# Patient Record
Sex: Male | Born: 1965
Health system: Southern US, Community
[De-identification: ages and names within clinical notes are randomized; demographics above are authoritative.]

## PROBLEM LIST (undated history)

## (undated) DIAGNOSIS — I10 Essential (primary) hypertension: Secondary | ICD-10-CM

## (undated) DIAGNOSIS — M1711 Unilateral primary osteoarthritis, right knee: Secondary | ICD-10-CM

## (undated) DIAGNOSIS — E119 Type 2 diabetes mellitus without complications: Secondary | ICD-10-CM

## (undated) DIAGNOSIS — J45909 Unspecified asthma, uncomplicated: Secondary | ICD-10-CM

## (undated) DIAGNOSIS — M1990 Unspecified osteoarthritis, unspecified site: Secondary | ICD-10-CM

## (undated) HISTORY — PX: CYSTECTOMY: SHX5119

## (undated) HISTORY — PX: OTHER SURGICAL HISTORY: SHX169

## (undated) HISTORY — PX: TONSILLECTOMY: SUR1361

## (undated) HISTORY — PX: SINUS EXPLORATION: SHX5214

## (undated) HISTORY — DX: Unspecified asthma, uncomplicated: J45.909

## (undated) HISTORY — PX: KNEE ARTHROSCOPY W/ MENISCAL REPAIR: SHX1877

---

## 2013-11-09 DIAGNOSIS — J302 Other seasonal allergic rhinitis: Secondary | ICD-10-CM | POA: Insufficient documentation

## 2013-11-09 HISTORY — DX: Other seasonal allergic rhinitis: J30.2

## 2013-12-15 DIAGNOSIS — M25369 Other instability, unspecified knee: Secondary | ICD-10-CM | POA: Insufficient documentation

## 2016-10-21 ENCOUNTER — Ambulatory Visit (INDEPENDENT_AMBULATORY_CARE_PROVIDER_SITE_OTHER): Payer: Self-pay | Admitting: Family Medicine

## 2016-10-21 ENCOUNTER — Encounter: Payer: Self-pay | Admitting: Family Medicine

## 2016-10-21 VITALS — BP 176/98 | HR 93 | Temp 98.1°F | Resp 16 | Ht 76.0 in | Wt 347.0 lb

## 2016-10-21 DIAGNOSIS — R739 Hyperglycemia, unspecified: Secondary | ICD-10-CM | POA: Insufficient documentation

## 2016-10-21 DIAGNOSIS — Z0000 Encounter for general adult medical examination without abnormal findings: Secondary | ICD-10-CM

## 2016-10-21 DIAGNOSIS — R8290 Unspecified abnormal findings in urine: Secondary | ICD-10-CM

## 2016-10-21 DIAGNOSIS — N390 Urinary tract infection, site not specified: Secondary | ICD-10-CM

## 2016-10-21 DIAGNOSIS — I10 Essential (primary) hypertension: Secondary | ICD-10-CM

## 2016-10-21 DIAGNOSIS — Z23 Encounter for immunization: Secondary | ICD-10-CM

## 2016-10-21 DIAGNOSIS — Z9109 Other allergy status, other than to drugs and biological substances: Secondary | ICD-10-CM

## 2016-10-21 DIAGNOSIS — E785 Hyperlipidemia, unspecified: Secondary | ICD-10-CM | POA: Insufficient documentation

## 2016-10-21 DIAGNOSIS — E559 Vitamin D deficiency, unspecified: Secondary | ICD-10-CM | POA: Insufficient documentation

## 2016-10-21 DIAGNOSIS — R7303 Prediabetes: Secondary | ICD-10-CM

## 2016-10-21 LAB — POCT URINALYSIS DIP (DEVICE)
Bilirubin Urine: NEGATIVE
GLUCOSE, UA: NEGATIVE mg/dL
Hgb urine dipstick: NEGATIVE
Ketones, ur: NEGATIVE mg/dL
NITRITE: POSITIVE — AB
PH: 5.5 (ref 5.0–8.0)
PROTEIN: 30 mg/dL — AB
Specific Gravity, Urine: 1.025 (ref 1.005–1.030)
UROBILINOGEN UA: 0.2 mg/dL (ref 0.0–1.0)

## 2016-10-21 LAB — POCT GLYCOSYLATED HEMOGLOBIN (HGB A1C): Hemoglobin A1C: 6.3

## 2016-10-21 MED ORDER — CLONIDINE HCL 0.1 MG PO TABS
0.1000 mg | ORAL_TABLET | Freq: Once | ORAL | Status: AC
  Administered 2016-10-21: 0.1 mg via ORAL

## 2016-10-21 MED ORDER — AMLODIPINE BESYLATE 5 MG PO TABS: 5.0000 mg | ORAL_TABLET | Freq: Every day | ORAL | 2 refills | Status: DC

## 2016-10-21 MED ORDER — CIPROFLOXACIN HCL 250 MG PO TABS: 250.0000 mg | ORAL_TABLET | Freq: Two times a day (BID) | ORAL | 0 refills | Status: DC

## 2016-10-21 MED ORDER — ASPIRIN EC 81 MG PO TBEC: 81.0000 mg | DELAYED_RELEASE_TABLET | Freq: Every day | ORAL | 11 refills | Status: DC

## 2016-10-21 MED ORDER — ATORVASTATIN CALCIUM 20 MG PO TABS: 20.0000 mg | ORAL_TABLET | Freq: Every day | ORAL | 3 refills | Status: DC

## 2016-10-21 MED FILL — CIPROFLOXACIN HCL 250 MG TA: 250 | 7 days supply | Qty: 14 | Fill #0

## 2016-10-21 MED FILL — AMLODIPINE BESYLATE 5 MG TA: 5 | 30 days supply | Qty: 30 | Fill #0

## 2016-10-21 MED FILL — ?ATORVASTATIN 20 MG TABLET: 20 | 30 days supply | Qty: 30 | Fill #0

## 2016-10-21 NOTE — Progress Notes (Signed)
Mr. Elliot DallyDavid Zachman, a 51 year old male, presents to establish care. He says that he has not had a primary provider in greater than 5 years. He has a history of hypertension, hyperlipidemia, and obesity. He was recently evaluated at a pre employment health screening and found to have a markedly elevated blood pressure at 170/94 and his cholesterol was elevated. Patient brought a copy of abnormal laboratory values. He has not been followed by primarily care since relocating due to insurance constraints. He says that he has been out of work and has not been able to afford healthier food options. He maintains that he has gained a considerable amount of weight over the past year .He is not exercising and is not adherent to low salt diet. Mr. [name] does not check blood pressure at home. Patient denies chest pain, dyspnea, fatigue, lower extremity edema, palpitations, syncope and tachypnea.  Cardiovascular risk factors include: dyslipidemia, hypertension, obesity (BMI >= 30 kg/m2) and smoking/ tobacco exposure. He has been a chronic everyday smoker for 30 years.   Past Medical History:  Diagnosis Date  . Asthma    Social History   Social History  . Marital status: Single    Spouse name: N/A  . Number of children: N/A  . Years of education: N/A   Occupational History  . Not on file.   Social History Main Topics  . Smoking status: Former Games developermoker  . Smokeless tobacco: Never Used  . Alcohol use No  . Drug use: No  . Sexual activity: Yes   Other Topics Concern  . Not on file   Social History Narrative  . No narrative on file     Review of Systems  Constitutional:       Mr. [name] reports weight gain  HENT: Negative.   Eyes: Negative.   Respiratory: Negative.   Cardiovascular: Negative.  Negative for chest pain, palpitations and leg swelling.  Gastrointestinal: Negative.   Genitourinary: Negative.  Negative for dysuria, hematuria and urgency.  Musculoskeletal: Negative.   Skin: Negative.    Neurological: Negative.   Endo/Heme/Allergies: Negative.   Psychiatric/Behavioral: Negative.  Negative for depression.    Physical Exam  Constitutional: He is oriented to person, place, and time and well-developed, well-nourished, and in no distress.  Morbid obesity  HENT:  Head: Normocephalic and atraumatic.  Right Ear: External ear normal.  Left Ear: External ear normal.  Nose: Nose normal.  Mouth/Throat: Oropharynx is clear and moist.  Eyes: Conjunctivae and EOM are normal. Pupils are equal, round, and reactive to light.  Neck: Normal range of motion. Neck supple.  Cardiovascular: Normal rate, regular rhythm, normal heart sounds and intact distal pulses.   Pulmonary/Chest: No respiratory distress. He has no wheezes.  Abdominal: Soft. Bowel sounds are normal.  Increased abdominal girth  Musculoskeletal: Normal range of motion.  Neurological: He is alert and oriented to person, place, and time. Gait normal.  Skin: Skin is warm and dry.    Plan   1. Essential hypertension Blood pressure is 188/108 on arrival. Patient was given Clonidine 0.1 mg and blood pressure decreased to 177/98. I will start Amlodipine 5 mg daily. He will return for blood pressure check in 1 week. Recommend a low fat, low sodium diet.  - cloNIDine (CATAPRES) tablet 0.1 mg; Take 1 tablet (0.1 mg total) by mouth once. - amLODipine (NORVASC) 5 MG tablet; Take 1 tablet (5 mg total) by mouth daily.  Dispense: 30 tablet; Refill: 2 - Microalbumin/Creatinine Ratio, Urine - POCT urinalysis  dip (device)  2. Hyperlipidemia LDL goal <100 The patient is asked to make an attempt to improve diet and exercise patterns to aid in medical management of this problem. - atorvastatin (LIPITOR) 20 MG tablet; Take 1 tablet (20 mg total) by mouth daily.  Dispense: 90 tablet; Refill: 3 - aspirin EC 81 MG tablet; Take 1 tablet (81 mg total) by mouth daily.  Dispense: 30 tablet; Refill: 11 - Microalbumin/Creatinine Ratio, Urine  3.  Elevated serum glucose - HgB A1c  4. Prediabetes Recommend a lowfat, low carbohydrate diet divided over 5-6 small meals, increase water intake to 6-8 glasses, and 150 minutes per week of cardiovascular exercise.   - HgB A1c  5. Vitamin D deficiency He has a history of vitamin D deficiency. I will check vitamin D level - cholecalciferol (VITAMIN D) 1000 units tablet; Take 1,000 Units by mouth daily. - Vitamin D 1,25 dihydroxy  6. Need for Tdap vaccination - Tdap vaccine greater than or equal to 7yo IM  7. Morbid obesity (HCC) Recommend a lowfat, low carbohydrate diet divided over 5-6 small meals, increase water intake to 6-8 glasses, and 150 minutes per week of cardiovascular exercise.   - HgB A1c  8. Abnormal urinalysis Patient has positive nitrites and small leukocytes.  - Urine Culture  9. Urinary tract infection without hematuria, site unspecified Abnormal urinalysis. He has positive nitrites and leukocytes. I will treat empirically with broad spectrum antibiotics - Urine Culture - ciprofloxacin (CIPRO) 250 MG tablet; Take 1 tablet (250 mg total) by mouth 2 (two) times daily.  Dispense: 14 tablet; Refill: 0  10. Environmental allergies - Fexofenadine HCl (ALLEGRA ALLERGY PO); Take by mouth.  11. Preventative health care - Ascorbic Acid (VITAMIN C) 100 MG tablet; Take 100 mg by mouth daily.   RTC: Mr. Stickels will return to clinic in 1 week for a blood pressure check and in 1 month for hypertension.   Nolon Nations  MSN, FNP-C [facility] Patient [facility] [address] [place], [place] [address] [phone]

## 2016-10-21 NOTE — Patient Instructions (Addendum)
1. Hyperlipidemia:  Will start low dose statin therapy 20 mg with dinner Aspirin 81 mg daily  2. Hypertension 0.1 mg Clonidine times 1 without complication Amlodipine 5 mg daily 3. Prediabetes Recommend a lowfat, low carbohydrate diet divided over 5-6 small meals, increase water intake to 6-8 glasses, and 150 minutes per week of cardiovascular exercise.   4. Urinary Tract Infection Ciprofloxacin 250 mg twice daily for 7 days Increase water intake to 6-8 glasses per day    DASH Eating Plan DASH stands for "Dietary Approaches to Stop Hypertension." The DASH eating plan is a healthy eating plan that has been shown to reduce high blood pressure (hypertension). It may also reduce your risk for type 2 diabetes, heart disease, and stroke. The DASH eating plan may also help with weight loss. What are tips for following this plan? General guidelines  Avoid eating more than 2,300 mg (milligrams) of salt (sodium) a day. If you have hypertension, you may need to reduce your sodium intake to 1,500 mg a day.  Limit alcohol intake to no more than 1 drink a day for nonpregnant women and 2 drinks a day for men. One drink equals 12 oz of beer, 5 oz of wine, or 1 oz of hard liquor.  Work with your health care provider to maintain a healthy body weight or to lose weight. Ask what an ideal weight is for you.  Get at least 30 minutes of exercise that causes your heart to beat faster (aerobic exercise) most days of the week. Activities may include walking, swimming, or biking.  Work with your health care provider or diet and nutrition specialist (dietitian) to adjust your eating plan to your individual calorie needs. Reading food labels  Check food labels for the amount of sodium per serving. Choose foods with less than 5 percent of the Daily Value of sodium. Generally, foods with less than 300 mg of sodium per serving fit into this eating plan.  To find whole grains, look for the word "whole" as the  first word in the ingredient list. Shopping  Buy products labeled as "low-sodium" or "no salt added."  Buy fresh foods. Avoid canned foods and premade or frozen meals. Cooking  Avoid adding salt when cooking. Use salt-free seasonings or herbs instead of table salt or sea salt. Check with your health care provider or pharmacist before using salt substitutes.  Do not fry foods. Cook foods using healthy methods such as baking, boiling, grilling, and broiling instead.  Cook with heart-healthy oils, such as olive, canola, soybean, or sunflower oil. Meal planning   Eat a balanced diet that includes: ? 5 or more servings of fruits and vegetables each day. At each meal, try to fill half of your plate with fruits and vegetables. ? Up to 6-8 servings of whole grains each day. ? Less than 6 oz of lean meat, poultry, or fish each day. A 3-oz serving of meat is about the same size as a deck of cards. One egg equals 1 oz. ? 2 servings of low-fat dairy each day. ? A serving of nuts, seeds, or beans 5 times each week. ? Heart-healthy fats. Healthy fats called Omega-3 fatty acids are found in foods such as flaxseeds and coldwater fish, like sardines, salmon, and mackerel.  Limit how much you eat of the following: ? Canned or prepackaged foods. ? Food that is high in trans fat, such as fried foods. ? Food that is high in saturated fat, such as fatty meat. ?  Sweets, desserts, sugary drinks, and other foods with added sugar. ? Full-fat dairy products.  Do not salt foods before eating.  Try to eat at least 2 vegetarian meals each week.  Eat more home-cooked food and less restaurant, buffet, and fast food.  When eating at a restaurant, ask that your food be prepared with less salt or no salt, if possible. What foods are recommended? The items listed may not be a complete list. Talk with your dietitian about what dietary choices are best for you. Grains Whole-grain or whole-wheat bread. Whole-grain  or whole-wheat pasta. Brown rice. [place]. Bulgur. Whole-grain and low-sodium cereals. Pita bread. Low-fat, low-sodium crackers. Whole-wheat flour tortillas. Vegetables Fresh or frozen vegetables (raw, steamed, roasted, or grilled). Low-sodium or reduced-sodium tomato and vegetable juice. Low-sodium or reduced-sodium tomato sauce and tomato paste. Low-sodium or reduced-sodium canned vegetables. Fruits All fresh, dried, or frozen fruit. Canned fruit in natural juice (without added sugar). Meat and other protein foods Skinless chicken or [place]. Ground chicken or [place]. Pork with fat trimmed off. Fish and seafood. Egg whites. Dried beans, peas, or lentils. Unsalted nuts, nut butters, and seeds. Unsalted canned beans. Lean cuts of beef with fat trimmed off. Low-sodium, lean deli meat. Dairy Low-fat (1%) or fat-free (skim) milk. Fat-free, low-fat, or reduced-fat cheeses. Nonfat, low-sodium ricotta or cottage cheese. Low-fat or nonfat yogurt. Low-fat, low-sodium cheese. Fats and oils Soft margarine without trans fats. Vegetable oil. Low-fat, reduced-fat, or light mayonnaise and salad dressings (reduced-sodium). Canola, safflower, olive, soybean, and sunflower oils. Avocado. Seasoning and other foods Herbs. Spices. Seasoning mixes without salt. Unsalted popcorn and pretzels. Fat-free sweets. What foods are not recommended? The items listed may not be a complete list. Talk with your dietitian about what dietary choices are best for you. Grains Baked goods made with fat, such as croissants, muffins, or some breads. Dry pasta or rice meal packs. Vegetables Creamed or fried vegetables. Vegetables in a cheese sauce. Regular canned vegetables (not low-sodium or reduced-sodium). Regular canned tomato sauce and paste (not low-sodium or reduced-sodium). Regular tomato and vegetable juice (not low-sodium or reduced-sodium). Angie Fava. Olives. Fruits Canned fruit in a light or heavy syrup. Fried fruit.  Fruit in cream or butter sauce. Meat and other protein foods Fatty cuts of meat. Ribs. Fried meat. Berniece Salines. Sausage. Bologna and other processed lunch meats. Salami. Fatback. Hotdogs. Bratwurst. Salted nuts and seeds. Canned beans with added salt. Canned or smoked fish. Whole eggs or egg yolks. Chicken or [place] with skin. Dairy Whole or 2% milk, cream, and half-and-half. Whole or full-fat cream cheese. Whole-fat or sweetened yogurt. Full-fat cheese. Nondairy creamers. Whipped toppings. Processed cheese and cheese spreads. Fats and oils Butter. Stick margarine. Lard. Shortening. Ghee. Bacon fat. Tropical oils, such as coconut, palm kernel, or palm oil. Seasoning and other foods Salted popcorn and pretzels. Onion salt, garlic salt, seasoned salt, table salt, and sea salt. Worcestershire sauce. Tartar sauce. Barbecue sauce. Teriyaki sauce. Soy sauce, including reduced-sodium. Steak sauce. Canned and packaged gravies. Fish sauce. Oyster sauce. Cocktail sauce. Horseradish that you find on the shelf. Ketchup. Mustard. Meat flavorings and tenderizers. Bouillon cubes. Hot sauce and Tabasco sauce. Premade or packaged marinades. Premade or packaged taco seasonings. Relishes. Regular salad dressings. Where to find more information:  National Heart, Lung, and [place]: https://wilson-eaton.com/  American Heart Association: www.heart.org Summary  The DASH eating plan is a healthy eating plan that has been shown to reduce high blood pressure (hypertension). It may also reduce your risk for type 2 diabetes,  heart disease, and stroke.  With the DASH eating plan, you should limit salt (sodium) intake to 2,300 mg a day. If you have hypertension, you may need to reduce your sodium intake to 1,500 mg a day.  When on the DASH eating plan, aim to eat more fresh fruits and vegetables, whole grains, lean proteins, low-fat dairy, and heart-healthy fats.  Work with your health care provider or diet and nutrition  specialist (dietitian) to adjust your eating plan to your individual calorie needs. This information is not intended to replace advice given to you by your health care provider. Make sure you discuss any questions you have with your health care provider. Document Released: 03/27/2011 Document Revised: 03/31/2016 Document Reviewed: 03/31/2016 Elsevier Interactive Patient Education  2017 [employer] Dyslipidemia Dyslipidemia is an imbalance of waxy, fat-like substances (lipids) in the blood. The body needs lipids in small amounts. Dyslipidemia often involves a high level of cholesterol or triglycerides, which are types of lipids. Common forms of dyslipidemia include:  High levels of bad cholesterol (LDL cholesterol). LDL is the type of cholesterol that causes fatty deposits (plaques) to build up in the blood vessels that carry blood away from your heart (arteries).  Low levels of good cholesterol (HDL cholesterol). HDL cholesterol is the type of cholesterol that protects against heart disease. High levels of HDL remove the LDL buildup from arteries.  High levels of triglycerides. Triglycerides are a fatty substance in the blood that is linked to a buildup of plaques in the arteries.  You can develop dyslipidemia because of the genes you are born with (primary dyslipidemia) or changes that occur during your life (secondary dyslipidemia), or as a side effect of certain medical treatments. What are the causes? Primary dyslipidemia is caused by changes (mutations) in genes that are passed down through families (inherited). These mutations cause several types of dyslipidemia. Mutations can result in disorders that make the body produce too much LDL cholesterol or triglycerides, or not enough HDL cholesterol. These disorders may lead to heart disease, arterial disease, or stroke at an early age. Causes of secondary dyslipidemia include certain lifestyle choices and diseases that lead to dyslipidemia,  such as:  Eating a diet that is high in animal fat.  Not getting enough activity or exercise (having a sedentary lifestyle).  Having diabetes, kidney disease, liver disease, or thyroid disease.  Drinking large amounts of alcohol.  Using certain types of drugs.  What increases the risk? You may be at greater risk for dyslipidemia if you are an older man or if you are a woman who has gone through menopause. Other risk factors include:  Having a family history of dyslipidemia.  Taking certain medicines, including birth control pills, steroids, some diuretics, beta-blockers, and some medicines forHIV.  Smoking cigarettes.  Eating a high-fat diet.  Drinking large amounts of alcohol.  Having certain medical conditions such as diabetes, polycystic ovary syndrome (PCOS), pregnancy, kidney disease, liver disease, or hypothyroidism.  Not exercising regularly.  Being overweight or obese with too much belly fat.  What are the signs or symptoms? Dyslipidemia does not usually cause any symptoms. Very high lipid levels can cause fatty bumps under the skin (xanthomas) or a white or gray ring around the black center (pupil) of the eye. Very high triglyceride levels can cause inflammation of the pancreas (pancreatitis). How is this diagnosed? Your health care provider may diagnose dyslipidemia based on a routine blood test (fasting blood test). Because most people do not have symptoms of the  condition, this blood testing (lipid profile) is done on adults age 32 and older and is repeated every 5 years. This test checks:  Total cholesterol. This is a measure of the total amount of cholesterol in your blood, including LDL cholesterol, HDL cholesterol, and triglycerides. A healthy number is below 200.  LDL cholesterol. The target number for LDL cholesterol is different for each person, depending on individual risk factors. For most people, a number below 100 is healthy. Ask your health care  provider what your LDL cholesterol number should be.  HDL cholesterol. An HDL level of 60 or higher is best because it helps to protect against heart disease. A number below 40 for men or below 50 for women increases the risk for heart disease.  Triglycerides. A healthy triglyceride number is below 150.  If your lipid profile is abnormal, your health care provider may do other blood tests to get more information about your condition. How is this treated? Treatment depends on the type of dyslipidemia that you have and your other risk factors for heart disease and stroke. Your health care provider will have a target range for your lipid levels based on this information. For many people, treatment starts with lifestyle changes, such as diet and exercise. Your health care provider may recommend that you:  Get regular exercise.  Make changes to your diet.  Quit smoking if you smoke.  If diet changes and exercise do not help you reach your goals, your health care provider may also prescribe medicine to lower lipids. The most commonly prescribed type of medicine lowers your LDL cholesterol (statin drug). If you have a high triglyceride level, your provider may prescribe another type of drug (fibrate) or an omega-3 fish oil supplement, or both. Follow these instructions at home:  Take over-the-counter and prescription medicines only as told by your health care provider. This includes supplements.  Get regular exercise. Start an aerobic exercise and strength training program as told by your health care provider. Ask your health care provider what activities are safe for you. Your health care provider may recommend: ? 30 minutes of aerobic activity 4-6 days a week. Brisk walking is an example of aerobic activity. ? Strength training 2 days a week.  Eat a healthy diet as told by your health care provider. This can help you reach and maintain a healthy weight, lower your LDL cholesterol, and raise your  HDL cholesterol. It may help to work with a diet and nutrition specialist (dietitian) to make a plan that is right for you. Your dietitian or health care provider may recommend: ? Limiting your calories, if you are overweight. ? Eating more fruits, vegetables, whole grains, fish, and lean meats. ? Limiting saturated fat, trans fat, and cholesterol.  Follow instructions from your health care provider or dietitian about eating or drinking restrictions.  Limit alcohol intake to no more than one drink per day for nonpregnant women and two drinks per day for men. One drink equals 12 oz of beer, 5 oz of wine, or 1 oz of hard liquor.  Do not use any products that contain nicotine or tobacco, such as cigarettes and e-cigarettes. If you need help quitting, ask your health care provider.  Keep all follow-up visits as told by your health care provider. This is important. Contact a health care provider if:  You are having trouble sticking to your exercise or diet plan.  You are struggling to quit smoking or control your use of alcohol. Summary  Dyslipidemia is an imbalance of waxy, fat-like substances (lipids) in the blood. The body needs lipids in small amounts. Dyslipidemia often involves a high level of cholesterol or triglycerides, which are types of lipids.  Treatment depends on the type of dyslipidemia that you have and your other risk factors for heart disease and stroke.  For many people, treatment starts with lifestyle changes, such as diet and exercise. Your health care provider may also prescribe medicine to lower lipids. This information is not intended to replace advice given to you by your health care provider. Make sure you discuss any questions you have with your health care provider. Document Released: 04/12/2013 Document Revised: 12/03/2015 Document Reviewed: 12/03/2015 Elsevier Interactive Patient Education  2018 [employer].     Prediabetes Prediabetes is the condition of  having a blood sugar (blood glucose) level that is higher than it should be, but not high enough for you to be diagnosed with type 2 diabetes. Having prediabetes puts you at risk for developing type 2 diabetes (type 2 diabetes mellitus). Prediabetes may be called impaired glucose tolerance or impaired fasting glucose. Prediabetes usually does not cause symptoms. Your health care provider can diagnose this condition with blood tests. You may be tested for prediabetes if you are overweight and if you have at least one other risk factor for prediabetes. Risk factors for prediabetes include:  Having a family member with type 2 diabetes.  Being overweight or obese.  Being older than age 2.  Being of American-Indian, African-American, Hispanic/Latino, or Asian/Pacific Islander descent.  Having an inactive (sedentary) lifestyle.  Having a history of gestational diabetes or polycystic ovarian syndrome (PCOS).  Having low levels of good cholesterol (HDL-C) or high levels of blood fats (triglycerides).  Having high blood pressure.  What is blood glucose and how is blood glucose measured?  Blood glucose refers to the amount of glucose in your bloodstream. Glucose comes from eating foods that contain sugars and starches (carbohydrates) that the body breaks down into glucose. Your blood glucose level may be measured in mg/dL (milligrams per deciliter) or mmol/L (millimoles per liter).Your blood glucose may be checked with one or more of the following blood tests:  A fasting blood glucose (FBG) test. You will not be allowed to eat (you will fast) for at least 8 hours before a blood sample is taken. ? A normal range for FBG is 70-100 mg/dl (6.9-6.2 mmol/L).  An A1c (hemoglobin A1c) blood test. This test provides information about blood glucose control over the previous 2?3months.  An oral glucose tolerance test (OGTT). This test measures your blood glucose twice: ? After fasting. This is your  baseline level. ? Two hours after you drink a beverage that contains glucose.  You may be diagnosed with prediabetes:  If your FBG is 100?125 mg/dL (9.5-2.8 mmol/L).  If your A1c level is 5.7?6.4%.  If your OGGT result is 140?199 mg/dL (4.1-32 mmol/L).  These blood tests may be repeated to confirm your diagnosis. What happens if blood glucose is too high? The pancreas produces a hormone (insulin) that helps move glucose from the bloodstream into cells. When cells in the body do not respond properly to insulin that the body makes (insulin resistance), excess glucose builds up in the blood instead of going into cells. As a result, high blood glucose (hyperglycemia) can develop, which can cause many complications. This is a symptom of prediabetes. What can happen if blood glucose stays higher than normal for a long time? Having high blood glucose  for a long time is dangerous. Too much glucose in your blood can damage your nerves and blood vessels. Long-term damage can lead to complications from diabetes, which may include:  Heart disease.  Stroke.  Blindness.  Kidney disease.  Depression.  Poor circulation in the feet and legs, which could lead to surgical removal (amputation) in severe cases.  How can prediabetes be prevented from turning into type 2 diabetes?  To help prevent type 2 diabetes, take the following actions:  Be physically active. ? Do moderate-intensity physical activity for at least 30 minutes on at least 5 days of the week, or as much as told by your health care provider. This could be brisk walking, biking, or water aerobics. ? Ask your health care provider what activities are safe for you. A mix of physical activities may be best, such as walking, swimming, cycling, and strength training.  Lose weight as told by your health care provider. ? Losing 5-7% of your body weight can reverse insulin resistance. ? Your health care provider can determine how much weight  loss is best for you and can help you lose weight safely.  Follow a healthy meal plan. This includes eating lean proteins, complex carbohydrates, fresh fruits and vegetables, low-fat dairy products, and healthy fats. ? Follow instructions from your health care provider about eating or drinking restrictions. ? Make an appointment to see a diet and nutrition specialist (registered dietitian) to help you create a healthy eating plan that is right for you.  Do not smoke or use any tobacco products, such as cigarettes, chewing tobacco, and e-cigarettes. If you need help quitting, ask your health care provider.  Take over-the-counter and prescription medicines as told by your health care provider. You may be prescribed medicines that help lower the risk of type 2 diabetes.  This information is not intended to replace advice given to you by your health care provider. Make sure you discuss any questions you have with your health care provider. Document Released: 07/30/2015 Document Revised: 09/13/2015 Document Reviewed: 05/29/2015 Elsevier Interactive Patient Education  [employer].

## 2016-10-24 ENCOUNTER — Ambulatory Visit: Payer: Self-pay

## 2016-10-24 VITALS — BP 146/98

## 2016-10-24 DIAGNOSIS — I10 Essential (primary) hypertension: Secondary | ICD-10-CM

## 2016-10-24 LAB — VITAMIN D 1,25 DIHYDROXY
VITAMIN D3 1, 25 (OH): 47 pg/mL
Vitamin D 1, 25 (OH)2 Total: 47 pg/mL (ref 18–72)

## 2016-10-24 LAB — MICROALBUMIN / CREATININE URINE RATIO
CREATININE, URINE: 170 mg/dL (ref 20–370)
Microalb Creat Ratio: 33 mcg/mg creat — ABNORMAL HIGH (ref <30)
Microalb, Ur: 5.6 mg/dL

## 2016-10-24 LAB — URINE CULTURE

## 2016-10-31 ENCOUNTER — Other Ambulatory Visit: Payer: Self-pay | Admitting: Family Medicine

## 2016-10-31 DIAGNOSIS — I10 Essential (primary) hypertension: Secondary | ICD-10-CM

## 2016-10-31 MED ORDER — AMLODIPINE BESYLATE 5 MG PO TABS: 10.0000 mg | ORAL_TABLET | Freq: Every day | ORAL | 2 refills | Status: DC

## 2016-10-31 NOTE — Progress Notes (Signed)
Meds ordered this encounter  Medications  . amLODipine (NORVASC) 5 MG tablet    Sig: Take 2 tablets (10 mg total) by mouth daily.    Dispense:  30 tablet    Refill:  2      Scott NationsLaChina Moore [name]  MSN, FNP-C [facility] [address]  [place], [place] [id number] [phone]

## 2016-11-06 MED FILL — ?AMLODIPINE BESYLATE 10 MG: 10 | 30 days supply | Qty: 30 | Fill #0

## 2016-11-17 MED FILL — ?ATORVASTATIN 20 MG TABLET: 20 | 30 days supply | Qty: 30 | Fill #1

## 2016-11-21 ENCOUNTER — Encounter: Payer: Self-pay | Admitting: Family Medicine

## 2016-11-21 ENCOUNTER — Ambulatory Visit (INDEPENDENT_AMBULATORY_CARE_PROVIDER_SITE_OTHER): Payer: PRIVATE HEALTH INSURANCE | Admitting: Family Medicine

## 2016-11-21 VITALS — BP 148/98 | HR 105 | Temp 98.0°F | Resp 16 | Ht 76.0 in | Wt 347.0 lb

## 2016-11-21 DIAGNOSIS — I10 Essential (primary) hypertension: Secondary | ICD-10-CM | POA: Diagnosis not present

## 2016-11-21 DIAGNOSIS — Z9109 Other allergy status, other than to drugs and biological substances: Secondary | ICD-10-CM

## 2016-11-21 DIAGNOSIS — J069 Acute upper respiratory infection, unspecified: Secondary | ICD-10-CM | POA: Diagnosis not present

## 2016-11-21 DIAGNOSIS — E785 Hyperlipidemia, unspecified: Secondary | ICD-10-CM | POA: Diagnosis not present

## 2016-11-21 DIAGNOSIS — R062 Wheezing: Secondary | ICD-10-CM | POA: Diagnosis not present

## 2016-11-21 MED ORDER — ATORVASTATIN CALCIUM 20 MG PO TABS: 20.0000 mg | ORAL_TABLET | Freq: Every day | ORAL | 3 refills | Status: DC

## 2016-11-21 MED ORDER — LEVOCETIRIZINE DIHYDROCHLORIDE 5 MG PO TABS: 5.0000 mg | ORAL_TABLET | Freq: Every evening | ORAL | 11 refills | Status: DC

## 2016-11-21 MED ORDER — AMLODIPINE BESYLATE 10 MG PO TABS: 10.0000 mg | ORAL_TABLET | Freq: Every day | ORAL | 5 refills | Status: DC

## 2016-11-21 MED ORDER — AZITHROMYCIN 250 MG PO TABS: ORAL_TABLET | ORAL | 0 refills | Status: DC

## 2016-11-21 MED ORDER — HYDROCHLOROTHIAZIDE 12.5 MG PO TABS: 12.5000 mg | ORAL_TABLET | Freq: Every day | ORAL | 3 refills | Status: DC

## 2016-11-21 MED ORDER — ALBUTEROL SULFATE HFA 108 (90 BASE) MCG/ACT IN AERS: 2.0000 | INHALATION_SPRAY | Freq: Four times a day (QID) | RESPIRATORY_TRACT | 0 refills | Status: DC | PRN

## 2016-11-21 NOTE — Patient Instructions (Addendum)
Upper respiratory infection:  Azithromycin- 500 mg on day 1, 250 mg on days 2-5.  Xyzal 5 mg at bedtime Continue Nasonex 2 sprays to each nare daily  Essential hypertension: Blood pressure is not controlled on current medication regimen. Will add Hydrochlorothiazide 12.5 mg daily.  Will continue Amlodipine 10 mg daily     DASH Eating Plan DASH stands for "Dietary Approaches to Stop Hypertension." The DASH eating plan is a healthy eating plan that has been shown to reduce high blood pressure (hypertension). It may also reduce your risk for type 2 diabetes, heart disease, and stroke. The DASH eating plan may also help with weight loss. What are tips for following this plan? General guidelines  Avoid eating more than 2,300 mg (milligrams) of salt (sodium) a day. If you have hypertension, you may need to reduce your sodium intake to 1,500 mg a day.  Limit alcohol intake to no more than 1 drink a day for nonpregnant women and 2 drinks a day for men. One drink equals 12 oz of beer, 5 oz of wine, or 1 oz of hard liquor.  Work with your health care provider to maintain a healthy body weight or to lose weight. Ask what an ideal weight is for you.  Get at least 30 minutes of exercise that causes your heart to beat faster (aerobic exercise) most days of the week. Activities may include walking, swimming, or biking.  Work with your health care provider or diet and nutrition specialist (dietitian) to adjust your eating plan to your individual calorie needs. Reading food labels  Check food labels for the amount of sodium per serving. Choose foods with less than 5 percent of the Daily Value of sodium. Generally, foods with less than 300 mg of sodium per serving fit into this eating plan.  To find whole grains, look for the word "whole" as the first word in the ingredient list. Shopping  Buy products labeled as "low-sodium" or "no salt added."  Buy fresh foods. Avoid canned foods and premade or  frozen meals. Cooking  Avoid adding salt when cooking. Use salt-free seasonings or herbs instead of table salt or sea salt. Check with your health care provider or pharmacist before using salt substitutes.  Do not fry foods. Cook foods using healthy methods such as baking, boiling, grilling, and broiling instead.  Cook with heart-healthy oils, such as olive, canola, soybean, or sunflower oil. Meal planning   Eat a balanced diet that includes: ? 5 or more servings of fruits and vegetables each day. At each meal, try to fill half of your plate with fruits and vegetables. ? Up to 6-8 servings of whole grains each day. ? Less than 6 oz of lean meat, poultry, or fish each day. A 3-oz serving of meat is about the same size as a deck of cards. One egg equals 1 oz. ? 2 servings of low-fat dairy each day. ? A serving of nuts, seeds, or beans 5 times each week. ? Heart-healthy fats. Healthy fats called Omega-3 fatty acids are found in foods such as flaxseeds and coldwater fish, like sardines, salmon, and mackerel.  Limit how much you eat of the following: ? Canned or prepackaged foods. ? Food that is high in trans fat, such as fried foods. ? Food that is high in saturated fat, such as fatty meat. ? Sweets, desserts, sugary drinks, and other foods with added sugar. ? Full-fat dairy products.  Do not salt foods before eating.  Try to eat  at least 2 vegetarian meals each week.  Eat more home-cooked food and less restaurant, buffet, and fast food.  When eating at a restaurant, ask that your food be prepared with less salt or no salt, if possible. What foods are recommended? The items listed may not be a complete list. Talk with your dietitian about what dietary choices are best for you. Grains Whole-grain or whole-wheat bread. Whole-grain or whole-wheat pasta. Brown rice. [place]. Bulgur. Whole-grain and low-sodium cereals. Pita bread. Low-fat, low-sodium crackers. Whole-wheat flour  tortillas. Vegetables Fresh or frozen vegetables (raw, steamed, roasted, or grilled). Low-sodium or reduced-sodium tomato and vegetable juice. Low-sodium or reduced-sodium tomato sauce and tomato paste. Low-sodium or reduced-sodium canned vegetables. Fruits All fresh, dried, or frozen fruit. Canned fruit in natural juice (without added sugar). Meat and other protein foods Skinless chicken or [place]. Ground chicken or [place]. Pork with fat trimmed off. Fish and seafood. Egg whites. Dried beans, peas, or lentils. Unsalted nuts, nut butters, and seeds. Unsalted canned beans. Lean cuts of beef with fat trimmed off. Low-sodium, lean deli meat. Dairy Low-fat (1%) or fat-free (skim) milk. Fat-free, low-fat, or reduced-fat cheeses. Nonfat, low-sodium ricotta or cottage cheese. Low-fat or nonfat yogurt. Low-fat, low-sodium cheese. Fats and oils Soft margarine without trans fats. Vegetable oil. Low-fat, reduced-fat, or light mayonnaise and salad dressings (reduced-sodium). Canola, safflower, olive, soybean, and sunflower oils. Avocado. Seasoning and other foods Herbs. Spices. Seasoning mixes without salt. Unsalted popcorn and pretzels. Fat-free sweets. What foods are not recommended? The items listed may not be a complete list. Talk with your dietitian about what dietary choices are best for you. Grains Baked goods made with fat, such as croissants, muffins, or some breads. Dry pasta or rice meal packs. Vegetables Creamed or fried vegetables. Vegetables in a cheese sauce. Regular canned vegetables (not low-sodium or reduced-sodium). Regular canned tomato sauce and paste (not low-sodium or reduced-sodium). Regular tomato and vegetable juice (not low-sodium or reduced-sodium). Angie Fava. Olives. Fruits Canned fruit in a light or heavy syrup. Fried fruit. Fruit in cream or butter sauce. Meat and other protein foods Fatty cuts of meat. Ribs. Fried meat. Berniece Salines. Sausage. Bologna and other processed lunch meats.  Salami. Fatback. Hotdogs. Bratwurst. Salted nuts and seeds. Canned beans with added salt. Canned or smoked fish. Whole eggs or egg yolks. Chicken or [place] with skin. Dairy Whole or 2% milk, cream, and half-and-half. Whole or full-fat cream cheese. Whole-fat or sweetened yogurt. Full-fat cheese. Nondairy creamers. Whipped toppings. Processed cheese and cheese spreads. Fats and oils Butter. Stick margarine. Lard. Shortening. Ghee. Bacon fat. Tropical oils, such as coconut, palm kernel, or palm oil. Seasoning and other foods Salted popcorn and pretzels. Onion salt, garlic salt, seasoned salt, table salt, and sea salt. Worcestershire sauce. Tartar sauce. Barbecue sauce. Teriyaki sauce. Soy sauce, including reduced-sodium. Steak sauce. Canned and packaged gravies. Fish sauce. Oyster sauce. Cocktail sauce. Horseradish that you find on the shelf. Ketchup. Mustard. Meat flavorings and tenderizers. Bouillon cubes. Hot sauce and Tabasco sauce. Premade or packaged marinades. Premade or packaged taco seasonings. Relishes. Regular salad dressings. Where to find more information:  National Heart, Lung, and [place]: https://wilson-eaton.com/  American Heart Association: www.heart.org Summary  The DASH eating plan is a healthy eating plan that has been shown to reduce high blood pressure (hypertension). It may also reduce your risk for type 2 diabetes, heart disease, and stroke.  With the DASH eating plan, you should limit salt (sodium) intake to 2,300 mg a day. If you have hypertension,  you may need to reduce your sodium intake to 1,500 mg a day.  When on the DASH eating plan, aim to eat more fresh fruits and vegetables, whole grains, lean proteins, low-fat dairy, and heart-healthy fats.  Work with your health care provider or diet and nutrition specialist (dietitian) to adjust your eating plan to your individual calorie needs. This information is not intended to replace advice given to you by your health  care provider. Make sure you discuss any questions you have with your health care provider. Document Released: 03/27/2011 Document Revised: 03/31/2016 Document Reviewed: 03/31/2016 Elsevier Interactive Patient Education  2017 [employer]

## 2016-11-21 NOTE — Progress Notes (Signed)
Scott DallyDavid Buchanan, a 51 year old male, presents for a 1 months follow up of hypertension. He has a history of hypertension, hyperlipidemia, and obesity. Scott Buchanan was started on amlodipine 5 mg 1 month ago, which was increased to 10 mg after 2 weeks. He did not start a routine exercise regimen or follow a low fat, low carbohydrate diet. He does not check blood pressure at home Patient denies chest pain, dyspnea, fatigue, lower extremity edema, palpitations, syncope and tachypnea.  Cardiovascular risk factors include: dyslipidemia, hypertension, obesity (BMI >= 30 kg/m2) and smoking/ tobacco exposure. He recently quit smoking, but his roommate continues to smoke inside of the house.  He is also complaining of upper respiratory symptoms over the past 2 weeks. He complains of sinus pain, nasal congestion, persistent cough and wheezing. He has been taking Alegra and Fluticasone without sustained relief. He denies fever or shortness of breath. He does not have close contacts with similar symptoms.   Past Medical History:  Diagnosis Date  . Asthma    Social History   Social History  . Marital status: Single    Spouse name: N/A  . Number of children: N/A  . Years of education: N/A   Occupational History  . Not on file.   Social History Main Topics  . Smoking status: Former Games developermoker  . Smokeless tobacco: Never Used  . Alcohol use No  . Drug use: No  . Sexual activity: Yes   Other Topics Concern  . Not on file   Social History Narrative  . No narrative on file     Review of Systems  HENT: Negative.   Eyes: Negative.   Respiratory: Positive for cough, sputum production and wheezing.   Cardiovascular: Negative.  Negative for chest pain, palpitations and leg swelling.  Gastrointestinal: Negative.   Genitourinary: Negative.  Negative for dysuria, hematuria and urgency.  Musculoskeletal: Negative.   Skin: Negative.   Neurological: Negative.  Negative for dizziness and headaches.   Endo/Heme/Allergies: Negative.   Psychiatric/Behavioral: Negative.  Negative for depression.    Physical Exam  Constitutional: He is oriented to person, place, and time and well-developed, well-nourished, and in no distress.  Morbid obesity  HENT:  Head: Normocephalic and atraumatic.  Right Ear: External ear normal.  Left Ear: External ear normal.  Nose: Right sinus exhibits frontal sinus tenderness. Left sinus exhibits frontal sinus tenderness.  Mouth/Throat: Oropharynx is clear and moist.  Eyes: Pupils are equal, round, and reactive to light. Conjunctivae and EOM are normal.  Neck: Normal range of motion. Neck supple.  Cardiovascular: Normal rate, regular rhythm, normal heart sounds and intact distal pulses.   Pulmonary/Chest: No respiratory distress. He has wheezes in the right upper field.  Abdominal: Soft. Bowel sounds are normal.  Increased abdominal girth  Musculoskeletal: Normal range of motion.  Neurological: He is alert and oriented to person, place, and time. Gait normal.  Skin: Skin is warm and dry.    Plan  1. Essential hypertension Blood pressure is above goal on current medication regimen. Will add thiazide diuretic to medication regimen. Will check bp in 1 week and follow up on hypertension in 1 month.  - amLODipine (NORVASC) 10 MG tablet; Take 1 tablet (10 mg total) by mouth daily.  Dispense: 30 tablet; Refill: 5 - BASIC METABOLIC PANEL WITH GFR - hydrochlorothiazide (HYDRODIURIL) 12.5 MG tablet; Take 1 tablet (12.5 mg total) by mouth daily.  Dispense: 90 tablet; Refill: 3  2. Allergy to environmental factors Will discontinue Allegra and add  xyzal to medication regimen.  - levocetirizine (XYZAL) 5 MG tablet; Take 1 tablet (5 mg total) by mouth every evening.  Dispense: 30 tablet; Refill: 11  3. Hyperlipidemia LDL goal <100 The patient is asked to make an attempt to improve diet and exercise patterns to aid in medical management of this problem.  - atorvastatin  (LIPITOR) 20 MG tablet; Take 1 tablet (20 mg total) by mouth daily.  Dispense: 90 tablet; Refill: 3  4. Upper respiratory tract infection, unspecified type Upper respiratory symptoms have been present for 2 weeks. I suspect that Scott Buchanan may have an upper respiratory infection. Will start Azithromycin.  - azithromycin (ZITHROMAX) 250 MG tablet; 500 mg day 1, Days 2-5 250 mg  Dispense: 6 tablet; Refill: 0  5. Wheezing - albuterol (PROVENTIL HFA;VENTOLIN HFA) 108 (90 Base) MCG/ACT inhaler; Inhale 2 puffs into the lungs every 6 (six) hours as needed for wheezing or shortness of breath.  Dispense: 1 Inhaler; Refill: 0   RTC: 1 week for bp check and 1 month for hypertension   Scott NationsLaChina Moore Trask Vosler  MSN, FNP-C [facility] [address]  [place], [place] [id number] [phone]

## 2016-11-22 LAB — BASIC METABOLIC PANEL WITH GFR
BUN: 16 mg/dL (ref 7–25)
CHLORIDE: 106 mmol/L (ref 98–110)
CO2: 18 mmol/L — ABNORMAL LOW (ref 20–31)
Calcium: 10.4 mg/dL — ABNORMAL HIGH (ref 8.6–10.3)
Creat: 0.94 mg/dL (ref 0.60–1.35)
GFR, Est Non African American: >89 mL/min (ref >=60)
Glucose, Bld: 104 mg/dL — ABNORMAL HIGH (ref 65–99)
POTASSIUM: 3.9 mmol/L (ref 3.5–5.3)
SODIUM: 140 mmol/L (ref 135–146)

## 2016-11-28 ENCOUNTER — Other Ambulatory Visit: Payer: Self-pay | Admitting: Family Medicine

## 2016-11-28 ENCOUNTER — Ambulatory Visit (INDEPENDENT_AMBULATORY_CARE_PROVIDER_SITE_OTHER): Payer: PRIVATE HEALTH INSURANCE

## 2016-11-28 DIAGNOSIS — I10 Essential (primary) hypertension: Secondary | ICD-10-CM

## 2016-11-28 MED ORDER — HYDROCHLOROTHIAZIDE 25 MG PO TABS: 25.0000 mg | ORAL_TABLET | Freq: Every day | ORAL | 1 refills | Status: DC

## 2016-11-28 NOTE — Progress Notes (Signed)
Scott Buchanan, a [phone]

## 2016-11-29 LAB — THYROID PANEL WITH TSH
Free Thyroxine Index: 2 (ref 1.4–3.8)
T3 UPTAKE: 30 % (ref 22–35)
T4 TOTAL: 6.5 ug/dL (ref 4.9–10.5)
TSH: 1.03 m[IU]/L (ref 0.40–4.50)

## 2016-12-12 ENCOUNTER — Ambulatory Visit (INDEPENDENT_AMBULATORY_CARE_PROVIDER_SITE_OTHER): Payer: PRIVATE HEALTH INSURANCE | Admitting: Family Medicine

## 2016-12-12 ENCOUNTER — Encounter: Payer: Self-pay | Admitting: Family Medicine

## 2016-12-12 VITALS — BP 140/76 | HR 100 | Temp 97.9°F | Resp 16 | Ht 76.0 in | Wt 341.0 lb

## 2016-12-12 DIAGNOSIS — I10 Essential (primary) hypertension: Secondary | ICD-10-CM

## 2016-12-12 DIAGNOSIS — J0100 Acute maxillary sinusitis, unspecified: Secondary | ICD-10-CM

## 2016-12-12 DIAGNOSIS — K219 Gastro-esophageal reflux disease without esophagitis: Secondary | ICD-10-CM | POA: Diagnosis not present

## 2016-12-12 DIAGNOSIS — H6502 Acute serous otitis media, left ear: Secondary | ICD-10-CM | POA: Diagnosis not present

## 2016-12-12 MED ORDER — OMEPRAZOLE 20 MG PO CPDR: 20.0000 mg | DELAYED_RELEASE_CAPSULE | Freq: Every day | ORAL | 0 refills | Status: DC

## 2016-12-12 MED ORDER — AMOXICILLIN-POT CLAVULANATE 875-125 MG PO TABS: 1.0000 | ORAL_TABLET | Freq: Two times a day (BID) | ORAL | 0 refills | Status: AC

## 2016-12-12 MED ORDER — AMOXICILLIN-POT CLAVULANATE 875-125 MG PO TABS: 1.0000 | ORAL_TABLET | Freq: Two times a day (BID) | ORAL | 0 refills | Status: DC

## 2016-12-12 NOTE — Progress Notes (Signed)
Subjective:     Scott Buchanan is a 51 y.o. male who presents for evaluation of chronic sinus problems. Symptoms include cough, facial pain, facial pressure, facial swelling, malaise and rhinorrhea. Patient has had approximately 1 acute sinus infections in the past year. Patient reports a history of allergic rhinitis. Patient reports chronic use of OTC nasal decongestant sprays. Previous treatment has included nasal saline sprays/rinses, nasal steroids, prescription antihistamines, topical decongestant sprays and sinus surgery, which have given moderate relief. Patient is also a former smoker.  Past Medical History:  Diagnosis Date  . Asthma    Social History   Social History  . Marital status: Single    Spouse name: N/A  . Number of children: N/A  . Years of education: N/A   Occupational History  . Not on file.   Social History Main Topics  . Smoking status: Former Games developer  . Smokeless tobacco: Never Used  . Alcohol use No  . Drug use: No  . Sexual activity: Yes   Other Topics Concern  . Not on file   Social History Narrative  . No narrative on file   Immunization History  Administered Date(s) Administered  . Tdap 10/21/2016   Review of Systems Review of Systems  Constitutional: Positive for malaise/fatigue.  HENT: Positive for congestion, ear discharge, sinus pain and sore throat. Negative for ear pain, hearing loss and tinnitus.   Eyes: Negative.   Respiratory: Negative.   Cardiovascular: Negative.   Gastrointestinal: Negative for heartburn and nausea.  Genitourinary: Negative.   Musculoskeletal: Negative.   Neurological: Negative.   Endo/Heme/Allergies: Negative.      Objective:   Physical Exam  Constitutional: He is oriented to person, place, and time.  HENT:  Head: Normocephalic.  Left Ear: There is drainage. Tympanic membrane is erythematous.  Nose: Mucosal edema present. Right sinus exhibits maxillary sinus tenderness. Left sinus exhibits maxillary sinus  tenderness.  Mouth/Throat: Posterior oropharyngeal erythema present.  Scant yellow drainage in left middle ear  Eyes: Pupils are equal, round, and reactive to light. Conjunctivae are normal.  Neck: Normal range of motion. Neck supple.  Cardiovascular: Normal rate, normal heart sounds and intact distal pulses.   Pulmonary/Chest: Effort normal and breath sounds normal.  Abdominal: Soft. Bowel sounds are normal.  Musculoskeletal: Normal range of motion.  Neurological: He is alert and oriented to person, place, and time. Gait normal. GCS score is 15.  Psychiatric: Mood, memory, affect and judgment normal.     Assessment:  Acute otitis media and sinusitis Plan:   1. Acute serous otitis media of left ear, recurrence not specified Nasal saline sprays. Antihistamines per medication orders. Augmentin per medication orders. - amoxicillin-clavulanate (AUGMENTIN) 875-125 MG tablet; Take 1 tablet by mouth 2 (two) times daily.  Dispense: 20 tablet; Refill: 0  2. Acute maxillary sinusitis, recurrence not specified - amoxicillin-clavulanate (AUGMENTIN) 875-125 MG tablet; Take 1 tablet by mouth 2 (two) times daily.  Dispense: 20 tablet; Refill: 0  3. Essential hypertension Blood pressure is at goal on current medication regimen, no changes warranted. - Continue medication, monitor blood pressure at home. Continue DASH diet. Reminder to go to the ER if any CP, SOB, nausea, dizziness, severe HA, changes vision/speech, left arm numbness and tingling and jaw pain  4. Gastroesophageal reflux disease without esophagitis Will start a 6 week trial of omeprazole for GERD symptoms.  - omeprazole (PRILOSEC) 20 MG capsule; Take 1 capsule (20 mg total) by mouth daily.  Dispense: 42 capsule; Refill: 0   RTC:  As previously scheduled for hypertension  Nolon Nations  MSN, FNP-C Patient Care [facility] [address] [place], [place] [address] [phone]

## 2016-12-12 NOTE — Patient Instructions (Signed)
Augmentin 875/125 mg twice daily for 10 days with food to prevent gi upset.  Hypertension: Your blood pressure is at goal on current medication regimen. No changes are warranted on today    Sinusitis, Adult Sinusitis is soreness and inflammation of your sinuses. Sinuses are hollow spaces in the bones around your face. They are located:  Around your eyes.  In the middle of your forehead.  Behind your nose.  In your cheekbones.  Your sinuses and nasal passages are lined with a stringy fluid (mucus). Mucus normally drains out of your sinuses. When your nasal tissues get inflamed or swollen, the mucus can get trapped or blocked so air cannot flow through your sinuses. This lets bacteria, viruses, and funguses grow, and that leads to infection. Follow these instructions at home: Medicines  Take, use, or apply over-the-counter and prescription medicines only as told by your doctor. These may include nasal sprays.  If you were prescribed an antibiotic medicine, take it as told by your doctor. Do not stop taking the antibiotic even if you start to feel better. Hydrate and Humidify  Drink enough water to keep your pee (urine) clear or pale yellow.  Use a cool mist humidifier to keep the humidity level in your home above 50%.  Breathe in steam for 10-15 minutes, 3-4 times a day or as told by your doctor. You can do this in the bathroom while a hot shower is running.  Try not to spend time in cool or dry air. Rest  Rest as much as possible.  Sleep with your head raised (elevated).  Make sure to get enough sleep each night. General instructions  Put a warm, moist washcloth on your face 3-4 times a day or as told by your doctor. This will help with discomfort.  Wash your hands often with soap and water. If there is no soap and water, use hand sanitizer.  Do not smoke. Avoid being around people who are smoking (secondhand smoke).  Keep all follow-up visits as told by your doctor.  This is important. Contact a doctor if:  You have a fever.  Your symptoms get worse.  Your symptoms do not get better within 10 days. Get help right away if:  You have a very bad headache.  You cannot stop throwing up (vomiting).  You have pain or swelling around your face or eyes.  You have trouble seeing.  You feel confused.  Your neck is stiff.  You have trouble breathing. This information is not intended to replace advice given to you by your health care provider. Make sure you discuss any questions you have with your health care provider. Document Released: 09/24/2007 Document Revised: 12/02/2015 Document Reviewed: 01/31/2015 Elsevier Interactive Patient Education  2018 Elsevier Inc. Amoxicillin; Clavulanic Acid chewable tablets What is this medicine? AMOXICILLIN; CLAVULANIC ACID (a mox i SIL in; KLAV yoo lan ic AS id) is a penicillin antibiotic. It is used to treat certain kinds of bacterial infections. It It will not work for colds, flu, or other viral infections. This medicine may be used for other purposes; ask your health care provider or pharmacist if you have questions. COMMON BRAND NAME(S): Augmentin What should I tell my health care provider before I take this medicine? They need to know if you have any of these conditions: -bowel disease, like colitis -kidney disease -liver disease -mononucleosis -phenylketonuria -an unusual or allergic reaction to amoxicillin, penicillin, cephalosporin, other antibiotics, clavulanic acid, other medicines, foods, dyes, or preservatives -pregnant or trying  to get pregnant -breast-feeding How should I use this medicine? Take this medicine by mouth. Chew it completely before swallowing. Follow the directions on the prescription label. Take this medicine at the start of a meal or snack. Take your medicine at regular intervals. Do not take your medicine more often than directed. Take all of your medicine as directed even if you  think you are better. Do not skip doses or stop your medicine early. Talk to your pediatrician regarding the use of this medicine in children. While this drug may be prescribed for selected conditions, precautions do apply. Overdosage: If you think you have taken too much of this medicine contact a poison control center or emergency room at once. NOTE: This medicine is only for you. Do not share this medicine with others. What if I miss a dose? If you miss a dose, take it as soon as you can. If it is almost time for your next dose, take only that dose. Do not take double or extra doses. What may interact with this medicine? -allopurinol -anticoagulants -birth control pills -methotrexate -probenecid This list may not describe all possible interactions. Give your health care provider a list of all the medicines, herbs, non-prescription drugs, or dietary supplements you use. Also tell them if you smoke, drink alcohol, or use illegal drugs. Some items may interact with your medicine. What should I watch for while using this medicine? Tell your doctor or health care professional if your symptoms do not improve. Do not treat diarrhea with over the counter products. Contact your doctor if you have diarrhea that lasts more than 2 days or if it is severe and watery. If you have diabetes, you may get a false-positive result for sugar in your urine. Check with your doctor or health care professional. Birth control pills may not work properly while you are taking this medicine. Talk to your doctor about using an extra method of birth control. What side effects may I notice from receiving this medicine? Side effects that you should report to your doctor or health care professional as soon as possible: -allergic reactions like skin rash, itching or hives, swelling of the face, lips, or tongue -breathing problems -dark urine -fever or chills, sore throat -redness, blistering, peeling or loosening of the  skin, including inside the mouth -seizures -trouble passing urine or change in the amount of urine -unusual bleeding, bruising -unusually weak or tired -white patches or sores in the mouth or throat Side effects that usually do not require medical attention (report to your doctor or health care professional if they continue or are bothersome): -diarrhea -dizziness -headache -nausea, vomiting -stomach upset -vaginal or anal irritation This list may not describe all possible side effects. Call your doctor for medical advice about side effects. You may report side effects to FDA at 1-800-FDA-1088. Where should I keep my medicine? Keep out of the reach of children. Store at room temperature below 25 degrees C (77 degrees F). Keep container tightly closed. Throw away any unused medicine after the expiration date. NOTE: This sheet is a summary. It may not cover all possible information. If you have questions about this medicine, talk to your doctor, pharmacist, or health care provider.  2018 Elsevier/Gold Standard (2007-07-01 11:38:22)

## 2016-12-24 ENCOUNTER — Ambulatory Visit: Payer: PRIVATE HEALTH INSURANCE | Admitting: Family Medicine

## 2017-01-01 ENCOUNTER — Ambulatory Visit (INDEPENDENT_AMBULATORY_CARE_PROVIDER_SITE_OTHER): Payer: PRIVATE HEALTH INSURANCE | Admitting: Family Medicine

## 2017-01-01 ENCOUNTER — Encounter: Payer: Self-pay | Admitting: Family Medicine

## 2017-01-01 VITALS — BP 141/76 | HR 95 | Temp 98.0°F | Resp 16 | Ht 76.0 in | Wt 334.0 lb

## 2017-01-01 DIAGNOSIS — J3089 Other allergic rhinitis: Secondary | ICD-10-CM

## 2017-01-01 DIAGNOSIS — B359 Dermatophytosis, unspecified: Secondary | ICD-10-CM | POA: Diagnosis not present

## 2017-01-01 DIAGNOSIS — H65112 Acute and subacute allergic otitis media (mucoid) (sanguinous) (serous), left ear: Secondary | ICD-10-CM | POA: Diagnosis not present

## 2017-01-01 DIAGNOSIS — J321 Chronic frontal sinusitis: Secondary | ICD-10-CM | POA: Diagnosis not present

## 2017-01-01 DIAGNOSIS — H65192 Other acute nonsuppurative otitis media, left ear: Secondary | ICD-10-CM

## 2017-01-01 MED ORDER — CIPROFLOXACIN-DEXAMETHASONE 0.3-0.1 % OT SUSP: 4.0000 [drp] | Freq: Two times a day (BID) | OTIC | 0 refills | Status: AC

## 2017-01-01 MED ORDER — MONTELUKAST SODIUM 10 MG PO TABS: 10.0000 mg | ORAL_TABLET | Freq: Every day | ORAL | 3 refills | Status: DC

## 2017-01-01 MED ORDER — CIPROFLOXACIN-DEXAMETHASONE 0.3-0.1 % OT SUSP: 4.0000 [drp] | Freq: Two times a day (BID) | OTIC | 0 refills | Status: DC

## 2017-01-01 MED ORDER — CLOTRIMAZOLE 1 % EX CREA: 1.0000 "application " | TOPICAL_CREAM | Freq: Two times a day (BID) | CUTANEOUS | 0 refills | Status: DC

## 2017-01-01 NOTE — Patient Instructions (Signed)
Body Ringworm Body ringworm is an infection of the skin that often causes a ring-shaped rash. Body ringworm can affect any part of your skin. It can spread easily to others. Body ringworm is also called tinea corporis. What are the causes? This condition is caused by funguses called dermatophytes. The condition develops when these funguses grow out of control on the skin. You can get this condition if you touch a person or animal that has it. You can also get it if you share clothing, bedding, towels, or any other object with an infected person or pet. What increases the risk? This condition is more likely to develop in:  Athletes who often make skin-to-skin contact with other athletes, such as wrestlers.  People who share equipment and mats.  People with a weakened immune system.  What are the signs or symptoms? Symptoms of this condition include:  Itchy, raised red spots and bumps.  Red scaly patches.  A ring-shaped rash. The rash may have: ? A clear center. ? Scales or red bumps at its center. ? Redness near its borders. ? Dry and scaly skin on or around it.  How is this diagnosed? This condition can usually be diagnosed with a skin exam. A skin scraping may be taken from the affected area and examined under a microscope to see if the fungus is present. How is this treated? This condition may be treated with:  An antifungal cream or ointment.  An antifungal shampoo.  Antifungal medicines. These may be prescribed if your ringworm is severe, keeps coming back, or lasts a long time.  Follow these instructions at home:  Take over-the-counter and prescription medicines only as told by your health care provider.  If you were given an antifungal cream or ointment: ? Use it as told by your health care provider. ? Wash the infected area and dry it completely before applying the cream or ointment.  If you were given an antifungal shampoo: ? Use it as told by your health care  provider. ? Leave the shampoo on your body for 3-5 minutes before rinsing.  While you have a rash: ? Wear loose clothing to stop clothes from rubbing and irritating it. ? Wash or change your bed sheets every night.  If your pet has the same infection, take your pet to see a International aid/development worker. How is this prevented?  Practice good hygiene.  Wear sandals or shoes in public places and showers.  Do not share personal items with others.  Avoid touching red patches of skin on other people.  Avoid touching pets that have bald spots.  If you touch an animal that has a bald spot, wash your hands. Contact a health care provider if:  Your rash continues to spread after 7 days of treatment.  Your rash is not gone in 4 weeks.  The area around your rash gets red, warm, tender, and swollen. This information is not intended to replace advice given to you by your health care provider. Make sure you discuss any questions you have with your health care provider. Document Released: 04/04/2000 Document Revised: 09/13/2015 Document Reviewed: 02/01/2015 Elsevier Interactive Patient Education  2018 [employer]. Ciprofloxacin; Dexamethasone ear suspension What is this medicine? CIPROFLOXACIN; DEXAMETHASONE (sip roe FLOX a sin; dex a METH a sone) is used to treat ear infections. It also stops the swelling and itching caused by the infection. This medicine may be used for other purposes; ask your health care provider or pharmacist if you have questions. COMMON BRAND  NAME(S): Ciprodex Otic What should I tell my health care provider before I take this medicine? They need to know if you have any of these conditions: -any other active infection -viral ear infection -an unusual or allergic reaction to ciprofloxacin; dexamethasone, other medicines, foods, dyes, or preservatives -pregnant or trying to get pregnant -breast-feeding How should I use this medicine? This medicine is only for use in the ears. Wash  your hands with soap and water. Do not insert any object or swab into the ear canal. Gently warm the bottle by holding it in the hand for 1 to 2 minutes. Shake the bottle immediately before using. Lie down on your side with the affected ear up. Try not to touch the tip of the dropper to your ear, fingertips, or other surface. Squeeze the bottle gently to put the prescribed number of drops in the ear canal. Stay in this position for 30 to 60 seconds to help the drops soak into the ear. Repeat the steps for the other ear if both ears are infected. Do not use your medicine more often than directed. Finish the full course of medicine prescribed by your doctor or health care professional even if you think your condition is better. Talk to your pediatrician regarding the use of this medicine in children. While this drug may be prescribed for children as young as in children 61 months of age and older for selected conditions, precautions do apply. Overdosage: If you think you have taken too much of this medicine contact a poison control center or emergency room at once. NOTE: This medicine is only for you. Do not share this medicine with others. What if I miss a dose? If you miss a dose, use it as soon as you can. If it is almost time for your next dose, use only that dose. Do not use double or extra doses. What may interact with this medicine? Interactions are not expected. Do not use any other ear products without telling your doctor or health care professional. This list may not describe all possible interactions. Give your health care provider a list of all the medicines, herbs, non-prescription drugs, or dietary supplements you use. Also tell them if you smoke, drink alcohol, or use illegal drugs. Some items may interact with your medicine. What should I watch for while using this medicine? Tell your doctor or health care professional if your ear infection does not get better in a few days. If rash or  allergic reaction occurs, stop using immediately and contact your doctor or health care professional. It is important that you keep the infected ear(s) clean and dry. When bathing, try not to get the infected ear(s) wet. Do not go swimming unless your doctor or health care professional has told you otherwise. To prevent the spread of infection, do not share ear products or share towels and washcloths with anyone else. What side effects may I notice from receiving this medicine? Side effects that you should report to your doctor or health care professional as soon as possible: -allergic reactions like skin rash, itching or hives, swelling of the face, lips, or tongue -burning, itching, and redness -worsening ear pain Side effects that usually do not require medical attention (report to your doctor or health care professional if they continue or are bothersome): -abnormal feeling in the ear -headache -unpleasant feeling while putting the drops in the ear This list may not describe all possible side effects. Call your doctor for medical advice about side effects.  You may report side effects to FDA at 1-800-FDA-1088. Where should I keep my medicine? Keep out of the reach of children. Store at room temperature between 15 and 30 degrees C (59 and 86 degrees F). Do not freeze. Protect from light. Throw away any unused medicine after the expiration date. NOTE: This sheet is a summary. It may not cover all possible information. If you have questions about this medicine, talk to your doctor, pharmacist, or health care provider.  2018 Elsevier/Gold Standard (2007-06-23 10:33:06)

## 2017-01-01 NOTE — Progress Notes (Signed)
Subjective:     Scott Buchanan is a 51 y.o. male with a history of chronic sinusitis presents complaining of left ear pain. Symptoms include left ear pain, post nasal drip. Patient has had approximately 3 acute sinus infections in the past year. Patient reports a history of allergic rhinitis. Patient reports chronic use of OTC nasal decongestant sprays. Previous treatment has included nasal saline sprays/rinses, nasal steroids, prescription antihistamines, topical decongestant sprays and sinus surgery, which have given moderate relief. Patient is also a former smoker. He had a sinus surgery in 1997 Past Medical History:  Diagnosis Date  . Asthma    Social History   Social History  . Marital status: Single    Spouse name: N/A  . Number of children: N/A  . Years of education: N/A   Occupational History  . Not on file.   Social History Main Topics  . Smoking status: Former Games developermoker  . Smokeless tobacco: Never Used  . Alcohol use No  . Drug use: No  . Sexual activity: Yes   Other Topics Concern  . Not on file   Social History Narrative  . No narrative on file   Immunization History  Administered Date(s) Administered  . Tdap 10/21/2016   Review of Systems Review of Systems  Constitutional: Positive for malaise/fatigue.  HENT: Positive for congestion, ear discharge and sinus pain. Negative for ear pain, hearing loss and tinnitus.   Eyes: Negative.   Respiratory: Negative.   Cardiovascular: Negative.   Gastrointestinal: Negative for heartburn and nausea.  Genitourinary: Negative.   Musculoskeletal: Negative.   Neurological: Negative.   Endo/Heme/Allergies: Negative.      Objective:   Physical Exam  Constitutional: He is oriented to person, place, and time.  HENT:  Head: Normocephalic.  Left Ear: There is drainage. Tympanic membrane is erythematous.  Nose: Mucosal edema present. Right sinus exhibits maxillary sinus tenderness. Left sinus exhibits maxillary sinus  tenderness.  Mouth/Throat: Posterior oropharyngeal erythema present.  Scant yellow drainage in left middle ear  Eyes: Pupils are equal, round, and reactive to light. Conjunctivae are normal.  Neck: Normal range of motion. Neck supple.  Cardiovascular: Normal rate, normal heart sounds and intact distal pulses.   Pulmonary/Chest: Effort normal and breath sounds normal.  Abdominal: Soft. Bowel sounds are normal.  Musculoskeletal: Normal range of motion.  Neurological: He is alert and oriented to person, place, and time. Gait normal. GCS score is 15.  Skin:  Erythematous, ring shaped, scaly to left inner thigh  Psychiatric: Mood, memory, affect and judgment normal.     Assessment:  Acute otitis media and sinusitis Plan:    1. Chronic frontal sinusitis - Ambulatory referral to ENT  2. Acute mucoid otitis media of left ear - ciprofloxacin-dexamethasone (CIPRODEX) OTIC suspension; Place 4 drops into the left ear 2 (two) times daily.  Dispense: 7.5 mL; Refill: 0  3. Non-seasonal allergic rhinitis, unspecified trigger - montelukast (SINGULAIR) 10 MG tablet; Take 1 tablet (10 mg total) by mouth at bedtime.  Dispense: 30 tablet; Refill: 3  4. Ringworm (tinea corporis) - clotrimazole (LOTRIMIN) 1 % cream; Apply 1 application topically 2 (two) times daily.  Dispense: 30 g; Refill: 0iously scheduled for hypertension  Nolon NationsLaChina Moore Chandell Attridge  MSN, FNP-C Patient Care [facility] [address]  [place], [place] [address] [phone]

## 2017-01-02 ENCOUNTER — Ambulatory Visit: Payer: PRIVATE HEALTH INSURANCE | Admitting: Family Medicine

## 2017-01-02 DIAGNOSIS — J321 Chronic frontal sinusitis: Secondary | ICD-10-CM | POA: Insufficient documentation

## 2017-01-28 ENCOUNTER — Other Ambulatory Visit: Payer: Self-pay | Admitting: Family Medicine

## 2017-01-28 DIAGNOSIS — B359 Dermatophytosis, unspecified: Secondary | ICD-10-CM

## 2017-02-18 ENCOUNTER — Other Ambulatory Visit (INDEPENDENT_AMBULATORY_CARE_PROVIDER_SITE_OTHER): Payer: Self-pay | Admitting: Otolaryngology

## 2017-02-18 DIAGNOSIS — J329 Chronic sinusitis, unspecified: Secondary | ICD-10-CM

## 2017-02-23 ENCOUNTER — Other Ambulatory Visit: Payer: PRIVATE HEALTH INSURANCE

## 2017-02-27 ENCOUNTER — Ambulatory Visit
Admission: RE | Admit: 2017-02-27 | Discharge: 2017-02-27 | Disposition: A | Discharge status: Home / Self Care | Payer: PRIVATE HEALTH INSURANCE | Source: Ambulatory Visit | Attending: Otolaryngology | Admitting: Otolaryngology

## 2017-02-27 DIAGNOSIS — J329 Chronic sinusitis, unspecified: Secondary | ICD-10-CM

## 2017-03-18 ENCOUNTER — Encounter: Payer: Self-pay | Admitting: Family Medicine

## 2017-03-18 ENCOUNTER — Ambulatory Visit: Payer: PRIVATE HEALTH INSURANCE | Admitting: Family Medicine

## 2017-03-18 ENCOUNTER — Ambulatory Visit (INDEPENDENT_AMBULATORY_CARE_PROVIDER_SITE_OTHER): Payer: PRIVATE HEALTH INSURANCE | Admitting: Family Medicine

## 2017-03-18 VITALS — BP 132/90 | HR 108 | Temp 98.1°F | Resp 18 | Ht 76.0 in | Wt 346.0 lb

## 2017-03-18 DIAGNOSIS — I10 Essential (primary) hypertension: Secondary | ICD-10-CM | POA: Diagnosis not present

## 2017-03-18 DIAGNOSIS — M25512 Pain in left shoulder: Secondary | ICD-10-CM

## 2017-03-18 LAB — BASIC METABOLIC PANEL WITH GFR
BUN: 22 mg/dL (ref 7–25)
CALCIUM: 10.3 mg/dL (ref 8.6–10.3)
CHLORIDE: 102 mmol/L (ref 98–110)
CO2: 24 mmol/L (ref 20–32)
Creat: 1.09 mg/dL (ref 0.60–1.35)
GFR, Est African American: 92 mL/min/{1.73_m2} (ref >=60)
GFR, Est Non African American: 79 mL/min/{1.73_m2} (ref >=60)
GLUCOSE: 137 mg/dL — AB (ref 65–99)
POTASSIUM: 4.1 mmol/L (ref 3.5–5.3)
SODIUM: 138 mmol/L (ref 135–146)

## 2017-03-18 NOTE — Progress Notes (Signed)
Scott DallyDavid Buchanan, a 51 year old male, presents for a 3 month follow up of hypertension. He has a history of hypertension, hyperlipidemia, and obesity. [name] has been taking all medications consistently. He did not start a routine exercise regimen or follow a low fat, low carbohydrate diet. He does not check blood pressure at home Patient denies chest pain, dyspnea, fatigue, lower extremity edema, palpitations, syncope and tachypnea.  Cardiovascular risk factors include: dyslipidemia, hypertension, obesity (BMI >= 30 kg/m2) and smoking/ tobacco exposure. He recently quit smoking, but his roommate continues to smoke inside of the house.  [name] is also complaining of left shoulder pain. This is evaluated as a personal injury. The pain is described as aching.  The onset of the pain was sudden, related to lifting a heavy item. Mechanism of injury: reaching.  The pain occurs when active.    Past Medical History:  Diagnosis Date  . Asthma    Social History   Socioeconomic History  . Marital status: Single    Spouse name: Not on file  . Number of children: Not on file  . Years of education: Not on file  . Highest education level: Not on file  Social Needs  . Financial resource strain: Not on file  . Food insecurity - worry: Not on file  . Food insecurity - inability: Not on file  . Transportation needs - medical: Not on file  . Transportation needs - non-medical: Not on file  Occupational History  . Not on file  Tobacco Use  . Smoking status: Former Games developermoker  . Smokeless tobacco: Never Used  Substance and Sexual Activity  . Alcohol use: No  . Drug use: No  . Sexual activity: Yes  Other Topics Concern  . Not on file  Social History Narrative  . Not on file     Review of Systems  HENT: Negative.   Eyes: Negative.   Cardiovascular: Negative.  Negative for chest pain, palpitations and leg swelling.  Gastrointestinal: Negative.   Genitourinary: Negative.  Negative for dysuria,  hematuria and urgency.  Musculoskeletal: Negative.   Skin: Negative.   Neurological: Negative.  Negative for dizziness and headaches.  Endo/Heme/Allergies: Negative.   Psychiatric/Behavioral: Negative.  Negative for depression.    Physical Exam  Constitutional: He is oriented to person, place, and time and well-developed, well-nourished, and in no distress.  Morbid obesity  HENT:  Head: Normocephalic and atraumatic.  Right Ear: External ear normal.  Left Ear: External ear normal.  Eyes: Conjunctivae and EOM are normal. Pupils are equal, round, and reactive to light.  Neck: Normal range of motion. Neck supple.  Cardiovascular: Normal rate, regular rhythm, normal heart sounds and intact distal pulses.  Pulmonary/Chest: No respiratory distress.  Abdominal: Soft. Bowel sounds are normal.  Increased abdominal girth  Musculoskeletal: Normal range of motion.  Neurological: He is alert and oriented to person, place, and time. Gait normal.  Skin: Skin is warm and dry.  Psychiatric: Mood, memory, affect and judgment normal.  BP (!) 130/92 (BP Location: Left Arm, Patient Position: Sitting, Cuff Size: Large) Comment: manually  Pulse (!) 108   Temp 98.1 F (36.7 C) (Oral)   Resp 18   Ht 6\' 4"  (1.93 m)   Wt (!) 346 lb (156.9 kg)   SpO2 97%   BMI 42.12 kg/m  1. Essential hypertension Blood pressure is at goal on current medication regiment - Continue medication, monitor blood pressure at home. Continue DASH diet. Reminder to go to the ER  if any CP, SOB, nausea, dizziness, severe HA, changes vision/speech, left arm numbness and tingling and jaw pain.  - BASIC METABOLIC PANEL WITH GFR  2. Acute pain of left shoulder Recommend Tylenol 500 mg every 6 hours as needed for mild to moderate pain Biofreeze as directed Warm, moist compresses to left shoulder as needed.   3. Morbid obesity (HCC) Recommend a lowfat, low carbohydrate diet divided over 5-6 small meals, increase water intake to 6-8  glasses, and 150 minutes per week of cardiovascular exercise.     RTC: 3 months for hypertension   Nolon NationsLaChina Moore Rayvion Stumph  MSN, FNP-C Patient Care [facility] [address]  [place], [place] [id number] [phone]

## 2017-03-18 NOTE — Patient Instructions (Signed)
Left shoulder pain:  Recommend Tylenol mg every 6 hours as needed for mild to moderate pain Recommend warm, moist compresses as needed  Recommend OTC Biofreeze as directed   Your blood pressure is at goal, no medication changes warranted

## 2017-03-19 LAB — POCT URINALYSIS DIP (DEVICE)
Bilirubin Urine: NEGATIVE
GLUCOSE, UA: 250 mg/dL — AB
Hgb urine dipstick: NEGATIVE
Ketones, ur: NEGATIVE mg/dL
LEUKOCYTES UA: NEGATIVE
NITRITE: NEGATIVE
PROTEIN: NEGATIVE mg/dL
Specific Gravity, Urine: 1.025 (ref 1.005–1.030)
UROBILINOGEN UA: 0.2 mg/dL (ref 0.0–1.0)
pH: 5.5 (ref 5.0–8.0)

## 2017-03-25 ENCOUNTER — Other Ambulatory Visit: Payer: Self-pay | Admitting: Otolaryngology

## 2017-04-02 ENCOUNTER — Encounter (HOSPITAL_BASED_OUTPATIENT_CLINIC_OR_DEPARTMENT_OTHER): Payer: Self-pay | Admitting: *Deleted

## 2017-04-03 ENCOUNTER — Encounter (HOSPITAL_BASED_OUTPATIENT_CLINIC_OR_DEPARTMENT_OTHER)
Admission: RE | Admit: 2017-04-03 | Discharge: 2017-04-03 | Disposition: A | Discharge status: Home / Self Care | Payer: PRIVATE HEALTH INSURANCE | Source: Ambulatory Visit | Attending: Otolaryngology | Admitting: Otolaryngology

## 2017-04-03 DIAGNOSIS — Z01810 Encounter for preprocedural cardiovascular examination: Secondary | ICD-10-CM | POA: Insufficient documentation

## 2017-04-03 DIAGNOSIS — I10 Essential (primary) hypertension: Secondary | ICD-10-CM | POA: Insufficient documentation

## 2017-04-03 NOTE — Progress Notes (Signed)
EKG reviewed by Dr. Turk, will proceed with surgery as scheduled. 

## 2017-04-07 ENCOUNTER — Ambulatory Visit (HOSPITAL_BASED_OUTPATIENT_CLINIC_OR_DEPARTMENT_OTHER): Payer: PRIVATE HEALTH INSURANCE | Admitting: Anesthesiology

## 2017-04-07 ENCOUNTER — Ambulatory Visit (HOSPITAL_BASED_OUTPATIENT_CLINIC_OR_DEPARTMENT_OTHER)
Admission: RE | Admit: 2017-04-07 | Discharge: 2017-04-07 | Disposition: A | Discharge status: Home / Self Care | Payer: PRIVATE HEALTH INSURANCE | Source: Ambulatory Visit | Attending: Otolaryngology | Admitting: Otolaryngology

## 2017-04-07 ENCOUNTER — Other Ambulatory Visit: Payer: Self-pay

## 2017-04-07 ENCOUNTER — Encounter (HOSPITAL_BASED_OUTPATIENT_CLINIC_OR_DEPARTMENT_OTHER)
Admission: RE | Disposition: A | Discharge status: Home / Self Care | Payer: Self-pay | Source: Ambulatory Visit | Attending: Otolaryngology

## 2017-04-07 ENCOUNTER — Encounter (HOSPITAL_BASED_OUTPATIENT_CLINIC_OR_DEPARTMENT_OTHER): Payer: Self-pay | Admitting: *Deleted

## 2017-04-07 DIAGNOSIS — J321 Chronic frontal sinusitis: Secondary | ICD-10-CM | POA: Insufficient documentation

## 2017-04-07 DIAGNOSIS — J322 Chronic ethmoidal sinusitis: Secondary | ICD-10-CM

## 2017-04-07 DIAGNOSIS — Z6841 Body Mass Index (BMI) 40.0 and over, adult: Secondary | ICD-10-CM | POA: Insufficient documentation

## 2017-04-07 DIAGNOSIS — J323 Chronic sphenoidal sinusitis: Secondary | ICD-10-CM | POA: Diagnosis not present

## 2017-04-07 DIAGNOSIS — J343 Hypertrophy of nasal turbinates: Secondary | ICD-10-CM | POA: Insufficient documentation

## 2017-04-07 DIAGNOSIS — Z87891 Personal history of nicotine dependence: Secondary | ICD-10-CM | POA: Diagnosis not present

## 2017-04-07 DIAGNOSIS — I10 Essential (primary) hypertension: Secondary | ICD-10-CM | POA: Insufficient documentation

## 2017-04-07 DIAGNOSIS — J45909 Unspecified asthma, uncomplicated: Secondary | ICD-10-CM | POA: Diagnosis not present

## 2017-04-07 DIAGNOSIS — J3489 Other specified disorders of nose and nasal sinuses: Secondary | ICD-10-CM | POA: Insufficient documentation

## 2017-04-07 HISTORY — PX: TURBINATE REDUCTION: SHX6157

## 2017-04-07 HISTORY — PX: ETHMOIDECTOMY: SHX5197

## 2017-04-07 HISTORY — DX: Essential (primary) hypertension: I10

## 2017-04-07 HISTORY — PX: SINUS ENDO WITH FUSION: SHX5329

## 2017-04-07 LAB — GLUCOSE, CAPILLARY
Glucose-Capillary: 155 mg/dL — ABNORMAL HIGH (ref 65–99)
Glucose-Capillary: 163 mg/dL — ABNORMAL HIGH (ref 65–99)

## 2017-04-07 SURGERY — REDUCTION, NASAL TURBINATE
Anesthesia: General | Site: Nose | Laterality: Bilateral

## 2017-04-07 MED ORDER — LIDOCAINE 2% (20 MG/ML) 5 ML SYRINGE
INTRAMUSCULAR | Status: AC
  Filled 2017-04-07: qty 5

## 2017-04-07 MED ORDER — SUGAMMADEX SODIUM 200 MG/2ML IV SOLN
INTRAVENOUS | Status: AC
  Filled 2017-04-07: qty 2

## 2017-04-07 MED ORDER — MIDAZOLAM HCL 2 MG/2ML IJ SOLN
INTRAMUSCULAR | Status: AC
  Filled 2017-04-07: qty 2

## 2017-04-07 MED ORDER — PROPOFOL 10 MG/ML IV BOLUS
INTRAVENOUS | Status: DC | PRN
  Administered 2017-04-07: 300 mg via INTRAVENOUS

## 2017-04-07 MED ORDER — EPINEPHRINE 30 MG/30ML IJ SOLN
INTRAMUSCULAR | Status: AC
  Filled 2017-04-07: qty 1

## 2017-04-07 MED ORDER — DEXAMETHASONE SODIUM PHOSPHATE 4 MG/ML IJ SOLN
INTRAMUSCULAR | Status: DC | PRN
  Administered 2017-04-07: 6 mg via INTRAVENOUS

## 2017-04-07 MED ORDER — LIDOCAINE HCL (CARDIAC) 20 MG/ML IV SOLN
INTRAVENOUS | Status: DC | PRN
  Administered 2017-04-07: 100 mg via INTRAVENOUS

## 2017-04-07 MED ORDER — SUCCINYLCHOLINE CHLORIDE 20 MG/ML IJ SOLN
INTRAMUSCULAR | Status: DC | PRN
  Administered 2017-04-07: 140 mg via INTRAVENOUS

## 2017-04-07 MED ORDER — SCOPOLAMINE 1 MG/3DAYS TD PT72: 1.0000 | MEDICATED_PATCH | Freq: Once | TRANSDERMAL | Status: DC | PRN

## 2017-04-07 MED ORDER — OXYCODONE-ACETAMINOPHEN 5-325 MG PO TABS: 1.0000 | ORAL_TABLET | ORAL | 0 refills | Status: DC | PRN

## 2017-04-07 MED ORDER — LIDOCAINE-EPINEPHRINE 1 %-1:100000 IJ SOLN
INTRAMUSCULAR | Status: AC
  Filled 2017-04-07: qty 2

## 2017-04-07 MED ORDER — CEFAZOLIN SODIUM-DEXTROSE 1-4 GM/50ML-% IV SOLN
INTRAVENOUS | Status: DC | PRN
  Administered 2017-04-07: 3 g via INTRAVENOUS

## 2017-04-07 MED ORDER — PROMETHAZINE HCL 25 MG/ML IJ SOLN: 6.2500 mg | INTRAMUSCULAR | Status: DC | PRN

## 2017-04-07 MED ORDER — PROPOFOL 10 MG/ML IV BOLUS
INTRAVENOUS | Status: AC
  Filled 2017-04-07: qty 40

## 2017-04-07 MED ORDER — BSS IO SOLN
INTRAOCULAR | Status: AC
  Filled 2017-04-07: qty 15

## 2017-04-07 MED ORDER — OXYMETAZOLINE HCL 0.05 % NA SOLN
NASAL | Status: DC | PRN
  Administered 2017-04-07: 1 via TOPICAL

## 2017-04-07 MED ORDER — MIDAZOLAM HCL 2 MG/2ML IJ SOLN
1.0000 mg | INTRAMUSCULAR | Status: DC | PRN
Start: 2017-04-07 — End: 2017-04-07
  Administered 2017-04-07: 1 mg via INTRAVENOUS

## 2017-04-07 MED ORDER — FENTANYL CITRATE (PF) 100 MCG/2ML IJ SOLN
50.0000 ug | INTRAMUSCULAR | Status: AC | PRN
  Administered 2017-04-07 (×4): 50 ug via INTRAVENOUS

## 2017-04-07 MED ORDER — HYDROMORPHONE HCL 1 MG/ML IJ SOLN: 0.2500 mg | INTRAMUSCULAR | Status: DC | PRN

## 2017-04-07 MED ORDER — ONDANSETRON HCL 4 MG/2ML IJ SOLN
INTRAMUSCULAR | Status: DC | PRN
  Administered 2017-04-07: 4 mg via INTRAVENOUS

## 2017-04-07 MED ORDER — AMOXICILLIN 875 MG PO TABS: 875.0000 mg | ORAL_TABLET | Freq: Two times a day (BID) | ORAL | 0 refills | Status: AC

## 2017-04-07 MED ORDER — FENTANYL CITRATE (PF) 100 MCG/2ML IJ SOLN
INTRAMUSCULAR | Status: AC
  Filled 2017-04-07: qty 4

## 2017-04-07 MED ORDER — LACTATED RINGERS IV SOLN
INTRAVENOUS | Status: DC
  Administered 2017-04-07 (×2): via INTRAVENOUS

## 2017-04-07 MED ORDER — DEXAMETHASONE SODIUM PHOSPHATE 10 MG/ML IJ SOLN
INTRAMUSCULAR | Status: AC
  Filled 2017-04-07: qty 1

## 2017-04-07 MED ORDER — PHENYLEPHRINE HCL 10 MG/ML IJ SOLN
INTRAMUSCULAR | Status: DC | PRN
  Administered 2017-04-07 (×2): 120 ug via INTRAVENOUS

## 2017-04-07 MED ORDER — ROCURONIUM BROMIDE 10 MG/ML (PF) SYRINGE
PREFILLED_SYRINGE | INTRAVENOUS | Status: AC
  Filled 2017-04-07: qty 5

## 2017-04-07 MED ORDER — ARTIFICIAL TEARS OPHTHALMIC OINT
TOPICAL_OINTMENT | OPHTHALMIC | Status: DC | PRN
  Administered 2017-04-07: 2 via OPHTHALMIC

## 2017-04-07 MED ORDER — OXYMETAZOLINE HCL 0.05 % NA SOLN
NASAL | Status: AC
  Filled 2017-04-07: qty 15

## 2017-04-07 MED ORDER — MUPIROCIN 2 % EX OINT
TOPICAL_OINTMENT | CUTANEOUS | Status: DC | PRN
  Administered 2017-04-07: 1 via TOPICAL

## 2017-04-07 MED ORDER — ROCURONIUM BROMIDE 100 MG/10ML IV SOLN
INTRAVENOUS | Status: DC | PRN
  Administered 2017-04-07: 50 mg via INTRAVENOUS
  Administered 2017-04-07: 20 mg via INTRAVENOUS
  Administered 2017-04-07: 10 mg via INTRAVENOUS

## 2017-04-07 MED ORDER — ONDANSETRON HCL 4 MG/2ML IJ SOLN
INTRAMUSCULAR | Status: AC
  Filled 2017-04-07: qty 2

## 2017-04-07 MED ORDER — SUGAMMADEX SODIUM 500 MG/5ML IV SOLN
INTRAVENOUS | Status: DC | PRN
  Administered 2017-04-07: 400 mg via INTRAVENOUS

## 2017-04-07 MED ORDER — GLYCOPYRROLATE 0.2 MG/ML IJ SOLN
INTRAMUSCULAR | Status: DC | PRN
  Administered 2017-04-07: 0.2 mg via INTRAVENOUS

## 2017-04-07 MED ORDER — MUPIROCIN 2 % EX OINT
TOPICAL_OINTMENT | CUTANEOUS | Status: AC
  Filled 2017-04-07: qty 22

## 2017-04-07 SURGICAL SUPPLY — 57 items
ATTRACTOMAT 16X20 MAGNETIC DRP (DRAPES) ×4 IMPLANT
BLADE RAD40 ROTATE 4M 4 5PK (BLADE) IMPLANT
BLADE RAD40 ROTATE 4M 4MM 5PK (BLADE)
BLADE RAD60 ROTATE M4 4 5PK (BLADE) IMPLANT
BLADE RAD60 ROTATE M4 4MM 5PK (BLADE)
BLADE ROTATE RAD 12 4 M4 (BLADE) IMPLANT
BLADE ROTATE RAD 12 4MM M4 (BLADE)
BLADE ROTATE RAD 40 4 M4 (BLADE) IMPLANT
BLADE ROTATE RAD 40 4MM M4 (BLADE)
BLADE ROTATE TRICUT 4MX13CM M4 (BLADE) ×1
BLADE ROTATE TRICUT 4X13 M4 (BLADE) ×3 IMPLANT
BLADE TRICUT ROTATE M4 4 5PK (BLADE) IMPLANT
BLADE TRICUT ROTATE M4 4MM 5PK (BLADE)
BUR HS RAD FRONTAL 3 (BURR) IMPLANT
BUR HS RAD FRONTAL 3MM (BURR)
CANISTER SUC SOCK COL 7IN (MISCELLANEOUS) ×8 IMPLANT
CANISTER SUCT 1200ML W/VALVE (MISCELLANEOUS) ×4 IMPLANT
COAGULATOR SUCT 6 FR SWTCH (ELECTROSURGICAL)
COAGULATOR SUCT 8FR VV (MISCELLANEOUS) ×4 IMPLANT
COAGULATOR SUCT SWTCH 10FR 6 (ELECTROSURGICAL) IMPLANT
DECANTER SPIKE VIAL GLASS SM (MISCELLANEOUS) IMPLANT
DRSG NASAL KENNEDY LMNT 8CM (GAUZE/BANDAGES/DRESSINGS) IMPLANT
DRSG NASOPORE 8CM (GAUZE/BANDAGES/DRESSINGS) ×4 IMPLANT
DRSG TELFA 3X8 NADH (GAUZE/BANDAGES/DRESSINGS) IMPLANT
ELECT REM PT RETURN 9FT ADLT (ELECTROSURGICAL) ×4
ELECTRODE REM PT RTRN 9FT ADLT (ELECTROSURGICAL) ×2 IMPLANT
GLOVE BIO SURGEON STRL SZ7.5 (GLOVE) ×4 IMPLANT
GLOVE SURG SS PI 7.0 STRL IVOR (GLOVE) ×4 IMPLANT
GOWN STRL REUS W/ TWL LRG LVL3 (GOWN DISPOSABLE) ×4 IMPLANT
GOWN STRL REUS W/TWL LRG LVL3 (GOWN DISPOSABLE) ×4
HEMOSTAT SURGICEL 2X14 (HEMOSTASIS) IMPLANT
IV NS 1000ML (IV SOLUTION)
IV NS 1000ML BAXH (IV SOLUTION) IMPLANT
IV NS 500ML (IV SOLUTION) ×2
IV NS 500ML BAXH (IV SOLUTION) ×2 IMPLANT
NEEDLE HYPO 25X1 1.5 SAFETY (NEEDLE) ×4 IMPLANT
NEEDLE PRECISIONGLIDE 27X1.5 (NEEDLE) IMPLANT
NEEDLE SPNL 25GX3.5 QUINCKE BL (NEEDLE) IMPLANT
NS IRRIG 1000ML POUR BTL (IV SOLUTION) ×4 IMPLANT
PACK BASIN DAY SURGERY FS (CUSTOM PROCEDURE TRAY) ×4 IMPLANT
PACK ENT DAY SURGERY (CUSTOM PROCEDURE TRAY) ×4 IMPLANT
PACKING NASAL EPIS 4X2.4 XEROG (MISCELLANEOUS) IMPLANT
PATTIES SURGICAL .5 X3 (DISPOSABLE) IMPLANT
SLEEVE SCD COMPRESS KNEE MED (MISCELLANEOUS) ×4 IMPLANT
SOLUTION BUTLER CLEAR DIP (MISCELLANEOUS) ×4 IMPLANT
SPLINT NASAL AIRWAY SILICONE (MISCELLANEOUS) IMPLANT
SPONGE GAUZE 2X2 8PLY STER LF (GAUZE/BANDAGES/DRESSINGS) ×1
SPONGE GAUZE 2X2 8PLY STRL LF (GAUZE/BANDAGES/DRESSINGS) ×3 IMPLANT
SPONGE NEURO XRAY DETECT 1X3 (DISPOSABLE) ×4 IMPLANT
TOWEL OR 17X24 6PK STRL BLUE (TOWEL DISPOSABLE) ×4 IMPLANT
TRACKER ENT INSTRUMENT (MISCELLANEOUS) ×4 IMPLANT
TRACKER ENT PATIENT (MISCELLANEOUS) ×4 IMPLANT
TUBE CONNECTING 20'X1/4 (TUBING) ×1
TUBE CONNECTING 20X1/4 (TUBING) ×3 IMPLANT
TUBE SALEM SUMP 16 FR W/ARV (TUBING) IMPLANT
TUBING STRAIGHTSHOT EPS 5PK (TUBING) ×4 IMPLANT
YANKAUER SUCT BULB TIP NO VENT (SUCTIONS) ×4 IMPLANT

## 2017-04-07 NOTE — Discharge Instructions (Addendum)

## 2017-04-07 NOTE — H&P (Signed)
Cc: Chronic nasal obstruction, frequent recurrent sinusitis  HPI: The patient is a 51 year old male who returns today for his follow-up evaluation.  The patient has a history of chronic nasal obstruction and frequent recurrent sinusitis.  He previously underwent sinus surgery in 1997 for similar issues. The patient recently underwent a paranasal sinus CT scan. The CT showed mucosal obstruction of his bilateral frontal ethmoidal recesses and bilateral sphenoid ostia.  In addition, there was also partial opacification of his bilateral ethmoid sinuses.  Both inferior turbinates were also noted to be hypertrophied, causing significant nasal obstruction.  The patient returns today complaining of persistent nasal congestion and frequent recurrent headache.  His facial pain centers around his forehead and periorbital areas.  The patient is currently on Flonase nasal spray, nasal saline irrigation and Atrovent nasal spray. No other ENT, GI, or respiratory issue noted since the last visit.   Exam General: Communicates without difficulty, well nourished, no acute distress. Head: Normocephalic, no evidence injury, no tenderness, facial buttresses intact without stepoff. Eyes: PERRL, EOMI. No scleral icterus, conjunctivae clear. Neuro: CN II exam reveals vision grossly intact.  No nystagmus at any point of gaze. Ears: Auricles well formed without lesions.  Ear canals are intact without mass or lesion.  No erythema or edema is appreciated.  The TMs are intact without fluid. Nose: External evaluation reveals normal support and skin without lesions.  Dorsum is intact.  Anterior rhinoscopy reveals congested and edematous mucosa over anterior aspect of the inferior turbinates and nasal septum.  No purulence is noted.  Oral:  Oral cavity and oropharynx are intact, symmetric, without erythema or edema.  Mucosa is moist without lesions. Neck: Full range of motion without pain.  There is no significant lymphadenopathy.  No masses  palpable.  Thyroid bed within normal limits to palpation.  Parotid glands and submandibular glands equal bilaterally without mass.  Trachea is midline. Neuro:  CN 2-12 grossly intact. Gait normal. Vestibular: No nystagmus at any point of gaze.   Assessment 1.  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  2.  Partial opacification of his bilateral ethmoid sinuses was also noted on the recent CT scan.  3.  Bilateral inferior turbinate hypertrophy, causing more than 95% obstructions bilaterally.   Plan  1.  The physical exam findings and the CT images are reviewed with the patient. Questions are invited and answered.  2.  The patient is instructed to continue his Flonase nasal spray and nasal saline irrigation.  3.  Based on the above findings, the patient may also benefit from surgical intervention with opening of his frontal ethmoidal recesses, sphenoid ostia, and total ethmoidectomies.  He may also benefit from reduction of his bilateral inferior turbinates.  4.  The risks, benefits, details of the procedure are extensively discussed.   5.  The patient would like to proceed with the procedures.

## 2017-04-07 NOTE — Anesthesia Preprocedure Evaluation (Signed)
Anesthesia Evaluation  Patient identified by MRN, date of birth, ID band Patient awake    Reviewed: Allergy & Precautions, NPO status , Patient's Chart, lab work & pertinent test results  Airway Mallampati: II  TM Distance: <3 FB Neck ROM: Full    Dental no notable dental hx.    Pulmonary asthma , former smoker,    Pulmonary exam normal breath sounds clear to auscultation       Cardiovascular hypertension, Normal cardiovascular exam Rhythm:Regular Rate:Normal     Neuro/Psych negative neurological ROS  negative psych ROS   GI/Hepatic negative GI ROS, Neg liver ROS,   Endo/Other  Morbid obesity  Renal/GU negative Renal ROS  negative genitourinary   Musculoskeletal negative musculoskeletal ROS (+)   Abdominal (+) + obese,   Peds negative pediatric ROS (+)  Hematology negative hematology ROS (+)   Anesthesia Other Findings   Reproductive/Obstetrics negative OB ROS                             Anesthesia Physical Anesthesia Plan  ASA: III  Anesthesia Plan: General   Post-op Pain Management:    Induction: Intravenous  PONV Risk Score and Plan: 2 and Ondansetron, Dexamethasone and Treatment may vary due to age or medical condition  Airway Management Planned: Oral ETT  Additional Equipment:   Intra-op Plan:   Post-operative Plan: Extubation in OR  Informed Consent: I have reviewed the patients History and Physical, chart, labs and discussed the procedure including the risks, benefits and alternatives for the proposed anesthesia with the patient or authorized representative who has indicated his/her understanding and acceptance.   Dental advisory given  Plan Discussed with: CRNA and Surgeon  Anesthesia Plan Comments:         Anesthesia Quick Evaluation

## 2017-04-07 NOTE — Anesthesia Procedure Notes (Signed)
Procedure Name: Intubation Performed by: Verita Lamb, CRNA Pre-anesthesia Checklist: Patient identified, Emergency Drugs available, Suction available, Patient being monitored and Timeout performed Patient Re-evaluated:Patient Re-evaluated prior to induction Preoxygenation: Pre-oxygenation with 100% oxygen Induction Type: IV induction Ventilation: Mask ventilation with difficulty, Two handed mask ventilation required and Oral airway inserted - appropriate to patient size Laryngoscope Size: Mac and 4 Grade View: Grade I Tube type: Oral Number of attempts: 1 Airway Equipment and Method: Stylet Placement Confirmation: ETT inserted through vocal cords under direct vision,  positive ETCO2,  CO2 detector and breath sounds checked- equal and bilateral Secured at: 24 cm Tube secured with: Tape Dental Injury: Teeth and Oropharynx as per pre-operative assessment  Difficulty Due To: Difficulty was anticipated Future Recommendations: Recommend- induction with short-acting agent, and alternative techniques readily available Comments: The patient had a MP 4 assessment preop.  Prepared for RSI induction with glidescope.  We used a mac 4 blade on glidescope and had a grade I view with the scope.  Easily inserted 7.0 ett through glottic opening and confirmed bbs and +etco2.  The patient was a moderately difficult mask with oral airway required and 2 hands.  Seal was difficult due to beard and anatomy.  A larger sized (XL) adult mask may have been better.  If the patient is able to shave prior to future anesthetics this would also help with masking.  mkelly

## 2017-04-07 NOTE — Transfer of Care (Signed)
Immediate Anesthesia Transfer of Care Note  Patient: Elliot DallyDavid Banwart  Procedure(s) Performed: BILATERAL TURBINATE REDUCTION (Bilateral Nose) BILATERAL FRONTAL RECESS SINUS EXPLORATION WITH SINUS FUSION NAVIGATION (Bilateral Nose) BILATERAL ETHMOIDECTOMY AND SPHENOIDECTOMY (Bilateral Nose)  Patient Location: PACU  Anesthesia Type:General  Level of Consciousness: awake, alert  and oriented  Airway & Oxygen Therapy: Patient Spontanous Breathing and Patient connected to face mask oxygen  Post-op Assessment: Report given to RN and Post -op Vital signs reviewed and stable  Post vital signs: Reviewed and stable  Last Vitals:  Vitals:   04/07/17 0734  BP: (!) 170/100  Pulse: (!) 104  Resp: 18  Temp: 36.8 C  SpO2: 98%    Last Pain:  Vitals:   04/07/17 0734  TempSrc: Oral  PainSc: 0-No pain         Complications: No apparent anesthesia complications

## 2017-04-07 NOTE — Op Note (Signed)
DATE OF PROCEDURE: 04/07/2017  OPERATIVE REPORT   SURGEON: Newman PiesSu Skylar Priest, MD   PREOPERATIVE DIAGNOSES:  1. Chronic nasal obstruction.  2. Bilateral inferior turbinate hypertrophy.  3. Bilateral chronic frontal, ethmoid, and sphenoid sinusitis.  POSTOPERATIVE DIAGNOSES:  1. Chronic nasal obstruction.  2. Bilateral inferior turbinate hypertrophy.  3. Bilateral chronic frontal, ethmoid, and sphenoid sinusitis.  PROCEDURE PERFORMED:  1. Bilateral endoscopic frontal sinusotomy 2. Bilateral endoscopic total ethmoidectomy and sphenoidotomy 3. Bilateral partial inferior turbinate resection.  4. FUSION stereotactic image guidance  ANESTHESIA: General endotracheal tube anesthesia.   COMPLICATIONS: None.   ESTIMATED BLOOD LOSS: 400ml  INDICATION FOR PROCEDURE :Scott Buchanan is a 51 y.o. male with a history of chronic nasal obstruction and frequent recurrent sinusitis. The patient was treated with multiple courses of antibiotics, steroid nasal sprays, systemic steroids, antihistamine, decongestant, and other over-the-counter allergy medications. The patient previously underwent endoscopic sinus surgery in 1997 for similar issues. The patient recently underwent a paranasal sinus CT scan. The CT showed mucosal obstruction of his bilateral frontal ethmoidal recesses and bilateral sphenoid ostia. In addition, there was also partial opacification of his bilateral ethmoid sinuses. Both inferior turbinates were noted to be hypertrophy, causing significant nasal obstruction. Based on the above findings, the decision was made for the patient to undergo the above-stated procedure. The risks, benefits, alternatives, and details of the procedure were discussed with the patient. Questions were invited and answered. Informed consent was obtained.   DESCRIPTION: The patient was taken to the operating room and placed supine on the operating table. General endotracheal tube anesthesia was administered by the  anesthesiologist. The patient was positioned and prepped and draped in a standard fashion for nasal surgery. Pledgets soaked with Afrin were placed in both nasal cavities. The pledgets were subsequently removed. FUSION stereotactic image guidance marker was placed. The image guidance system was functional throughout the case.  Examination of the nasal cavities revealed bilateral severe inferior turbinate hypertrophy. The inferior one-half of each inferior turbinate was crossclamped with a straight Kelly clamp. The inferior one-half of each inferior turbinate was then resected with a pair of cross cutting scissors. Hemostasis was achieved with suction electrocautery, under direct visual guidance of the zero-degree endoscope. Good hemostasis was achieved.   Attention was focused on the paranasal sinuses. Using a 0 scope, the left middle turbinate was carefully medialized. Polypoid tissue was noted within the middle meatus. The maxillary opening was widely patent secondary to his previous surgery. The polypoid tissue was removed using a combination of Tru-Cut forceps, Blakesley forceps, and microdebrider. The bony partitions of the anterior and posterior ethmoid cavities were taken down. Polypoid tissue obstructing the sphenoid ostium was also removed. The sphenoid opening was widely enlarged. The sphenoid sinus was copiously irrigated. Attention was then focused on the frontal sinus. The frontal ethmoidal recess was carefully entered and enlarged. Polypoid tissue was removed. The frontal sinus was copiously irrigated. The same procedure was repeated on the right site without exceptions. Polypoid tissue was also noted to obstruct the right ethmoid cavities and the frontoethmoidal recess. Nasopore packing was placed.  The care of the patient was turned over to the anesthesiologist. The patient was awakened from anesthesia without difficulty. The patient was extubated and transferred to the recovery room in good  condition.   OPERATIVE FINDINGS: Bilateral inferior turbinate hypertrophy. Bilateral chronic frontal, ethmoid, and sphenoid sinusitis. Polypoid tissue was noted to obstruct the middle meatus bilaterally.   SPECIMEN: Bilateral sinus contents.   FOLLOWUP CARE: The patient will be  discharged home once he is awake and alert. The patient will be placed on Percocet 1 p.o. q.4 h. p.r.n. pain, and amoxicillin 875 mg p.o. b.i.d. for 5 days. The patient will follow up in my office in approximately 1 week.   Newman PiesSu Tashira Torre, MD

## 2017-04-07 NOTE — Anesthesia Postprocedure Evaluation (Signed)
Anesthesia Post Note  Patient: Scott DallyDavid Buchanan  Procedure(s) Performed: BILATERAL TURBINATE REDUCTION (Bilateral Nose) BILATERAL FRONTAL RECESS SINUS EXPLORATION WITH SINUS FUSION NAVIGATION (Bilateral Nose) BILATERAL ETHMOIDECTOMY AND SPHENOIDECTOMY (Bilateral Nose)     Patient location during evaluation: PACU Anesthesia Type: General Level of consciousness: awake and alert Pain management: pain level controlled Vital Signs Assessment: post-procedure vital signs reviewed and stable Respiratory status: spontaneous breathing, nonlabored ventilation, respiratory function stable and patient connected to nasal cannula oxygen Cardiovascular status: blood pressure returned to baseline and stable Postop Assessment: no apparent nausea or vomiting Anesthetic complications: no    Last Vitals:  Vitals:   04/07/17 1130 04/07/17 1215  BP: (!) 150/76 (!) 149/88  Pulse: 99 (!) 102  Resp: 12 18  Temp:  37.1 C  SpO2: 97% 97%    Last Pain:  Vitals:   04/07/17 1215  TempSrc: Oral  PainSc:                  [name] S

## 2017-04-08 ENCOUNTER — Encounter (HOSPITAL_BASED_OUTPATIENT_CLINIC_OR_DEPARTMENT_OTHER): Payer: Self-pay | Admitting: Otolaryngology

## 2017-04-27 ENCOUNTER — Ambulatory Visit (INDEPENDENT_AMBULATORY_CARE_PROVIDER_SITE_OTHER): Payer: PRIVATE HEALTH INSURANCE | Admitting: Family Medicine

## 2017-04-27 ENCOUNTER — Encounter: Payer: Self-pay | Admitting: Family Medicine

## 2017-04-27 VITALS — BP 140/98 | HR 110 | Temp 98.3°F | Resp 16 | Ht 76.0 in | Wt 343.0 lb

## 2017-04-27 DIAGNOSIS — E669 Obesity, unspecified: Secondary | ICD-10-CM | POA: Diagnosis not present

## 2017-04-27 DIAGNOSIS — R739 Hyperglycemia, unspecified: Secondary | ICD-10-CM

## 2017-04-27 DIAGNOSIS — E1169 Type 2 diabetes mellitus with other specified complication: Secondary | ICD-10-CM | POA: Diagnosis not present

## 2017-04-27 DIAGNOSIS — Z23 Encounter for immunization: Secondary | ICD-10-CM

## 2017-04-27 LAB — POCT URINALYSIS DIP (DEVICE)
Bilirubin Urine: NEGATIVE
Glucose, UA: NEGATIVE mg/dL
HGB URINE DIPSTICK: NEGATIVE
Ketones, ur: NEGATIVE mg/dL
LEUKOCYTES UA: NEGATIVE
Nitrite: NEGATIVE
Protein, ur: NEGATIVE mg/dL
Specific Gravity, Urine: >=1.03 (ref 1.005–1.030)
UROBILINOGEN UA: 0.2 mg/dL (ref 0.0–1.0)
pH: 5.5 (ref 5.0–8.0)

## 2017-04-27 LAB — POCT GLYCOSYLATED HEMOGLOBIN (HGB A1C): HEMOGLOBIN A1C: 7.6

## 2017-04-27 MED ORDER — METFORMIN HCL 500 MG PO TABS: 500.0000 mg | ORAL_TABLET | Freq: Two times a day (BID) | ORAL | 3 refills | Status: DC

## 2017-04-27 NOTE — Patient Instructions (Signed)
He has a new diagnosis of type 2 diabetes mellitus.  Your hemoglobin A1c is 7.6, which is above goal.  Goal is less than 7.  Any hemoglobin A1c that is greater than 6.5 is consistent with type 2 diabetes mellitus.  We will start a trial of metformin 500 mg twice daily with meals.  Major side effect is typically GI upset so make sure that you are taking medication with food.     Recommend a carbohydrate modify diet divided over 5-6 small meals throughout the day.  Given a copy of an 1800-calorie carbohydrate modify diet.  I will also send a referral to diabetic education to assist with meal planning.  I have also sent a referral to ophthalmology for annual diabetic eye exam. We will perform diabetic foot exam every 3 months.  Also, check feet daily at home.    Diabetes Mellitus and Nutrition When you have diabetes (diabetes mellitus), it is very important to have healthy eating habits because your blood sugar (glucose) levels are greatly affected by what you eat and drink. Eating healthy foods in the appropriate amounts, at about the same times every day, can help you:  Control your blood glucose.  Lower your risk of heart disease.  Improve your blood pressure.  Reach or maintain a healthy weight.  Every person with diabetes is different, and each person has different needs for a meal plan. Your health care provider may recommend that you work with a diet and nutrition specialist (dietitian) to make a meal plan that is best for you. Your meal plan may vary depending on factors such as:  The calories you need.  The medicines you take.  Your weight.  Your blood glucose, blood pressure, and cholesterol levels.  Your activity level.  Other health conditions you have, such as heart or kidney disease.  How do carbohydrates affect me? Carbohydrates affect your blood glucose level more than any other type of food. Eating carbohydrates naturally increases the amount of glucose in your  blood. Carbohydrate counting is a method for keeping track of how many carbohydrates you eat. Counting carbohydrates is important to keep your blood glucose at a healthy level, especially if you use insulin or take certain oral diabetes medicines. It is important to know how many carbohydrates you can safely have in each meal. This is different for every person. Your dietitian can help you calculate how many carbohydrates you should have at each meal and for snack. Foods that contain carbohydrates include:  Bread, cereal, rice, pasta, and crackers.  Potatoes and corn.  Peas, beans, and lentils.  Milk and yogurt.  Fruit and juice.  Desserts, such as cakes, cookies, ice cream, and candy.  How does alcohol affect me? Alcohol can cause a sudden decrease in blood glucose (hypoglycemia), especially if you use insulin or take certain oral diabetes medicines. Hypoglycemia can be a life-threatening condition. Symptoms of hypoglycemia (sleepiness, dizziness, and confusion) are similar to symptoms of having too much alcohol. If your health care provider says that alcohol is safe for you, follow these guidelines:  Limit alcohol intake to no more than 1 drink per day for nonpregnant women and 2 drinks per day for men. One drink equals 12 oz of beer, 5 oz of wine, or 1 oz of hard liquor.  Do not drink on an empty stomach.  Keep yourself hydrated with water, diet soda, or unsweetened iced tea.  Keep in mind that regular soda, juice, and other mixers may contain a lot  of sugar and must be counted as carbohydrates.  What are tips for following this plan? Reading food labels  Start by checking the serving size on the label. The amount of calories, carbohydrates, fats, and other nutrients listed on the label are based on one serving of the food. Many foods contain more than one serving per package.  Check the total grams (g) of carbohydrates in one serving. You can calculate the number of servings of  carbohydrates in one serving by dividing the total carbohydrates by 15. For example, if a food has 30 g of total carbohydrates, it would be equal to 2 servings of carbohydrates.  Check the number of grams (g) of saturated and trans fats in one serving. Choose foods that have low or no amount of these fats.  Check the number of milligrams (mg) of sodium in one serving. Most people should limit total sodium intake to less than 2,300 mg per day.  Always check the nutrition information of foods labeled as "low-fat" or "nonfat". These foods may be higher in added sugar or refined carbohydrates and should be avoided.  Talk to your dietitian to identify your daily goals for nutrients listed on the label. Shopping  Avoid buying canned, premade, or processed foods. These foods tend to be high in fat, sodium, and added sugar.  Shop around the outside edge of the grocery store. This includes fresh fruits and vegetables, bulk grains, fresh meats, and fresh dairy. Cooking  Use low-heat cooking methods, such as baking, instead of high-heat cooking methods like deep frying.  Cook using healthy oils, such as olive, canola, or sunflower oil.  Avoid cooking with butter, cream, or high-fat meats. Meal planning  Eat meals and snacks regularly, preferably at the same times every day. Avoid going long periods of time without eating.  Eat foods high in fiber, such as fresh fruits, vegetables, beans, and whole grains. Talk to your dietitian about how many servings of carbohydrates you can eat at each meal.  Eat 4-6 ounces of lean protein each day, such as lean meat, chicken, fish, eggs, or tofu. 1 ounce is equal to 1 ounce of meat, chicken, or fish, 1 egg, or 1/4 cup of tofu.  Eat some foods each day that contain healthy fats, such as avocado, nuts, seeds, and fish. Lifestyle   Check your blood glucose regularly. Exercise at lea Carbohydrate Counting for Diabetes Mellitus, Adult Carbohydrate counting is  a method for keeping track of how many carbohydrates you eat. Eating carbohydrates naturally increases the amount of sugar (glucose) in the blood. Counting how many carbohydrates you eat helps keep your blood glucose within normal limits, which helps you manage your diabetes (diabetes mellitus). It is important to know how many carbohydrates you can safely have in each meal. This is different for every person. A diet and nutrition specialist (registered dietitian) can help you make a meal plan and calculate how many carbohydrates you should have at each meal and snack. Carbohydrates are found in the following foods:  Grains, such as breads and cereals.  Dried beans and soy products.  Starchy vegetables, such as potatoes, peas, and corn.  Fruit and fruit juices.  Milk and yogurt.  Sweets and snack foods, such as cake, cookies, candy, chips, and soft drinks.  How do I count carbohydrates? There are two ways to count carbohydrates in food. You can use either of the methods or a combination of both. Reading "Nutrition Facts" on packaged food The "Nutrition Facts" list is  included on the labels of almost all packaged foods and beverages in the [place] It includes:  The serving size.  Information about nutrients in each serving, including the grams (g) of carbohydrate per serving.  To use the "Nutrition Facts":  Decide how many servings you will have.  Multiply the number of servings by the number of carbohydrates per serving.  The resulting number is the total amount of carbohydrates that you will be having.  Learning standard serving sizes of other foods When you eat foods containing carbohydrates that are not packaged or do not include "Nutrition Facts" on the label, you need to measure the servings in order to count the amount of carbohydrates:  Measure the foods that you will eat with a food scale or measuring cup, if needed.  Decide how many standard-size servings you will  eat.  Multiply the number of servings by 15. Most carbohydrate-rich foods have about 15 g of carbohydrates per serving. ? For example, if you eat 8 oz (170 g) of strawberries, you will have eaten 2 servings and 30 g of carbohydrates (2 servings x 15 g = 30 g).  For foods that have more than one food mixed, such as soups and casseroles, you must count the carbohydrates in each food that is included.  The following list contains standard serving sizes of common carbohydrate-rich foods. Each of these servings has about 15 g of carbohydrates:   hamburger bun or  English muffin.   oz (15 mL) syrup.   oz (14 g) jelly.  1 slice of bread.  1 six-inch tortilla.  3 oz (85 g) cooked rice or pasta.  4 oz (113 g) cooked dried beans.  4 oz (113 g) starchy vegetable, such as peas, corn, or potatoes.  4 oz (113 g) hot cereal.  4 oz (113 g) mashed potatoes or  of a large baked potato.  4 oz (113 g) canned or frozen fruit.  4 oz (120 mL) fruit juice.  4-6 crackers.  6 chicken nuggets.  6 oz (170 g) unsweetened dry cereal.  6 oz (170 g) plain fat-free yogurt or yogurt sweetened with artificial sweeteners.  8 oz (240 mL) milk.  8 oz (170 g) fresh fruit or one small piece of fruit.  24 oz (680 g) popped popcorn.  Example of carbohydrate counting Sample meal  3 oz (85 g) chicken breast.  6 oz (170 g) brown rice.  4 oz (113 g) corn.  8 oz (240 mL) milk.  8 oz (170 g) strawberries with sugar-free whipped topping. Carbohydrate calculation 1. Identify the foods that contain carbohydrates: ? Rice. ? Corn. ? Milk. ? Strawberries. 2. Calculate how many servings you have of each food: ? 2 servings rice. ? 1 serving corn. ? 1 serving milk. ? 1 serving strawberries. 3. Multiply each number of servings by 15 g: ? 2 servings rice x 15 g = 30 g. ? 1 serving corn x 15 g = 15 g. ? 1 serving milk x 15 g = 15 g. ? 1 serving strawberries x 15 g = 15 g. 4. Add together all of  the amounts to find the total grams of carbohydrates eaten: ? 30 g + 15 g + 15 g + 15 g = 75 g of carbohydrates total. This information is not intended to replace advice given to you by your health care provider. Make sure you discuss any questions you have with your health care provider. Document Released: 04/07/2005 Document Revised: 10/26/2015 Document Reviewed:  09/19/2015 Elsevier Interactive Patient Education  2018 [employer]   Summary  A healthy meal plan will help you control your blood glucose and maintain a healthy lifestyle.  Working with a diet and nutrition specialist (dietitian) can help you make a meal plan that is best for you.  Keep in mind that carbohydrates and alcohol have immediate effects on your blood glucose levels. It is important to count carbohydrates and to use alcohol carefully. This information is not intended to replace advice given to you by your health care provider. Make sure you discuss any questions you have with your health care provider. Document Released: 01/02/2005 Document Revised: 05/12/2016 Document Reviewed: 05/12/2016 Elsevier Interactive Patient Education  2018 [employer]  Diabetes Mellitus and Exercise Exercising regularly is important for your overall health, especially when you have diabetes (diabetes mellitus). Exercising is not only about losing weight. It has many health benefits, such as increasing muscle strength and bone density and reducing body fat and stress. This leads to improved fitness, flexibility, and endurance, all of which result in better overall health. Exercise has additional benefits for people with diabetes, including:  Reducing appetite.  Helping to lower and control blood glucose.  Lowering blood pressure.  Helping to control amounts of fatty substances (lipids) in the blood, such as cholesterol and triglycerides.  Helping the body to respond better to insulin (improving insulin sensitivity).  Reducing how  much insulin the body needs.  Decreasing the risk for heart disease by: ? Lowering cholesterol and triglyceride levels. ? Increasing the levels of good cholesterol. ? Lowering blood glucose levels.  What is my activity plan? Your health care provider or certified diabetes educator can help you make a plan for the type and frequency of exercise (activity plan) that works for you. Make sure that you:  Do at least 150 minutes of moderate-intensity or vigorous-intensity exercise each week. This could be brisk walking, biking, or water aerobics. ? Do stretching and strength exercises, such as yoga or weightlifting, at least 2 times a week. ? Spread out your activity over at least 3 days of the week.  Get some form of physical activity every day. ? Do not go more than 2 days in a row without some kind of physical activity. ? Avoid being inactive for more than 90 minutes at a time. Take frequent breaks to walk or stretch.  Choose a type of exercise or activity that you enjoy, and set realistic goals.  Start slowly, and gradually increase the intensity of your exercise over time.  What do I need to know about managing my diabetes?  Check your blood glucose before and after exercising. ? If your blood glucose is higher than 240 mg/dL (45.413.3 mmol/L) before you exercise, check your urine for ketones. If you have ketones in your urine, do not exercise until your blood glucose returns to normal.  Know the symptoms of low blood glucose (hypoglycemia) and how to treat it. Your risk for hypoglycemia increases during and after exercise. Common symptoms of hypoglycemia can include: ? Hunger. ? Anxiety. ? Sweating and feeling clammy. ? Confusion. ? Dizziness or feeling light-headed. ? Increased heart rate or palpitations. ? Blurry vision. ? Tingling or numbness around the mouth, lips, or tongue. ? Tremors or shakes. ? Irritability.  Keep a rapid-acting carbohydrate snack available before, during,  and after exercise to help prevent or treat hypoglycemia.  Avoid injecting insulin into areas of the body that are going to be exercised. For example, avoid injecting  insulin into: ? The arms, when playing tennis. ? The legs, when jogging.  Keep records of your exercise habits. Doing this can help you and your health care provider adjust your diabetes management plan as needed. Write down: ? Food that you eat before and after you exercise. ? Blood glucose levels before and after you exercise. ? The type and amount of exercise you have done. ? When your insulin is expected to peak, if you use insulin. Avoid exercising at times when your insulin is peaking.  When you start a new exercise or activity, work with your health care provider to make sure the activity is safe for you, and to adjust your insulin, medicines, or food intake as needed.  Drink plenty of water while you exercise to prevent dehydration or heat stroke. Drink enough fluid to keep your urine clear or pale yellow. This information is not intended to replace advice given to you by your health care provider. Make sure you discuss any questions you have with your health care provider. Document Released: 06/28/2003 Document Revised: 10/26/2015 Document Reviewed: 09/17/2015 Elsevier Interactive Patient Education  2018 [employer].

## 2017-04-28 LAB — BASIC METABOLIC PANEL WITH GFR
BUN/Creatinine Ratio: 18 (ref 9–20)
BUN: 18 mg/dL (ref 6–24)
CO2: 19 mmol/L — ABNORMAL LOW (ref 20–29)
Calcium: 10.7 mg/dL — ABNORMAL HIGH (ref 8.7–10.2)
Chloride: 104 mmol/L (ref 96–106)
Creatinine, Ser: 1 mg/dL (ref 0.76–1.27)
GFR calc Af Amer: 102 mL/min/1.73 (ref >59)
GFR calc non Af Amer: 88 mL/min/1.73 (ref >59)
Glucose: 152 mg/dL — ABNORMAL HIGH (ref 65–99)
Potassium: 4 mmol/L (ref 3.5–5.2)
Sodium: 139 mmol/L (ref 134–144)

## 2017-05-01 ENCOUNTER — Telehealth: Payer: Self-pay

## 2017-05-01 NOTE — Telephone Encounter (Signed)
-----   Message from Massie MaroonLachina M Hollis, [place] sent at 04/30/2017  5:01 PM EST ----- Regarding: Lab results Please inform patient that all laboratory values were unremarkable.  Also, inquire how patient is tolerating metformin.  He was newly diagnosed with type 2 diabetes during his last appointment.  Reminded patient of the importance of following a low-fat low carbohydrate diet divided over 5-6 small meals and increasing water intake.  Also reminded patient of schedule follow-up appointment. Takes

## 2017-05-01 NOTE — Telephone Encounter (Signed)
Spoke with patient advised that all labs were unremarkable. He states he is doing well with the metformin, he says he is having diarrhea almost once a day but understands this is a normal side effect. I reminded him the importance of following a low fat- low fat carb diet over 5 to 6 small meals daily and to drink 6 to 8 glasses of water daily. He has an appointment scheduled to follow up in 3 months and will call if he needs anything before then. Thanks!

## 2017-05-02 NOTE — Progress Notes (Signed)
Subjective:    Patient ID: Scott Buchanan, male    DOB: [birth year], 52 y.o.   MRN: [id number]  HPI Scott Buchanan, a 52 year old male with a history of hypertension and asthma presents for a post surgical follow up. Patient underwent a total ethmoidectomy and sphenoidotomy due to chronic nasal obstruction and sphenoid sinusitis.Patient has a follow up scheduled with Dr. Newman PiesSu Teoh. Patient says that she feels well and is without complaint.  Patient reports that blood sugar was moderately elevated prior to procedure. He was instructed to follow up with PCP for further evaluation of blood sugar..  Patient denies foot ulcerations, increase appetite, nausea, paresthesia of the feet, polydipsia, polyuria, visual disturbances, vomitting and weight loss.      Past Medical History:  Diagnosis Date  . Asthma   . Hypertension    Social History   Socioeconomic History  . Marital status: Single    Spouse name: Not on file  . Number of children: Not on file  . Years of education: Not on file  . Highest education level: Not on file  Social Needs  . Financial resource strain: Not on file  . Food insecurity - worry: Not on file  . Food insecurity - inability: Not on file  . Transportation needs - medical: Not on file  . Transportation needs - non-medical: Not on file  Occupational History  . Not on file  Tobacco Use  . Smoking status: Former Games developermoker  . Smokeless tobacco: Never Used  Substance and Sexual Activity  . Alcohol use: No  . Drug use: No  . Sexual activity: Yes  Other Topics Concern  . Not on file  Social History Narrative  . Not on file   Immunization History  Administered Date(s) Administered  . Pneumococcal Polysaccharide-23 04/27/2017  . Tdap 10/21/2016   No Known Allergies  Review of Systems  Constitutional: Negative.   HENT: Negative.   Eyes: Negative.   Respiratory: Negative.   Cardiovascular: Negative.   Gastrointestinal: Negative.   Endocrine: Negative for  polydipsia, polyphagia and polyuria.  Genitourinary: Negative.   Musculoskeletal: Negative.   Skin: Negative.   Allergic/Immunologic: Negative for immunocompromised state.  Neurological: Negative.   Hematological: Negative.   Psychiatric/Behavioral: Negative.        Objective:   Physical Exam  Constitutional: He is oriented to person, place, and time. He appears well-developed and well-nourished.  HENT:  Head: Normocephalic.  Right Ear: External ear normal.  Mouth/Throat: Oropharynx is clear and moist.  Eyes: Conjunctivae are normal. Pupils are equal, round, and reactive to light.  Neck: Normal range of motion. Neck supple.  Cardiovascular: Normal rate, regular rhythm, normal heart sounds and intact distal pulses.  Pulmonary/Chest: Effort normal and breath sounds normal.  Abdominal: Soft. Bowel sounds are normal.  Neurological: He is alert and oriented to person, place, and time. He has normal reflexes.  Skin: Skin is warm and dry.  Psychiatric: He has a normal mood and affect. His behavior is normal. Judgment and thought content normal.         BP (!) 140/98 (BP Location: Left Arm, Patient Position: Sitting, Cuff Size: Large) Comment: manually  Pulse (!) 110   Temp 98.3 F (36.8 C) (Oral)   Resp 16   Ht 6\' 4"  (1.93 m)   Wt (!) 343 lb (155.6 kg)   SpO2 97%   BMI 41.75 kg/m  Assessment & Plan:  Diabetes mellitus type 2 in obese (HCC) Hemoglobin a1C is 7.6, which is  consistent with type 2 diabetes mellitus. Discussed type 2 diabetes at length. Patient expressed understanding.  Recommend a lowfat, low carbohydrate diet divided over 5-6 small meals, increase water intake to 6-8 glasses, and 150 minutes per week of cardiovascular exercise.  Your A1C goal is less than 7. Your fasting blood sugar  Upon awakening goal is between 110-140.  Blood pressure goal is <140/90.   - metFORMIN (GLUCOPHAGE) 500 MG tablet; Take 1 tablet (500 mg total) by mouth 2 (two) times daily with a  meal.  Dispense: 180 tablet; Refill: 3 - Ambulatory referral to diabetic education - Ambulatory referral to Ophthalmology - Basic Metabolic Panel  Elevated serum glucose - HgB A1c   Immunization due - Pneumococcal polysaccharide vaccine 23-valent greater than or equal to 2yo subcutaneous/IM   RTC: 3 months for DMII  Nolon Nations  MSN, FNP-C [facility] [address] [place], [place] [address] [phone]

## 2017-05-07 ENCOUNTER — Other Ambulatory Visit: Payer: Self-pay | Admitting: Family Medicine

## 2017-05-07 DIAGNOSIS — J3089 Other allergic rhinitis: Secondary | ICD-10-CM

## 2017-05-12 ENCOUNTER — Encounter: Payer: Self-pay | Admitting: Registered"

## 2017-05-12 ENCOUNTER — Encounter: Payer: PRIVATE HEALTH INSURANCE | Attending: Family Medicine | Admitting: Registered"

## 2017-05-12 DIAGNOSIS — E119 Type 2 diabetes mellitus without complications: Secondary | ICD-10-CM

## 2017-05-12 DIAGNOSIS — E1165 Type 2 diabetes mellitus with hyperglycemia: Secondary | ICD-10-CM | POA: Insufficient documentation

## 2017-05-12 DIAGNOSIS — E669 Obesity, unspecified: Secondary | ICD-10-CM | POA: Diagnosis not present

## 2017-05-12 DIAGNOSIS — E1169 Type 2 diabetes mellitus with other specified complication: Secondary | ICD-10-CM | POA: Diagnosis present

## 2017-05-12 DIAGNOSIS — Z713 Dietary counseling and surveillance: Secondary | ICD-10-CM | POA: Diagnosis not present

## 2017-05-12 HISTORY — DX: Type 2 diabetes mellitus without complications: E11.9

## 2017-05-12 NOTE — Patient Instructions (Signed)
Plan:  Aim for 4-5 Carb Choices per meal (60-75 grams)  Aim for 0-2 Carbs (0-30 grams) per snack if hungry  Include protein with your meals and snacks Consider reading food labels for Total Carbohydrate  Consider checking blood sugar at alternate times per day  Continue taking medication as directed by MD

## 2017-05-12 NOTE — Progress Notes (Signed)
Diabetes Self-Management Education  Visit Type: First/Initial  Appt. Start Time: 0930 Appt. End Time: 1100  05/12/2017  [name], identified by name and date of birth, is a 52 y.o. male with a diagnosis of Diabetes: Type 2.   ASSESSMENT Patient states when he was diagnosed with T2DM his MD provided some dietary guidance and has made some changes including cutting down on portions, aiming to eat lower fat foods (switched to skim milk) and reduced the amount of desserts, also stopped drinking diet Dr. Reino KentPepper.   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   Patient states he would like to start SMBG, RD provided meter and instruction how to use it: Meter provided: Accu-chek Guide Lot # [id number] Exp: 06/14/2018  Diabetes Self-Management Education - 05/12/17 0949      Visit Information   Visit Type  First/Initial      Initial Visit   Diabetes Type  Type 2    Are you currently following a meal plan?  No    Are you taking your medications as prescribed?  Yes    Date Diagnosed  Apr 27, 2017      Health Coping   How would you rate your overall health?  Fair      Psychosocial Assessment   Patient Belief/Attitude about Diabetes  Motivated to manage diabetes    How often do you need to have someone help you when you read instructions, pamphlets, or other written materials from your doctor or pharmacy?  1 - Never    What is the last grade level you completed in school?  AS Degree      Complications   Last HgB A1C per patient/outside source  7.6 %    How often do you check your blood sugar?  0 times/day (not  testing)    Have you had a dilated eye exam in the past 12 months?  No    Have you had a dental exam in the past 12 months?  No    Are you checking your feet?  Yes    How many days per week are you checking your feet?  4      Dietary Intake   Breakfast  eggs, toast, hashbrowns weekends OR 1/2 bowl cinnamon chex, skim milk OR toast OR apple    Snack (morning)  none    Lunch  bread, [place]key, cheese, mayo, sm bag of cheips OR ww cheese nabs    Snack (afternoon)  none    Dinner  grilled chkn salad, baked potato OR [place]key burger tacos    Snack (evening)  none OR wheat crackers OR apple    Beverage(s)  water (used to drink diet soda), vitamin water, unsweet tea      Exercise   Exercise Type  Moderate (swimming / aerobic walking)    How many days per week to you exercise?  3    How many minutes per day do you exercise?  60    Total minutes per week of exercise  180      Patient Education   Previous Diabetes Education  No    Disease state   Definition of diabetes, type 1 and 2, and the diagnosis of diabetes    Nutrition management   Role of diet in the treatment of diabetes and the relationship between the three main macronutrients and blood glucose level;Carbohydrate counting;Food label reading, portion sizes and measuring food.    Monitoring  Identified appropriate SMBG and/or A1C goals.;Taught/evaluated SMBG meter.;Taught/discussed recording of test results and interpretation of SMBG.    Chronic complications  Relationship between chronic complications and blood glucose control      Individualized Goals (developed by patient)   Nutrition  General guidelines for healthy choices and portions discussed    Medications  take my medication as prescribed    Monitoring   test my blood glucose as discussed      Outcomes   Expected Outcomes  Demonstrated interest in learning. Expect positive outcomes    Future DMSE  4-6 wks    Program Status  Not Completed     Individualized Plan for Diabetes  Self-Management Training:   Learning Objective:  Patient will have a greater understanding of diabetes self-management. Patient education plan is to attend individual and/or group sessions per assessed needs and concerns.   Patient Instructions  Plan:  Aim for 4-5 Carb Choices per meal (60-75 grams)  Aim for 0-2 Carbs (0-30 grams) per snack if hungry  Include protein with your meals and snacks Consider reading food labels for Total Carbohydrate  Consider checking blood sugar at alternate times per day  Continue taking medication as directed by MD  Expected Outcomes:  Demonstrated interest in learning. Expect positive outcomes  Education material provided: Living Well with Diabetes, A1C conversion sheet, Meal plan card and Snack sheet  If problems or questions, patient to contact team via:  Phone  Future DSME appointment: 4-6 wks

## 2017-05-28 ENCOUNTER — Other Ambulatory Visit: Payer: Self-pay | Admitting: Family Medicine

## 2017-05-28 DIAGNOSIS — I10 Essential (primary) hypertension: Secondary | ICD-10-CM

## 2017-06-18 ENCOUNTER — Ambulatory Visit: Payer: PRIVATE HEALTH INSURANCE | Admitting: Family Medicine

## 2017-06-23 ENCOUNTER — Encounter: Payer: PRIVATE HEALTH INSURANCE | Attending: Family Medicine | Admitting: Registered"

## 2017-06-23 DIAGNOSIS — Z713 Dietary counseling and surveillance: Secondary | ICD-10-CM | POA: Insufficient documentation

## 2017-06-23 DIAGNOSIS — E1169 Type 2 diabetes mellitus with other specified complication: Secondary | ICD-10-CM | POA: Diagnosis not present

## 2017-06-23 DIAGNOSIS — E119 Type 2 diabetes mellitus without complications: Secondary | ICD-10-CM

## 2017-06-23 DIAGNOSIS — E669 Obesity, unspecified: Secondary | ICD-10-CM | POA: Insufficient documentation

## 2017-06-23 NOTE — Progress Notes (Signed)
Diabetes Self-Management Education  Visit Type: Follow-up  Appt. Start Time: 0915 Appt. End Time: 0945  06/24/2017  [name], identified by name and date of birth, is a 52 y.o. male with a diagnosis of Diabetes:  .   ASSESSMENT Patient states he has made changes since last visit including eating smaller portions and trying new vegetables recipes. Patient reports coworkers have noticed and commented on changes he has made. Patient states he doesn't like humus or avocados and bell peppers give indigestion. Patient states he would like more ideas for non-starchy vegetables as well as more low sugar cereal ideas. Pt states he only drinks water except on Sunday morning drinks large glass of orange juice.  Pt states he hasn't checked BG in a couple of weeks, but when he stopped checking his FBG was averaging in low 100s, and about the same 2 hrs after dinner.   Diabetes Self-Management Education - 06/23/17 0911      Visit Information   Visit Type  Follow-up      Initial Visit   Are you taking your medications as prescribed?  Yes  (Pended)       Complications   How often do you check your blood sugar?  3-4 times / week  (Pended)     Fasting Blood glucose range (mg/dL)  19-147  (Pended)     Postprandial Blood glucose range (mg/dL)  829-562  (Pended)     Number of hypoglycemic episodes per month  0  (Pended)     Number of hyperglycemic episodes per week  0  (Pended)       Dietary Intake   Breakfast  eggs, toast, sausage OR most days cereal, nuts dry, apple    Snack (morning)  pretzels, PB    Lunch  burger, salad bar  (Pended)     Snack (afternoon)  wheat thins OR apple  (Pended)     Dinner  Chef salad  (Pended)     Snack (evening)  nuts  (Pended)     Beverage(s)  water, tall glass OJ with Sunday breakfast  (Pended)       Patient Education   Nutrition management   Other (comment)  (Pended)  pt had question about starchy vs non-starchy veggies      Individualized Goals (developed  by patient)   Nutrition  General guidelines for healthy choices and portions discussed  (Pended)     Medications  take my medication as prescribed  (Pended)     Monitoring   test my blood glucose as discussed  (Pended)       Outcomes   Expected Outcomes  Demonstrated interest in learning. Expect positive outcomes  (Pended)     Future DMSE  PRN  (Pended)     Program Status  Completed  (Pended)       Subsequent Visit   Since your last visit have you continued or begun to take your medications as prescribed?  Yes  (Pended)      Individualized Plan for Diabetes Self-Management Training:   Learning Objective:  Patient will have a greater understanding of diabetes self-management. Patient education plan is to attend individual and/or group sessions per assessed needs and concerns.   Patient Instructions  You have starting finding interesting vegetable recipes, keep up the good work getting more veggies in your diet. Use cereal list to get more ideas Keep checking your BG at random times to make sure you are staying on track. Consider having a smaller glass  of OJ with your Sunday morning breakfast.  Expected Outcomes:  (P) Demonstrated interest in learning. Expect positive outcomes  Education material provided: Snack sheet, Meal ideas worksheet, link to [employer] Mixing Bowl.  If problems or questions, patient to contact team via:  Phone  Future DSME appointment: (P) PRN

## 2017-06-23 NOTE — Patient Instructions (Signed)
You have starting finding interesting vegetable recipes, keep up the good work getting more veggies in your diet. Use cereal list to get more ideas Keep checking your BG at random times to make sure you are staying on track. Consider having a smaller glass of OJ with your Sunday morning breakfast.

## 2017-07-27 ENCOUNTER — Encounter: Payer: Self-pay | Admitting: Family Medicine

## 2017-07-27 ENCOUNTER — Ambulatory Visit (INDEPENDENT_AMBULATORY_CARE_PROVIDER_SITE_OTHER): Payer: PRIVATE HEALTH INSURANCE | Admitting: Family Medicine

## 2017-07-27 VITALS — BP 134/90 | HR 87 | Temp 98.2°F | Resp 16 | Ht 76.0 in | Wt 325.0 lb

## 2017-07-27 DIAGNOSIS — M79674 Pain in right toe(s): Secondary | ICD-10-CM

## 2017-07-27 DIAGNOSIS — I10 Essential (primary) hypertension: Secondary | ICD-10-CM

## 2017-07-27 HISTORY — DX: Morbid (severe) obesity due to excess calories: E66.01

## 2017-07-27 LAB — POCT URINALYSIS DIPSTICK
Bilirubin, UA: NEGATIVE
Glucose, UA: NEGATIVE
Ketones, UA: NEGATIVE
Leukocytes, UA: NEGATIVE
NITRITE UA: NEGATIVE
PH UA: 5.5 (ref 5.0–8.0)
Protein, UA: NEGATIVE
RBC UA: NEGATIVE
Spec Grav, UA: 1.015 (ref 1.010–1.025)
UROBILINOGEN UA: 0.2 U/dL

## 2017-07-27 LAB — POCT GLYCOSYLATED HEMOGLOBIN (HGB A1C): Hemoglobin A1C: 5.6

## 2017-07-27 MED ORDER — KETOROLAC TROMETHAMINE 60 MG/2ML IM SOLN
60.0000 mg | Freq: Once | INTRAMUSCULAR | Status: AC
  Administered 2017-07-27: 60 mg via INTRAMUSCULAR

## 2017-07-27 MED ORDER — MELOXICAM 7.5 MG PO TABS: 7.5000 mg | ORAL_TABLET | Freq: Every day | ORAL | 0 refills | Status: DC

## 2017-07-27 NOTE — Patient Instructions (Signed)

## 2017-07-27 NOTE — Progress Notes (Signed)
Elliot DallyDavid Nerio, a 52 year old male, presents for a 3 month follow up. He has a history of hypertension, hyperlipidemia, and obesity. Mr. [name] has been taking all medications consistently. He did not start a routine exercise regimen or follow a low fat, low carbohydrate diet. He does not check blood pressure at home Patient denies chest pain, dyspnea, fatigue, lower extremity edema, palpitations, syncope and tachypnea.  Cardiovascular risk factors include: dyslipidemia, hypertension, obesity (BMI >= 30 kg/m2) and smoking/ tobacco exposure..  Ms. [name] is also complaining of right great toe pain for the last several days.  Patient states that he has a history of gout and pain is similar to previous gout attack.  Patient is not taking any over-the-counter analgesics to assist with problem.  Current pain intensity is 5/10 characterized as intermittent and aching.     Past Medical History:  Diagnosis Date  . Asthma   . Hypertension    Social History   Socioeconomic History  . Marital status: Single    Spouse name: Not on file  . Number of children: Not on file  . Years of education: Not on file  . Highest education level: Not on file  Occupational History  . Not on file  Social Needs  . Financial resource strain: Not on file  . Food insecurity:    Worry: Not on file    Inability: Not on file  . Transportation needs:    Medical: Not on file    Non-medical: Not on file  Tobacco Use  . Smoking status: Former Games developermoker  . Smokeless tobacco: Never Used  Substance and Sexual Activity  . Alcohol use: No  . Drug use: No  . Sexual activity: Yes  Lifestyle  . Physical activity:    Days per week: Not on file    Minutes per session: Not on file  . Stress: Not on file  Relationships  . Social connections:    Talks on phone: Not on file    Gets together: Not on file    Attends religious service: Not on file    Active member of club or organization: Not on file    Attends meetings of clubs or  organizations: Not on file    Relationship status: Not on file  . Intimate partner violence:    Fear of current or ex partner: Not on file    Emotionally abused: Not on file    Physically abused: Not on file    Forced sexual activity: Not on file  Other Topics Concern  . Not on file  Social History Narrative  . Not on file     Review of Systems  HENT: Negative.   Eyes: Negative.   Cardiovascular: Negative.  Negative for chest pain, palpitations and leg swelling.  Gastrointestinal: Negative.   Genitourinary: Negative.  Negative for dysuria, hematuria and urgency.  Musculoskeletal: Negative.        Right great toe pain  Skin: Negative.   Neurological: Negative.  Negative for dizziness and headaches.  Endo/Heme/Allergies: Negative.   Psychiatric/Behavioral: Negative.  Negative for depression.    Physical Exam  Constitutional: He is oriented to person, place, and time and well-developed, well-nourished, and in no distress.  Morbid obesity  HENT:  Head: Normocephalic and atraumatic.  Right Ear: External ear normal.  Left Ear: External ear normal.  Eyes: Pupils are equal, round, and reactive to light. Conjunctivae and EOM are normal.  Neck: Normal range of motion. Neck supple.  Cardiovascular: Normal rate, regular  rhythm, normal heart sounds and intact distal pulses.  Pulmonary/Chest: No respiratory distress.  Abdominal: Soft. Bowel sounds are normal.  Increased abdominal girth  Musculoskeletal: Normal range of motion.  Neurological: He is alert and oriented to person, place, and time. Gait normal.  Skin: Skin is warm and dry.  Psychiatric: Mood, memory, affect and judgment normal.  BP 134/90 (BP Location: Right Arm, Patient Position: Sitting, Cuff Size: Large) Comment: manually  Pulse 87   Temp 98.2 F (36.8 C) (Oral)   Resp 16   Ht 6\' 4"  (1.93 m)   Wt (!) 325 lb (147.4 kg)   SpO2 97%   BMI 39.56 kg/m    1. Essential hypertension Blood pressure is at goal on today,  no medication changes warranted.  Goal is less than 140/90.- Continue medication, monitor blood pressure at home. Continue DASH diet. Reminded to go to the ER if any CP, SOB, nausea, dizziness, severe HA, changes vision/speech, left arm numbness and tingling and jaw pain.  - HgB A1c - Urinalysis Dipstick - Basic Metabolic Panel  2. Great toe pain, right Recommend low purine diet.  We will also start a trial of meloxicam 7.5 mg daily.  We will follow-up by phone with any abnormal laboratory results. - Arthritis Panel - ketorolac (TORADOL) injection 60 mg - meloxicam (MOBIC) 7.5 MG tablet; Take 1 tablet (7.5 mg total) by mouth daily.  Dispense: 30 tablet; Refill: 0  3. Morbid obesity (HCC) Body mass index is 39.56 kg/m. Recommend a lowfat, low carbohydrate diet divided over 5-6 small meals, increase water intake to 6-8 glasses, and 150 minutes per week of cardiovascular exercise.   RTC: 3 months for hypertension  Nolon Nations  MSN, FNP-C [facility] [address] [place], [place] [address] [phone]

## 2017-07-28 LAB — BASIC METABOLIC PANEL
BUN / CREAT RATIO: 18 (ref 9–20)
BUN: 18 mg/dL (ref 6–24)
CALCIUM: 10.6 mg/dL — AB (ref 8.7–10.2)
CHLORIDE: 102 mmol/L (ref 96–106)
CO2: 19 mmol/L — ABNORMAL LOW (ref 20–29)
CREATININE: 1.02 mg/dL (ref 0.76–1.27)
GFR calc non Af Amer: 85 mL/min/{1.73_m2} (ref >59)
GFR, EST AFRICAN AMERICAN: 99 mL/min/{1.73_m2} (ref >59)
GLUCOSE: 81 mg/dL (ref 65–99)
Potassium: 4.2 mmol/L (ref 3.5–5.2)
Sodium: 138 mmol/L (ref 134–144)

## 2017-07-28 LAB — ARTHRITIS PANEL
Anti Nuclear Antibody(ANA): NEGATIVE
Rhuematoid fact SerPl-aCnc: <10 IU/mL (ref 0.0–13.9)
SED RATE: 14 mm/h (ref 0–30)
URIC ACID: 7.5 mg/dL (ref 3.7–8.6)

## 2017-08-27 ENCOUNTER — Other Ambulatory Visit: Payer: Self-pay | Admitting: Family Medicine

## 2017-08-27 DIAGNOSIS — J3089 Other allergic rhinitis: Secondary | ICD-10-CM

## 2017-10-18 ENCOUNTER — Other Ambulatory Visit: Payer: Self-pay | Admitting: Family Medicine

## 2017-10-18 DIAGNOSIS — I10 Essential (primary) hypertension: Secondary | ICD-10-CM

## 2017-10-28 ENCOUNTER — Ambulatory Visit: Payer: PRIVATE HEALTH INSURANCE | Admitting: Family Medicine

## 2017-11-05 ENCOUNTER — Ambulatory Visit (INDEPENDENT_AMBULATORY_CARE_PROVIDER_SITE_OTHER): Payer: PRIVATE HEALTH INSURANCE | Admitting: Family Medicine

## 2017-11-05 ENCOUNTER — Encounter: Payer: Self-pay | Admitting: Family Medicine

## 2017-11-05 VITALS — BP 138/90 | HR 90 | Temp 98.8°F | Resp 18 | Ht 76.0 in | Wt 326.0 lb

## 2017-11-05 DIAGNOSIS — Z1211 Encounter for screening for malignant neoplasm of colon: Secondary | ICD-10-CM

## 2017-11-05 DIAGNOSIS — I10 Essential (primary) hypertension: Secondary | ICD-10-CM | POA: Diagnosis not present

## 2017-11-05 DIAGNOSIS — H65112 Acute and subacute allergic otitis media (mucoid) (sanguinous) (serous), left ear: Secondary | ICD-10-CM

## 2017-11-05 DIAGNOSIS — E669 Obesity, unspecified: Secondary | ICD-10-CM | POA: Diagnosis not present

## 2017-11-05 DIAGNOSIS — Z114 Encounter for screening for human immunodeficiency virus [HIV]: Secondary | ICD-10-CM

## 2017-11-05 DIAGNOSIS — E1169 Type 2 diabetes mellitus with other specified complication: Secondary | ICD-10-CM | POA: Diagnosis not present

## 2017-11-05 DIAGNOSIS — J0100 Acute maxillary sinusitis, unspecified: Secondary | ICD-10-CM

## 2017-11-05 LAB — POCT URINALYSIS DIPSTICK
Bilirubin, UA: NEGATIVE
Blood, UA: NEGATIVE
Glucose, UA: NEGATIVE
Ketones, UA: NEGATIVE
Leukocytes, UA: NEGATIVE
Nitrite, UA: NEGATIVE
Protein, UA: NEGATIVE
Spec Grav, UA: 1.02 (ref 1.010–1.025)
Urobilinogen, UA: 0.2 E.U./dL
pH, UA: 5.5 (ref 5.0–8.0)

## 2017-11-05 LAB — POCT GLYCOSYLATED HEMOGLOBIN (HGB A1C): Hemoglobin A1C: 5.7 % — AB (ref 4.0–5.6)

## 2017-11-05 MED ORDER — AMOXICILLIN-POT CLAVULANATE 875-125 MG PO TABS: 1.0000 | ORAL_TABLET | Freq: Two times a day (BID) | ORAL | 0 refills | Status: AC

## 2017-11-05 NOTE — Progress Notes (Signed)
Diabetes Follow-Up Visit Chief Complaint:   Chief Complaint  Patient presents with  . Hypertension  HPI:  Patient presents for follow up on HTN and DM2. Patient states that he is doing well on medications and taking them as previously prescribed. He is not following a low carb diet. Participates in physical activity on his job.  Has hx of chronic frontal sinusitus and is being follwed by ENT for this. Has a follw up appts soon. C/o headache and sinus pain for the past 10 days. Has used nasal saline without relief  Low fat/carbohydrate diet?  No Nicotine Abuse?  No Medication Compliance?  Yes Exercise?  No Alcohol Abuse?  No  Home BG Monitoring:  Checking 0 times a day. Average:  Patient is not checking bG at home.   Regularity:  No complaints Edema: bilateral lower extermities   Polyuria:  denies  Polydipsia:  denies  Polyphagia: denies  BMI:  Body mass index is 39.68 kg/m.   Weight changes:  None reported  Physical Exam  Constitutional: He is oriented to person, place, and time. He appears well-developed and well-nourished.  HENT:  Head: Normocephalic and atraumatic.  Right Ear: External ear normal.  Left Ear: External ear normal.  Nose: Mucosal edema and rhinorrhea present. Right sinus exhibits frontal sinus tenderness. Left sinus exhibits frontal sinus tenderness.  Mouth/Throat: Oropharynx is clear and moist.  Eyes: Pupils are equal, round, and reactive to light. Conjunctivae and EOM are normal.  Neck: Normal range of motion. Neck supple. JVD present.  Cardiovascular: Normal rate, regular rhythm and intact distal pulses. Exam reveals friction rub.  Murmur heard. Respiratory: Effort normal and breath sounds normal. No respiratory distress. He has no wheezes.  GI: Soft. Bowel sounds are normal. He exhibits no distension. There is no tenderness.  Musculoskeletal: Normal range of motion. He exhibits edema (bilateral lower extremities. ).  Lymphadenopathy:    He has no cervical  adenopathy.  Neurological: He is alert and oriented to person, place, and time.  Skin: Skin is warm and dry.  Psychiatric: He has a normal mood and affect. His behavior is normal. Judgment and thought content normal.  .       Lab Results  Component Value Date   HGBA1C 5.7 (A) 11/05/2017    Lab Results  Component Value Date   MICROALBUR 5.6 10/21/2016    No results found for: CHOL, HDL, LDLCALC, LDLDIRECT, TRIG, CHOLHDL   Future labs ordered today.  Assessment: 1.  Diabetes.  Continue with current medications as A1C is 5.7 today 2.  Blood Pressure. Controlled at today's visit  Essential hypertension Continue with current medications  - Urinalysis Dipstick - CBC with Differential; Future - Comprehensive metabolic panel; Future - Lipid Panel; Future  Morbid obesity (HCC) - Lipid Panel; Future - TSH; Future   Diabetes mellitus type 2 in obese (HCC) Continue with current medications - HgB A1c   Screening for HIV (human immunodeficiency virus) - HIV antibody (with reflex); Future  Acute maxillary sinusitis, recurrence not specified - amoxicillin-clavulanate (AUGMENTIN) 875-125 MG tablet; Take 1 tablet by mouth every 12 (twelve) hours for 7 days.  Dispense: 14 tablet; Refill: 0  Screen for colon cancer - Cologuard  Recommendations: 1.  Patient is counseled on appropriate foot care. 2.  BP goal < 130/80. 3.  LDL goal of < 100, HDL > 40 and TG < 150. 4.  Eye Exam yearly and Dental Exam every 6 months. 5.  Dietary recommendations:  Carbohydrate modified diet 6.  Physical Activity recommendations:  30 minutes of fast paced walking 5 days a week 7.  Medication recommendations at this time are as follows: continue with current medications.  8.  Return to clinic in 3-4 months

## 2017-11-05 NOTE — Patient Instructions (Signed)

## 2017-11-09 ENCOUNTER — Other Ambulatory Visit: Payer: PRIVATE HEALTH INSURANCE

## 2017-11-09 DIAGNOSIS — Z114 Encounter for screening for human immunodeficiency virus [HIV]: Secondary | ICD-10-CM

## 2017-11-09 DIAGNOSIS — I10 Essential (primary) hypertension: Secondary | ICD-10-CM

## 2017-11-10 LAB — CBC WITH DIFFERENTIAL/PLATELET
Basophils Absolute: 0.1 10*3/uL (ref 0.0–0.2)
Basos: 1 %
EOS (ABSOLUTE): 0.1 10*3/uL (ref 0.0–0.4)
Eos: 1 %
Hematocrit: 35.6 % — ABNORMAL LOW (ref 37.5–51.0)
Hemoglobin: 12.4 g/dL — ABNORMAL LOW (ref 13.0–17.7)
Immature Grans (Abs): 0 10*3/uL (ref 0.0–0.1)
Immature Granulocytes: 0 %
Lymphocytes Absolute: 5.3 10*3/uL — ABNORMAL HIGH (ref 0.7–3.1)
Lymphs: 59 %
MCH: 28.8 pg (ref 26.6–33.0)
MCHC: 34.8 g/dL (ref 31.5–35.7)
MCV: 83 fL (ref 79–97)
Monocytes Absolute: 0.9 10*3/uL (ref 0.1–0.9)
Monocytes: 10 %
Neutrophils Absolute: 2.5 10*3/uL (ref 1.4–7.0)
Neutrophils: 29 %
Platelets: 236 10*3/uL (ref 150–450)
RBC: 4.3 x10E6/uL (ref 4.14–5.80)
RDW: 14.9 % (ref 12.3–15.4)
WBC: 8.9 10*3/uL (ref 3.4–10.8)

## 2017-11-10 LAB — COMPREHENSIVE METABOLIC PANEL
ALT: 114 IU/L — ABNORMAL HIGH (ref 0–44)
AST: 65 IU/L — ABNORMAL HIGH (ref 0–40)
Albumin/Globulin Ratio: 1.6 (ref 1.2–2.2)
Albumin: 4.4 g/dL (ref 3.5–5.5)
Alkaline Phosphatase: 71 IU/L (ref 39–117)
BUN/Creatinine Ratio: 14 (ref 9–20)
BUN: 12 mg/dL (ref 6–24)
Bilirubin Total: 0.5 mg/dL (ref 0.0–1.2)
CO2: 18 mmol/L — ABNORMAL LOW (ref 20–29)
Calcium: 9.2 mg/dL (ref 8.7–10.2)
Chloride: 105 mmol/L (ref 96–106)
Creatinine, Ser: 0.87 mg/dL (ref 0.76–1.27)
GFR calc Af Amer: 116 mL/min/{1.73_m2} (ref >59)
GFR calc non Af Amer: 101 mL/min/{1.73_m2} (ref >59)
Globulin, Total: 2.7 g/dL (ref 1.5–4.5)
Glucose: 126 mg/dL — ABNORMAL HIGH (ref 65–99)
Potassium: 4.1 mmol/L (ref 3.5–5.2)
Sodium: 141 mmol/L (ref 134–144)
Total Protein: 7.1 g/dL (ref 6.0–8.5)

## 2017-11-10 LAB — LIPID PANEL
Chol/HDL Ratio: 6.3 ratio — ABNORMAL HIGH (ref 0.0–5.0)
Cholesterol, Total: 139 mg/dL (ref 100–199)
HDL: 22 mg/dL — ABNORMAL LOW (ref >39)
LDL Calculated: 78 mg/dL (ref 0–99)
Triglycerides: 194 mg/dL — ABNORMAL HIGH (ref 0–149)
VLDL Cholesterol Cal: 39 mg/dL (ref 5–40)

## 2017-11-10 LAB — TSH: TSH: 2.43 u[IU]/mL (ref 0.450–4.500)

## 2017-11-10 LAB — HIV ANTIBODY (ROUTINE TESTING W REFLEX): HIV Screen 4th Generation wRfx: NONREACTIVE

## 2017-11-22 ENCOUNTER — Other Ambulatory Visit: Payer: Self-pay | Admitting: Family Medicine

## 2017-11-22 DIAGNOSIS — I10 Essential (primary) hypertension: Secondary | ICD-10-CM

## 2017-12-02 ENCOUNTER — Other Ambulatory Visit: Payer: Self-pay | Admitting: Family Medicine

## 2017-12-02 DIAGNOSIS — Z9109 Other allergy status, other than to drugs and biological substances: Secondary | ICD-10-CM

## 2017-12-03 ENCOUNTER — Ambulatory Visit (INDEPENDENT_AMBULATORY_CARE_PROVIDER_SITE_OTHER): Payer: PRIVATE HEALTH INSURANCE | Admitting: Family Medicine

## 2017-12-03 ENCOUNTER — Other Ambulatory Visit: Payer: Self-pay

## 2017-12-03 ENCOUNTER — Ambulatory Visit (HOSPITAL_COMMUNITY)
Admission: RE | Admit: 2017-12-03 | Discharge: 2017-12-03 | Disposition: A | Discharge status: Home / Self Care | Payer: PRIVATE HEALTH INSURANCE | Source: Ambulatory Visit | Attending: Family Medicine | Admitting: Family Medicine

## 2017-12-03 ENCOUNTER — Encounter: Payer: Self-pay | Admitting: Family Medicine

## 2017-12-03 VITALS — BP 147/86 | HR 83 | Temp 98.0°F | Resp 16 | Ht 76.0 in | Wt 320.0 lb

## 2017-12-03 DIAGNOSIS — Z01818 Encounter for other preprocedural examination: Secondary | ICD-10-CM

## 2017-12-03 NOTE — Patient Instructions (Signed)

## 2017-12-03 NOTE — Progress Notes (Signed)
[facility]  Pre-Operative Clearance Exam  Provider: Mike GipAndre Markasia Carrol, FNP   Elliot DallyDavid Whitmoyer is a 52 y.o. male  who presents to the office today for a preoperative consultation at the request of surgeon Dr. Gwenlyn Perkingavid Landau who plans on performing Right partial knee replacement with pending date.   Patient with a  has a past medical history of Asthma and Hypertension. .  Planned anesthesia is general.  The patient has the following known anesthesia issues: No hx of past problems. Patient has a bleeding risk of: no recent abnormal bleeding.  Patient does not have objections to receiving blood products if needed.  The following portions of the patient's history were reviewed and updated as appropriate: allergies, current medications, past family history, past medical history, past social history, past surgical history and problem list.  Review of Systems Review of Systems  Constitutional: Negative.   HENT: Negative.   Eyes: Negative.   Respiratory: Negative.   Cardiovascular: Negative.   Gastrointestinal: Negative.   Genitourinary: Negative.   Musculoskeletal: Negative.   Skin: Negative.   Neurological: Negative.   Psychiatric/Behavioral: Negative.       Objective:    Physical Exam Physical Exam  Constitutional: He is oriented to person, place, and time and well-developed, well-nourished, and in no distress. No distress.  HENT:  Head: Normocephalic and atraumatic.  Right Ear: External ear normal.  Left Ear: External ear normal.  Mouth/Throat: Oropharynx is clear and moist. No oropharyngeal exudate.  Eyes: Pupils are equal, round, and reactive to light. Conjunctivae and EOM are normal.  Neck: Normal range of motion. Neck supple.  Cardiovascular: Normal rate, regular rhythm and intact distal pulses. Exam reveals no gallop and no friction rub.  No murmur heard. Pulmonary/Chest: Effort normal and breath sounds normal. No respiratory  distress. He has no wheezes.  Abdominal: Soft. Bowel sounds are normal. There is no tenderness.  Musculoskeletal: Normal range of motion. He exhibits no edema or tenderness.  Lymphadenopathy:    He has no cervical adenopathy.  Neurological: He is alert and oriented to person, place, and time. Gait normal.  Skin: Skin is warm and dry.  Psychiatric: Mood, memory, affect and judgment normal.  Nursing note and vitals reviewed.    Predictors of intubation difficulty: Morbid obesity? yes  Neck range of motion: normal Dentition: No chipped, loose, or missing teeth.  Cardiographics ECG: Stable from 03/2017   Imaging Chest x-ray:pending  Lab Review  Appointment on 11/09/2017  Component Date Value  . TSH 11/09/2017 2.430   . Cholesterol, Total 11/09/2017 139   . Triglycerides 11/09/2017 194*  . HDL 11/09/2017 22*  . VLDL Cholesterol Cal 11/09/2017 39   . LDL Calculated 11/09/2017 78   . Chol/HDL Ratio 11/09/2017 6.3*  . HIV Screen 4th Generatio* 11/09/2017 Non Reactive   . Glucose 11/09/2017 126*  . BUN 11/09/2017 12   . Creatinine, Ser 11/09/2017 0.87   . GFR calc non Af Amer 11/09/2017 101   . GFR calc Af Amer 11/09/2017 116   . BUN/Creatinine Ratio 11/09/2017 14   . Sodium 11/09/2017 141   . Potassium 11/09/2017 4.1   . Chloride 11/09/2017 105   . CO2 11/09/2017 18*  . Calcium 11/09/2017 9.2   . Total Protein 11/09/2017 7.1   . Albumin 11/09/2017 4.4   . Globulin, Total 11/09/2017 2.7   . Albumin/Globulin Ratio 11/09/2017 1.6   . Bilirubin Total 11/09/2017 0.5   . Alkaline Phosphatase 11/09/2017 71   .  AST 11/09/2017 65*  . ALT 11/09/2017 114*  . WBC 11/09/2017 8.9   . RBC 11/09/2017 4.30   . Hemoglobin 11/09/2017 12.4*  . Hematocrit 11/09/2017 35.6*  . MCV 11/09/2017 83   [facility]   . MCHC 11/09/2017 34.8   . RDW 11/09/2017 14.9   . Platelets 11/09/2017 236   . Neutrophils 11/09/2017 29   . Lymphs 11/09/2017 59   . Monocytes 11/09/2017 10   .  Eos 11/09/2017 1   . Basos 11/09/2017 1   . Neutrophils Absolute 11/09/2017 2.5   . Lymphocytes Absolute 11/09/2017 5.3*  . Monocytes Absolute 11/09/2017 0.9   . EOS (ABSOLUTE) 11/09/2017 0.1   . Basophils Absolute 11/09/2017 0.1   . Immature Granulocytes 11/09/2017 0   . Immature Grans (Abs) 11/09/2017 0.0   . Hematology Comments: 11/09/2017 Note:   Office Visit on 11/05/2017  Component Date Value  . Glucose, UA 11/05/2017 Negative   . Bilirubin, UA 11/05/2017 neg   . Ketones, UA 11/05/2017 neg   . Spec Grav, UA 11/05/2017 1.020   . Blood, UA 11/05/2017 neg   . pH, UA 11/05/2017 5.5   . Protein, UA 11/05/2017 Negative   . Urobilinogen, UA 11/05/2017 0.2   . Nitrite, UA 11/05/2017 neg   . Leukocytes, UA 11/05/2017 Negative   . Hemoglobin A1C 11/05/2017 5.7*    Will repeat labs today.    Assessment/Plan: Pre-Op examination 1. Pre-op exam Clearance Pending labs and imaging.  - DG Chest 2 View; Future - EKG 12-Lead - CBC with Differential - Comprehensive metabolic panel      Known risk factors for perioperative complications: Diabetes mellitus   Difficulty with intubation is not anticipated.  Preoperative workup as follows chest x-ray, ECG, hemoglobin, hematocrit, electrolytes, creatinine, liver function studies. Change in medication regimen before surgery: none, continue medication regimen including morning of surgery, with sip of water.

## 2017-12-04 LAB — CBC WITH DIFFERENTIAL/PLATELET
Basophils Absolute: 0 10*3/uL (ref 0.0–0.2)
Basos: 1 %
EOS (ABSOLUTE): 0.1 10*3/uL (ref 0.0–0.4)
Eos: 2 %
Hematocrit: 41.6 % (ref 37.5–51.0)
Hemoglobin: 14.2 g/dL (ref 13.0–17.7)
Immature Grans (Abs): 0 10*3/uL (ref 0.0–0.1)
Immature Granulocytes: 0 %
Lymphocytes Absolute: 2.5 10*3/uL (ref 0.7–3.1)
Lymphs: 51 %
MCH: 29 pg (ref 26.6–33.0)
MCHC: 34.1 g/dL (ref 31.5–35.7)
MCV: 85 fL (ref 79–97)
Monocytes Absolute: 0.5 10*3/uL (ref 0.1–0.9)
Monocytes: 10 %
Neutrophils Absolute: 1.8 10*3/uL (ref 1.4–7.0)
Neutrophils: 36 %
Platelets: 291 10*3/uL (ref 150–450)
RBC: 4.89 x10E6/uL (ref 4.14–5.80)
RDW: 15 % (ref 12.3–15.4)
WBC: 4.9 10*3/uL (ref 3.4–10.8)

## 2017-12-04 LAB — COMPREHENSIVE METABOLIC PANEL
ALT: 63 IU/L — ABNORMAL HIGH (ref 0–44)
AST: 45 IU/L — ABNORMAL HIGH (ref 0–40)
Albumin/Globulin Ratio: 1.6 (ref 1.2–2.2)
Albumin: 4.7 g/dL (ref 3.5–5.5)
Alkaline Phosphatase: 58 IU/L (ref 39–117)
BUN/Creatinine Ratio: 18 (ref 9–20)
BUN: 17 mg/dL (ref 6–24)
Bilirubin Total: 0.5 mg/dL (ref 0.0–1.2)
CO2: 22 mmol/L (ref 20–29)
Calcium: 10.1 mg/dL (ref 8.7–10.2)
Chloride: 104 mmol/L (ref 96–106)
Creatinine, Ser: 0.96 mg/dL (ref 0.76–1.27)
GFR calc Af Amer: 106 mL/min/{1.73_m2} (ref >59)
GFR calc non Af Amer: 92 mL/min/{1.73_m2} (ref >59)
Globulin, Total: 2.9 g/dL (ref 1.5–4.5)
Glucose: 93 mg/dL (ref 65–99)
Potassium: 4.2 mmol/L (ref 3.5–5.2)
Sodium: 139 mmol/L (ref 134–144)
Total Protein: 7.6 g/dL (ref 6.0–8.5)

## 2017-12-19 ENCOUNTER — Other Ambulatory Visit: Payer: Self-pay | Admitting: Family Medicine

## 2017-12-19 DIAGNOSIS — J3089 Other allergic rhinitis: Secondary | ICD-10-CM

## 2017-12-19 DIAGNOSIS — E785 Hyperlipidemia, unspecified: Secondary | ICD-10-CM

## 2018-02-05 ENCOUNTER — Ambulatory Visit (INDEPENDENT_AMBULATORY_CARE_PROVIDER_SITE_OTHER): Payer: PRIVATE HEALTH INSURANCE | Admitting: Family Medicine

## 2018-02-05 ENCOUNTER — Encounter: Payer: Self-pay | Admitting: Family Medicine

## 2018-02-05 ENCOUNTER — Other Ambulatory Visit: Payer: Self-pay

## 2018-02-05 VITALS — BP 150/81 | HR 96 | Temp 98.3°F | Ht 76.0 in | Wt 326.0 lb

## 2018-02-05 DIAGNOSIS — I10 Essential (primary) hypertension: Secondary | ICD-10-CM | POA: Diagnosis not present

## 2018-02-05 DIAGNOSIS — E1169 Type 2 diabetes mellitus with other specified complication: Secondary | ICD-10-CM

## 2018-02-05 DIAGNOSIS — E669 Obesity, unspecified: Secondary | ICD-10-CM | POA: Diagnosis not present

## 2018-02-05 DIAGNOSIS — Z23 Encounter for immunization: Secondary | ICD-10-CM | POA: Diagnosis not present

## 2018-02-05 LAB — POCT GLYCOSYLATED HEMOGLOBIN (HGB A1C): Hemoglobin A1C: 5.8 % — AB (ref 4.0–5.6)

## 2018-02-05 NOTE — Patient Instructions (Signed)

## 2018-02-05 NOTE — Progress Notes (Signed)
[facility]  Provider: Mike Gip, FNP   Hypertension Follow Up Visit  SUBJECTIVE:  Scott Buchanan is a 52 y.o. male who  has a past medical history of Asthma and Hypertension.  New concerns:  Upcoming surgery on the right knee 03/02/2018. Patient doing well otherwise.   Current Outpatient Medications  Medication Sig Dispense Refill  . albuterol (PROVENTIL HFA;VENTOLIN HFA) 108 (90 Base) MCG/ACT inhaler Inhale 2 puffs into the lungs every 6 (six) hours as needed for wheezing or shortness of breath. 1 Inhaler 0  . amLODipine (NORVASC) 10 MG tablet TAKE 1 TABLET BY MOUTH ONCE DAILY 30 tablet 5  . Ascorbic Acid (VITAMIN C) 100 MG tablet Take 100 mg by mouth daily.    Marland Kitchen atorvastatin (LIPITOR) 20 MG tablet TAKE 1 TABLET BY MOUTH ONCE DAILY 90 tablet 3  . cholecalciferol (VITAMIN D) 1000 units tablet Take 1,000 Units by mouth daily.    . fluticasone (FLONASE) 50 MCG/ACT nasal spray Place into both nostrils daily.    . hydrochlorothiazide (HYDRODIURIL) 12.5 MG tablet TAKE 1 TABLET BY MOUTH ONCE DAILY 90 tablet 3  . levocetirizine (XYZAL) 5 MG tablet TAKE 1 TABLET BY MOUTH ONCE DAILY IN THE EVENING 30 tablet 11  . meloxicam (MOBIC) 7.5 MG tablet Take 1 tablet (7.5 mg total) by mouth daily. (Patient taking differently: Take 15 mg by mouth daily. ) 30 tablet 0  . metFORMIN (GLUCOPHAGE) 500 MG tablet Take 1 tablet (500 mg total) by mouth 2 (two) times daily with a meal. 180 tablet 3  . montelukast (SINGULAIR) 10 MG tablet TAKE 1 TABLET BY MOUTH ONCE DAILY AT BEDTIME 30 tablet 3   No current facility-administered medications for this visit.     Recent Results (from the past 2160 hour(s))  TSH     Status: None   Collection Time: 11/09/17  8:37 AM  Result Value Ref Range   TSH 2.430 0.450 - 4.500 uIU/mL  Lipid Panel     Status: Abnormal   Collection Time: 11/09/17  8:37 AM  Result Value Ref Range   Cholesterol, Total 139 100 - 199 mg/dL    Triglycerides 161 (H) 0 - 149 mg/dL   HDL 22 (L) >09 mg/dL   VLDL Cholesterol Cal 39 5 - 40 mg/dL   LDL Calculated 78 0 - 99 mg/dL   Chol/HDL Ratio 6.3 (H) 0.0 - 5.0 ratio    Comment:                                   T. Chol/HDL Ratio                                             Men  Women                               1/2 Avg.Risk  3.4    3.3                                   Avg.Risk  5.0    4.4  2X Avg.Risk  9.6    7.1                                3X Avg.Risk 23.4   11.0   HIV antibody (with reflex)     Status: None   Collection Time: 11/09/17  8:37 AM  Result Value Ref Range   HIV Screen 4th Generation wRfx Non Reactive Non Reactive  Comprehensive metabolic panel     Status: Abnormal   Collection Time: 11/09/17  8:37 AM  Result Value Ref Range   Glucose 126 (H) 65 - 99 mg/dL   BUN 12 6 - 24 mg/dL   Creatinine, Ser 1.61 0.76 - 1.27 mg/dL   GFR calc non Af Amer 101 >59 mL/min/1.73   GFR calc Af Amer 116 >59 mL/min/1.73   BUN/Creatinine Ratio 14 9 - 20   Sodium 141 134 - 144 mmol/L   Potassium 4.1 3.5 - 5.2 mmol/L   Chloride 105 96 - 106 mmol/L   CO2 18 (L) 20 - 29 mmol/L   Calcium 9.2 8.7 - 10.2 mg/dL   Total Protein 7.1 6.0 - 8.5 g/dL   Albumin 4.4 3.5 - 5.5 g/dL   Globulin, Total 2.7 1.5 - 4.5 g/dL   Albumin/Globulin Ratio 1.6 1.2 - 2.2   Bilirubin Total 0.5 0.0 - 1.2 mg/dL   Alkaline Phosphatase 71 39 - 117 IU/L   AST 65 (H) 0 - 40 IU/L   ALT 114 (H) 0 - 44 IU/L  CBC with Differential     Status: Abnormal   Collection Time: 11/09/17  8:37 AM  Result Value Ref Range   WBC 8.9 3.4 - 10.8 x10E3/uL   RBC 4.30 4.14 - 5.80 x10E6/uL   Hemoglobin 12.4 (L) 13.0 - 17.7 g/dL   Hematocrit 09.6 (L) 04.5 - 51.0 %   MCV 83 79 - 97 fL   MCH 28.8 26.6 - 33.0 pg   MCHC 34.8 31.5 - 35.7 g/dL   RDW 40.9 81.1 - 91.4 %   Platelets 236 150 - 450 x10E3/uL   Neutrophils 29 Not Estab. %   Lymphs 59 Not Estab. %    Comment: Few plasmacytoid lymphocytes  and reactive lymphocytes.   Monocytes 10 Not Estab. %   Eos 1 Not Estab. %   Basos 1 Not Estab. %   Neutrophils Absolute 2.5 1.4 - 7.0 x10E3/uL   Lymphocytes Absolute 5.3 (H) 0.7 - 3.1 x10E3/uL   Monocytes Absolute 0.9 0.1 - 0.9 x10E3/uL   EOS (ABSOLUTE) 0.1 0.0 - 0.4 x10E3/uL   Basophils Absolute 0.1 0.0 - 0.2 x10E3/uL   Immature Granulocytes 0 Not Estab. %   Immature Grans (Abs) 0.0 0.0 - 0.1 x10E3/uL   Hematology Comments: Note:     Comment: Verified by microscopic examination.  CBC with Differential     Status: None   Collection Time: 12/03/17 10:08 AM  Result Value Ref Range   WBC 4.9 3.4 - 10.8 x10E3/uL   RBC 4.89 4.14 - 5.80 x10E6/uL   Hemoglobin 14.2 13.0 - 17.7 g/dL   Hematocrit 78.2 95.6 - 51.0 %   MCV 85 79 - 97 fL   MCH 29.0 26.6 - 33.0 pg   MCHC 34.1 31.5 - 35.7 g/dL   RDW 21.3 08.6 - 57.8 %   Platelets 291 150 - 450 x10E3/uL   Neutrophils 36 Not Estab. %   Lymphs 51 Not Estab. %   Monocytes  10 Not Estab. %   Eos 2 Not Estab. %   Basos 1 Not Estab. %   Neutrophils Absolute 1.8 1.4 - 7.0 x10E3/uL   Lymphocytes Absolute 2.5 0.7 - 3.1 x10E3/uL   Monocytes Absolute 0.5 0.1 - 0.9 x10E3/uL   EOS (ABSOLUTE) 0.1 0.0 - 0.4 x10E3/uL   Basophils Absolute 0.0 0.0 - 0.2 x10E3/uL   Immature Granulocytes 0 Not Estab. %   Immature Grans (Abs) 0.0 0.0 - 0.1 x10E3/uL  Comprehensive metabolic panel     Status: Abnormal   Collection Time: 12/03/17 10:08 AM  Result Value Ref Range   Glucose 93 65 - 99 mg/dL   BUN 17 6 - 24 mg/dL   Creatinine, Ser 1.61 0.76 - 1.27 mg/dL   GFR calc non Af Amer 92 >59 mL/min/1.73   GFR calc Af Amer 106 >59 mL/min/1.73   BUN/Creatinine Ratio 18 9 - 20   Sodium 139 134 - 144 mmol/L   Potassium 4.2 3.5 - 5.2 mmol/L   Chloride 104 96 - 106 mmol/L   CO2 22 20 - 29 mmol/L   Calcium 10.1 8.7 - 10.2 mg/dL   Total Protein 7.6 6.0 - 8.5 g/dL   Albumin 4.7 3.5 - 5.5 g/dL   Globulin, Total 2.9 1.5 - 4.5 g/dL   Albumin/Globulin Ratio 1.6 1.2 - 2.2    Bilirubin Total 0.5 0.0 - 1.2 mg/dL   Alkaline Phosphatase 58 39 - 117 IU/L   AST 45 (H) 0 - 40 IU/L   ALT 63 (H) 0 - 44 IU/L    Hypertension ROS: Review of Systems  Constitutional: Negative.   HENT: Negative.   Eyes: Negative.   Respiratory: Negative.   Cardiovascular: Negative.   Gastrointestinal: Negative.   Genitourinary: Negative.   Musculoskeletal: Negative.   Skin: Negative.   Neurological: Negative.   Psychiatric/Behavioral: Negative.      OBJECTIVE:   BP (!) 150/81 (BP Location: Left Arm, Patient Position: Sitting, Cuff Size: Large)   Pulse 96   Temp 98.3 F (36.8 C) (Oral)   Ht 6\' 4"  (1.93 m)   Wt (!) 326 lb (147.9 kg)   SpO2 96%   BMI 39.68 kg/m   Physical Exam  Constitutional: He is oriented to person, place, and time and well-developed, well-nourished, and in no distress. No distress.  HENT:  Head: Normocephalic and atraumatic.  Eyes: Pupils are equal, round, and reactive to light. Conjunctivae and EOM are normal.  Neck: Normal range of motion. Neck supple.  Cardiovascular: Normal rate, regular rhythm and intact distal pulses. Exam reveals no gallop and no friction rub.  No murmur heard. Pulmonary/Chest: Effort normal and breath sounds normal. No respiratory distress. He has no wheezes.  Abdominal: Soft. Bowel sounds are normal. There is no tenderness.  Musculoskeletal: Normal range of motion. He exhibits no edema or tenderness.  Lymphadenopathy:    He has no cervical adenopathy.  Neurological: He is alert and oriented to person, place, and time. Gait normal.  Skin: Skin is warm and dry.  Psychiatric: Mood, memory, affect and judgment normal.  Nursing note and vitals reviewed.    ASSESSMENT/PLAN:  1. Essential hypertension The current medical regimen is effective;  continue present plan and medications. - Microalbumin, urine  2. Diabetes mellitus type 2 in obese [facility]) Patient is at goal. No medication changes.  - HgB A1c  3. Need for  immunization against influenza Influenza vaccination given in the office visit today. - Flu Vaccine QUAD 36+ mos IM   The  patient is asked to make an attempt to improve diet and exercise patterns to aid in medical management of this problem.  Return to care as scheduled and prn. Patient verbalized understanding and agreed with plan of care.    Ms. Freda Jackson. Riley Lam, FNP-BC [facility] [address] [place], [place] [address] [phone]

## 2018-02-06 LAB — MICROALBUMIN, URINE: Microalbumin, Urine: 40.5 ug/mL

## 2018-02-10 ENCOUNTER — Other Ambulatory Visit: Payer: Self-pay | Admitting: Orthopedic Surgery

## 2018-02-22 ENCOUNTER — Other Ambulatory Visit (HOSPITAL_COMMUNITY): Payer: PRIVATE HEALTH INSURANCE

## 2018-02-22 NOTE — Patient Instructions (Addendum)
Scott Buchanan  02/22/2018   Your procedure is scheduled on: 03-02-18   Report to [facility] Main  Entrance              Report to admitting at      0730 AM    Call this number if you have problems the morning of surgery [phone]    Remember: Do not eat food or drink liquids :After Midnight. BRUSH YOUR TEETH MORNING OF SURGERY AND RINSE YOUR MOUTH OUT, NO CHEWING GUM CANDY OR MINTS.     Take these medicines the morning of surgery with A SIP OF WATER: flonase, lipitor, amlodipine, inhaler Bring with you              DO NOT TAKE ANY DIABETIC MEDICATIONS DAY OF YOUR SURGERY                               You may not have any metal on your body including hair pins and              piercings  Do not wear jewelry, , lotions, powders or perfumes, deodorant                       Men may shave face and neck.   Do not bring valuables to the hospital. [facility] IS NOT             RESPONSIBLE   FOR VALUABLES.  Contacts, dentures or bridgework may not be worn into surgery.  Leave suitcase in the car. After surgery it may be brought to your room.               Please read over the following fact sheets you were given: _____________________________________________________________________           [facility] - Preparing for Surgery Before surgery, you can play an important role.  Because skin is not sterile, your skin needs to be as free of germs as possible.  You can reduce the number of germs on your skin by washing with CHG (chlorahexidine gluconate) soap before surgery.  CHG is an antiseptic cleaner which kills germs and bonds with the skin to continue killing germs even after washing. Please DO NOT use if you have an allergy to CHG or antibacterial soaps.  If your skin becomes reddened/irritated stop using the CHG and inform your nurse when you arrive at Short Stay. Do not shave (including legs and underarms) for at least 48 hours prior to the first CHG shower.   You may shave your face/neck. Please follow these instructions carefully:  1.  Shower with CHG Soap the night before surgery and the  morning of Surgery.  2.  If you choose to wash your hair, wash your hair first as usual with your  normal  shampoo.  3.  After you shampoo, rinse your hair and body thoroughly to remove the  shampoo.                           4.  Use CHG as you would any other liquid soap.  You can apply chg directly  to the skin and wash  Gently with a scrungie or clean washcloth.  5.  Apply the CHG Soap to your body ONLY FROM THE NECK DOWN.   Do not use on face/ open                           Wound or open sores. Avoid contact with eyes, ears mouth and genitals (private parts).                       Wash face,  Genitals (private parts) with your normal soap.             6.  Wash thoroughly, paying special attention to the area where your surgery  will be performed.  7.  Thoroughly rinse your body with warm water from the neck down.  8.  DO NOT shower/wash with your normal soap after using and rinsing off  the CHG Soap.                9.  Pat yourself dry with a clean towel.            10.  Wear clean pajamas.            11.  Place clean sheets on your bed the night of your first shower and do not  sleep with pets. Day of Surgery : Do not apply any lotions/deodorants the morning of surgery.  Please wear clean clothes to the hospital/surgery center.  FAILURE TO FOLLOW THESE INSTRUCTIONS MAY RESULT IN THE CANCELLATION OF YOUR SURGERY PATIENT SIGNATURE_________________________________  NURSE SIGNATURE__________________________________  ________________________________________________________________________   Scott Buchanan  An incentive spirometer is a tool that can help keep your lungs clear and active. This tool measures how well you are filling your lungs with each breath. Taking long deep breaths may help reverse or decrease the chance of  developing breathing (pulmonary) problems (especially infection) following:  A long period of time when you are unable to move or be active. BEFORE THE PROCEDURE   If the spirometer includes an indicator to show your best effort, your nurse or respiratory therapist will set it to a desired goal.  If possible, sit up straight or lean slightly forward. Try not to slouch.  Hold the incentive spirometer in an upright position. INSTRUCTIONS FOR USE  1. Sit on the edge of your bed if possible, or sit up as far as you can in bed or on a chair. 2. Hold the incentive spirometer in an upright position. 3. Breathe out normally. 4. Place the mouthpiece in your mouth and seal your lips tightly around it. 5. Breathe in slowly and as deeply as possible, raising the piston or the ball toward the top of the column. 6. Hold your breath for 3-5 seconds or for as long as possible. Allow the piston or ball to fall to the bottom of the column. 7. Remove the mouthpiece from your mouth and breathe out normally. 8. Rest for a few seconds and repeat Steps 1 through 7 at least 10 times every 1-2 hours when you are awake. Take your time and take a few normal breaths between deep breaths. 9. The spirometer may include an indicator to show your best effort. Use the indicator as a goal to work toward during each repetition. 10. After each set of 10 deep breaths, practice coughing to be sure your lungs are clear. If you have an incision (the cut made at the time of surgery),  support your incision when coughing by placing a pillow or rolled up towels firmly against it. Once you are able to get out of bed, walk around indoors and cough well. You may stop using the incentive spirometer when instructed by your caregiver.  RISKS AND COMPLICATIONS  Take your time so you do not get dizzy or light-headed.  If you are in pain, you may need to take or ask for pain medication before doing incentive spirometry. It is harder to take a  deep breath if you are having pain. AFTER USE  Rest and breathe slowly and easily.  It can be helpful to keep track of a log of your progress. Your caregiver can provide you with a simple table to help with this. If you are using the spirometer at home, follow these instructions: [place] IF:   You are having difficultly using the spirometer.  You have trouble using the spirometer as often as instructed.  Your pain medication is not giving enough relief while using the spirometer.  You develop fever of 100.5 F (38.1 C) or higher. SEEK IMMEDIATE MEDICAL CARE IF:   You cough up bloody sputum that had not been present before.  You develop fever of 102 F (38.9 C) or greater.  You develop worsening pain at or near the incision site. MAKE SURE YOU:   Understand these instructions.  Will watch your condition.  Will get help right away if you are not doing well or get worse. Document Released: 08/18/2006 Document Revised: 06/30/2011 Document Reviewed: 10/19/2006 [facility] Patient Information 2014 [place], [place].   ________________________________________________________________________

## 2018-02-23 ENCOUNTER — Encounter (HOSPITAL_COMMUNITY)
Admission: RE | Admit: 2018-02-23 | Discharge: 2018-02-23 | Disposition: A | Discharge status: Home / Self Care | Payer: PRIVATE HEALTH INSURANCE | Source: Ambulatory Visit | Attending: Orthopedic Surgery | Admitting: Orthopedic Surgery

## 2018-02-23 ENCOUNTER — Encounter (HOSPITAL_COMMUNITY): Payer: Self-pay

## 2018-02-23 ENCOUNTER — Other Ambulatory Visit: Payer: Self-pay

## 2018-02-23 DIAGNOSIS — Z01812 Encounter for preprocedural laboratory examination: Secondary | ICD-10-CM | POA: Insufficient documentation

## 2018-02-23 HISTORY — DX: Type 2 diabetes mellitus without complications: E11.9

## 2018-02-23 HISTORY — DX: Unspecified osteoarthritis, unspecified site: M19.90

## 2018-02-23 LAB — CBC
HEMATOCRIT: 41.6 % (ref 39.0–52.0)
Hemoglobin: 13.8 g/dL (ref 13.0–17.0)
MCH: 27.7 pg (ref 26.0–34.0)
MCHC: 33.2 g/dL (ref 30.0–36.0)
MCV: 83.5 fL (ref 80.0–100.0)
NRBC: 0 % (ref 0.0–0.2)
PLATELETS: 320 10*3/uL (ref 150–400)
RBC: 4.98 MIL/uL (ref 4.22–5.81)
RDW: 15 % (ref 11.5–15.5)
WBC: 7.1 10*3/uL (ref 4.0–10.5)

## 2018-02-23 LAB — BASIC METABOLIC PANEL
ANION GAP: 7 (ref 5–15)
BUN: 17 mg/dL (ref 6–20)
CALCIUM: 9.4 mg/dL (ref 8.9–10.3)
CO2: 25 mmol/L (ref 22–32)
Chloride: 107 mmol/L (ref 98–111)
Creatinine, Ser: 0.81 mg/dL (ref 0.61–1.24)
GFR calc Af Amer: >60 mL/min (ref >60)
GFR calc non Af Amer: >60 mL/min (ref >60)
GLUCOSE: 117 mg/dL — AB (ref 70–99)
Potassium: 4.2 mmol/L (ref 3.5–5.1)
Sodium: 139 mmol/L (ref 135–145)

## 2018-02-23 LAB — SURGICAL PCR SCREEN
MRSA, PCR: NEGATIVE
Staphylococcus aureus: NEGATIVE

## 2018-02-23 NOTE — Progress Notes (Signed)
ekg 12-03-17 epic [id number] 02-05-18 epic  cxr 12-03-17 epic

## 2018-03-01 MED ORDER — DEXTROSE 5 % IV SOLN
3.0000 g | INTRAVENOUS | Status: AC
  Administered 2018-03-02: 3 g via INTRAVENOUS
  Filled 2018-03-01: qty 3

## 2018-03-02 ENCOUNTER — Ambulatory Visit (HOSPITAL_COMMUNITY): Payer: PRIVATE HEALTH INSURANCE | Admitting: Registered Nurse

## 2018-03-02 ENCOUNTER — Observation Stay (HOSPITAL_COMMUNITY): Payer: PRIVATE HEALTH INSURANCE

## 2018-03-02 ENCOUNTER — Observation Stay (HOSPITAL_COMMUNITY)
Admission: RE | Admit: 2018-03-02 | Discharge: 2018-03-03 | Disposition: A | Discharge status: Home / Self Care | Payer: PRIVATE HEALTH INSURANCE | Source: Ambulatory Visit | Attending: Orthopedic Surgery | Admitting: Orthopedic Surgery

## 2018-03-02 ENCOUNTER — Other Ambulatory Visit: Payer: Self-pay

## 2018-03-02 ENCOUNTER — Encounter (HOSPITAL_COMMUNITY): Payer: Self-pay | Admitting: *Deleted

## 2018-03-02 ENCOUNTER — Encounter (HOSPITAL_COMMUNITY)
Admission: RE | Disposition: A | Discharge status: Home / Self Care | Payer: Self-pay | Source: Ambulatory Visit | Attending: Orthopedic Surgery

## 2018-03-02 DIAGNOSIS — Z79899 Other long term (current) drug therapy: Secondary | ICD-10-CM | POA: Diagnosis not present

## 2018-03-02 DIAGNOSIS — Z87891 Personal history of nicotine dependence: Secondary | ICD-10-CM | POA: Diagnosis not present

## 2018-03-02 DIAGNOSIS — I10 Essential (primary) hypertension: Secondary | ICD-10-CM | POA: Insufficient documentation

## 2018-03-02 DIAGNOSIS — E119 Type 2 diabetes mellitus without complications: Secondary | ICD-10-CM | POA: Diagnosis not present

## 2018-03-02 DIAGNOSIS — Z7984 Long term (current) use of oral hypoglycemic drugs: Secondary | ICD-10-CM | POA: Insufficient documentation

## 2018-03-02 DIAGNOSIS — M1711 Unilateral primary osteoarthritis, right knee: Secondary | ICD-10-CM | POA: Diagnosis present

## 2018-03-02 DIAGNOSIS — Z96651 Presence of right artificial knee joint: Secondary | ICD-10-CM

## 2018-03-02 DIAGNOSIS — Z6839 Body mass index (BMI) 39.0-39.9, adult: Secondary | ICD-10-CM | POA: Insufficient documentation

## 2018-03-02 HISTORY — DX: Unilateral primary osteoarthritis, right knee: M17.11

## 2018-03-02 HISTORY — PX: PARTIAL KNEE ARTHROPLASTY: SHX2174

## 2018-03-02 LAB — GLUCOSE, CAPILLARY
Glucose-Capillary: 125 mg/dL — ABNORMAL HIGH (ref 70–99)
Glucose-Capillary: 107 mg/dL — ABNORMAL HIGH (ref 70–99)
Glucose-Capillary: 155 mg/dL — ABNORMAL HIGH (ref 70–99)
Glucose-Capillary: 140 mg/dL — ABNORMAL HIGH (ref 70–99)

## 2018-03-02 SURGERY — ARTHROPLASTY, KNEE, UNICOMPARTMENTAL
Anesthesia: Spinal | Site: Knee | Laterality: Right

## 2018-03-02 MED ORDER — CEFAZOLIN SODIUM-DEXTROSE 2-4 GM/100ML-% IV SOLN
2.0000 g | Freq: Four times a day (QID) | INTRAVENOUS | Status: AC
  Administered 2018-03-02 (×2): 2 g via INTRAVENOUS
  Filled 2018-03-02 (×2): qty 100

## 2018-03-02 MED ORDER — DEXAMETHASONE SODIUM PHOSPHATE 10 MG/ML IJ SOLN
INTRAMUSCULAR | Status: AC
  Filled 2018-03-02: qty 1

## 2018-03-02 MED ORDER — MENTHOL 3 MG MT LOZG: 1.0000 | LOZENGE | OROMUCOSAL | Status: DC | PRN

## 2018-03-02 MED ORDER — PROPOFOL 10 MG/ML IV BOLUS
INTRAVENOUS | Status: AC
  Filled 2018-03-02: qty 60

## 2018-03-02 MED ORDER — TRANEXAMIC ACID-NACL 1000-0.7 MG/100ML-% IV SOLN
1000.0000 mg | Freq: Once | INTRAVENOUS | Status: AC
  Administered 2018-03-02: 1000 mg via INTRAVENOUS
  Filled 2018-03-02: qty 100

## 2018-03-02 MED ORDER — MAGNESIUM CITRATE PO SOLN: 1.0000 | Freq: Once | ORAL | Status: DC | PRN

## 2018-03-02 MED ORDER — FLUTICASONE PROPIONATE 50 MCG/ACT NA SUSP
2.0000 | Freq: Every day | NASAL | Status: DC
  Filled 2018-03-02: qty 16

## 2018-03-02 MED ORDER — METHOCARBAMOL 500 MG PO TABS
ORAL_TABLET | ORAL | Status: AC
  Filled 2018-03-02: qty 1

## 2018-03-02 MED ORDER — CHLORHEXIDINE GLUCONATE 4 % EX LIQD: 60.0000 mL | Freq: Once | CUTANEOUS | Status: DC

## 2018-03-02 MED ORDER — PHENYLEPHRINE 40 MCG/ML (10ML) SYRINGE FOR IV PUSH (FOR BLOOD PRESSURE SUPPORT)
PREFILLED_SYRINGE | INTRAVENOUS | Status: AC
  Filled 2018-03-02: qty 10

## 2018-03-02 MED ORDER — ATORVASTATIN CALCIUM 20 MG PO TABS
20.0000 mg | ORAL_TABLET | Freq: Every day | ORAL | Status: DC
  Administered 2018-03-02: 20 mg via ORAL
  Filled 2018-03-02: qty 1

## 2018-03-02 MED ORDER — KETOROLAC TROMETHAMINE 15 MG/ML IJ SOLN
15.0000 mg | Freq: Four times a day (QID) | INTRAMUSCULAR | Status: DC
  Administered 2018-03-02 – 2018-03-03 (×3): 15 mg via INTRAVENOUS
  Filled 2018-03-02 (×3): qty 1

## 2018-03-02 MED ORDER — ONDANSETRON HCL 4 MG PO TABS: 4.0000 mg | ORAL_TABLET | Freq: Three times a day (TID) | ORAL | 0 refills | Status: DC | PRN

## 2018-03-02 MED ORDER — OXYCODONE HCL 5 MG PO TABS
5.0000 mg | ORAL_TABLET | ORAL | Status: DC | PRN
  Administered 2018-03-02: 5 mg via ORAL
  Filled 2018-03-02 (×2): qty 2

## 2018-03-02 MED ORDER — METOCLOPRAMIDE HCL 5 MG/ML IJ SOLN: 5.0000 mg | Freq: Three times a day (TID) | INTRAMUSCULAR | Status: DC | PRN

## 2018-03-02 MED ORDER — BUPIVACAINE HCL (PF) 0.25 % IJ SOLN
INTRAMUSCULAR | Status: AC
  Filled 2018-03-02: qty 30

## 2018-03-02 MED ORDER — OXYCODONE HCL 5 MG/5ML PO SOLN
5.0000 mg | Freq: Once | ORAL | Status: DC | PRN
  Filled 2018-03-02: qty 5

## 2018-03-02 MED ORDER — POLYETHYLENE GLYCOL 3350 17 G PO PACK: 17.0000 g | PACK | Freq: Every day | ORAL | Status: DC | PRN

## 2018-03-02 MED ORDER — ONDANSETRON HCL 4 MG/2ML IJ SOLN
INTRAMUSCULAR | Status: DC | PRN
  Administered 2018-03-02: 4 mg via INTRAVENOUS

## 2018-03-02 MED ORDER — DIPHENHYDRAMINE HCL 12.5 MG/5ML PO ELIX: 12.5000 mg | ORAL_SOLUTION | ORAL | Status: DC | PRN

## 2018-03-02 MED ORDER — ALBUTEROL SULFATE (2.5 MG/3ML) 0.083% IN NEBU: 2.5000 mg | INHALATION_SOLUTION | Freq: Four times a day (QID) | RESPIRATORY_TRACT | Status: DC | PRN

## 2018-03-02 MED ORDER — BUPIVACAINE HCL (PF) 0.25 % IJ SOLN
INTRAMUSCULAR | Status: DC | PRN
  Administered 2018-03-02: 30 mL

## 2018-03-02 MED ORDER — AMLODIPINE BESYLATE 10 MG PO TABS
10.0000 mg | ORAL_TABLET | Freq: Every day | ORAL | Status: DC
  Administered 2018-03-03: 10 mg via ORAL
  Filled 2018-03-02: qty 1

## 2018-03-02 MED ORDER — BUPIVACAINE IN DEXTROSE 0.75-8.25 % IT SOLN
INTRATHECAL | Status: DC | PRN
  Administered 2018-03-02: 1.8 mL via INTRATHECAL

## 2018-03-02 MED ORDER — PHENOL 1.4 % MT LIQD: 1.0000 | OROMUCOSAL | Status: DC | PRN

## 2018-03-02 MED ORDER — ONDANSETRON HCL 4 MG/2ML IJ SOLN: 4.0000 mg | Freq: Four times a day (QID) | INTRAMUSCULAR | Status: DC | PRN

## 2018-03-02 MED ORDER — METHOCARBAMOL 500 MG IVPB - SIMPLE MED
500.0000 mg | Freq: Four times a day (QID) | INTRAVENOUS | Status: DC | PRN
  Filled 2018-03-02: qty 50

## 2018-03-02 MED ORDER — OXYCODONE HCL 5 MG PO TABS: 5.0000 mg | ORAL_TABLET | Freq: Once | ORAL | Status: DC | PRN

## 2018-03-02 MED ORDER — POTASSIUM CHLORIDE IN NACL 20-0.45 MEQ/L-% IV SOLN
INTRAVENOUS | Status: DC
  Administered 2018-03-02: 17:00:00 via INTRAVENOUS
  Filled 2018-03-02 (×3): qty 1000

## 2018-03-02 MED ORDER — MIDAZOLAM HCL 2 MG/2ML IJ SOLN
1.0000 mg | INTRAMUSCULAR | Status: DC
  Administered 2018-03-02: 2 mg via INTRAVENOUS
  Filled 2018-03-02: qty 2

## 2018-03-02 MED ORDER — KETOROLAC TROMETHAMINE 30 MG/ML IJ SOLN
INTRAMUSCULAR | Status: DC | PRN
  Administered 2018-03-02: 30 mg

## 2018-03-02 MED ORDER — LACTATED RINGERS IV SOLN
INTRAVENOUS | Status: DC
  Administered 2018-03-02 (×2): via INTRAVENOUS

## 2018-03-02 MED ORDER — FENTANYL CITRATE (PF) 100 MCG/2ML IJ SOLN: 25.0000 ug | INTRAMUSCULAR | Status: DC | PRN

## 2018-03-02 MED ORDER — OXYCODONE HCL 5 MG PO TABS
10.0000 mg | ORAL_TABLET | ORAL | Status: DC | PRN
  Administered 2018-03-02: 10 mg via ORAL
  Administered 2018-03-02 – 2018-03-03 (×3): 15 mg via ORAL
  Administered 2018-03-03: 10 mg via ORAL
  Filled 2018-03-02 (×3): qty 3

## 2018-03-02 MED ORDER — DEXAMETHASONE SODIUM PHOSPHATE 10 MG/ML IJ SOLN
10.0000 mg | Freq: Once | INTRAMUSCULAR | Status: AC
  Administered 2018-03-03: 10 mg via INTRAVENOUS
  Filled 2018-03-02: qty 1

## 2018-03-02 MED ORDER — METOCLOPRAMIDE HCL 5 MG PO TABS: 5.0000 mg | ORAL_TABLET | Freq: Three times a day (TID) | ORAL | Status: DC | PRN

## 2018-03-02 MED ORDER — PROPOFOL 10 MG/ML IV BOLUS
INTRAVENOUS | Status: AC
  Filled 2018-03-02: qty 20

## 2018-03-02 MED ORDER — HYDROMORPHONE HCL 1 MG/ML IJ SOLN: 0.5000 mg | INTRAMUSCULAR | Status: DC | PRN

## 2018-03-02 MED ORDER — PROPOFOL 10 MG/ML IV BOLUS
INTRAVENOUS | Status: DC | PRN
  Administered 2018-03-02: 20 mg via INTRAVENOUS
  Administered 2018-03-02: 30 mg via INTRAVENOUS

## 2018-03-02 MED ORDER — VITAMIN D 25 MCG (1000 UNIT) PO TABS
1000.0000 [IU] | ORAL_TABLET | Freq: Every day | ORAL | Status: DC
  Administered 2018-03-02 – 2018-03-03 (×2): 1000 [IU] via ORAL

## 2018-03-02 MED ORDER — TRANEXAMIC ACID-NACL 1000-0.7 MG/100ML-% IV SOLN
1000.0000 mg | INTRAVENOUS | Status: AC
  Administered 2018-03-02: 1000 mg via INTRAVENOUS

## 2018-03-02 MED ORDER — LIDOCAINE 2% (20 MG/ML) 5 ML SYRINGE
INTRAMUSCULAR | Status: AC
  Filled 2018-03-02: qty 5

## 2018-03-02 MED ORDER — OXYCODONE HCL 5 MG PO TABS: 5.0000 mg | ORAL_TABLET | ORAL | 0 refills | Status: DC | PRN

## 2018-03-02 MED ORDER — PROPOFOL 500 MG/50ML IV EMUL
INTRAVENOUS | Status: DC | PRN
  Administered 2018-03-02: 50 ug/kg/min via INTRAVENOUS

## 2018-03-02 MED ORDER — ACETAMINOPHEN 325 MG PO TABS: 325.0000 mg | ORAL_TABLET | Freq: Four times a day (QID) | ORAL | Status: DC | PRN

## 2018-03-02 MED ORDER — ONDANSETRON HCL 4 MG/2ML IJ SOLN
INTRAMUSCULAR | Status: AC
  Filled 2018-03-02: qty 2

## 2018-03-02 MED ORDER — HYDROCHLOROTHIAZIDE 25 MG PO TABS
12.5000 mg | ORAL_TABLET | Freq: Every day | ORAL | Status: DC
  Administered 2018-03-02 – 2018-03-03 (×2): 12.5 mg via ORAL
  Filled 2018-03-02 (×2): qty 1

## 2018-03-02 MED ORDER — MONTELUKAST SODIUM 10 MG PO TABS
10.0000 mg | ORAL_TABLET | Freq: Every day | ORAL | Status: DC
  Administered 2018-03-02: 10 mg via ORAL
  Filled 2018-03-02: qty 1

## 2018-03-02 MED ORDER — LIP MEDEX EX OINT
TOPICAL_OINTMENT | CUTANEOUS | Status: AC
  Administered 2018-03-02: 17:00:00
  Filled 2018-03-02: qty 7

## 2018-03-02 MED ORDER — ASPIRIN EC 325 MG PO TBEC
325.0000 mg | DELAYED_RELEASE_TABLET | Freq: Two times a day (BID) | ORAL | Status: DC
  Administered 2018-03-03: 325 mg via ORAL
  Filled 2018-03-02: qty 1

## 2018-03-02 MED ORDER — SENNA-DOCUSATE SODIUM 8.6-50 MG PO TABS: 2.0000 | ORAL_TABLET | Freq: Every day | ORAL | 1 refills | Status: DC

## 2018-03-02 MED ORDER — LORATADINE 10 MG PO TABS
10.0000 mg | ORAL_TABLET | Freq: Every evening | ORAL | Status: DC
  Administered 2018-03-02: 10 mg via ORAL
  Filled 2018-03-02: qty 1

## 2018-03-02 MED ORDER — DEXAMETHASONE SODIUM PHOSPHATE 10 MG/ML IJ SOLN
INTRAMUSCULAR | Status: DC | PRN
  Administered 2018-03-02: 10 mg via INTRAVENOUS

## 2018-03-02 MED ORDER — ROPIVACAINE HCL 7.5 MG/ML IJ SOLN
INTRAMUSCULAR | Status: DC | PRN
  Administered 2018-03-02: 20 mL via PERINEURAL

## 2018-03-02 MED ORDER — INSULIN ASPART 100 UNIT/ML ~~LOC~~ SOLN
0.0000 [IU] | Freq: Three times a day (TID) | SUBCUTANEOUS | Status: DC
  Administered 2018-03-02: 3 [IU] via SUBCUTANEOUS
  Administered 2018-03-03: 2 [IU] via SUBCUTANEOUS

## 2018-03-02 MED ORDER — ZOLPIDEM TARTRATE 5 MG PO TABS: 5.0000 mg | ORAL_TABLET | Freq: Every evening | ORAL | Status: DC | PRN

## 2018-03-02 MED ORDER — ALBUTEROL SULFATE HFA 108 (90 BASE) MCG/ACT IN AERS
2.0000 | INHALATION_SPRAY | Freq: Four times a day (QID) | RESPIRATORY_TRACT | Status: DC | PRN
  Administered 2018-03-02: 2 via RESPIRATORY_TRACT
  Filled 2018-03-02: qty 6.7

## 2018-03-02 MED ORDER — BACLOFEN 10 MG PO TABS: 10.0000 mg | ORAL_TABLET | Freq: Three times a day (TID) | ORAL | 0 refills | Status: DC

## 2018-03-02 MED ORDER — VITAMIN C 250 MG PO TABS
125.0000 mg | ORAL_TABLET | Freq: Every day | ORAL | Status: DC
  Administered 2018-03-02 – 2018-03-03 (×2): 125 mg via ORAL
  Filled 2018-03-02 (×2): qty 1

## 2018-03-02 MED ORDER — ASPIRIN EC 325 MG PO TBEC: 325.0000 mg | DELAYED_RELEASE_TABLET | Freq: Two times a day (BID) | ORAL | Status: DC

## 2018-03-02 MED ORDER — SODIUM CHLORIDE 0.9 % IR SOLN
Status: DC | PRN
  Administered 2018-03-02: 1000 mL

## 2018-03-02 MED ORDER — BISACODYL 10 MG RE SUPP: 10.0000 mg | Freq: Every day | RECTAL | Status: DC | PRN

## 2018-03-02 MED ORDER — ACETAMINOPHEN 500 MG PO TABS
1000.0000 mg | ORAL_TABLET | Freq: Four times a day (QID) | ORAL | Status: AC
  Administered 2018-03-02 – 2018-03-03 (×4): 1000 mg via ORAL
  Filled 2018-03-02 (×4): qty 2

## 2018-03-02 MED ORDER — DOCUSATE SODIUM 100 MG PO CAPS
100.0000 mg | ORAL_CAPSULE | Freq: Two times a day (BID) | ORAL | Status: DC
  Administered 2018-03-02 – 2018-03-03 (×2): 100 mg via ORAL
  Filled 2018-03-02 (×2): qty 1

## 2018-03-02 MED ORDER — ASPIRIN EC 325 MG PO TBEC: 325.0000 mg | DELAYED_RELEASE_TABLET | Freq: Two times a day (BID) | ORAL | 0 refills | Status: DC

## 2018-03-02 MED ORDER — OXYCODONE HCL 5 MG PO TABS
ORAL_TABLET | ORAL | Status: AC
  Filled 2018-03-02: qty 1

## 2018-03-02 MED ORDER — FENTANYL CITRATE (PF) 100 MCG/2ML IJ SOLN
50.0000 ug | INTRAMUSCULAR | Status: DC
  Administered 2018-03-02: 50 ug via INTRAVENOUS
  Filled 2018-03-02: qty 2

## 2018-03-02 MED ORDER — ALUM & MAG HYDROXIDE-SIMETH 200-200-20 MG/5ML PO SUSP: 30.0000 mL | ORAL | Status: DC | PRN

## 2018-03-02 MED ORDER — PHENYLEPHRINE 40 MCG/ML (10ML) SYRINGE FOR IV PUSH (FOR BLOOD PRESSURE SUPPORT)
PREFILLED_SYRINGE | INTRAVENOUS | Status: DC | PRN
  Administered 2018-03-02 (×2): 80 ug via INTRAVENOUS

## 2018-03-02 MED ORDER — TRANEXAMIC ACID-NACL 1000-0.7 MG/100ML-% IV SOLN
INTRAVENOUS | Status: AC
  Filled 2018-03-02: qty 100

## 2018-03-02 MED ORDER — PROMETHAZINE HCL 25 MG/ML IJ SOLN: 6.2500 mg | INTRAMUSCULAR | Status: DC | PRN

## 2018-03-02 MED ORDER — METFORMIN HCL 500 MG PO TABS
500.0000 mg | ORAL_TABLET | Freq: Two times a day (BID) | ORAL | Status: DC
  Administered 2018-03-02 – 2018-03-03 (×2): 500 mg via ORAL
  Filled 2018-03-02 (×2): qty 1

## 2018-03-02 MED ORDER — METHOCARBAMOL 500 MG PO TABS
500.0000 mg | ORAL_TABLET | Freq: Four times a day (QID) | ORAL | Status: DC | PRN
  Administered 2018-03-02 – 2018-03-03 (×3): 500 mg via ORAL
  Filled 2018-03-02 (×2): qty 1

## 2018-03-02 MED ORDER — KETOROLAC TROMETHAMINE 30 MG/ML IJ SOLN
INTRAMUSCULAR | Status: AC
  Filled 2018-03-02: qty 1

## 2018-03-02 MED ORDER — STERILE WATER FOR IRRIGATION IR SOLN
Status: DC | PRN
  Administered 2018-03-02: 1000 mL

## 2018-03-02 MED ORDER — ONDANSETRON HCL 4 MG PO TABS: 4.0000 mg | ORAL_TABLET | Freq: Four times a day (QID) | ORAL | Status: DC | PRN

## 2018-03-02 SURGICAL SUPPLY — 66 items
ARCOM OXFORD UNICOMPART ×3 IMPLANT
BAG ZIPLOCK 12X15 (MISCELLANEOUS) ×3 IMPLANT
BANDAGE ESMARK 6X9 LF (GAUZE/BANDAGES/DRESSINGS) IMPLANT
BLADE SURG 15 STRL LF DISP TIS (BLADE) ×1 IMPLANT
BLADE SURG 15 STRL SS (BLADE) ×2
BNDG ELASTIC 6X10 VLCR STRL LF (GAUZE/BANDAGES/DRESSINGS) ×3 IMPLANT
BNDG ESMARK 6X9 LF (GAUZE/BANDAGES/DRESSINGS)
BOWL SMART MIX CTS (DISPOSABLE) ×3 IMPLANT
CEMENT BONE R 1X40 (Cement) ×3 IMPLANT
CLOSURE STERI-STRIP 1/2X4 (GAUZE/BANDAGES/DRESSINGS) ×1
CLOSURE WOUND 1/2 X4 (GAUZE/BANDAGES/DRESSINGS) ×1
CLSR STERI-STRIP ANTIMIC 1/2X4 (GAUZE/BANDAGES/DRESSINGS) ×2 IMPLANT
COVER SURGICAL LIGHT HANDLE (MISCELLANEOUS) ×3 IMPLANT
COVER WAND RF STERILE (DRAPES) ×3 IMPLANT
CUFF TOURN SGL QUICK 34 (TOURNIQUET CUFF) ×2
CUFF TRNQT CYL 34X4X40X1 (TOURNIQUET CUFF) ×1 IMPLANT
DECANTER SPIKE VIAL GLASS SM (MISCELLANEOUS) ×3 IMPLANT
DRAPE EXTREMITY ABC'S (DRAPES) ×1
DRAPE EXTREMITY ABCS (DRAPES) ×2 IMPLANT
DRAPE ORTHO SPLIT 77X108 STRL (DRAPES)
DRAPE POUCH INSTRU U-SHP 10X18 (DRAPES) ×3 IMPLANT
DRAPE SURG ORHT 6 SPLT 77X108 (DRAPES) IMPLANT
DRAPE U-SHAPE 47X51 STRL (DRAPES) ×3 IMPLANT
DRSG MEPILEX BORDER 4X8 (GAUZE/BANDAGES/DRESSINGS) ×3 IMPLANT
DURAPREP 26ML APPLICATOR (WOUND CARE) ×6 IMPLANT
ELECT REM PT RETURN 15FT ADLT (MISCELLANEOUS) ×3 IMPLANT
FACESHIELD WRAPAROUND (MASK) ×3 IMPLANT
GLOVE BIOGEL PI IND STRL 8 (GLOVE) ×2 IMPLANT
GLOVE BIOGEL PI INDICATOR 8 (GLOVE) ×4
GLOVE ORTHO TXT STRL SZ7.5 (GLOVE) ×3 IMPLANT
GLOVE SURG ORTHO 8.0 STRL STRW (GLOVE) ×3 IMPLANT
GOWN STRL REUS W/TWL 2XL LVL3 (GOWN DISPOSABLE) ×3 IMPLANT
GOWN STRL REUS W/TWL LRG LVL3 (GOWN DISPOSABLE) ×3 IMPLANT
HANDPIECE INTERPULSE COAX TIP (DISPOSABLE) ×2
HOLDER FOLEY CATH W/STRAP (MISCELLANEOUS) ×3 IMPLANT
HOOD PEEL AWAY FLYTE STAYCOOL (MISCELLANEOUS) ×6 IMPLANT
IMMOBILIZER KNEE 20 (SOFTGOODS) ×3
IMMOBILIZER KNEE 20 THIGH 36 (SOFTGOODS) ×1 IMPLANT
IMMOBILIZER KNEE 22 UNIV (SOFTGOODS) IMPLANT
KIT BASIN OR (CUSTOM PROCEDURE TRAY) ×3 IMPLANT
KIT SAW BLADE (KITS) IMPLANT
KNEE OXFORD ARCOM UNICOMPART ×1 IMPLANT
NDL SAFETY ECLIPSE 18X1.5 (NEEDLE) IMPLANT
NEEDLE HYPO 18GX1.5 SHARP (NEEDLE)
NS IRRIG 1000ML POUR BTL (IV SOLUTION) ×3 IMPLANT
PACK BLADE SAW RECIP 70 3 PT (BLADE) ×3 IMPLANT
PACK ICE MAXI GEL EZY WRAP (MISCELLANEOUS) ×3 IMPLANT
PACK TOTAL JOINT (CUSTOM PROCEDURE TRAY) ×3 IMPLANT
PEG FEMORAL CEMENT STRL LRG (Knees) ×3 IMPLANT
POSITIONER SURGICAL ARM (MISCELLANEOUS) ×3 IMPLANT
SET HNDPC FAN SPRY TIP SCT (DISPOSABLE) ×1 IMPLANT
STRIP CLOSURE SKIN 1/2X4 (GAUZE/BANDAGES/DRESSINGS) ×2 IMPLANT
SUCTION FRAZIER HANDLE 12FR (TUBING) ×2
SUCTION TUBE FRAZIER 12FR DISP (TUBING) ×1 IMPLANT
SUT VIC AB 0 CT1 36 (SUTURE) ×6 IMPLANT
SUT VIC AB 2-0 CT1 27 (SUTURE) ×2
SUT VIC AB 2-0 CT1 TAPERPNT 27 (SUTURE) ×1 IMPLANT
SUT VIC AB 3-0 SH 8-18 (SUTURE) ×3 IMPLANT
SYR 20CC LL (SYRINGE) IMPLANT
SYR 3ML LL SCALE MARK (SYRINGE) ×3 IMPLANT
TOWEL OR 17X26 10 PK STRL BLUE (TOWEL DISPOSABLE) IMPLANT
TOWEL OR NON WOVEN STRL DISP B (DISPOSABLE) ×3 IMPLANT
TRAY FOLEY MTR SLVR 16FR STAT (SET/KITS/TRAYS/PACK) ×3 IMPLANT
TRAY TIBIAL SZF RT MEDIAL KNEE (Knees) ×3 IMPLANT
WATER STERILE IRR 1000ML POUR (IV SOLUTION) ×3 IMPLANT
WRAP KNEE MAXI GEL POST OP (GAUZE/BANDAGES/DRESSINGS) ×3 IMPLANT

## 2018-03-02 NOTE — Transfer of Care (Signed)
Immediate Anesthesia Transfer of Care Note  Patient: Scott Buchanan  Procedure(s) Performed: UNICOMPARTMENTAL KNEE (Right Knee)  Patient Location: PACU  Anesthesia Type:Spinal  Level of Consciousness: awake, alert , oriented and patient cooperative  Airway & Oxygen Therapy: Patient Spontanous Breathing and Patient connected to face mask oxygen  Post-op Assessment: Report given to RN and Post -op Vital signs reviewed and stable  Post vital signs: Reviewed and stable  Last Vitals:  Vitals Value Taken Time  BP 130/75 03/02/2018 12:38 PM  Temp    Pulse 83 03/02/2018 12:40 PM  Resp 15 03/02/2018 12:40 PM  SpO2 97 % 03/02/2018 12:40 PM  Vitals shown include unvalidated device data.  Last Pain:  Vitals:   03/02/18 0805  TempSrc: Oral         Complications: No apparent anesthesia complications

## 2018-03-02 NOTE — Anesthesia Procedure Notes (Addendum)
Spinal  Patient location during procedure: OR Start time: 03/02/2018 10:20 AM End time: 03/02/2018 10:28 AM Staffing Anesthesiologist: Beryle LatheBrock, Thomas E, MD Resident/CRNA: Jhonnie GarnerMarshall, Tylor Courtwright M, CRNA Performed: resident/CRNA  Preanesthetic Checklist Completed: patient identified, site marked, surgical consent, pre-op evaluation, timeout performed, IV checked, risks and benefits discussed and monitors and equipment checked Spinal Block Patient position: sitting Prep: DuraPrep Patient monitoring: heart rate, cardiac monitor, continuous pulse ox and blood pressure Approach: midline Location: L3-4 Injection technique: single-shot Needle Needle type: Sprotte  Needle gauge: 24 G Needle length: 15 cm Assessment Sensory level: T4 Additional Notes Clear CSF, no paresthesia, patient tolerated well.

## 2018-03-02 NOTE — Anesthesia Procedure Notes (Signed)
Date/Time: 03/02/2018 10:29 AM Performed by: Jhonnie Garner, CRNA Oxygen Delivery Method: Simple face mask

## 2018-03-02 NOTE — Evaluation (Signed)
Physical Therapy Evaluation Patient Details Name: Scott Buchanan MRN: [id number] DOB: Nov 29, 1965 Today's Date: 03/02/2018   History of Present Illness  52 yo male s/p R UKR on 03/02/18. PMH includes OA, DMII, HTN, R and L knee arthroscopies, sinus surgeries.   Clinical Impression  Pt presents with R knee pain, decreased R knee ROM, difficulty performing mobility tasks, and decreased tolerance for ambulation. Pt to benefit from acute PT to address deficits. Pt ambulated 75 ft with RW with min guard assist. Pt moves quickly, and was encouraged to slow down several times during session for safety. Pt educated on quad sets (5-10/hour), ankle pumps (20/hour), and heel slides (5-10/hour) to perform this afternoon/evening to lessen stiffness and increase circulation, to pt's tolerance and limited by pain. PT to progress mobility as tolerated, and will continue to follow acutely.      Follow Up Recommendations Follow surgeon's recommendation for DC plan and follow-up therapies(OPPT orders in "manage orders" section of epic, pt states no one has spoken with him about OPPT)    Equipment Recommendations  None recommended by PT    Recommendations for Other Services       Precautions / Restrictions Precautions Precautions: Fall Restrictions Weight Bearing Restrictions: No RLE Weight Bearing: Weight bearing as tolerated      Mobility  Bed Mobility Overal bed mobility: Needs Assistance Bed Mobility: Supine to Sit     Supine to sit: Min guard;HOB elevated     General bed mobility comments: Min guard for safety. Increased time to perform.   Transfers Overall transfer level: Needs assistance Equipment used: Rolling walker (2 wheeled) Transfers: Sit to/from Stand Sit to Stand: Min guard;From elevated surface         General transfer comment: Min guard for safety. VC for hand placement. Pt with no RLE buckling with weight bearing or weight shifting.   Ambulation/Gait Ambulation/Gait  assistance: Min guard Gait Distance (Feet): 75 Feet Assistive device: Rolling walker (2 wheeled) Gait Pattern/deviations: Decreased stride length;Step-through pattern Gait velocity: slightly decr    General Gait Details: Min guard for safety. Verbal cuing for placement in RW, turning, sequencing. Pt quickly progressing to step-through gait.   Stairs            Wheelchair Mobility    Modified Rankin (Stroke Patients Only)       Balance Overall balance assessment: Mild deficits observed, not formally tested                                           Pertinent Vitals/Pain Pain Assessment: 0-10 Pain Score: 5  Pain Location: R knee  Pain Descriptors / Indicators: Aching;Burning Pain Intervention(s): Limited activity within patient's tolerance;Repositioned;Ice applied;Monitored during session    Home Living Family/patient expects to be discharged to:: Private residence Living Arrangements: Alone Available Help at Discharge: Family;Friend(s);Available PRN/intermittently Type of Home: Apartment Home Access: Stairs to enter Entrance Stairs-Rails: Right(wall on L side ) Entrance Stairs-Number of Steps: 8 Home Layout: One level Home Equipment: Walker - 2 wheels;Other (comment) Additional Comments: CPM     Prior Function Level of Independence: Independent               Hand Dominance   Dominant Hand: Right    Extremity/Trunk Assessment   Upper Extremity Assessment Upper Extremity Assessment: Overall WFL for tasks assessed    Lower Extremity Assessment Lower Extremity Assessment: Overall WFL for  tasks assessed;RLE deficits/detail RLE Deficits / Details: suspected post-surgical weakness; able to perform ankle pumps, quad set, SLR RLE Sensation: WNL    Cervical / Trunk Assessment Cervical / Trunk Assessment: Normal  Communication   Communication: No difficulties  Cognition Arousal/Alertness: Awake/alert Behavior During Therapy: WFL for  tasks assessed/performed Overall Cognitive Status: Within Functional Limits for tasks assessed                                        General Comments      Exercises Total Joint Exercises Goniometric ROM: knee AAROM approximately 5-90*, limited by pain    Assessment/Plan    PT Assessment Patient needs continued PT services  PT Problem List Decreased strength;Pain;Decreased range of motion;Decreased activity tolerance;Decreased balance;Decreased mobility;Decreased knowledge of use of DME       PT Treatment Interventions DME instruction;Therapeutic activities;Gait training;Therapeutic exercise;Patient/family education;Stair training;Balance training;Functional mobility training    PT Goals (Current goals can be found in the Care Plan section)  Acute Rehab PT Goals Patient Stated Goal: none stated  PT Goal Formulation: With patient Time For Goal Achievement: 03/09/18 Potential to Achieve Goals: Good    Frequency 7X/week   Barriers to discharge        Co-evaluation               AM-PAC PT "6 Clicks" Daily Activity  Outcome Measure Difficulty turning over in bed (including adjusting bedclothes, sheets and blankets)?: Unable Difficulty moving from lying on back to sitting on the side of the bed? : Unable Difficulty sitting down on and standing up from a chair with arms (e.g., wheelchair, bedside commode, etc,.)?: Unable Help needed moving to and from a bed to chair (including a wheelchair)?: None Help needed walking in hospital room?: A Little Help needed climbing 3-5 steps with a railing? : A Little 6 Click Score: 13    End of Session Equipment Utilized During Treatment: Gait belt Activity Tolerance: Patient tolerated treatment well Patient left: in chair;with chair alarm set;with call bell/phone within reach;with SCD's reapplied Nurse Communication: Mobility status PT Visit Diagnosis: Other abnormalities of gait and mobility (R26.89);Difficulty in  walking, not elsewhere classified (R26.2)    Time: [id number] PT Time Calculation (min) (ACUTE ONLY): 25 min   Charges:   PT Evaluation $PT Eval Low Complexity: 1 Low PT Treatments $Gait Training: 8-22 mins        Nicola PoliceAlexa D Mystique Bjelland, PT Acute Rehabilitation Services Pager [phone]  Office [phone]   Loucinda Croy D Despina Hiddenure 03/02/2018, 5:47 PM

## 2018-03-02 NOTE — Discharge Instructions (Signed)

## 2018-03-02 NOTE — Op Note (Signed)
03/02/2018  12:23 PM  PATIENT:  Scott Buchanan    PRE-OPERATIVE DIAGNOSIS: Right knee primary localized osteoarthritis  POST-OPERATIVE DIAGNOSIS:  Same  PROCEDURE:  Unicompartmental Knee Arthroplasty  SURGEON:  Eulas PostJoshua P Suleyma Wafer, MD  PHYSICIAN ASSISTANT: Janace LittenBrandon Parry, OPA-C, present and scrubbed throughout the case, critical for completion in a timely fashion, and for retraction, instrumentation, and closure.  ANESTHESIA:   Spinal  ESTIMATED BLOOD LOSS: 100 mL  UNIQUE ASPECTS OF THE CASE: He was extremely large, and in fact the large femoral component was just barely large enough.  PREOPERATIVE INDICATIONS:  Scott BlitzDavid E Elliott is a  52 y.o. male with a diagnosis of DJD RIGHT KNEE who failed conservative measures and elected for surgical management.    The risks benefits and alternatives were discussed with the patient preoperatively including but not limited to the risks of infection, bleeding, nerve injury, cardiopulmonary complications, blood clots, the need for revision surgery, among others, and the patient was willing to proceed.  OPERATIVE IMPLANTS: [employer] mobile bearing medial compartment arthroplasty femur size large, tibia size F, bearing size 4.  OPERATIVE FINDINGS: Endstage grade 4 medial compartment osteoarthritis.  The ACL was intact.  The lateral compartment had a small central chondral area of grade 2 changes, the patellofemoral joint had some grade 2 changes on the medial facet.  OPERATIVE PROCEDURE: The patient was brought to the operating room placed in the supine position.  Spinal anesthesia was administered. IV antibiotics were given. The lower extremity was placed in the legholder and prepped and draped in usual sterile fashion.  A Foley was also utilized.  Time out was performed.  The leg was elevated and exsanguinated and the tourniquet was inflated. Anteromedial incision was performed, and I took care to preserve the MCL. Parapatellar incision was carried  out, and the osteophytes were excised, along with the medial meniscus and a small portion of the fat pad.  The extra medullary tibial cutting jig was applied, using the spoon and the 4mm G-Clamp and the 2 mm shim, and I took care to protect the anterior cruciate ligament insertion and the tibial spine. The medial collateral ligament was also protected, and I resected my proximal tibia, matching the anatomic slope.   The proximal tibial bony cut was removed in one piece, and I turned my attention to the femur.  The intramedullary femoral rod was placed using the drill, and then using the appropriate reference, I assembled the femoral jig, setting my posterior cutting block. I resected my posterior femur, used the 0 spigot for the anterior femur, and then measured my gap.   I then used the appropriate mill to match the extension gap to the flexion gap. The second milling was at a 3 and then again at 4.  The gaps were then measured again with the appropriate feeler gauges. Once I had balanced flexion and extension gaps, I then completed the preparation of the femur.  I milled off the anterior aspect of the distal femur to prevent impingement. I also exposed the tibia, and selected the above-named component, and then used the cutting jig to prepare the keel slot on the tibia. I also used the awl to curette out the bone to complete the preparation of the keel. The back wall was intact.  I then placed trial components, and it was found to have excellent motion, and appropriate balance.  I then cemented the components into place, cementing the tibia first, removing all excess cement, and then cementing the femur.  All loose cement was removed.  The real polyethylene insert was applied manually, and the knee was taken through functional range of motion, and found to have excellent stability and restoration of joint motion, with excellent balance.  The wounds were irrigated copiously, and the parapatellar  tissue closed with Vicryl, followed by Vicryl for the subcutaneous tissue, with routine closure with Steri-Strips and sterile gauze.  The tourniquet was released, and the patient was awakened and extubated and returned to PACU in stable and satisfactory condition. There were no complications.

## 2018-03-02 NOTE — Anesthesia Procedure Notes (Signed)
Anesthesia Regional Block: Adductor canal block   Pre-Anesthetic Checklist: ,, timeout performed, Correct Patient, Correct Site, Correct Laterality, Correct Procedure, Correct Position, site marked, Risks and benefits discussed,  Surgical consent,  Pre-op evaluation,  At surgeon's request and post-op pain management  Laterality: Right  Prep: chloraprep       Needles:  Injection technique: Single-shot  Needle Type: Echogenic Needle     Needle Length: 10cm  Needle Gauge: 21     Additional Needles:   Narrative:  Start time: 03/02/2018 9:19 AM End time: 03/02/2018 9:23 AM Injection made incrementally with aspirations every 5 mL.  Performed by: Personally  Anesthesiologist: Beryle LatheBrock, Thomas E, MD  Additional Notes: No pain on injection. No increased resistance to injection. Injection made in 5cc increments. Good needle visualization. Patient tolerated the procedure well.

## 2018-03-02 NOTE — Anesthesia Postprocedure Evaluation (Signed)
Anesthesia Post Note  Patient: Scott Buchanan  Procedure(s) Performed: UNICOMPARTMENTAL KNEE (Right Knee)     Patient location during evaluation: PACU Anesthesia Type: Spinal Level of consciousness: awake and alert Pain management: pain level controlled Vital Signs Assessment: post-procedure vital signs reviewed and stable Respiratory status: spontaneous breathing and respiratory function stable Cardiovascular status: blood pressure returned to baseline and stable Postop Assessment: spinal receding and no apparent nausea or vomiting Anesthetic complications: no    Last Vitals:  Vitals:   03/02/18 1315 03/02/18 1400  BP: 135/83 (!) 141/84  Pulse: 80 82  Resp: 18 17  Temp: 36.7 C 36.7 C  SpO2: 100% 98%    Last Pain:  Vitals:   03/02/18 1400  TempSrc:   PainSc: 0-No pain    LLE Motor Response: Purposeful movement;Responds to commands (03/02/18 1400) LLE Sensation: Numbness (03/02/18 1400) RLE Motor Response: Purposeful movement;Responds to commands (03/02/18 1400) RLE Sensation: Numbness (03/02/18 1400) L Sensory Level: S1-Sole of foot, small toes (03/02/18 1400) R Sensory Level: S1-Sole of foot, small toes (03/02/18 1400)  Scott Buchanan

## 2018-03-02 NOTE — Anesthesia Preprocedure Evaluation (Addendum)
Anesthesia Evaluation  Patient identified by MRN, date of birth, ID band Patient awake    Reviewed: Allergy & Precautions, NPO status , Patient's Chart, lab work & pertinent test results  History of Anesthesia Complications Negative for: history of anesthetic complications  Airway Mallampati: II  TM Distance: >3 FB Neck ROM: Full    Dental  (+) Dental Advisory Given, Chipped   Pulmonary asthma , former smoker,    breath sounds clear to auscultation       Cardiovascular hypertension, Pt. on medications  Rhythm:Regular Rate:Normal     Neuro/Psych negative neurological ROS  negative psych ROS   GI/Hepatic negative GI ROS, Neg liver ROS,   Endo/Other  diabetes, Type 2, Oral Hypoglycemic AgentsMorbid obesity  Renal/GU negative Renal ROS     Musculoskeletal  (+) Arthritis ,   Abdominal (+) + obese,   Peds  Hematology negative hematology ROS (+)   Anesthesia Other Findings   Reproductive/Obstetrics                            Anesthesia Physical Anesthesia Plan  ASA: III  Anesthesia Plan: Spinal   Post-op Pain Management:  Regional for Post-op pain   Induction:   PONV Risk Score and Plan: 1 and Treatment may vary due to age or medical condition and Propofol infusion  Airway Management Planned: Natural Airway and Simple Face Mask  Additional Equipment: None  Intra-op Plan:   Post-operative Plan:   Informed Consent: I have reviewed the patients History and Physical, chart, labs and discussed the procedure including the risks, benefits and alternatives for the proposed anesthesia with the patient or authorized representative who has indicated his/her understanding and acceptance.     Plan Discussed with: CRNA and Anesthesiologist  Anesthesia Plan Comments: (Labs reviewed, platelets acceptable. Discussed risks and benefits of spinal, including spinal/epidural hematoma, infection,  failed block, and PDPH. Patient expressed understanding and wished to proceed. )       Anesthesia Quick Evaluation

## 2018-03-02 NOTE — Progress Notes (Signed)
Assisted Dr. Brock with right, ultrasound guided, adductor canal block. Side rails up, monitors on throughout procedure. See vital signs in flow sheet. Tolerated Procedure well.  

## 2018-03-02 NOTE — H&P (Signed)
PREOPERATIVE H&P  Chief Complaint: Right knee pain  HPI: Scott Buchanan is a 52 y.o. male who presents for preoperative history and physical with a diagnosis of right knee primary localized osteoarthritis. Symptoms are rated as moderate to severe, and have been worsening.  This is significantly impairing activities of daily living.  He has elected for surgical management.  Pain is located medially and posteriorly rated 7/10.  He has failed injections, activity modification, anti-inflammatories, and assistive devices, along with Visco supplementation and arthroscopy.  Preoperative X-rays demonstrate end stage degenerative changes with osteophyte formation, loss of joint space, subchondral sclerosis.   Past Medical History:  Diagnosis Date  . Arthritis   . Asthma   . Diabetes mellitus without complication (HCC)    type 2  . Hypertension    Past Surgical History:  Procedure Laterality Date  . CYSTECTOMY     tailbone  . ETHMOIDECTOMY Bilateral 04/07/2017   Procedure: BILATERAL ETHMOIDECTOMY AND SPHENOIDECTOMY;  Surgeon: Newman Pies, MD;  Location: [facility];  Service: ENT;  Laterality: Bilateral;  . KNEE ARTHROSCOPY W/ MENISCAL REPAIR     right and left knee one year apart  . SINUS ENDO WITH FUSION Bilateral 04/07/2017   Procedure: BILATERAL FRONTAL RECESS SINUS EXPLORATION WITH SINUS FUSION NAVIGATION;  Surgeon: Newman Pies, MD;  Location: [facility];  Service: ENT;  Laterality: Bilateral;  . SINUS EXPLORATION    . TONSILLECTOMY    . TURBINATE REDUCTION Bilateral 04/07/2017   Procedure: BILATERAL TURBINATE REDUCTION;  Surgeon: Newman Pies, MD;  Location: [facility];  Service: ENT;  Laterality: Bilateral;  . unicompartmental right knee     Social History   Socioeconomic History  . Marital status: Single    Spouse name: Not on file  . Number of children: Not on file  . Years of education: Not on file  . Highest education level: Not on file   Occupational History  . Not on file  Social Needs  . Financial resource strain: Not on file  . Food insecurity:    Worry: Not on file    Inability: Not on file  . Transportation needs:    Medical: Not on file    Non-medical: Not on file  Tobacco Use  . Smoking status: Former Smoker    Types: Cigarettes  . Smokeless tobacco: Never Used  . Tobacco comment: quit 10 years ago  Substance and Sexual Activity  . Alcohol use: No  . Drug use: No  . Sexual activity: Yes  Lifestyle  . Physical activity:    Days per week: Not on file    Minutes per session: Not on file  . Stress: Not on file  Relationships  . Social connections:    Talks on phone: Not on file    Gets together: Not on file    Attends religious service: Not on file    Active member of club or organization: Not on file    Attends meetings of clubs or organizations: Not on file    Relationship status: Not on file  Other Topics Concern  . Not on file  Social History Narrative  . Not on file   Family History  Problem Relation Age of Onset  . Hypertension Mother    No Known Allergies Prior to Admission medications   Medication Sig Start Date End Date Taking? Authorizing Provider  albuterol (PROVENTIL HFA;VENTOLIN HFA) 108 (90 Base) MCG/ACT inhaler Inhale 2 puffs into the lungs every 6 (six) hours  as needed for wheezing or shortness of breath. 11/21/16  Yes Massie Maroon, FNP  amLODipine (NORVASC) 10 MG tablet TAKE 1 TABLET BY MOUTH ONCE DAILY Patient taking differently: Take 10 mg by mouth daily.  11/23/17  Yes Mike Gip, FNP  Ascorbic Acid (VITAMIN C) 100 MG tablet Take 100 mg by mouth daily.   Yes [provider]  atorvastatin (LIPITOR) 20 MG tablet TAKE 1 TABLET BY MOUTH ONCE DAILY Patient taking differently: Take 20 mg by mouth daily.  12/22/17  Yes Mike Gip, FNP  cholecalciferol (VITAMIN D) 1000 units tablet Take 1,000 Units by mouth daily.   Yes [provider]  fluticasone (FLONASE)  50 MCG/ACT nasal spray Place 2 sprays into both nostrils daily.    Yes [provider]  hydrochlorothiazide (HYDRODIURIL) 12.5 MG tablet TAKE 1 TABLET BY MOUTH ONCE DAILY Patient taking differently: Take 12.5 mg by mouth daily.  10/19/17  Yes Massie Maroon, FNP  levocetirizine (XYZAL) 5 MG tablet TAKE 1 TABLET BY MOUTH ONCE DAILY IN THE EVENING Patient taking differently: Take 5 mg by mouth every evening.  12/03/17  Yes Mike Gip, FNP  metFORMIN (GLUCOPHAGE) 500 MG tablet Take 1 tablet (500 mg total) by mouth 2 (two) times daily with a meal. 04/27/17  Yes Massie Maroon, FNP  montelukast (SINGULAIR) 10 MG tablet TAKE 1 TABLET BY MOUTH ONCE DAILY AT BEDTIME Patient taking differently: Take 10 mg by mouth at bedtime.  12/22/17  Yes Mike Gip, FNP     Positive ROS: All other systems have been reviewed and were otherwise negative with the exception of those mentioned in the HPI and as above.  Physical Exam: General: Alert, no acute distress Cardiovascular: No pedal edema Respiratory: No cyanosis, no use of accessory musculature GI: No organomegaly, abdomen is soft and non-tender Skin: No lesions in the area of chief complaint Neurologic: Sensation intact distally Psychiatric: Patient is competent for consent with normal mood and affect Lymphatic: No axillary or cervical lymphadenopathy  MUSCULOSKELETAL: Right knee has 0 to 120 degrees with positive effusion, pseudolaxity, with crepitance medially and pain with palpation along the medial joint line.  Assessment: Right knee anteromedial arthritis, BMI of 39   Plan: Plan for Procedure(s): UNICOMPARTMENTAL KNEE  The risks benefits and alternatives were discussed with the patient including but not limited to the risks of nonoperative treatment, versus surgical intervention including infection, bleeding, nerve injury,  blood clots, cardiopulmonary complications, morbidity, mortality, among others, and they were willing to  proceed.    Patient's anticipated LOS is less than 2 midnights, meeting these requirements: - Younger than 69 - Lives within 1 hour of care - Has a competent adult at home to recover with post-op recover - NO history of  - Chronic pain requiring opiods  - Diabetes  - Coronary Artery Disease  - Heart failure  - Heart attack  - Stroke  - DVT/VTE  - Cardiac arrhythmia  - Respiratory Failure/COPD  - Renal failure  - Anemia  - Advanced Liver disease        Eulas Post, MD Cell [phone]   03/02/2018 9:42 AM

## 2018-03-03 DIAGNOSIS — M1711 Unilateral primary osteoarthritis, right knee: Secondary | ICD-10-CM | POA: Diagnosis not present

## 2018-03-03 LAB — CBC
HCT: 40 % (ref 39.0–52.0)
Hemoglobin: 13 g/dL (ref 13.0–17.0)
MCH: 27.4 pg (ref 26.0–34.0)
MCHC: 32.5 g/dL (ref 30.0–36.0)
MCV: 84.2 fL (ref 80.0–100.0)
NRBC: 0 % (ref 0.0–0.2)
PLATELETS: 353 10*3/uL (ref 150–400)
RBC: 4.75 MIL/uL (ref 4.22–5.81)
RDW: 15 % (ref 11.5–15.5)
WBC: 14.4 10*3/uL — AB (ref 4.0–10.5)

## 2018-03-03 LAB — BASIC METABOLIC PANEL
ANION GAP: 10 (ref 5–15)
BUN: 21 mg/dL — ABNORMAL HIGH (ref 6–20)
CO2: 22 mmol/L (ref 22–32)
Calcium: 9.2 mg/dL (ref 8.9–10.3)
Chloride: 105 mmol/L (ref 98–111)
Creatinine, Ser: 0.96 mg/dL (ref 0.61–1.24)
GLUCOSE: 145 mg/dL — AB (ref 70–99)
POTASSIUM: 4 mmol/L (ref 3.5–5.1)
SODIUM: 137 mmol/L (ref 135–145)

## 2018-03-03 LAB — GLUCOSE, CAPILLARY: GLUCOSE-CAPILLARY: 121 mg/dL — AB (ref 70–99)

## 2018-03-03 NOTE — Discharge Summary (Signed)
Physician Discharge Summary  Patient ID: Scott Buchanan MRN: [id number] DOB/AGE: [birth year] 50 y.o.  Admit date: 03/02/2018 Discharge date: 03/03/2018  Admission Diagnoses:  Primary localized osteoarthritis of right knee  Discharge Diagnoses:  Principal Problem:   Primary localized osteoarthritis of right knee Active Problems:   Morbid obesity (HCC)   Status post right partial knee replacement   Past Medical History:  Diagnosis Date  . Arthritis   . Asthma   . Diabetes mellitus without complication (HCC)    type 2  . Hypertension   . Primary localized osteoarthritis of right knee 03/02/2018    Surgeries: Procedure(s): UNICOMPARTMENTAL KNEE on 03/02/2018   Consultants (if any):   Discharged Condition: Improved  Hospital Course: Scott Buchanan is an 52 y.o. male who was admitted 03/02/2018 with a diagnosis of Primary localized osteoarthritis of right knee and went to the operating room on 03/02/2018 and underwent the above named procedures.    He was given perioperative antibiotics:  Anti-infectives (From admission, onward)   Start     Dose/Rate Route Frequency Ordered Stop   03/02/18 1630  ceFAZolin (ANCEF) IVPB 2g/100 mL premix     2 g 200 mL/hr over 30 Minutes Intravenous Every 6 hours 03/02/18 1326 03/02/18 2202   03/02/18 0600  ceFAZolin (ANCEF) 3 g in dextrose 5 % 50 mL IVPB     3 g 100 mL/hr over 30 Minutes Intravenous On call to O.R. 03/01/18 7829 03/02/18 1030    .  He was given sequential compression devices, early ambulation, and aspirin for DVT prophylaxis.  He benefited maximally from the hospital stay and there were no complications.    Recent vital signs:  Vitals:   03/03/18 0137 03/03/18 0517  BP: 132/88 (!) 161/100  Pulse: 86 (!) 54  Resp: 18 18  Temp: 97.7 F (36.5 C) 97.7 F (36.5 C)  SpO2: 98% 98%    Recent laboratory studies:  Lab Results  Component Value Date   HGB 13.0 03/03/2018   HGB 13.8 02/23/2018   HGB 14.2 12/03/2017    Lab Results  Component Value Date   WBC 14.4 (H) 03/03/2018   PLT 353 03/03/2018   No results found for: INR Lab Results  Component Value Date   NA 137 03/03/2018   K 4.0 03/03/2018   CL 105 03/03/2018   CO2 22 03/03/2018   BUN 21 (H) 03/03/2018   CREATININE 0.96 03/03/2018   GLUCOSE 145 (H) 03/03/2018    Discharge Medications:   Allergies as of 03/03/2018   No Known Allergies     Medication List    TAKE these medications   albuterol 108 (90 Base) MCG/ACT inhaler Commonly known as:  PROVENTIL HFA;VENTOLIN HFA Inhale 2 puffs into the lungs every 6 (six) hours as needed for wheezing or shortness of breath.   amLODipine 10 MG tablet Commonly known as:  NORVASC TAKE 1 TABLET BY MOUTH ONCE DAILY   aspirin EC 325 MG tablet Take 1 tablet (325 mg total) by mouth 2 (two) times daily.   atorvastatin 20 MG tablet Commonly known as:  LIPITOR TAKE 1 TABLET BY MOUTH ONCE DAILY   baclofen 10 MG tablet Commonly known as:  LIORESAL Take 1 tablet (10 mg total) by mouth 3 (three) times daily. As needed for muscle spasm   cholecalciferol 1000 units tablet Commonly known as:  VITAMIN D Take 1,000 Units by mouth daily.   FLONASE 50 MCG/ACT nasal spray Generic drug:  fluticasone Place 2 sprays into both  nostrils daily.   hydrochlorothiazide 12.5 MG tablet Commonly known as:  HYDRODIURIL TAKE 1 TABLET BY MOUTH ONCE DAILY   levocetirizine 5 MG tablet Commonly known as:  XYZAL TAKE 1 TABLET BY MOUTH ONCE DAILY IN THE EVENING   metFORMIN 500 MG tablet Commonly known as:  GLUCOPHAGE Take 1 tablet (500 mg total) by mouth 2 (two) times daily with a meal.   montelukast 10 MG tablet Commonly known as:  SINGULAIR TAKE 1 TABLET BY MOUTH ONCE DAILY AT BEDTIME   ondansetron 4 MG tablet Commonly known as:  ZOFRAN Take 1 tablet (4 mg total) by mouth every 8 (eight) hours as needed for nausea or vomiting.   oxyCODONE 5 MG immediate release tablet Commonly known as:  Oxy  IR/ROXICODONE Take 1 tablet (5 mg total) by mouth every 4 (four) hours as needed for severe pain.   sennosides-docusate sodium 8.6-50 MG tablet Commonly known as:  SENOKOT-S Take 2 tablets by mouth daily.   vitamin C 100 MG tablet Take 100 mg by mouth daily.       Diagnostic Studies: Dg Knee Right Port  Result Date: 03/02/2018 CLINICAL DATA:  Status post partial knee replacement EXAM: PORTABLE RIGHT KNEE - 1-2 VIEW COMPARISON:  None. FINDINGS: Medial partial knee replacement is noted. Mild osteophytic changes are noted laterally. No acute bony abnormality is seen. Air is noted in the surgical bed. IMPRESSION: Status post medial partial knee replacement without acute abnormality. Electronically Signed   By: Alcide CleverMark  Lukens M.D.   On: 03/02/2018 14:34    Disposition:     Follow-up Information    Teryl LucyLandau, Kiarra Kidd, MD. Schedule an appointment as soon as possible for a visit in 2 weeks.   Specialty:  Orthopedic Surgery Contact information: [address] [place] [address] [phone]            Signed: Eulas PostJoshua P Chessica Audia 03/03/2018, 7:30 AM

## 2018-03-03 NOTE — Progress Notes (Signed)
Physical Therapy Treatment Patient Details Name: Scott Buchanan E Perra MRN: [id number] DOB: [birth year] Today's Date: 03/03/2018    History of Present Illness 52 yo male s/p R UKR on 03/02/18. PMH includes OA, DMII, HTN, R and L knee arthroscopies, sinus surgeries.     PT Comments    POD # 1 Assisted OOB to amb a great distance in hallway, practice stairs and perform all supine TE's following HEP handout. Performed some TE's following HEP handout.  Instructed on proper tech, freq asInstructed on proper tech, frq as well as use of ICE Pt ready for D/C to home well as use of ICE.    Follow Up Recommendations  Follow surgeon's recommendation for DC plan and follow-up therapies     Equipment Recommendations  None recommended by PT    Recommendations for Other Services       Precautions / Restrictions Precautions Precautions: Fall Restrictions Weight Bearing Restrictions: No RLE Weight Bearing: Weight bearing as tolerated    Mobility  Bed Mobility Overal bed mobility: Modified Independent             General bed mobility comments: able tp self perform with increased time  Transfers Overall transfer level: Needs assistance Equipment used: Rolling walker (2 wheeled) Transfers: Sit to/from Stand Sit to Stand: Modified independent (Device/Increase time)         General transfer comment: increased time and ine VC saferty with turns using walker   Ambulation/Gait Ambulation/Gait assistance: Supervision Gait Distance (Feet): 225 Feet Assistive device: Rolling walker (2 wheeled) Gait Pattern/deviations: Decreased stride length;Step-through pattern Gait velocity: slightly decr    General Gait Details: increased time and one VC safety with turns using walker    Stairs Stairs: Yes Stairs assistance: Supervision;Min guard Stair Management: One rail Right;Sideways;Forwards Number of Stairs: 3 General stair comments: 25% VC's on proper sequencing and safety.  performed  twice    Wheelchair Mobility    Modified Rankin (Stroke Patients Only)       Balance                                            Cognition Arousal/Alertness: Awake/alert Behavior During Therapy: WFL for tasks assessed/performed Overall Cognitive Status: Within Functional Limits for tasks assessed                                        Exercises   Total Knee Replacement TE's 10 reps B LE ankle pumps 10 reps towel squeezes 10 reps knee presses 10 reps heel slides  10 reps SAQ's 10 reps SLR's 10 reps ABD Followed by ICE    General Comments        Pertinent Vitals/Pain Pain Assessment: 0-10 Pain Score: 4  Pain Location: R knee  Pain Descriptors / Indicators: Aching;Burning;Operative site guarding Pain Intervention(s): Monitored during session;Premedicated before session;Repositioned;Ice applied    Home Living                      Prior Function            PT Goals (current goals can now be found in the care plan section) Progress towards PT goals: Progressing toward goals    Frequency    7X/week      PT Plan Current plan remains appropriate  Co-evaluation              AM-PAC PT "6 Clicks" Daily Activity  Outcome Measure  Difficulty turning over in bed (including adjusting bedclothes, sheets and blankets)?: A Little Difficulty moving from lying on back to sitting on the side of the bed? : A Little Difficulty sitting down on and standing up from a chair with arms (e.g., wheelchair, bedside commode, etc,.)?: A Little Help needed moving to and from a bed to chair (including a wheelchair)?: A Little Help needed walking in hospital room?: A Little Help needed climbing 3-5 steps with a railing? : A Little 6 Click Score: 18    End of Session Equipment Utilized During Treatment: Gait belt Activity Tolerance: Patient tolerated treatment well Patient left: in chair;with chair alarm set;with call  bell/phone within reach;with SCD's reapplied Nurse Communication: (pt ready for D/C to home ) PT Visit Diagnosis: Other abnormalities of gait and mobility (R26.89);Difficulty in walking, not elsewhere classified (R26.2)     Time: 4098-1191 PT Time Calculation (min) (ACUTE ONLY): 40 min  Charges:  $Gait Training: 8-22 mins $Therapeutic Exercise: 8-22 mins $Therapeutic Activity: 8-22 mins                     Felecia Shelling  PTA Acute  Rehabilitation Services Pager      [phone] Office      [phone]

## 2018-03-05 ENCOUNTER — Encounter (HOSPITAL_COMMUNITY): Payer: Self-pay | Admitting: Orthopedic Surgery

## 2018-05-05 ENCOUNTER — Ambulatory Visit: Payer: PRIVATE HEALTH INSURANCE | Admitting: Family Medicine

## 2018-05-05 ENCOUNTER — Other Ambulatory Visit: Payer: Self-pay | Admitting: Family Medicine

## 2018-05-05 DIAGNOSIS — J3089 Other allergic rhinitis: Secondary | ICD-10-CM

## 2018-05-10 ENCOUNTER — Ambulatory Visit: Payer: PRIVATE HEALTH INSURANCE | Admitting: Family Medicine

## 2018-05-14 ENCOUNTER — Encounter: Payer: Self-pay | Admitting: Family Medicine

## 2018-05-14 ENCOUNTER — Ambulatory Visit (INDEPENDENT_AMBULATORY_CARE_PROVIDER_SITE_OTHER): Payer: PRIVATE HEALTH INSURANCE | Admitting: Family Medicine

## 2018-05-14 VITALS — BP 152/98 | HR 106 | Temp 98.5°F | Resp 18 | Ht 76.0 in | Wt 350.0 lb

## 2018-05-14 DIAGNOSIS — E669 Obesity, unspecified: Secondary | ICD-10-CM

## 2018-05-14 DIAGNOSIS — I10 Essential (primary) hypertension: Secondary | ICD-10-CM | POA: Diagnosis not present

## 2018-05-14 DIAGNOSIS — E785 Hyperlipidemia, unspecified: Secondary | ICD-10-CM | POA: Insufficient documentation

## 2018-05-14 DIAGNOSIS — E1169 Type 2 diabetes mellitus with other specified complication: Secondary | ICD-10-CM | POA: Insufficient documentation

## 2018-05-14 DIAGNOSIS — R8290 Unspecified abnormal findings in urine: Secondary | ICD-10-CM | POA: Diagnosis not present

## 2018-05-14 DIAGNOSIS — J321 Chronic frontal sinusitis: Secondary | ICD-10-CM

## 2018-05-14 HISTORY — DX: Type 2 diabetes mellitus with other specified complication: E11.69

## 2018-05-14 LAB — POCT URINALYSIS DIPSTICK
Bilirubin, UA: NEGATIVE
Blood, UA: NEGATIVE
Glucose, UA: NEGATIVE
Ketones, UA: NEGATIVE
Nitrite, UA: POSITIVE
Protein, UA: POSITIVE — AB
Spec Grav, UA: 1.025 (ref 1.010–1.025)
Urobilinogen, UA: 0.2 E.U./dL
pH, UA: 5.5 (ref 5.0–8.0)

## 2018-05-14 MED ORDER — METHYLPREDNISOLONE 4 MG PO TBPK: ORAL_TABLET | ORAL | 0 refills | Status: DC

## 2018-05-14 NOTE — Progress Notes (Signed)
[facility]   Progress Note: General Provider: Mike GipAndre Jessey Huyett, FNP  SUBJECTIVE:   Scott Buchanan is a 53 y.o. male who  has a past medical history of Arthritis, Asthma, Diabetes mellitus without complication (HCC), Hypertension, and Primary localized osteoarthritis of right knee (03/02/2018).. Patient presents today for Hypertension and Sinus Problem (itchy eyes )  Patient presents for follow up on htn. He reports taking all medications as previously prescribed. Patient states that he has nasal congestion x 1 week. Hx of sinus surgery and followed by ENT. He has an appt with ENT in a few days. Patient reports using nasal lavage. Has not been able to use his Flonase due to congestion.  Denies sinus pain.   Review of Systems  Constitutional: Negative.   HENT: Positive for congestion. Negative for sinus pain.   Eyes: Negative.   Respiratory: Negative.   Cardiovascular: Negative.   Gastrointestinal: Negative.   Genitourinary: Negative.   Musculoskeletal: Negative.   Skin: Negative.   Neurological: Negative.   Psychiatric/Behavioral: Negative.      OBJECTIVE: BP (!) 152/98 Comment: manually  Pulse (!) 106   Temp 98.5 F (36.9 C) (Oral)   Resp 18   Ht 6\' 4"  (1.93 m)   Wt (!) 350 lb (158.8 kg)   SpO2 97%   BMI 42.60 kg/m   Wt Readings from Last 3 Encounters:  05/14/18 (!) 350 lb (158.8 kg)  03/02/18 (!) 328 lb (148.8 kg)  02/23/18 (!) 328 lb (148.8 kg)     Physical Exam Vitals signs and nursing note reviewed.  Constitutional:      General: He is not in acute distress.    Appearance: He is well-developed.  HENT:     Head: Normocephalic and atraumatic.     Nose: Mucosal edema (mild) and rhinorrhea present.     Right Turbinates: Pale.     Left Turbinates: Pale.     Right Sinus: No maxillary sinus tenderness or frontal sinus tenderness.     Left Sinus: No maxillary sinus tenderness or frontal sinus tenderness.  Eyes:   Conjunctiva/sclera: Conjunctivae normal.     Pupils: Pupils are equal, round, and reactive to light.  Neck:     Musculoskeletal: Normal range of motion.  Cardiovascular:     Rate and Rhythm: Normal rate and regular rhythm.     Heart sounds: Normal heart sounds.  Pulmonary:     Effort: Pulmonary effort is normal. No respiratory distress.     Breath sounds: Normal breath sounds.  Abdominal:     General: Bowel sounds are normal. There is no distension.     Palpations: Abdomen is soft.  Musculoskeletal: Normal range of motion.  Skin:    General: Skin is warm and dry.  Neurological:     Mental Status: He is alert and oriented to person, place, and time.  Psychiatric:        Behavior: Behavior normal.        Thought Content: Thought content normal.     ASSESSMENT/PLAN:  1. Essential hypertension The patient is asked to make an attempt to improve diet and exercise patterns to aid in medical management of this problem.  - Urinalysis Dipstick  2. Diabetes mellitus type 2 in obese [facility](HCC) Pending labs. Will adjust medications accordingly.    - Urine Culture  3. Morbid obesity (HCC) The patient is asked to make an attempt to improve diet and exercise patterns to aid in medical management of this  problem.   4. Abnormal urinalysis Culture pending - Urine Culture  5. Chronic frontal sinusitis Patient is being followed by a specialist for this condition. Patient advised to continue with follow up appointments. PCP will continue to monitor progress.   Started on medrol. Continue with nasal lavage.  - methylPREDNISolone (MEDROL DOSEPAK) 4 MG TBPK tablet; Take as directed on pack  Dispense: 21 tablet; Refill: 0     Return in about 3 months (around 08/13/2018) for htn.    The patient was given clear instructions to go to ER or return to medical center if symptoms do not improve, worsen or new problems develop. The patient verbalized understanding and agreed with plan of care.   Ms.  Freda Jackson. Riley Lam, FNP-BC [facility] [address] [place], [place] [address] [phone]

## 2018-05-14 NOTE — Patient Instructions (Signed)

## 2018-05-15 ENCOUNTER — Other Ambulatory Visit: Payer: Self-pay | Admitting: Family Medicine

## 2018-05-15 DIAGNOSIS — E1169 Type 2 diabetes mellitus with other specified complication: Secondary | ICD-10-CM

## 2018-05-15 DIAGNOSIS — E669 Obesity, unspecified: Principal | ICD-10-CM

## 2018-05-17 ENCOUNTER — Telehealth: Payer: Self-pay

## 2018-05-17 MED ORDER — CEFDINIR 300 MG PO CAPS: 300.0000 mg | ORAL_CAPSULE | Freq: Two times a day (BID) | ORAL | 0 refills | Status: AC

## 2018-05-17 NOTE — Telephone Encounter (Signed)
Called spoke with patient. Advised that he is positive for UTI. He has not seen ENT at this time. He was advised that the antibiotic that has been sent into pharmacy will cover the uti and sinus infection. Patient verbalized understanding. Thanks!

## 2018-05-17 NOTE — Telephone Encounter (Signed)
-----   Message from Mike Gip, FNP sent at 05/17/2018 12:03 PM EST ----- Please let patient know that he was positive for a UTI. Please see if his ENT started him on antibx for his sinus infection. If not, I sent an antibiotic to the pharmacy for the UTI that will cover the UTI and sinus infection.

## 2018-05-17 NOTE — Addendum Note (Signed)
Addended by: Reatha HarpsUGLAS, Aydin Hink L on: 05/17/2018 12:01 PM   Modules accepted: Orders

## 2018-05-20 LAB — URINE CULTURE

## 2018-05-22 ENCOUNTER — Other Ambulatory Visit: Payer: Self-pay | Admitting: Family Medicine

## 2018-05-22 DIAGNOSIS — I10 Essential (primary) hypertension: Secondary | ICD-10-CM

## 2018-05-30 ENCOUNTER — Other Ambulatory Visit: Payer: Self-pay | Admitting: Family Medicine

## 2018-05-30 DIAGNOSIS — J3089 Other allergic rhinitis: Secondary | ICD-10-CM

## 2018-06-18 ENCOUNTER — Other Ambulatory Visit: Payer: Self-pay | Admitting: Family Medicine

## 2018-06-18 DIAGNOSIS — I10 Essential (primary) hypertension: Secondary | ICD-10-CM

## 2018-06-30 ENCOUNTER — Other Ambulatory Visit: Payer: Self-pay | Admitting: Family Medicine

## 2018-06-30 DIAGNOSIS — J3089 Other allergic rhinitis: Secondary | ICD-10-CM

## 2018-07-15 ENCOUNTER — Telehealth: Payer: Self-pay

## 2018-07-15 ENCOUNTER — Other Ambulatory Visit: Payer: Self-pay

## 2018-07-15 DIAGNOSIS — R062 Wheezing: Secondary | ICD-10-CM

## 2018-07-15 MED ORDER — ALBUTEROL SULFATE HFA 108 (90 BASE) MCG/ACT IN AERS: 2.0000 | INHALATION_SPRAY | Freq: Four times a day (QID) | RESPIRATORY_TRACT | 0 refills | Status: DC | PRN

## 2018-07-15 NOTE — Telephone Encounter (Signed)
Patient notified that medication has been sent 

## 2018-07-26 ENCOUNTER — Other Ambulatory Visit: Payer: Self-pay | Admitting: Family Medicine

## 2018-07-26 DIAGNOSIS — J3089 Other allergic rhinitis: Secondary | ICD-10-CM

## 2018-07-26 DIAGNOSIS — I10 Essential (primary) hypertension: Secondary | ICD-10-CM

## 2018-08-13 ENCOUNTER — Ambulatory Visit (INDEPENDENT_AMBULATORY_CARE_PROVIDER_SITE_OTHER): Payer: PRIVATE HEALTH INSURANCE | Admitting: Family Medicine

## 2018-08-13 ENCOUNTER — Other Ambulatory Visit: Payer: Self-pay

## 2018-08-13 DIAGNOSIS — J3089 Other allergic rhinitis: Secondary | ICD-10-CM | POA: Diagnosis not present

## 2018-08-13 DIAGNOSIS — E669 Obesity, unspecified: Secondary | ICD-10-CM

## 2018-08-13 DIAGNOSIS — I10 Essential (primary) hypertension: Secondary | ICD-10-CM

## 2018-08-13 DIAGNOSIS — E1169 Type 2 diabetes mellitus with other specified complication: Secondary | ICD-10-CM | POA: Diagnosis not present

## 2018-08-13 MED ORDER — METFORMIN HCL 500 MG PO TABS: 500.0000 mg | ORAL_TABLET | Freq: Two times a day (BID) | ORAL | 2 refills | Status: DC

## 2018-08-13 MED ORDER — AMLODIPINE BESYLATE 10 MG PO TABS: 10.0000 mg | ORAL_TABLET | Freq: Every day | ORAL | 1 refills | Status: DC

## 2018-08-13 MED ORDER — MONTELUKAST SODIUM 10 MG PO TABS: 10.0000 mg | ORAL_TABLET | Freq: Every day | ORAL | 3 refills | Status: DC

## 2018-08-13 NOTE — Progress Notes (Signed)
  [facility]  Virtual Visit via Telephone Note  I connected with Scott Buchanan on 08/13/18 at  3:20 PM EDT by telephone and verified that I am speaking with the correct person using two identifiers.   I discussed the limitations, risks, security and privacy concerns of performing an evaluation and management service by telephone and the availability of in person appointments. I also discussed with the patient that there may be a patient responsible charge related to this service. The patient expressed understanding and agreed to proceed.   History of Present Illness: Scott Buchanan  has a past medical history of Arthritis, Asthma, Diabetes mellitus without complication (HCC), Hypertension, and Primary localized osteoarthritis of right knee (03/02/2018). Patient reports compliance with medications and denies side effects at the present time. He does not report checking his fasting blood sugars or exercising on a regular basis. He is not following a carb modified diet.    Observations/Objective: Patient with regular voice tone, rate and rhythm. Speaking calmly and is in no apparent distress.    Assessment and Plan: 1. Non-seasonal allergic rhinitis, unspecified trigger - montelukast (SINGULAIR) 10 MG tablet; Take 1 tablet (10 mg total) by mouth at bedtime.  Dispense: 90 tablet; Refill: 3  2. Diabetes mellitus type 2 in obese (HCC) - metFORMIN (GLUCOPHAGE) 500 MG tablet; Take 1 tablet (500 mg total) by mouth 2 (two) times daily with a meal.  Dispense: 180 tablet; Refill: 2  3. Essential hypertension - amLODipine (NORVASC) 10 MG tablet; Take 1 tablet (10 mg total) by mouth daily.  Dispense: 90 tablet; Refill: 1     Follow Up Instructions:  We discussed hand washing, using hand sanitizer when soap and water are not available, only going out when absolutely necessary, and social distancing. Explained to patient that he is immunocompromised  and will need to take precautions during this time.   I discussed the assessment and treatment plan with the patient. The patient was provided an opportunity to ask questions and all were answered. The patient agreed with the plan and demonstrated an understanding of the instructions.   The patient was advised to call back or seek an in-person evaluation if the symptoms worsen or if the condition fails to improve as anticipated.  I provided 7 minutes of non-face-to-face time during this encounter.  Ms. Andr L. Riley Lam, FNP-BC [facility] [address] [place], [place] [address] [phone]

## 2018-08-16 ENCOUNTER — Other Ambulatory Visit (INDEPENDENT_AMBULATORY_CARE_PROVIDER_SITE_OTHER): Payer: PRIVATE HEALTH INSURANCE

## 2018-08-16 ENCOUNTER — Other Ambulatory Visit: Payer: Self-pay

## 2018-08-16 DIAGNOSIS — E669 Obesity, unspecified: Secondary | ICD-10-CM

## 2018-08-16 DIAGNOSIS — E1169 Type 2 diabetes mellitus with other specified complication: Secondary | ICD-10-CM

## 2018-08-16 DIAGNOSIS — I10 Essential (primary) hypertension: Secondary | ICD-10-CM

## 2018-08-16 LAB — POCT GLYCOSYLATED HEMOGLOBIN (HGB A1C): Hemoglobin A1C: 8.1 % — AB (ref 4.0–5.6)

## 2018-08-17 LAB — COMPREHENSIVE METABOLIC PANEL
ALT: 94 IU/L — ABNORMAL HIGH (ref 0–44)
AST: 56 IU/L — ABNORMAL HIGH (ref 0–40)
Albumin/Globulin Ratio: 1.9 (ref 1.2–2.2)
Albumin: 4.7 g/dL (ref 3.8–4.9)
Alkaline Phosphatase: 63 IU/L (ref 39–117)
BUN/Creatinine Ratio: 14 (ref 9–20)
BUN: 12 mg/dL (ref 6–24)
Bilirubin Total: 0.4 mg/dL (ref 0.0–1.2)
CO2: 20 mmol/L (ref 20–29)
Calcium: 9.9 mg/dL (ref 8.7–10.2)
Chloride: 100 mmol/L (ref 96–106)
Creatinine, Ser: 0.85 mg/dL (ref 0.76–1.27)
GFR calc Af Amer: 117 mL/min/{1.73_m2} (ref >59)
GFR calc non Af Amer: 101 mL/min/{1.73_m2} (ref >59)
Globulin, Total: 2.5 g/dL (ref 1.5–4.5)
Glucose: 189 mg/dL — ABNORMAL HIGH (ref 65–99)
Potassium: 4.1 mmol/L (ref 3.5–5.2)
Sodium: 138 mmol/L (ref 134–144)
Total Protein: 7.2 g/dL (ref 6.0–8.5)

## 2018-08-17 LAB — LIPID PANEL
Chol/HDL Ratio: 5.7 ratio — ABNORMAL HIGH (ref 0.0–5.0)
Cholesterol, Total: 194 mg/dL (ref 100–199)
HDL: 34 mg/dL — ABNORMAL LOW (ref >39)
LDL Calculated: 103 mg/dL — ABNORMAL HIGH (ref 0–99)
Triglycerides: 285 mg/dL — ABNORMAL HIGH (ref 0–149)
VLDL Cholesterol Cal: 57 mg/dL — ABNORMAL HIGH (ref 5–40)

## 2018-08-19 ENCOUNTER — Telehealth: Payer: Self-pay

## 2018-08-19 NOTE — Telephone Encounter (Signed)
Called and spoke with patient, advised that a1c has increased to 8.1 and that cholesterol has also increased. Patient he has been taking medications as prescribed. I advised him to eat a lower carbohydrate diet and exercise 150 minutes a week. Advised that we will repeat labs in 3 months. Patient verbalized understanding. Thanks!

## 2018-08-19 NOTE — Telephone Encounter (Signed)
-----   Message from Mike Gip, FNP sent at 08/19/2018 12:09 PM EDT ----- A1c has increased from 5.8 to 8.1. Your cholesterol has also increased. Please verify that patient is taking all medications as prescribed. If he is, then he needs to eat a lower carbohydrate diet. Exercise 150 minutes each week. I will repeat in 3 months.

## 2018-09-06 ENCOUNTER — Telehealth: Payer: Self-pay

## 2018-09-08 NOTE — Telephone Encounter (Signed)
Sent to provider 

## 2018-11-12 ENCOUNTER — Ambulatory Visit: Payer: PRIVATE HEALTH INSURANCE | Admitting: Family Medicine

## 2018-11-22 ENCOUNTER — Other Ambulatory Visit: Payer: Self-pay

## 2018-11-22 ENCOUNTER — Ambulatory Visit (INDEPENDENT_AMBULATORY_CARE_PROVIDER_SITE_OTHER): Payer: PRIVATE HEALTH INSURANCE | Admitting: Family Medicine

## 2018-11-22 ENCOUNTER — Encounter: Payer: Self-pay | Admitting: Family Medicine

## 2018-11-22 VITALS — BP 153/95 | HR 101 | Temp 98.7°F | Resp 18 | Ht 76.0 in | Wt 356.0 lb

## 2018-11-22 DIAGNOSIS — E669 Obesity, unspecified: Secondary | ICD-10-CM | POA: Diagnosis not present

## 2018-11-22 DIAGNOSIS — Z9109 Other allergy status, other than to drugs and biological substances: Secondary | ICD-10-CM | POA: Diagnosis not present

## 2018-11-22 DIAGNOSIS — J321 Chronic frontal sinusitis: Secondary | ICD-10-CM

## 2018-11-22 DIAGNOSIS — I10 Essential (primary) hypertension: Secondary | ICD-10-CM | POA: Diagnosis not present

## 2018-11-22 DIAGNOSIS — E1169 Type 2 diabetes mellitus with other specified complication: Secondary | ICD-10-CM | POA: Diagnosis not present

## 2018-11-22 DIAGNOSIS — A048 Other specified bacterial intestinal infections: Secondary | ICD-10-CM

## 2018-11-22 DIAGNOSIS — E785 Hyperlipidemia, unspecified: Secondary | ICD-10-CM | POA: Diagnosis not present

## 2018-11-22 DIAGNOSIS — R12 Heartburn: Secondary | ICD-10-CM

## 2018-11-22 DIAGNOSIS — G8929 Other chronic pain: Secondary | ICD-10-CM

## 2018-11-22 DIAGNOSIS — M25561 Pain in right knee: Secondary | ICD-10-CM

## 2018-11-22 DIAGNOSIS — B354 Tinea corporis: Secondary | ICD-10-CM

## 2018-11-22 LAB — POCT URINALYSIS DIPSTICK
Bilirubin, UA: NEGATIVE
Blood, UA: NEGATIVE
Glucose, UA: POSITIVE — AB
Ketones, UA: NEGATIVE
Leukocytes, UA: NEGATIVE
Nitrite, UA: NEGATIVE
Protein, UA: POSITIVE — AB
Spec Grav, UA: 1.015 (ref 1.010–1.025)
Urobilinogen, UA: 0.2 E.U./dL
pH, UA: 5.5 (ref 5.0–8.0)

## 2018-11-22 LAB — POCT GLYCOSYLATED HEMOGLOBIN (HGB A1C): Hemoglobin A1C: 8.2 % — AB (ref 4.0–5.6)

## 2018-11-22 MED ORDER — LEVOCETIRIZINE DIHYDROCHLORIDE 5 MG PO TABS: 5.0000 mg | ORAL_TABLET | Freq: Every evening | ORAL | 2 refills | Status: DC

## 2018-11-22 MED ORDER — METFORMIN HCL 1000 MG PO TABS: 1000.0000 mg | ORAL_TABLET | Freq: Two times a day (BID) | ORAL | 3 refills | Status: DC

## 2018-11-22 MED ORDER — OMEPRAZOLE 40 MG PO CPDR: 40.0000 mg | DELAYED_RELEASE_CAPSULE | Freq: Every day | ORAL | 3 refills | Status: DC

## 2018-11-22 MED ORDER — HYDROCHLOROTHIAZIDE 12.5 MG PO TABS: 12.5000 mg | ORAL_TABLET | Freq: Every day | ORAL | 3 refills | Status: DC

## 2018-11-22 MED ORDER — MELOXICAM 7.5 MG PO TABS: 7.5000 mg | ORAL_TABLET | Freq: Every day | ORAL | 0 refills | Status: DC | PRN

## 2018-11-22 MED ORDER — KETOCONAZOLE 2 % EX CREA: 1.0000 "application " | TOPICAL_CREAM | Freq: Every day | CUTANEOUS | 0 refills | Status: DC

## 2018-11-22 MED ORDER — AMOXICILLIN-POT CLAVULANATE 875-125 MG PO TABS: 1.0000 | ORAL_TABLET | Freq: Two times a day (BID) | ORAL | 0 refills | Status: DC

## 2018-11-22 MED ORDER — ATORVASTATIN CALCIUM 20 MG PO TABS: 20.0000 mg | ORAL_TABLET | Freq: Every day | ORAL | 3 refills | Status: DC

## 2018-11-22 NOTE — Progress Notes (Signed)
[facility]   Progress Note: General Provider: Lanae Boast, FNP  SUBJECTIVE:   Scott Buchanan is a 53 y.o. male who  has a past medical history of Arthritis, Asthma, Diabetes mellitus without complication ([place]), Hypertension, and Primary localized osteoarthritis of right knee (03/02/2018).. Patient presents today for Hypertension, Facial Pain, Nasal Congestion (green nasal congestion ), Heartburn (patient wakes up with heartburn ), Rash (on both legs, itching ), and Diabetes  Patient reports compliance with medication. Non compliant with diet and exercise. Patient states that he is having sinus pain and pressure x 1 week. He has a history of chronic sinusitis. He states that he is having heartburn that is worse at night. Also states a rash on bilateral lower extremities that he has use otc cream with slight relief.   ROS   OBJECTIVE: BP (!) 153/95 (BP Location: Left Arm, Patient Position: Sitting, Cuff Size: Large)   Pulse (!) 101   Temp 98.7 F (37.1 C) (Oral)   Resp 18   Ht 6\' 4"  (1.93 m)   Wt (!) 356 lb (161.5 kg)   SpO2 96%   BMI 43.33 kg/m   Wt Readings from Last 3 Encounters:  11/22/18 (!) 356 lb (161.5 kg)  05/14/18 (!) 350 lb (158.8 kg)  03/02/18 (!) 328 lb (148.8 kg)     Physical Exam Vitals signs and nursing note reviewed.  Constitutional:      General: He is not in acute distress.    Appearance: Normal appearance.  HENT:     Head: Normocephalic and atraumatic.     Right Ear: Tympanic membrane normal.     Left Ear: Tympanic membrane normal.     Nose: Congestion and rhinorrhea present.     Right Turbinates: Pale.     Left Turbinates: Pale.     Right Sinus: Maxillary sinus tenderness and frontal sinus tenderness present.     Left Sinus: Maxillary sinus tenderness and frontal sinus tenderness present.  Eyes:     Extraocular Movements: Extraocular movements intact.     Conjunctiva/sclera: Conjunctivae normal.    Pupils: Pupils are equal, round, and reactive to light.  Cardiovascular:     Rate and Rhythm: Normal rate and regular rhythm.     Heart sounds: No murmur.  Pulmonary:     Effort: Pulmonary effort is normal.     Breath sounds: Normal breath sounds.  Musculoskeletal: Normal range of motion.  Skin:    General: Skin is warm and dry.     Findings: Rash present. Rash is papular (erythematous rash with central clearing).       Neurological:     Mental Status: He is alert and oriented to person, place, and time.  Psychiatric:        Mood and Affect: Mood normal.        Behavior: Behavior normal.        Thought Content: Thought content normal.        Judgment: Judgment normal.        Diabetic Foot Exam - Simple   Simple Foot Form Visual Inspection See comments: Yes Sensation Testing Intact to touch and monofilament testing bilaterally: Yes Pulse Check Posterior Tibialis and Dorsalis pulse intact bilaterally: Yes Comments Callouses noted to the bilateral great toes lateral side. Callused skin noted to bilateral heels. Thickening and yellowing of all toenails. Great toenails are worse than the other digits.      ASSESSMENT/PLAN:  1. Essential hypertension Continue with  current medications.   - Urinalysis Dipstick - hydrochlorothiazide (HYDRODIURIL) 12.5 MG tablet; Take 1 tablet (12.5 mg total) by mouth daily.  Dispense: 90 tablet; Refill: 3  2. Diabetes mellitus type 2 in obese (HCC) Discussed diet and exercise. Increased metformin to 1000mg  BID.  - HgB A1c - metFORMIN (GLUCOPHAGE) 1000 MG tablet; Take 1 tablet (1,000 mg total) by mouth 2 (two) times daily with a meal.  Dispense: 180 tablet; Refill: 3  3. Allergy to environmental factors - levocetirizine (XYZAL) 5 MG tablet; Take 1 tablet (5 mg total) by mouth every evening.  Dispense: 90 tablet; Refill: 2  4. Hyperlipidemia LDL goal <100 - atorvastatin (LIPITOR) 20 MG tablet; Take 1 tablet (20 mg total) by mouth daily.   Dispense: 90 tablet; Refill: 3  5. Tinea corporis - ketoconazole (NIZORAL) 2 % cream; Apply 1 application topically daily.  Dispense: 30 g; Refill: 0  6. Chronic frontal sinusitis Sinus lavage recommended.  - amoxicillin-clavulanate (AUGMENTIN) 875-125 MG tablet; Take 1 tablet by mouth 2 (two) times daily.  Dispense: 20 tablet; Refill: 0  7. Chronic pain of right knee Low dose of mobic added. Will monitor kidney function.  - meloxicam (MOBIC) 7.5 MG tablet; Take 1 tablet (7.5 mg total) by mouth daily as needed (Inflammation).  Dispense: 30 tablet; Refill: 0  8. Heartburn Trial of omeprazole. Discussed sitting up after meals.   - H. pylori breath test - omeprazole (PRILOSEC) 40 MG capsule; Take 1 capsule (40 mg total) by mouth daily.  Dispense: 30 capsule; Refill: 3    Return in about 3 months (around 02/22/2019) for dm.    The patient was given clear instructions to go to ER or return to medical center if symptoms do not improve, worsen or new problems develop. The patient verbalized understanding and agreed with plan of care.   Ms. Freda Jacksonndr L. Riley Lamouglas, FNP-BC [facility] [address]  [place], [place] [address] [phone]

## 2018-11-22 NOTE — Patient Instructions (Signed)
Sinusitis, Adult Sinusitis is soreness and swelling (inflammation) of your sinuses. Sinuses are hollow spaces in the bones around your face. They are located:  Around your eyes.  In the middle of your forehead.  Behind your nose.  In your cheekbones. Your sinuses and nasal passages are lined with a fluid called mucus. Mucus drains out of your sinuses. Swelling can trap mucus in your sinuses. This lets germs (bacteria, virus, or fungus) grow, which leads to infection. Most of the time, this condition is caused by a virus. What are the causes? This condition is caused by:  Allergies.  Asthma.  Germs.  Things that block your nose or sinuses.  Growths in the nose (nasal polyps).  Chemicals or irritants in the air.  Fungus (rare). What increases the risk? You are more likely to develop this condition if:  You have a weak body defense system (immune system).  You do a lot of swimming or diving.  You use nasal sprays too much.  You smoke. What are the signs or symptoms? The main symptoms of this condition are pain and a feeling of pressure around the sinuses. Other symptoms include:  Stuffy nose (congestion).  Runny nose (drainage).  Swelling and warmth in the sinuses.  Headache.  Toothache.  A cough that may get worse at night.  Mucus that collects in the throat or the back of the nose (postnasal drip).  Being unable to smell and taste.  Being very tired (fatigue).  A fever.  Sore throat.  Bad breath. How is this diagnosed? This condition is diagnosed based on:  Your symptoms.  Your medical history.  A physical exam.  Tests to find out if your condition is short-term (acute) or long-term (chronic). Your doctor may: ? Check your nose for growths (polyps). ? Check your sinuses using a tool that has a light (endoscope). ? Check for allergies or germs. ? Do imaging tests, such as an MRI or CT scan. How is this treated? Treatment for this condition  depends on the cause and whether it is short-term or long-term.  If caused by a virus, your symptoms should go away on their own within 10 days. You may be given medicines to relieve symptoms. They include: ? Medicines that shrink swollen tissue in the nose. ? Medicines that treat allergies (antihistamines). ? A spray that treats swelling of the nostrils. ? Rinses that help get rid of thick mucus in your nose (nasal saline washes).  If caused by bacteria, your doctor may wait to see if you will get better without treatment. You may be given antibiotic medicine if you have: ? A very bad infection. ? A weak body defense system.  If caused by growths in the nose, you may need to have surgery. Follow these instructions at home: Medicines  Take, use, or apply over-the-counter and prescription medicines only as told by your doctor. These may include nasal sprays.  If you were prescribed an antibiotic medicine, take it as told by your doctor. Do not stop taking the antibiotic even if you start to feel better. Hydrate and humidify   Drink enough water to keep your pee (urine) pale yellow.  Use a cool mist humidifier to keep the humidity level in your home above 50%.  Breathe in steam for 10-15 minutes, 3-4 times a day, or as told by your doctor. You can do this in the bathroom while a hot shower is running.  Try not to spend time in cool or dry air.   Rest  Rest as much as you can.  Sleep with your head raised (elevated).  Make sure you get enough sleep each night. General instructions   Put a warm, moist washcloth on your face 3-4 times a day, or as often as told by your doctor. This will help with discomfort.  Wash your hands often with soap and water. If there is no soap and water, use hand sanitizer.  Do not smoke. Avoid being around people who are smoking (secondhand smoke).  Keep all follow-up visits as told by your doctor. This is important. Contact a doctor if:  You  have a fever.  Your symptoms get worse.  Your symptoms do not get better within 10 days. Get help right away if:  You have a very bad headache.  You cannot stop throwing up (vomiting).  You have very bad pain or swelling around your face or eyes.  You have trouble seeing.  You feel confused.  Your neck is stiff.  You have trouble breathing. Summary  Sinusitis is swelling of your sinuses. Sinuses are hollow spaces in the bones around your face.  This condition is caused by tissues in your nose that become inflamed or swollen. This traps germs. These can lead to infection.  If you were prescribed an antibiotic medicine, take it as told by your doctor. Do not stop taking it even if you start to feel better.  Keep all follow-up visits as told by your doctor. This is important. This information is not intended to replace advice given to you by your health care provider. Make sure you discuss any questions you have with your health care provider. Document Released: 09/24/2007 Document Revised: 09/07/2017 Document Reviewed: 09/07/2017 Elsevier Patient Education  2020 [employer] Body Ringworm Body ringworm is an infection of the skin that often causes a ring-shaped rash. Body ringworm is also called tinea corporis. Body ringworm can affect any part of your skin. This condition is easily spread from person to person (is very contagious). What are the causes? This condition is caused by fungi called dermatophytes. The condition develops when these fungi grow out of control on the skin. You can get this condition if you touch a person or animal that has it. You can also get it if you share any items with an infected person or pet. These include:  Clothing, bedding, and towels.  Brushes or combs.  Gym equipment.  Any other object that has the fungus on it. What increases the risk? You are more likely to develop this condition if you:  Play sports that involve close physical  contact, such as wrestling.  Sweat a lot.  Live in areas that are hot and humid.  Use public showers.  Have a weakened immune system. What are the signs or symptoms? Symptoms of this condition include:  Itchy, raised red spots and bumps.  Red scaly patches.  A ring-shaped rash. The rash may have: ? A clear center. ? Scales or red bumps at its center. ? Redness near its borders. ? Dry and scaly skin on or around it. How is this diagnosed? This condition can usually be diagnosed with a skin exam. A skin scraping may be taken from the affected area and examined under a microscope to see if the fungus is present. How is this treated? This condition may be treated with:  An antifungal cream or ointment.  An antifungal shampoo.  Antifungal medicines. These may be prescribed if your ringworm: ? Is severe. ? Keeps  coming back. ? Lasts a long time. Follow these instructions at home:  Take over-the-counter and prescription medicines only as told by your health care provider.  If you were given an antifungal cream or ointment: ? Use it as told by your health care provider. ? Wash the infected area and dry it completely before applying the cream or ointment.  If you were given an antifungal shampoo: ? Use it as told by your health care provider. ? Leave the shampoo on your body for 3-5 minutes before rinsing.  While you have a rash: ? Wear loose clothing to stop clothes from rubbing and irritating it. ? Wash or change your bed sheets every night. ? Disinfect or throw out items that may be infected. ? Wash clothes and bed sheets in hot water. ? Wash your hands often with soap and water. If soap and water are not available, use hand sanitizer.  If your pet has the same infection, take your pet to see a veterinarian for treatment. How is this prevented?  Take a bath or shower every day and after every time you work out or play sports.  Dry your skin completely after  bathing.  Wear sandals or shoes in public places and showers.  Change your clothes every day.  Wash athletic clothes after each use.  Do not share personal items with others.  Avoid touching red patches of skin on other people.  Avoid touching pets that have bald spots.  If you touch an animal that has a bald spot, wash your hands. Contact a health care provider if:  Your rash continues to spread after 7 days of treatment.  Your rash is not gone in 4 weeks.  The area around your rash gets red, warm, tender, and swollen. Summary  Body ringworm is an infection of the skin that often causes a ring-shaped rash.  This condition is easily spread from person to person (is very contagious).  This condition may be treated with antifungal cream or ointment, antifungal shampoo, or antifungal medicines.  Take over-the-counter and prescription medicines only as told by your health care provider. This information is not intended to replace advice given to you by your health care provider. Make sure you discuss any questions you have with your health care provider. Document Released: 04/04/2000 Document Revised: 12/04/2017 Document Reviewed: 12/04/2017 Elsevier Patient Education  2020 [employer].

## 2018-11-23 LAB — H. PYLORI BREATH TEST: H pylori Breath Test: POSITIVE — AB

## 2018-11-24 MED ORDER — PANTOPRAZOLE SODIUM 20 MG PO TBEC: 20.0000 mg | DELAYED_RELEASE_TABLET | Freq: Two times a day (BID) | ORAL | 0 refills | Status: DC

## 2018-11-24 MED ORDER — METRONIDAZOLE 500 MG PO TABS: 500.0000 mg | ORAL_TABLET | Freq: Two times a day (BID) | ORAL | 0 refills | Status: AC

## 2018-11-24 MED ORDER — AMOXICILLIN 500 MG PO TABS: 1000.0000 mg | ORAL_TABLET | Freq: Two times a day (BID) | ORAL | 0 refills | Status: AC

## 2018-11-24 NOTE — Progress Notes (Signed)
Please let patient know that he was positive for H. Pylori. This is a bacteria that causes ulcers. The treatment is a combination of medications that are taken for 14 days. I will send them in to the pharmacy.

## 2018-11-24 NOTE — Addendum Note (Signed)
Addended by: Genelle Bal on: 11/24/2018 05:10 PM   Modules accepted: Orders

## 2018-11-25 ENCOUNTER — Telehealth: Payer: Self-pay

## 2018-11-25 NOTE — Telephone Encounter (Signed)
-----   Message from Lanae Boast, [place] sent at 11/24/2018  5:10 PM EDT ----- Please let patient know that he was positive for H. Pylori. This is a bacteria that causes ulcers. The treatment is a combination of medications that are taken for 14 days. I will send them in to the pharmacy.

## 2018-11-25 NOTE — Telephone Encounter (Signed)
Called, no answer. Left a message for patient to call back. Thanks!  

## 2018-11-25 NOTE — Telephone Encounter (Signed)
Patient returned call. I advised that he was positive for H.Pylori and that this is a bacteria in stomach that can cause ulcers. Advised he will need to take 3 meds for 14 days as directed until completed. Advised that I did receive notification that 1 rx was requiring prior auth. I have submitted this via cover my meds and if it is not approved by tomorrow he is to call and let me know. Thanks!

## 2018-11-26 ENCOUNTER — Other Ambulatory Visit: Payer: Self-pay

## 2018-12-09 ENCOUNTER — Telehealth: Payer: Self-pay

## 2018-12-09 NOTE — Telephone Encounter (Signed)
Called, no answer left a message to call back. Thanks!  

## 2018-12-10 ENCOUNTER — Telehealth: Payer: Self-pay | Admitting: Family Medicine

## 2018-12-10 NOTE — Telephone Encounter (Signed)
Called and spoke with patient, he states he is still having stomach cramps and diarrhea. He was recently treated for H pylori. He says he is finishing the medication today. I advised we will need him to come in again for another evaluation. Thanks!

## 2018-12-13 ENCOUNTER — Other Ambulatory Visit: Payer: Self-pay

## 2018-12-13 ENCOUNTER — Ambulatory Visit (INDEPENDENT_AMBULATORY_CARE_PROVIDER_SITE_OTHER): Payer: PRIVATE HEALTH INSURANCE | Admitting: Family Medicine

## 2018-12-13 ENCOUNTER — Encounter: Payer: Self-pay | Admitting: Family Medicine

## 2018-12-13 VITALS — BP 173/94 | HR 98 | Temp 98.5°F | Resp 18 | Ht 76.0 in | Wt 352.0 lb

## 2018-12-13 DIAGNOSIS — R1084 Generalized abdominal pain: Secondary | ICD-10-CM

## 2018-12-13 DIAGNOSIS — R197 Diarrhea, unspecified: Secondary | ICD-10-CM | POA: Diagnosis not present

## 2018-12-13 NOTE — Progress Notes (Signed)
  Patient [name]   Progress Note: Sick Visit Provider: Lanae Boast, FNP  SUBJECTIVE:   Scott Buchanan is a 53 y.o. male who  has a past medical history of Arthritis, Asthma, Diabetes mellitus without complication ([place]), Hypertension, and Primary localized osteoarthritis of right knee (03/02/2018).. Patient presents today for Abdominal Pain (abdominal cramping getting worse ) and Leg Problem (leg cramps )  Patient states that he has had a change in his stools. Was treated for H. Pylori. Completed course and continues to have stomach pain. He states that he has been drinking electrolyte replacements and water daily. He states that he feels "drained" and is requesting  a note for time off at work.  Review of Systems  Constitutional: Positive for malaise/fatigue.  Gastrointestinal: Positive for abdominal pain and diarrhea. Negative for blood in stool, heartburn, melena, nausea and vomiting.  Musculoskeletal: Positive for myalgias.  All other systems reviewed and are negative.    OBJECTIVE: BP (!) 173/94 (BP Location: Left Arm, Patient Position: Sitting, Cuff Size: Large)   Pulse 98   Temp 98.5 F (36.9 C) (Oral)   Resp 18   Ht 6\' 4"  (1.93 m)   Wt (!) 352 lb (159.7 kg)   SpO2 99%   BMI 42.85 kg/m   Wt Readings from Last 3 Encounters:  12/13/18 (!) 352 lb (159.7 kg)  11/22/18 (!) 356 lb (161.5 kg)  05/14/18 (!) 350 lb (158.8 kg)     Physical Exam Vitals signs and nursing note reviewed.  Constitutional:      General: He is not in acute distress.    Appearance: He is well-developed.  HENT:     Head: Normocephalic and atraumatic.  Eyes:     Conjunctiva/sclera: Conjunctivae normal.     Pupils: Pupils are equal, round, and reactive to light.  Neck:     Musculoskeletal: Normal range of motion.  Cardiovascular:     Rate and Rhythm: Normal rate and regular rhythm.     Heart sounds: Normal heart sounds.  Pulmonary:     Effort:  Pulmonary effort is normal. No respiratory distress.     Breath sounds: Normal breath sounds.  Abdominal:     General: Bowel sounds are normal. There is no distension.     Palpations: Abdomen is soft.     Tenderness: There is generalized abdominal tenderness.  Musculoskeletal: Normal range of motion.  Skin:    General: Skin is warm and dry.  Neurological:     Mental Status: He is alert and oriented to person, place, and time.  Psychiatric:        Mood and Affect: Mood normal.        Behavior: Behavior normal.        Thought Content: Thought content normal.     ASSESSMENT/PLAN: .  1. Diarrhea, unspecified type Continue with electrolyte replacement drinks. BRAT diet recommended.  - Ambulatory referral to Gastroenterology  2. Generalized abdominal pain       The patient was given clear instructions to go to ER or return to medical center if symptoms do not improve, worsen or new problems develop. The patient verbalized understanding and agreed with plan of care.   Ms. Scott Sou. Nathaneil Canary, FNP-BC [facility] [address] [place], [place] [address] [phone]     This note has been created with Dragon speech recognition software and smart phrase technology. Any transcriptional errors are unintentional.

## 2018-12-13 NOTE — Patient Instructions (Signed)

## 2018-12-14 ENCOUNTER — Encounter: Payer: Self-pay | Admitting: Physician Assistant

## 2018-12-21 ENCOUNTER — Encounter: Payer: Self-pay | Admitting: Podiatry

## 2018-12-21 ENCOUNTER — Ambulatory Visit: Payer: PRIVATE HEALTH INSURANCE | Admitting: Podiatry

## 2018-12-21 ENCOUNTER — Other Ambulatory Visit: Payer: Self-pay

## 2018-12-21 DIAGNOSIS — M2011 Hallux valgus (acquired), right foot: Secondary | ICD-10-CM

## 2018-12-21 NOTE — Progress Notes (Signed)
This patient presents to the office with chief complaint of  diabetic feet.  This patient  says there  is  no pain and discomfort in their feet.  This patient says he has a painful bunion big toe right foot. The bunion is  painful walking and wearing shoes.  Patient has no history of infection or drainage from both feet.  . This patient presents  to the office today for  a foot evaluation due to history of  diabetes.  General Appearance  Alert, conversant and in no acute stress.  Vascular  Dorsalis pedis and posterior tibial  pulses are palpable  bilaterally.  Capillary return is within normal limits  bilaterally. Temperature is within normal limits  bilaterally.  Neurologic  Senn-Weinstein monofilament wire test within normal limits  bilaterally. Muscle power within normal limits bilaterally.  Nails Normotropic nails both feet.  Orthopedic  No limitations of motion of motion feet .  No crepitus or effusions noted.  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  Hammer toes 2  B/L.  Skin  normotropic skin with no porokeratosis noted bilaterally.  No signs of infections or ulcers noted.      HAV  B/L    Diabetes with no foot complications    IE    A diabetic foot exam was performed and there is no evidence of any vascular or neurologic pathology.  Patient is having symptomatic bunion right foot.  Patient will be scheduled with Dr.  Paulla Dolly for surgical consultation.   Gardiner Barefoot DPM

## 2018-12-28 ENCOUNTER — Encounter: Payer: Self-pay | Admitting: Physician Assistant

## 2018-12-28 ENCOUNTER — Encounter: Payer: Self-pay | Admitting: Gastroenterology

## 2018-12-28 ENCOUNTER — Other Ambulatory Visit: Payer: Self-pay

## 2018-12-28 ENCOUNTER — Ambulatory Visit: Payer: PRIVATE HEALTH INSURANCE | Admitting: Physician Assistant

## 2018-12-28 VITALS — BP 162/104 | HR 94 | Temp 98.7°F | Ht 76.0 in | Wt 350.0 lb

## 2018-12-28 DIAGNOSIS — R197 Diarrhea, unspecified: Secondary | ICD-10-CM | POA: Diagnosis not present

## 2018-12-28 DIAGNOSIS — A048 Other specified bacterial intestinal infections: Secondary | ICD-10-CM

## 2018-12-28 DIAGNOSIS — R1013 Epigastric pain: Secondary | ICD-10-CM | POA: Diagnosis not present

## 2018-12-28 MED ORDER — OMEPRAZOLE 40 MG PO CPDR: 40.0000 mg | DELAYED_RELEASE_CAPSULE | Freq: Two times a day (BID) | ORAL | 3 refills | Status: DC

## 2018-12-28 NOTE — Progress Notes (Signed)
Chief Complaint: Abdominal pain, bloating, epigastric cramping, diarrhea  HPI:    Scott Buchanan is a 53 year old Caucasian male with a past medical history as listed below, who was referred to me by Lanae Boast, [place] for a complaint of abdominal pain, bloating and diarrhea.      Today, the patient explains that about 45 days ago he went to see his PCP because he was having a lot of epigastric cramping and diarrhea.  Had an H. pylori breath test which was positive and was treated for 2 weeks with 3 different medicines.  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  He returned to his PCP about 2 weeks ago and was referred here.  Explains that now his diarrhea has decreased in frequency.  In fact he is now getting a soft solid stool about every other day but he is having more frequent bowel movements than prior to all of this when he was a "regular" man with one BM in the morning.  Tells me about 30 to 45 minutes after eating something he will either have diarrhea or a lot of gas.  Also describes a lot of bloating.    Continues to complain of some epigastric cramping which tends to be worse when he is outside working and getting hot.  Also sometimes worse after eating.  Has continued on Omeprazole 40 mg since starting this 45 days ago for H. pylori.  Tells me if he does not take it before he goes to sleep he will wake up in the night with reflux.    Patient did do a Cologuard test a year ago per him which was negative.    Current symptoms are making it very hard for the patient to work.  He can only work for 4 to 5 hours before getting cramping and often diarrhea.    Denies fever, chills, weight loss, anorexia or blood in his stool.  Denies NSAID use.  Past Medical History:  Diagnosis Date  . Arthritis   . Asthma   . Diabetes mellitus without complication ([place])    type 2  . Hypertension    . Primary localized osteoarthritis of right knee 03/02/2018    Past Surgical History:  Procedure Laterality Date  . CYSTECTOMY     tailbone  . ETHMOIDECTOMY Bilateral 04/07/2017   Procedure: BILATERAL ETHMOIDECTOMY AND SPHENOIDECTOMY;  Surgeon: Leta Baptist, MD;  Location: [facility];  Service: ENT;  Laterality: Bilateral;  . KNEE ARTHROSCOPY W/ MENISCAL REPAIR     right and left knee one year apart  . PARTIAL KNEE ARTHROPLASTY Right 03/02/2018   Procedure: UNICOMPARTMENTAL KNEE;  Surgeon: Marchia Bond, MD;  Location: [facility];  Service: Orthopedics;  Laterality: Right;  with block  . SINUS ENDO WITH FUSION Bilateral 04/07/2017   Procedure: BILATERAL FRONTAL RECESS SINUS EXPLORATION WITH SINUS FUSION NAVIGATION;  Surgeon: Leta Baptist, MD;  Location: [facility];  Service: ENT;  Laterality: Bilateral;  . SINUS EXPLORATION    . TONSILLECTOMY    . TURBINATE REDUCTION Bilateral 04/07/2017   Procedure: BILATERAL TURBINATE REDUCTION;  Surgeon: Leta Baptist, MD;  Location: [facility];  Service: ENT;  Laterality: Bilateral;  . unicompartmental right knee      Current Outpatient Medications  Medication Sig Dispense Refill  . albuterol (PROVENTIL HFA;VENTOLIN HFA) 108 (90 Base) MCG/ACT inhaler Inhale 2 puffs into  the lungs every 6 (six) hours as needed for wheezing or shortness of breath. 1 Inhaler 0  . amLODipine (NORVASC) 10 MG tablet Take 1 tablet (10 mg total) by mouth daily. 90 tablet 1  . amoxicillin-clavulanate (AUGMENTIN) 875-125 MG tablet Take 1 tablet by mouth 2 (two) times daily. 20 tablet 0  . Ascorbic Acid (VITAMIN C) 100 MG tablet Take 100 mg by mouth daily.    Marland Kitchen aspirin EC 325 MG tablet Take 1 tablet (325 mg total) by mouth 2 (two) times daily. 60 tablet 0  . atorvastatin (LIPITOR) 20 MG tablet Take 1 tablet (20 mg total) by mouth daily. 90 tablet 3  . cholecalciferol (VITAMIN D) 1000 units tablet Take 1,000 Units by mouth daily.    . fluticasone  (FLONASE) 50 MCG/ACT nasal spray Place 2 sprays into both nostrils daily.     . hydrochlorothiazide (HYDRODIURIL) 12.5 MG tablet Take 1 tablet (12.5 mg total) by mouth daily. 90 tablet 3  . ketoconazole (NIZORAL) 2 % cream Apply 1 application topically daily. 30 g 0  . levocetirizine (XYZAL) 5 MG tablet Take 1 tablet (5 mg total) by mouth every evening. 90 tablet 2  . meloxicam (MOBIC) 7.5 MG tablet Take 1 tablet (7.5 mg total) by mouth daily as needed (Inflammation). 30 tablet 0  . metFORMIN (GLUCOPHAGE) 1000 MG tablet Take 1 tablet (1,000 mg total) by mouth 2 (two) times daily with a meal. 180 tablet 3  . montelukast (SINGULAIR) 10 MG tablet Take 1 tablet (10 mg total) by mouth at bedtime. 90 tablet 3  . omeprazole (PRILOSEC) 40 MG capsule Take 1 capsule (40 mg total) by mouth 2 (two) times daily. 60 capsule 3  . pantoprazole (PROTONIX) 20 MG tablet Take 1 tablet (20 mg total) by mouth 2 (two) times daily for 14 days. 28 tablet 0   No current facility-administered medications for this visit.     Allergies as of 12/28/2018  . (No Known Allergies)    Family History  Problem Relation Age of Onset  . Hypertension Mother     Social History   Socioeconomic History  . Marital status: Single    Spouse name: Not on file  . Number of children: Not on file  . Years of education: Not on file  . Highest education level: Not on file  Occupational History  . Not on file  Social Needs  . Financial resource strain: Not on file  . Food insecurity    Worry: Not on file    Inability: Not on file  . Transportation needs    Medical: Not on file    Non-medical: Not on file  Tobacco Use  . Smoking status: Former Smoker    Types: Cigarettes  . Smokeless tobacco: Never Used  . Tobacco comment: quit 10 years ago  Substance and Sexual Activity  . Alcohol use: No  . Drug use: No  . Sexual activity: Yes  Lifestyle  . Physical activity    Days per week: Not on file    Minutes per session: Not  on file  . Stress: Not on file  Relationships  . Social Musician on phone: Not on file    Gets together: Not on file    Attends religious service: Not on file    Active member of club or organization: Not on file    Attends meetings of clubs or organizations: Not on file    Relationship status: Not on file  .  Intimate partner violence    Fear of current or ex partner: Not on file    Emotionally abused: Not on file    Physically abused: Not on file    Forced sexual activity: Not on file  Other Topics Concern  . Not on file  Social History Narrative  . Not on file    Review of Systems:    Constitutional: No weight loss, fever or chills Skin: No rash  Cardiovascular: No chest pain Respiratory: No SOB Gastrointestinal: See HPI and otherwise negative Genitourinary: No dysuria Neurological: No headache, dizziness or syncope Musculoskeletal: No new muscle or joint pain Hematologic: No bleeding or bruising Psychiatric: No history of depression or anxiety   Physical Exam:  Vital signs: BP (!) 162/104 (BP Location: Left Arm, Patient Position: Sitting, Cuff Size: Large)   Pulse 94   Temp 98.7 F (37.1 C) (Oral)   Ht 6\' 4"  (1.93 m)   Wt (!) 350 lb (158.8 kg)   BMI 42.60 kg/m   Constitutional:   Pleasant obese Caucasian male appears to be in NAD, Well developed, Well nourished, alert and cooperative Head:  Normocephalic and atraumatic. Eyes:   PEERL, EOMI. No icterus. Conjunctiva pink. Ears:  Normal auditory acuity. Neck:  Supple Throat: Oral cavity and pharynx without inflammation, swelling or lesion.  Respiratory: Respirations even and unlabored. Lungs clear to auscultation bilaterally.   No wheezes, crackles, or rhonchi.  Cardiovascular: Normal S1, S2. No MRG. Regular rate and rhythm. No peripheral edema, cyanosis or pallor.  Gastrointestinal:  Soft, nondistended,mild epigastric ttp. No rebound or guarding. Normal bowel sounds. No appreciable masses or  hepatomegaly. Rectal:  Not performed.  Msk:  Symmetrical without gross deformities. Without edema, no deformity or joint abnormality.  Neurologic:  Alert and  oriented x4;  grossly normal neurologically.  Skin:   Dry and intact without significant lesions or rashes. Psychiatric: Demonstrates good judgement and reason without abnormal affect or behaviors.  No recent labs or imaging.  Assessment: 1.  Epigastric pain: With H. pylori 2.  Diarrhea: Likely from H. pylori, then antibiotics, seems to be getting better with some soft solid stools now 3.  H. pylori: Recent documented infection via breath test in August, completed triple therapy 2 weeks ago, continues with symptoms; consider continued H. Pylori+/-gastritis+/-PUD  Plan: 1.  Scheduled patient for an EGD this week in the [facility] with Dr. Meridee ScoreMansouraty.  Did discuss risks, benefits, limitations and alternatives the patient agrees to proceed.  Discussed that at that time patient will have biopsies for H. pylori, to ensure its eradication.  They will also look for ulcers and other causes of continued pain. 2.  It sounds as though diarrhea is getting better, at this time recommend he start a daily probiotic such as Align. 3.  Increased patient's Omeprazole to twice daily.  40 mg by mouth twice a day.  Prescribed #60 with 3 refills. 4.  Patient to follow in clinic with me or Dr. Meridee ScoreMansouraty per recommendations after testing.  Did provide the patient with a work note for the rest of the week given that his symptoms increase when he is working outside.  He should be able to return on Monday.  Scott MeekerJennifer Lemmon, PA-C [facility] 12/28/2018, 10:24 AM  Cc: Mike Gipouglas, Andre, FNP

## 2018-12-28 NOTE — Patient Instructions (Addendum)
You have been scheduled for an endoscopy. Please follow written instructions given to you at your visit today. If you use inhalers (even only as needed), please bring them with you on the day of your procedure.   Increase Omeprazole to 40 mg twice a day.   Start Align probiotic once daily.

## 2018-12-28 NOTE — Progress Notes (Signed)
Attending Physician's Attestation   I have reviewed the chart.   I agree with the Advanced Practitioner's note, impression, and recommendations with any updates as below.  The patient's current symptoms seem to be more upper GI related. Reasonable to pursue diagnostic workup and plan for biopsies to be obtained. If negative EGD but still having discomfort will need optimization of PPI and possibly Carafte and a GI cocktail. If stool issues return would recommend initiation of Levsin as well as a Colonoscopy with biopsies even though recent Cologuard was negative per report.   Justice Britain, MD [facility] Advanced Endoscopy Office # [phone]

## 2018-12-29 ENCOUNTER — Telehealth: Payer: Self-pay

## 2018-12-29 ENCOUNTER — Encounter (HOSPITAL_COMMUNITY): Payer: Self-pay

## 2018-12-29 ENCOUNTER — Encounter (HOSPITAL_COMMUNITY): Payer: Self-pay | Admitting: *Deleted

## 2018-12-29 NOTE — Telephone Encounter (Signed)
Covid-19 screening questions   Do you now or have you had a fever in the last 14 days? NO   Do you have any respiratory symptoms of shortness of breath or cough now or in the last 14 days? NO  Do you have any family members or close contacts with diagnosed or suspected Covid-19 in the past 14 days? NO  Have you been tested for Covid-19 and found to be positive? NO        

## 2018-12-30 ENCOUNTER — Ambulatory Visit: Payer: PRIVATE HEALTH INSURANCE | Admitting: Podiatry

## 2018-12-30 ENCOUNTER — Other Ambulatory Visit: Payer: Self-pay

## 2018-12-30 ENCOUNTER — Other Ambulatory Visit (INDEPENDENT_AMBULATORY_CARE_PROVIDER_SITE_OTHER): Payer: PRIVATE HEALTH INSURANCE

## 2018-12-30 ENCOUNTER — Ambulatory Visit (AMBULATORY_SURGERY_CENTER): Payer: PRIVATE HEALTH INSURANCE | Admitting: Gastroenterology

## 2018-12-30 ENCOUNTER — Encounter: Payer: Self-pay | Admitting: Gastroenterology

## 2018-12-30 VITALS — BP 156/99 | HR 91 | Temp 98.3°F | Resp 27 | Ht 76.0 in | Wt 350.0 lb

## 2018-12-30 DIAGNOSIS — R945 Abnormal results of liver function studies: Secondary | ICD-10-CM | POA: Diagnosis not present

## 2018-12-30 DIAGNOSIS — R7989 Other specified abnormal findings of blood chemistry: Secondary | ICD-10-CM

## 2018-12-30 DIAGNOSIS — R1013 Epigastric pain: Secondary | ICD-10-CM

## 2018-12-30 DIAGNOSIS — K449 Diaphragmatic hernia without obstruction or gangrene: Secondary | ICD-10-CM | POA: Diagnosis not present

## 2018-12-30 DIAGNOSIS — K210 Gastro-esophageal reflux disease with esophagitis: Secondary | ICD-10-CM | POA: Diagnosis present

## 2018-12-30 DIAGNOSIS — A048 Other specified bacterial intestinal infections: Secondary | ICD-10-CM

## 2018-12-30 MED ORDER — SODIUM CHLORIDE 0.9 % IV SOLN: 500.0000 mL | Freq: Once | INTRAVENOUS | Status: DC

## 2018-12-30 NOTE — Progress Notes (Signed)
Pt's states no medical or surgical changes since previsit or office visit.  Temp- June Vitals- Courtney 

## 2018-12-30 NOTE — Op Note (Signed)
Scott Buchanan Patient Name: Scott Buchanan Procedure Date: 12/30/2018 3:41 PM MRN: [id number] Endoscopist: Justice Britain , MD Age: 53 Referring MD:  Date of Birth: 1965-09-18 Gender: Male Account #: [id number] Procedure:                Upper GI endoscopy Indications:              Epigastric abdominal pain, , Dyspepsia, Follow-up                            of Helicobacter pylori, Diarrhea (treated based on                            breath test with persistent symptoms post treatment) Medicines:                Monitored Anesthesia Care Procedure:                Pre-Anesthesia Assessment:                           - Prior to the procedure, a History and Physical                            was performed, and patient medications and                            allergies were reviewed. The patient's tolerance of                            previous anesthesia was also reviewed. The risks                            and benefits of the procedure and the sedation                            options and risks were discussed with the patient.                            All questions were answered, and informed consent                            was obtained. Prior Anticoagulants: The patient has                            taken no previous anticoagulant or antiplatelet                            agents except for aspirin. ASA Grade Assessment:                            III - A patient with severe systemic disease. After                            reviewing the risks and benefits, the patient was  deemed in satisfactory condition to undergo the                            procedure.                           After obtaining informed consent, the endoscope was                            passed under direct vision. Throughout the                            procedure, the patient's blood pressure, pulse, and                            oxygen saturations were  monitored continuously. The                            Endoscope was introduced through the mouth, and                            advanced to the second part of duodenum. The upper                            GI endoscopy was accomplished without difficulty.                            The patient tolerated the procedure. Scope In: Scope Out: Findings:                 No gross lesions were noted in the proximal                            esophagus and in the mid esophagus.                           LA Grade B (one or more mucosal breaks greater than                            5 mm, not extending between the tops of two mucosal                            folds) esophagitis with no bleeding was found in                            the distal esophagus.                           A small hiatal hernia was present.                           There is no endoscopic evidence of varices in the                            entire esophagus.  Patchy moderate mucosal changes characterized by                            congestion, erythema and altered texture were found                            in the entire examined stomach; nodular erosions                            noted in antral region. The changes in the stomach                            had appearance of portal gastropathy                            (snake-skinning appearance). Biopsies were taken                            with a cold forceps for histology and Helicobacter                            pylori testing.                           No gross lesions were noted in the duodenal bulb,                            in the first portion of the duodenum and in the                            second portion of the duodenum. Biopsies for                            histology were taken with a cold forceps for                            evaluation of celiac disease and enteropathy. Complications:            No immediate  complications. Estimated Blood Loss:     Estimated blood loss was minimal. Impression:               - No gross lesions in esophagus proximally.                           - LA Grade B esophagitis distally.                           - No varices noted.                           - Small hiatal hernia.                           - Congested, erythematous and texture changed  mucosa in the stomach - appearance suggestive of                            portal gastropathy, but due to history of recent HP                            treatment could be related. Biopsied.                           - No gross lesions in the duodenal bulb, in the                            first portion of the duodenum and in the second                            portion of the duodenum. Biopsied. Recommendation:           - The patient will be observed post-procedure,                            until all discharge criteria are met.                           - Discharge patient to home.                           - Patient has a contact number available for                            emergencies. The signs and symptoms of potential                            delayed complications were discussed with the                            patient. Return to normal activities tomorrow.                            Written discharge instructions were provided to the                            patient.                           - Resume previous diet.                           - Continue present medications.                           - Await pathology results.                           - Abdominal Ultrasound to further evaluate the  Liver, Liver vasculature, spleen and other                            abdominal organs. Cross-sectional CT-AP may be                            required if body habitus makes evaluation difficult                            (will wait to see findings on  Abdominal U/S)                           - Based on patient's symptoms may need to consider                            diagnostic colonoscopy.                           - Continue 40 mg PO PPI BID for next 2-3 months (at                            least until next EGD to evaluate for healing of                            esophagus).                           - Repeat upper endoscopy in 2 months to check                            healing.                           - The findings and recommendations were discussed                            with the patient. Justice Britain, MD 12/30/2018 4:23:23 PM

## 2018-12-30 NOTE — Progress Notes (Signed)
Report to PACU, RN, vss, BBS= Clear.  

## 2018-12-30 NOTE — Patient Instructions (Signed)
YOU HAD AN ENDOSCOPIC PROCEDURE TODAY AT THE [facility]:   Refer to the procedure report that was given to you for any specific questions about what was found during the examination.  If the procedure report does not answer your questions, please call your gastroenterologist to clarify.  If you requested that your care partner not be given the details of your procedure findings, then the procedure report has been included in a sealed envelope for you to review at your convenience later.  YOU SHOULD EXPECT: Some feelings of bloating in the abdomen. Passage of more gas than usual.  Walking can help get rid of the air that was put into your GI tract during the procedure and reduce the bloating. If you had a lower endoscopy (such as a colonoscopy or flexible sigmoidoscopy) you may notice spotting of blood in your stool or on the toilet paper. If you underwent a bowel prep for your procedure, you may not have a normal bowel movement for a few days.  Please Note:  You might notice some irritation and congestion in your nose or some drainage.  This is from the oxygen used during your procedure.  There is no need for concern and it should clear up in a day or so.  SYMPTOMS TO REPORT IMMEDIATELY:   Following upper endoscopy (EGD)  Vomiting of blood or coffee ground material  New chest pain or pain under the shoulder blades  Painful or persistently difficult swallowing  New shortness of breath  Fever of 100F or higher  Black, tarry-looking stools  For urgent or emergent issues, a gastroenterologist can be reached at any hour by calling [phone].   DIET:  We do recommend a small meal at first, but then you may proceed to your regular diet.  Drink plenty of fluids but you should avoid alcoholic beverages for 24 hours.  ACTIVITY:  You should plan to take it easy for the rest of today and you should NOT DRIVE or use heavy machinery until tomorrow (because of the sedation medicines used  during the test).    FOLLOW UP: Our staff will call the number listed on your records 48-72 hours following your procedure to check on you and address any questions or concerns that you may have regarding the information given to you following your procedure. If we do not reach you, we will leave a message.  We will attempt to reach you two times.  During this call, we will ask if you have developed any symptoms of COVID 19. If you develop any symptoms (ie: fever, flu-like symptoms, shortness of breath, cough etc.) before then, please call [phone].  If you test positive for Covid 19 in the 2 weeks post procedure, please call and report this information to us.    If any biopsies were taken you will be contacted by phone or by letter within the next 1-3 weeks.  Please call us at [phone] if you have not heard about the biopsies in 3 weeks.    SIGNATURES/CONFIDENTIALITY: You and/or your care partner have signed paperwork which will be entered into your electronic medical record.  These signatures attest to the fact that that the information above on your After Visit Summary has been reviewed and is understood.  Full responsibility of the confidentiality of this discharge information lies with you and/or your care-partner. 

## 2018-12-31 ENCOUNTER — Telehealth: Payer: Self-pay

## 2018-12-31 DIAGNOSIS — R945 Abnormal results of liver function studies: Secondary | ICD-10-CM

## 2018-12-31 DIAGNOSIS — R7989 Other specified abnormal findings of blood chemistry: Secondary | ICD-10-CM

## 2018-12-31 DIAGNOSIS — R1013 Epigastric pain: Secondary | ICD-10-CM

## 2018-12-31 LAB — GAMMA GT: GGT: 66 U/L — ABNORMAL HIGH (ref 7–51)

## 2018-12-31 LAB — CK: Total CK: 93 U/L (ref 7–232)

## 2018-12-31 LAB — FERRITIN: Ferritin: 148.3 ng/mL (ref 22.0–322.0)

## 2018-12-31 NOTE — Telephone Encounter (Signed)
Per 9/10 procedure report pt needs [place] abd to further eval the liver, liver vasculature, spleen and other abd organs.  Left message on machine to call back

## 2019-01-01 LAB — HEPATITIS A ANTIBODY, IGM: Hep A IgM: NONREACTIVE

## 2019-01-01 LAB — HEPATITIS B SURFACE ANTIBODY,QUALITATIVE: Hep B S Ab: NONREACTIVE

## 2019-01-01 LAB — HEPATITIS B CORE ANTIBODY, TOTAL: Hep B Core Total Ab: NONREACTIVE

## 2019-01-01 LAB — HEPATITIS A ANTIBODY, TOTAL: Hepatitis A AB,Total: NONREACTIVE

## 2019-01-01 LAB — HEPATITIS B CORE ANTIBODY, IGM: Hep B C IgM: NONREACTIVE

## 2019-01-01 LAB — HEPATITIS B SURFACE ANTIGEN: Hepatitis B Surface Ag: NONREACTIVE

## 2019-01-01 LAB — ANA: Anti Nuclear Antibody (ANA): NEGATIVE

## 2019-01-03 ENCOUNTER — Telehealth: Payer: Self-pay | Admitting: *Deleted

## 2019-01-03 ENCOUNTER — Telehealth: Payer: Self-pay | Admitting: Gastroenterology

## 2019-01-03 ENCOUNTER — Telehealth: Payer: Self-pay

## 2019-01-03 NOTE — Telephone Encounter (Signed)
The patient has been notified of this information and all questions answered. The pt has been advised of the information and verbalized understanding.    

## 2019-01-03 NOTE — Telephone Encounter (Signed)
Request for PA has been submitted through Covermymeds, could possibly take up to 72 hrs for a decision. Will contact pt once we rec response back from [employer]. Message was left for pt with this information.

## 2019-01-03 NOTE — Telephone Encounter (Signed)
You have been scheduled for an abdominal ultrasound at [facility] (1st floor of hospital) on 01/07/19 at 9 am. Please arrive 15 minutes prior to your appointment for registration. Make certain not to have anything to eat or drink 6 hours prior to your appointment. Should you need to reschedule your appointment, please contact radiology at [phone]. This test typically takes about 30 minutes to perform.

## 2019-01-03 NOTE — Telephone Encounter (Signed)
  Follow up Call-  Call back number 12/30/2018  Post procedure Call Back phone  # [phone]  Permission to leave phone message Yes     Patient questions:  Do you have a fever, pain , or abdominal swelling? No. Pain Score  0 *  Have you tolerated food without any problems? Yes.    Have you been able to return to your normal activities? Yes.    Do you have any questions about your discharge instructions: Diet   No. Medications  No. Follow up visit  No.  Do you have questions or concerns about your Care? No.  Actions: * If pain score is 4 or above: No action needed, pain <4. 1. Have you developed a fever since your procedure? no  2.   Have you had an respiratory symptoms (SOB or cough) since your procedure? no  3.   Have you tested positive for COVID 19 since your procedure no  4.   Have you had any family members/close contacts diagnosed with the COVID 19 since your procedure?  no   If yes to any of these questions please route to Joylene John, RN and Alphonsa Gin, Therapist, sports.

## 2019-01-03 NOTE — Telephone Encounter (Signed)
First follow up call attempt.  Message left on voicemail to call if any questions or concerns regarding procedure or covid19. 

## 2019-01-04 NOTE — Telephone Encounter (Signed)
Per Covermymeds approval for Omeprazole as of 01/03/2019. Pharmacy has been informed, as well as pt.

## 2019-01-07 ENCOUNTER — Ambulatory Visit (HOSPITAL_COMMUNITY)
Admission: RE | Admit: 2019-01-07 | Discharge: 2019-01-07 | Disposition: A | Discharge status: Home / Self Care | Payer: PRIVATE HEALTH INSURANCE | Source: Ambulatory Visit | Attending: Gastroenterology | Admitting: Gastroenterology

## 2019-01-07 ENCOUNTER — Other Ambulatory Visit: Payer: Self-pay

## 2019-01-07 DIAGNOSIS — R945 Abnormal results of liver function studies: Secondary | ICD-10-CM | POA: Diagnosis present

## 2019-01-07 DIAGNOSIS — R7989 Other specified abnormal findings of blood chemistry: Secondary | ICD-10-CM

## 2019-01-07 DIAGNOSIS — R1013 Epigastric pain: Secondary | ICD-10-CM | POA: Insufficient documentation

## 2019-01-11 ENCOUNTER — Encounter: Payer: Self-pay | Admitting: Gastroenterology

## 2019-01-17 ENCOUNTER — Telehealth: Payer: Self-pay | Admitting: Gastroenterology

## 2019-01-17 DIAGNOSIS — R7989 Other specified abnormal findings of blood chemistry: Secondary | ICD-10-CM

## 2019-01-17 DIAGNOSIS — R945 Abnormal results of liver function studies: Secondary | ICD-10-CM

## 2019-01-17 DIAGNOSIS — R197 Diarrhea, unspecified: Secondary | ICD-10-CM

## 2019-01-17 DIAGNOSIS — R1013 Epigastric pain: Secondary | ICD-10-CM

## 2019-01-17 NOTE — Telephone Encounter (Signed)
Tried to call the pt on the number given and got a message that the number is out of service.

## 2019-01-17 NOTE — Telephone Encounter (Signed)
Pt stated that his symptoms are not improving and would like to schedule CT.

## 2019-01-18 NOTE — Telephone Encounter (Signed)
The pt called to state he continues to have epigastric pain and diarrhea.  He was told that he could have a CT scan if his symptoms persist.  See result note from recent US.  Please advise   "Your abdominal ultrasound results have been reviewed. Your gallbladder is normal without gallstones. Your bile duct is normal in size. You have some increased echogenicity of your liver. This means that there is increased visualization of how the liver itself looks but no mass or lesion can be seen. The pancreas cannot be visualized due to overlying gas. Your spleen is normal in size. You have a normal kidney size on the right and left with a small cyst in the right kidney. There is no evidence of any aneurysm or fluid in your abdomen.  Depending on how your symptoms are, we may consider the role of a CT scan in the future to further evaluate symptoms if they are persisting.  The kidney cyst is most likely a benign finding but you can follow-up with your primary care doctor about this and see if any other work-up or referral is necessary or indicated."

## 2019-01-19 NOTE — Telephone Encounter (Signed)
[facility] radiology CT on 01/26/19 at 12 noon will need labs prior and pick up contrast 2 days prior NPO 6 hours prior The patient has been notified of this information and all questions answered.

## 2019-01-19 NOTE — Telephone Encounter (Signed)
If no contraindication, please move forward with scheduling a CT abdomen/pelvis with IV and oral contrast. Thanks. GM

## 2019-01-20 ENCOUNTER — Other Ambulatory Visit (INDEPENDENT_AMBULATORY_CARE_PROVIDER_SITE_OTHER): Payer: PRIVATE HEALTH INSURANCE

## 2019-01-20 DIAGNOSIS — R1013 Epigastric pain: Secondary | ICD-10-CM | POA: Diagnosis not present

## 2019-01-20 DIAGNOSIS — R197 Diarrhea, unspecified: Secondary | ICD-10-CM

## 2019-01-20 DIAGNOSIS — R945 Abnormal results of liver function studies: Secondary | ICD-10-CM | POA: Diagnosis not present

## 2019-01-20 DIAGNOSIS — R7989 Other specified abnormal findings of blood chemistry: Secondary | ICD-10-CM

## 2019-01-20 LAB — CREATININE, SERUM: Creatinine, Ser: 1.09 mg/dL (ref 0.40–1.50)

## 2019-01-20 LAB — BUN: BUN: 19 mg/dL (ref 6–23)

## 2019-01-26 ENCOUNTER — Ambulatory Visit (HOSPITAL_COMMUNITY)
Admission: RE | Admit: 2019-01-26 | Discharge: 2019-01-26 | Disposition: A | Discharge status: Home / Self Care | Payer: PRIVATE HEALTH INSURANCE | Source: Ambulatory Visit | Attending: Gastroenterology | Admitting: Gastroenterology

## 2019-01-26 ENCOUNTER — Other Ambulatory Visit: Payer: Self-pay

## 2019-01-26 DIAGNOSIS — R1013 Epigastric pain: Secondary | ICD-10-CM | POA: Diagnosis not present

## 2019-01-26 DIAGNOSIS — R197 Diarrhea, unspecified: Secondary | ICD-10-CM | POA: Insufficient documentation

## 2019-01-26 DIAGNOSIS — R7989 Other specified abnormal findings of blood chemistry: Secondary | ICD-10-CM

## 2019-01-26 DIAGNOSIS — R945 Abnormal results of liver function studies: Secondary | ICD-10-CM | POA: Diagnosis present

## 2019-01-26 MED ORDER — SODIUM CHLORIDE (PF) 0.9 % IJ SOLN
INTRAMUSCULAR | Status: AC
  Filled 2019-01-26: qty 50

## 2019-01-26 MED ORDER — IOHEXOL 300 MG/ML  SOLN
100.0000 mL | Freq: Once | INTRAMUSCULAR | Status: AC | PRN
  Administered 2019-01-26: 14:00:00 100 mL via INTRAVENOUS

## 2019-01-31 ENCOUNTER — Telehealth: Payer: Self-pay | Admitting: Internal Medicine

## 2019-01-31 DIAGNOSIS — R062 Wheezing: Secondary | ICD-10-CM

## 2019-01-31 MED ORDER — ALBUTEROL SULFATE HFA 108 (90 BASE) MCG/ACT IN AERS: 2.0000 | INHALATION_SPRAY | Freq: Four times a day (QID) | RESPIRATORY_TRACT | 3 refills | Status: DC | PRN

## 2019-01-31 NOTE — Telephone Encounter (Signed)
Refill for albuterol inhaler sent to pharmacy. Thanks!  

## 2019-02-01 ENCOUNTER — Other Ambulatory Visit: Payer: Self-pay

## 2019-02-01 DIAGNOSIS — R7989 Other specified abnormal findings of blood chemistry: Secondary | ICD-10-CM

## 2019-02-01 DIAGNOSIS — R945 Abnormal results of liver function studies: Secondary | ICD-10-CM

## 2019-02-03 ENCOUNTER — Other Ambulatory Visit (INDEPENDENT_AMBULATORY_CARE_PROVIDER_SITE_OTHER): Payer: PRIVATE HEALTH INSURANCE

## 2019-02-03 DIAGNOSIS — R7989 Other specified abnormal findings of blood chemistry: Secondary | ICD-10-CM

## 2019-02-03 DIAGNOSIS — R945 Abnormal results of liver function studies: Secondary | ICD-10-CM | POA: Diagnosis not present

## 2019-02-03 LAB — HEPATIC FUNCTION PANEL
ALT: 55 U/L — ABNORMAL HIGH (ref 0–53)
AST: 38 U/L — ABNORMAL HIGH (ref 0–37)
Albumin: 4.7 g/dL (ref 3.5–5.2)
Alkaline Phosphatase: 61 U/L (ref 39–117)
Bilirubin, Direct: 0.1 mg/dL (ref 0.0–0.3)
Total Bilirubin: 0.5 mg/dL (ref 0.2–1.2)
Total Protein: 7.6 g/dL (ref 6.0–8.3)

## 2019-02-25 ENCOUNTER — Ambulatory Visit: Payer: PRIVATE HEALTH INSURANCE | Admitting: Family Medicine

## 2019-03-06 ENCOUNTER — Other Ambulatory Visit: Payer: Self-pay | Admitting: Family Medicine

## 2019-03-06 DIAGNOSIS — I10 Essential (primary) hypertension: Secondary | ICD-10-CM

## 2019-03-10 ENCOUNTER — Ambulatory Visit: Payer: PRIVATE HEALTH INSURANCE | Admitting: Gastroenterology

## 2019-03-10 ENCOUNTER — Encounter: Payer: Self-pay | Admitting: Gastroenterology

## 2019-03-10 ENCOUNTER — Other Ambulatory Visit (INDEPENDENT_AMBULATORY_CARE_PROVIDER_SITE_OTHER): Payer: PRIVATE HEALTH INSURANCE

## 2019-03-10 VITALS — BP 140/100 | HR 110 | Temp 98.2°F | Ht 76.0 in | Wt 344.0 lb

## 2019-03-10 DIAGNOSIS — R1012 Left upper quadrant pain: Secondary | ICD-10-CM

## 2019-03-10 DIAGNOSIS — M7918 Myalgia, other site: Secondary | ICD-10-CM | POA: Diagnosis not present

## 2019-03-10 DIAGNOSIS — K2950 Unspecified chronic gastritis without bleeding: Secondary | ICD-10-CM

## 2019-03-10 DIAGNOSIS — R945 Abnormal results of liver function studies: Secondary | ICD-10-CM

## 2019-03-10 DIAGNOSIS — K209 Esophagitis, unspecified without bleeding: Secondary | ICD-10-CM

## 2019-03-10 DIAGNOSIS — R7989 Other specified abnormal findings of blood chemistry: Secondary | ICD-10-CM

## 2019-03-10 DIAGNOSIS — Z1159 Encounter for screening for other viral diseases: Secondary | ICD-10-CM

## 2019-03-10 LAB — HEPATIC FUNCTION PANEL
ALT: 71 U/L — ABNORMAL HIGH (ref 0–53)
AST: 44 U/L — ABNORMAL HIGH (ref 0–37)
Albumin: 4.6 g/dL (ref 3.5–5.2)
Alkaline Phosphatase: 59 U/L (ref 39–117)
Bilirubin, Direct: 0.2 mg/dL (ref 0.0–0.3)
Total Bilirubin: 0.7 mg/dL (ref 0.2–1.2)
Total Protein: 7.4 g/dL (ref 6.0–8.3)

## 2019-03-10 LAB — CK: Total CK: 143 U/L (ref 7–232)

## 2019-03-10 NOTE — Progress Notes (Signed)
GASTROENTEROLOGY OUTPATIENT CLINIC VISIT   Primary Care Provider Massie MaroonHollis, Lachina M, FNP [address] [place] [id number] [phone]  Patient Profile: Scott BlitzDavid E Buchanan is a 53 y.o. male with a pmh significant for diabetes, hypertension, asthma, arthritis, obesity, likely fatty liver disease, esophagitis.  The patient presents to the [facility] for an evaluation and management of problem(s) noted below:  Problem List 1. LUQ pain   2. Esophagitis   3. Abnormal LFTs   4. Musculoskeletal pain   5. Other chronic gastritis without hemorrhage   6. Screening for viral disease     History of Present Illness Please see [facility] for full details of HPI.  Interval History This is the patient's scheduled visit after his recent endoscopy.  He still had abdominal discomforts and underwent a cross-sectional CT abdomen/pelvis without significant findings other than hepatic steatosis as well as prostamegaly.  This was referred back to him so that he can discuss his prostate issues with his primary care provider.  He is not been able to see anyone about this as of yet but is hopeful in the next few weeks to be able to move things forward.  Overall he has done better over the course of the last few months.  When truly discussing and talking with him today it seems that the discomfort is most likely occurring approximately 2-3 times per week lasting anywhere from seconds to up to 5 minutes but then goes away on its own.  He has to rest in the region of the left upper quadrant for significant left flank right at the 7th-8th.  Region for a few minutes and then the pain can go away.  He does not have time to take put a warm compress on the area because he is always working when this is occurring or lifting.  These episodes are much less frequent currently.  They are not associated with food intake.  We went over his Cologuard testing within the last  year.  The patient is not taking significant nonsteroidals or BC/Goody powders.  Otherwise has been having some difficulty with getting twice daily PPI therapy as result of his insurance.  So he is taking it once daily.  He is not taking significant nonsteroidals or BC/Goody powders.  He feels that his pyrosis is improved on his PPI therapy.  He has not had a recurrence of diarrhea.  GI Review of Systems Positive as above Negative for dysphagia, odynophagia, nausea, vomiting, change in bowel habits, melena, hematochezia  Review of Systems General: Denies fevers/chills HEENT: Denies oral lesions Cardiovascular: Denies chest pain Pulmonary: Denies shortness of breath Gastroenterological: See HPI Genitourinary: Denies darkened urine Hematological: Denies easy bruising/bleeding Endocrine: Denies temperature intolerance Dermatological: Denies jaundice Psychological: Mood is stable   Medications Current Outpatient Medications  Medication Sig Dispense Refill  . albuterol (VENTOLIN HFA) 108 (90 Base) MCG/ACT inhaler Inhale 2 puffs into the lungs every 6 (six) hours as needed for wheezing or shortness of breath. 18 g 3  . amLODipine (NORVASC) 10 MG tablet Take 1 tablet by mouth once daily 90 tablet 0  . Ascorbic Acid (VITAMIN C) 100 MG tablet Take 100 mg by mouth daily.    Marland Kitchen. aspirin EC 81 MG tablet Take 81 mg by mouth daily.    Marland Kitchen. atorvastatin (LIPITOR) 20 MG tablet Take 1 tablet (20 mg total) by mouth daily. 90 tablet 3  . cholecalciferol (VITAMIN D) 1000 units tablet Take 1,000  Units by mouth daily.    . fluticasone (FLONASE) 50 MCG/ACT nasal spray Place 2 sprays into both nostrils daily.     . hydrochlorothiazide (HYDRODIURIL) 12.5 MG tablet Take 1 tablet (12.5 mg total) by mouth daily. 90 tablet 3  . levocetirizine (XYZAL) 5 MG tablet Take 1 tablet (5 mg total) by mouth every evening. 90 tablet 2  . metFORMIN (GLUCOPHAGE) 1000 MG tablet Take 1 tablet (1,000 mg total) by mouth 2 (two) times  daily with a meal. 180 tablet 3  . montelukast (SINGULAIR) 10 MG tablet Take 1 tablet (10 mg total) by mouth at bedtime. 90 tablet 3  . omeprazole (PRILOSEC) 40 MG capsule Take 40 mg by mouth daily.     No current facility-administered medications for this visit.     Allergies No Known Allergies  Histories Past Medical History:  Diagnosis Date  . Arthritis   . Asthma   . Diabetes mellitus without complication (HCC)    type 2  . Hypertension   . Primary localized osteoarthritis of right knee 03/02/2018   Past Surgical History:  Procedure Laterality Date  . CYSTECTOMY     tailbone  . ETHMOIDECTOMY Bilateral 04/07/2017   Procedure: BILATERAL ETHMOIDECTOMY AND SPHENOIDECTOMY;  Surgeon: Newman Pies, MD;  Location: [facility];  Service: ENT;  Laterality: Bilateral;  . KNEE ARTHROSCOPY W/ MENISCAL REPAIR     right and left knee one year apart  . PARTIAL KNEE ARTHROPLASTY Right 03/02/2018   Procedure: UNICOMPARTMENTAL KNEE;  Surgeon: Teryl Lucy, MD;  Location: [facility];  Service: Orthopedics;  Laterality: Right;  with block  . SINUS ENDO WITH FUSION Bilateral 04/07/2017   Procedure: BILATERAL FRONTAL RECESS SINUS EXPLORATION WITH SINUS FUSION NAVIGATION;  Surgeon: Newman Pies, MD;  Location: [facility];  Service: ENT;  Laterality: Bilateral;  . SINUS EXPLORATION    . TONSILLECTOMY    . TURBINATE REDUCTION Bilateral 04/07/2017   Procedure: BILATERAL TURBINATE REDUCTION;  Surgeon: Newman Pies, MD;  Location: [facility];  Service: ENT;  Laterality: Bilateral;  . unicompartmental right knee     Social History   Socioeconomic History  . Marital status: Single    Spouse name: Not on file  . Number of children: 1  . Years of education: Not on file  . Highest education level: Not on file  Occupational History  . Occupation: maintance   Social Needs  . Financial resource strain: Not on file  . Food insecurity    Worry: Not on file    Inability:  Not on file  . Transportation needs    Medical: Not on file    Non-medical: Not on file  Tobacco Use  . Smoking status: Former Smoker    Types: Cigarettes  . Smokeless tobacco: Never Used  . Tobacco comment: quit 10 years ago  Substance and Sexual Activity  . Alcohol use: No  . Drug use: No  . Sexual activity: Yes  Lifestyle  . Physical activity    Days per week: Not on file    Minutes per session: Not on file  . Stress: Not on file  Relationships  . Social Musician on phone: Not on file    Gets together: Not on file    Attends religious service: Not on file    Active member of club or organization: Not on file    Attends meetings of clubs or organizations: Not on file    Relationship status: Not on  file  . Intimate partner violence    Fear of current or ex partner: Not on file    Emotionally abused: Not on file    Physically abused: Not on file    Forced sexual activity: Not on file  Other Topics Concern  . Not on file  Social History Narrative  . Not on file   Family History  Problem Relation Age of Onset  . Hypertension Mother   . Colon cancer Neg Hx   . Colon polyps Neg Hx   . Stomach cancer Neg Hx   . Rectal cancer Neg Hx   . Esophageal cancer Neg Hx   . Inflammatory bowel disease Neg Hx   . Liver disease Neg Hx   . Pancreatic cancer Neg Hx    I have reviewed his medical, social, and family history in detail and updated the electronic medical record as necessary.    PHYSICAL EXAMINATION  BP (!) 140/100   Pulse (!) 110   Temp 98.2 F (36.8 C)   Ht 6\' 4"  (1.93 m)   Wt (!) 344 lb (156 kg)   BMI 41.87 kg/m  Wt Readings from Last 3 Encounters:  03/10/19 (!) 344 lb (156 kg)  12/30/18 (!) 350 lb (158.8 kg)  12/28/18 (!) 350 lb (158.8 kg)  GEN: NAD, appears stated age, doesn't appear chronically ill PSYCH: Cooperative, without pressured speech EYE: Conjunctivae pink, sclerae anicteric ENT: MMM CV: RR without R/Gs  RESP: CTAB posteriorly,  without wheezing GI: NABS, soft, obese, rounded, nontender, without rebound or guarding, unable to appreciate hepatosplenomegaly due to body habitus MSK/EXT: Region where patient describes having pain is located in the 7th-8th intercostal region and somewhat on the inside of that area where he develops discomfort but there is no pain today, bilateral lower extremity edema present (trace) SKIN: No jaundice NEURO:  Alert & Oriented x 3, no focal deficits   REVIEW OF DATA  I reviewed the following data at the time of this encounter:  GI Procedures and Studies  September 2020 EGD - No gross lesions in esophagus proximally. - LA Grade B esophagitis distally. - No varices noted. - Small hiatal hernia. - Congested, erythematous and texture changed mucosa in the stomach - appearance suggestive of portal gastropathy, but due to history of recent HP treatment could be related. Biopsied. - No gross lesions in the duodenal bulb, in the first portion of the duodenum and in the second portion of the duodenum. Biopsied. Diagnosis 1. Surgical [P], random gastric sites - MILD CHRONIC GASTRITIS WITHOUT ACTIVITY - NO INTESTINAL METAPLASIA IDENTIFIED - SEE COMMENT 2. Surgical [P], duodenum - BENIGN DUODENAL MUCOSA - NO ACUTE INFLAMMATION, VILLOUS BLUNTING OR INCREASED INTRAEPITHELIAL LYMPHOCYTES Negative for H. pylori based on immunohistochemical stain  Laboratory Studies  Reviewed those in epic  Imaging Studies  October 2020 CT abdomen pelvis with contrast IMPRESSION: 1. No acute process in the abdomen or pelvis. 2. Hepatic steatosis and marked hepatomegaly. 3. Prostatomegaly. 4. Aortic Atherosclerosis (ICD10-I70.0).   ASSESSMENT  Mr. Duddy is a 53 y.o. male with a pmh significant for diabetes, hypertension, asthma, arthritis, obesity, likely fatty liver disease, esophagitis.  The patient is seen today for evaluation and management of:  1. LUQ pain   2. Esophagitis   3. Abnormal LFTs   4.  Musculoskeletal pain   5. Other chronic gastritis without hemorrhage   6. Screening for viral disease    The patient is clinically and hemodynamically stable.  From discussion with the patient, it  seems that his discomfort is more likely to be musculoskeletal related rather than GI related.  With that being said he did have recent esophagitis and gastritis for which he is on PPI therapy and needs a follow-up to ensure healing of esophagitis and ensure there is no underlying Barrett's.  We will move forward with that in the coming weeks.  He may benefit in the future from being evaluated by pain clinic or potentially will ask one of my partners who may offer intercostal nerve injections to see if this may be helpful for him should he have more significant issues.  Thus him to be mindful that 2022 he will be due for a repeat Cologuard with his primary care provider or a colonoscopy can be performed at that time.  We discussed that precancerous polyps may not be found with Cologuard testing.  Patient has persistent abnormal LFTs and will need to be monitored closely over the course of the coming months/years.  If his hepatic function panel continues to have biochemical abnormalities we will consider the role of a liver biopsy if serologies are unremarkable.  Laboratories today will be continued for further evaluation serologically.  The risks and benefits of endoscopic evaluation were discussed with the patient; these include but are not limited to the risk of perforation, infection, bleeding, missed lesions, lack of diagnosis, severe illness requiring hospitalization, as well as anesthesia and sedation related illnesses.  The patient is agreeable to proceed.  All patient questions were answered, to the best of my ability, and the patient agrees to the aforementioned plan of action with follow-up as indicated.   PLAN  Laboratories as outlined below EGD follow-up for esophagitis healing Continue PPI therapy  twice daily (patient will be able to afford once daily at this time due to insurance issues) Cologuard in 2022 versus colonoscopy for screening purposes If LFTs remain abnormal for greater than 1 year with no reasonable finding on laboratories/serologies will consider liver biopsy Patient's symptoms most likely musculoskeletal in origin may benefit from pain clinic evaluation for potential intercostal injection but will discuss with my partner whether he offers this as something to consider in future Follow-up with PCP about prostamegaly   Orders Placed This Encounter  Procedures  . SARS Coronavirus 2 ([facility] ONLY)  . Hepatic function panel  . Anti-smooth muscle antibody, IgG  . Mitochondrial antibodies  . Ceruloplasmin  . CK (Creatine Kinase)  . Hepatitis c antibody (reflex)  . Ambulatory referral to Gastroenterology    New Prescriptions   No medications on file   Modified Medications   No medications on file    Planned Follow Up No follow-ups on file.   Corliss Parish, MD [facility] Advanced [facility]oscopy Office # [phone]

## 2019-03-10 NOTE — Patient Instructions (Signed)
Your provider has requested that you go to the basement level for lab work before leaving today. Press "B" on the elevator. The lab is located at the first door on the left as you exit the elevator.  If you are age 51 or older, your body mass index should be between 23-30. Your Body mass index is 41.87 kg/m. If this is out of the aforementioned range listed, please consider follow up with your Primary Care Provider.  If you are age 73 or younger, your body mass index should be between 19-25. Your Body mass index is 41.87 kg/m. If this is out of the aformentioned range listed, please consider follow up with your Primary Care Provider.     You have been scheduled for an endoscopy. Please follow written instructions given to you at your visit today. If you use inhalers (even only as needed), please bring them with you on the day of your procedure.   Thank you for choosing me and [facility].  Dr. Rush Landmark

## 2019-03-10 NOTE — Progress Notes (Signed)
Hep c

## 2019-03-11 DIAGNOSIS — K2970 Gastritis, unspecified, without bleeding: Secondary | ICD-10-CM | POA: Insufficient documentation

## 2019-03-11 DIAGNOSIS — K209 Esophagitis, unspecified without bleeding: Secondary | ICD-10-CM | POA: Insufficient documentation

## 2019-03-11 DIAGNOSIS — M7918 Myalgia, other site: Secondary | ICD-10-CM | POA: Insufficient documentation

## 2019-03-11 DIAGNOSIS — Z1159 Encounter for screening for other viral diseases: Secondary | ICD-10-CM | POA: Insufficient documentation

## 2019-03-11 DIAGNOSIS — R1012 Left upper quadrant pain: Secondary | ICD-10-CM | POA: Insufficient documentation

## 2019-03-11 LAB — HEPATITIS C ANTIBODY (REFLEX): HCV Ab: <0.1 s/co ratio (ref 0.0–0.9)

## 2019-03-11 LAB — HCV COMMENT:

## 2019-03-12 LAB — CERULOPLASMIN: Ceruloplasmin: 27 mg/dL (ref 18–36)

## 2019-03-12 LAB — MITOCHONDRIAL ANTIBODIES: Mitochondrial M2 Ab, IgG: <=20 U

## 2019-03-12 LAB — ANTI-SMOOTH MUSCLE ANTIBODY, IGG: Actin (Smooth Muscle) Antibody (IGG): <20 U (ref <20)

## 2019-03-22 ENCOUNTER — Encounter: Payer: Self-pay | Admitting: Gastroenterology

## 2019-03-22 ENCOUNTER — Ambulatory Visit (INDEPENDENT_AMBULATORY_CARE_PROVIDER_SITE_OTHER): Payer: PRIVATE HEALTH INSURANCE

## 2019-03-22 ENCOUNTER — Other Ambulatory Visit: Payer: Self-pay | Admitting: Gastroenterology

## 2019-03-22 DIAGNOSIS — Z1159 Encounter for screening for other viral diseases: Secondary | ICD-10-CM

## 2019-03-23 LAB — SARS CORONAVIRUS 2 (TAT 6-24 HRS): SARS Coronavirus 2: NEGATIVE

## 2019-03-24 ENCOUNTER — Other Ambulatory Visit: Payer: Self-pay

## 2019-03-24 ENCOUNTER — Ambulatory Visit (AMBULATORY_SURGERY_CENTER): Payer: PRIVATE HEALTH INSURANCE | Admitting: Gastroenterology

## 2019-03-24 ENCOUNTER — Encounter: Payer: Self-pay | Admitting: Gastroenterology

## 2019-03-24 VITALS — BP 158/83 | HR 90 | Resp 17

## 2019-03-24 DIAGNOSIS — K2950 Unspecified chronic gastritis without bleeding: Secondary | ICD-10-CM | POA: Diagnosis not present

## 2019-03-24 DIAGNOSIS — K317 Polyp of stomach and duodenum: Secondary | ICD-10-CM | POA: Diagnosis present

## 2019-03-24 DIAGNOSIS — R1012 Left upper quadrant pain: Secondary | ICD-10-CM

## 2019-03-24 MED ORDER — SODIUM CHLORIDE 0.9 % IV SOLN: 500.0000 mL | Freq: Once | INTRAVENOUS | Status: DC

## 2019-03-24 NOTE — Progress Notes (Signed)
Report given to PACU, vss 

## 2019-03-24 NOTE — Progress Notes (Signed)
Called to room to assist during endoscopic procedure.  Patient ID and intended procedure confirmed with present staff. Received instructions for my participation in the procedure from the performing physician.  

## 2019-03-24 NOTE — Patient Instructions (Addendum)
YOU HAD AN ENDOSCOPIC PROCEDURE TODAY AT [facility]:   Refer to the procedure report that was given to you for any specific questions about what was found during the examination.  If the procedure report does not answer your questions, please call your gastroenterologist to clarify.  If you requested that your care partner not be given the details of your procedure findings, then the procedure report has been included in a sealed envelope for you to review at your convenience later.  YOU SHOULD EXPECT: Some feelings of bloating in the abdomen. Passage of more gas than usual.  Walking can help get rid of the air that was put into your GI tract during the procedure and reduce the bloating. If you had a lower endoscopy (such as a colonoscopy or flexible sigmoidoscopy) you may notice spotting of blood in your stool or on the toilet paper. If you underwent a bowel prep for your procedure, you may not have a normal bowel movement for a few days.  Please Note:  You might notice some irritation and congestion in your nose or some drainage.  This is from the oxygen used during your procedure.  There is no need for concern and it should clear up in a day or so.  SYMPTOMS TO REPORT IMMEDIATELY:    Following upper endoscopy (EGD)  Vomiting of blood or coffee ground material  New chest pain or pain under the shoulder blades  Painful or persistently difficult swallowing  New shortness of breath  Fever of 100F or higher  Black, tarry-looking stools  For urgent or emergent issues, a gastroenterologist can be reached at any hour by calling [phone].   DIET:  Follow a Full Liquid diet today, but then you may proceed to a soft diet tomorrow (Friday) and advance the next day (Saturday) into the weekend to a regular diet as tolerated.  Drink plenty of fluids but you should avoid alcoholic beverages for 24 hours.  MEDICATIONS: Increase Omeprazole to 40 mg by mouth twice daily for the next 1  month. Please go ahead and take it twice daily with what you have at home while awaiting the new prescription to come in. After 1 month, you may decrease back to once daily. Dr. Rush Landmark will have his office reach out to the pharmacy since you have had issues previously with insurance to ensure it goes through or any issues are taken care of so you can get your medication.   Please see handouts given to you by your recovery nurse.  "Clip Card" given to patient.  ACTIVITY:  You should plan to take it easy for the rest of today and you should NOT DRIVE or use heavy machinery until tomorrow (because of the sedation medicines used during the test).    FOLLOW UP: Our staff will call the number listed on your records 48-72 hours following your procedure to check on you and address any questions or concerns that you may have regarding the information given to you following your procedure. If we do not reach you, we will leave a message.  We will attempt to reach you two times.  During this call, we will ask if you have developed any symptoms of COVID 19. If you develop any symptoms (ie: fever, flu-like symptoms, shortness of breath, cough etc.) before then, please call [phone].  If you test positive for Covid 19 in the 2 weeks post procedure, please call and report this information to [place].    If  any biopsies were taken you will be contacted by phone or by letter within the next 1-3 weeks.  Please call us at [phone] if you have not heard about the biopsies in 3 weeks.   Thank you for allowing [place] to provide for your healthcare needs today.   SIGNATURES/CONFIDENTIALITY: You and/or your care partner have signed paperwork which will be entered into your electronic medical record.  These signatures attest to the fact that that the information above on your After Visit Summary has been reviewed and is understood.  Full responsibility of the confidentiality of this discharge information lies with you  and/or your care-partner.

## 2019-03-24 NOTE — Op Note (Signed)
[facility] Patient Name: Scott Buchanan Procedure Date: 03/24/2019 2:17 PM MRN: [id number] Endoscopist: Justice Britain , MD Age: 53 Referring MD:  Date of Birth: August 14, 1965 Gender: Male Account #: [id number] Procedure:                Upper GI endoscopy Indications:              Esophagitis, Follow-up of esophagitis Medicines:                Monitored Anesthesia Care Procedure:                Pre-Anesthesia Assessment:                           - Prior to the procedure, a History and Physical                            was performed, and patient medications and                            allergies were reviewed. The patient's tolerance of                            previous anesthesia was also reviewed. The risks                            and benefits of the procedure and the sedation                            options and risks were discussed with the patient.                            All questions were answered, and informed consent                            was obtained. Prior Anticoagulants: The patient has                            taken no previous anticoagulant or antiplatelet                            agents. ASA Grade Assessment: III - A patient with                            severe systemic disease. After reviewing the risks                            and benefits, the patient was deemed in                            satisfactory condition to undergo the procedure.                           After obtaining informed consent, the endoscope was  passed under direct vision. Throughout the                            procedure, the patient's blood pressure, pulse, and                            oxygen saturations were monitored continuously. The                            Endoscope was introduced through the mouth, and                            advanced to the second part of duodenum. The upper                            GI endoscopy  was accomplished without difficulty.                            The patient tolerated the procedure. Scope In: Scope Out: Findings:                 No gross lesions were noted in the entire esophagus.                           The Z-line was irregular and was found 43 cm from                            the incisors.                           A single 9 mm semi-sessile polyp with no bleeding                            and stigmata of recent bleeding was found on the                            greater curvature of the gastric body. Preparations                            were made for mucosal resection. Saline was                            injected to raise the lesion. Snare mucosal                            resection was performed. Resection and retrieval                            were complete. To prevent bleeding after mucosal                            resection, two hemostatic clips were successfully  placed (MR conditional). There was no bleeding                            during, or at the end, of the procedure.                           A single 12 mm semi-sessile polyp with no bleeding                            and no stigmata of recent bleeding was found in the                            gastric antrum. Preparations were made for mucosal                            resection. Saline was injected to raise the lesion.                            Snare mucosal resection was performed. Resection                            and retrieval were complete. To prevent bleeding                            after mucosal resection, two hemostatic clips were                            successfully placed (MR conditional). There was no                            bleeding during, or at the end, of the procedure.                           Localized moderately erythematous mucosa without                            bleeding was found in the gastric fundus and in the                             gastric body. Biopsies were taken with a cold                            forceps for histology.                           No other gross lesions were noted in the entire                            examined stomach.                           No gross lesions were noted in the duodenal bulb,  in the first portion of the duodenum and in the                            second portion of the duodenum. Complications:            No immediate complications. Estimated Blood Loss:     Estimated blood loss was minimal. Impression:               - No gross lesions in esophagus.                           - Z-line irregular, 43 cm from the incisors.                           - A single gastric polyp. Resected and retrieved.                            Clips (MR conditional) were placed.                           - A single gastric polyp. Resected and retrieved.                            Clips (MR conditional) were placed.                           - Erythematous mucosa in the gastric fundus and                            gastric body. Biopsied.                           - No gross lesions in the duodenal bulb, in the                            first portion of the duodenum and in the second                            portion of the duodenum. Recommendation:           - The patient will be observed post-procedure,                            until all discharge criteria are met.                           - Discharge patient to home.                           - Patient has a contact number available for                            emergencies. The signs and symptoms of potential  delayed complications were discussed with the                            patient. Return to normal activities tomorrow.                            Written discharge instructions were provided to the                            patient.                            - Full liquid diet today, then soft diet tomorrow                            and advance the next day into the weekend.                           - Monitor for signs/symptoms of bleeding,                            perforation, and infection. If issues please call                            our number to get further assistance as needed.                           - Increase Omeprazole to 40 mg twice daily for next                            70-month(we will have our staff reach out to the                            pharmacy since you have had issues previously with                            insurance) to ensure it goes through or any issues                            are taken care of to obtain. Please go ahead and                            take twice daily of what you have at home while                            awaiting the medication prescription. After 1 month                            you may decrease back to once daily.                           - Observe patient's clinical course.                           -  The findings and recommendations were discussed                            with the patient. Justice Britain, MD 03/24/2019 3:11:35 PM

## 2019-03-28 ENCOUNTER — Telehealth: Payer: Self-pay

## 2019-03-28 NOTE — Telephone Encounter (Signed)
  Follow up Call-  Call back number 03/24/2019 12/30/2018  Post procedure Call Back phone  # [phone]  Permission to leave phone message Yes Yes     Patient questions:  Do you have a fever, pain , or abdominal swelling? No. Pain Score  0 *  Have you tolerated food without any problems? Yes.    Have you been able to return to your normal activities? Yes.    Do you have any questions about your discharge instructions: Diet   No. Medications  No. Follow up visit  No.  Do you have questions or concerns about your Care? No.  Actions: * If pain score is 4 or above: No action needed, pain <4.  1. Have you developed a fever since your procedure? no  2.   Have you had an respiratory symptoms (SOB or cough) since your procedure? no  3.   Have you tested positive for COVID 19 since your procedure no  4.   Have you had any family members/close contacts diagnosed with the COVID 19 since your procedure?  no   If yes to any of these questions please route to Joylene John, RN and Alphonsa Gin, Therapist, sports.

## 2019-03-31 ENCOUNTER — Encounter: Payer: Self-pay | Admitting: Gastroenterology

## 2019-04-01 ENCOUNTER — Telehealth: Payer: Self-pay

## 2019-04-01 ENCOUNTER — Other Ambulatory Visit: Payer: Self-pay

## 2019-04-01 DIAGNOSIS — Z20822 Contact with and (suspected) exposure to covid-19: Secondary | ICD-10-CM

## 2019-04-01 NOTE — Telephone Encounter (Signed)
-----   Message from [name]., MD sent at 03/31/2019 11:35 PM EST ----- Regarding: Follow-up Scott Buchanan,Patient's endoscopy results will be sent by [name] nurse.Patient needs a follow-up in clinic in 2 to 3 months with me or [name].Thanks.GM

## 2019-04-01 NOTE — Telephone Encounter (Signed)
Recall in epic for the pt to have office visit in 2-3 months

## 2019-04-03 LAB — NOVEL CORONAVIRUS, NAA: SARS-CoV-2, NAA: NOT DETECTED

## 2019-04-28 ENCOUNTER — Telehealth: Payer: Self-pay | Admitting: Gastroenterology

## 2019-04-28 NOTE — Telephone Encounter (Signed)
Covermymeds-   Additional Information Required This medication or product is on your plan's list of covered drugs. Prior authorization is not required at this time. If your pharmacy has questions regarding the processing of your prescription, please have them call the [facility] pharmacy help desk at [phone]. **Please note: Formulary lowering, tiering exception, cost reduction and/or pre-benefit determination review (including prospective Medicare hospice reviews) requests cannot be requested using this method of submission. Please contact us at [phone] instead.  I have called pharmacy to inform them. I have also called pt and made him aware.

## 2019-04-28 NOTE — Telephone Encounter (Signed)
Pt stated that insurance requires a PA for omeprazole due to dosage change.  [phone]

## 2019-05-06 ENCOUNTER — Other Ambulatory Visit: Payer: Self-pay | Admitting: Gastroenterology

## 2019-05-06 NOTE — Telephone Encounter (Signed)
Patient called to follow up on request and states that he contacted the pharmacy again and they keep telling him the medications needs a PA. Upon reading prior notes I advised the patient to give the pharmacy the number to [facility] for them to verify.

## 2019-05-10 ENCOUNTER — Other Ambulatory Visit: Payer: Self-pay

## 2019-05-10 ENCOUNTER — Encounter: Payer: Self-pay | Admitting: Family Medicine

## 2019-05-10 ENCOUNTER — Ambulatory Visit (INDEPENDENT_AMBULATORY_CARE_PROVIDER_SITE_OTHER): Payer: PRIVATE HEALTH INSURANCE | Admitting: Family Medicine

## 2019-05-10 VITALS — BP 150/96 | HR 102 | Temp 98.1°F | Resp 16 | Ht 76.0 in | Wt 336.0 lb

## 2019-05-10 DIAGNOSIS — E119 Type 2 diabetes mellitus without complications: Secondary | ICD-10-CM

## 2019-05-10 DIAGNOSIS — G629 Polyneuropathy, unspecified: Secondary | ICD-10-CM | POA: Diagnosis not present

## 2019-05-10 DIAGNOSIS — N10 Acute tubulo-interstitial nephritis: Secondary | ICD-10-CM

## 2019-05-10 DIAGNOSIS — I10 Essential (primary) hypertension: Secondary | ICD-10-CM | POA: Diagnosis not present

## 2019-05-10 DIAGNOSIS — N401 Enlarged prostate with lower urinary tract symptoms: Secondary | ICD-10-CM | POA: Diagnosis not present

## 2019-05-10 DIAGNOSIS — M25561 Pain in right knee: Secondary | ICD-10-CM

## 2019-05-10 DIAGNOSIS — G8929 Other chronic pain: Secondary | ICD-10-CM

## 2019-05-10 DIAGNOSIS — E669 Obesity, unspecified: Secondary | ICD-10-CM | POA: Diagnosis not present

## 2019-05-10 DIAGNOSIS — N3943 Post-void dribbling: Secondary | ICD-10-CM

## 2019-05-10 DIAGNOSIS — E1169 Type 2 diabetes mellitus with other specified complication: Secondary | ICD-10-CM

## 2019-05-10 LAB — POCT URINALYSIS DIPSTICK
Bilirubin, UA: NEGATIVE
Glucose, UA: POSITIVE — AB
Ketones, UA: NEGATIVE
Nitrite, UA: POSITIVE
Protein, UA: POSITIVE — AB
Spec Grav, UA: 1.02 (ref 1.010–1.025)
Urobilinogen, UA: 0.2 E.U./dL
pH, UA: 5.5 (ref 5.0–8.0)

## 2019-05-10 LAB — POCT GLYCOSYLATED HEMOGLOBIN (HGB A1C): Hemoglobin A1C: 12 % — AB (ref 4.0–5.6)

## 2019-05-10 MED ORDER — TAMSULOSIN HCL 0.4 MG PO CAPS: 0.4000 mg | ORAL_CAPSULE | Freq: Every day | ORAL | 3 refills | Status: DC

## 2019-05-10 MED ORDER — GABAPENTIN 300 MG PO CAPS: 300.0000 mg | ORAL_CAPSULE | Freq: Every day | ORAL | 1 refills | Status: DC

## 2019-05-10 MED ORDER — AMOXICILLIN-POT CLAVULANATE 875-125 MG PO TABS: 1.0000 | ORAL_TABLET | Freq: Two times a day (BID) | ORAL | 0 refills | Status: AC

## 2019-05-10 MED ORDER — MELOXICAM 7.5 MG PO TABS: 7.5000 mg | ORAL_TABLET | Freq: Every day | ORAL | 1 refills | Status: DC

## 2019-05-10 MED ORDER — TRULICITY 0.75 MG/0.5ML ~~LOC~~ SOAJ: 0.7500 mg | SUBCUTANEOUS | 12 refills | Status: DC

## 2019-05-10 NOTE — Patient Instructions (Addendum)
Neuropathic Pain Neuropathic pain is pain caused by damage to the nerves that are responsible for certain sensations in your body (sensory nerves). The pain can be caused by:  Damage to the sensory nerves that send signals to your spinal cord and brain (peripheral nervous system).  Damage to the sensory nerves in your brain or spinal cord (central nervous system). Neuropathic pain can make you more sensitive to pain. Even a minor sensation can feel very painful. This is usually a long-term condition that can be difficult to treat. The type of pain differs from person to person. It may:  Start suddenly (acute), or it may develop slowly and last for a long time (chronic).  Come and go as damaged nerves heal, or it may stay at the same level for years.  Cause emotional distress, loss of sleep, and a lower quality of life. What are the causes? The most common cause of this condition is diabetes. Many other diseases and conditions can also cause neuropathic pain. Causes of neuropathic pain can be classified as:  Toxic. This is caused by medicines and chemicals. The most common cause of toxic neuropathic pain is damage from cancer treatments (chemotherapy).  Metabolic. This can be caused by: ? Diabetes. This is the most common disease that damages the nerves. ? Lack of vitamin B from long-term alcohol abuse.  Traumatic. Any injury that cuts, crushes, or stretches a nerve can cause damage and pain. A common example is feeling pain after losing an arm or leg (phantom limb pain).  Compression-related. If a sensory nerve gets trapped or compressed for a long period of time, the blood supply to the nerve can be cut off.  Vascular. Many blood vessel diseases can cause neuropathic pain by decreasing blood supply and oxygen to nerves.  Autoimmune. This type of pain results from diseases in which the body's defense system (immune system) mistakenly attacks sensory nerves. Examples of autoimmune diseases  that can cause neuropathic pain include lupus and multiple sclerosis.  Infectious. Many types of viral infections can damage sensory nerves and cause pain. Shingles infection is a common cause of this type of pain.  Inherited. Neuropathic pain can be a symptom of many diseases that are passed down through families (genetic). What increases the risk? You are more likely to develop this condition if:  You have diabetes.  You smoke.  You drink too much alcohol.  You are taking certain medicines, including medicines that kill cancer cells (chemotherapy) or that treat immune system disorders. What are the signs or symptoms? The main symptom is pain. Neuropathic pain is often described as:  Burning.  Shock-like.  Stinging.  Hot or cold.  Itching. How is this diagnosed? No single test can diagnose neuropathic pain. It is diagnosed based on:  Physical exam and your symptoms. Your health care provider will ask you about your pain. You may be asked to use a pain scale to describe how bad your pain is.  Tests. These may be done to see if you have a high sensitivity to pain and to help find the cause and location of any sensory nerve damage. They include: ? Nerve conduction studies to test how well nerve signals travel through your sensory nerves (electrodiagnostic testing). ? Stimulating your sensory nerves through electrodes on your skin and measuring the response in your spinal cord and brain (somatosensory evoked potential).  Imaging studies, such as: ? X-rays. ? CT scan. ? MRI. How is this treated? Treatment for neuropathic pain may change   over time. You may need to try different treatment options or a combination of treatments. Some options include:  Treating the underlying cause of the neuropathy, such as diabetes, kidney disease, or vitamin deficiencies.  Stopping medicines that can cause neuropathy, such as chemotherapy.  Medicine to relieve pain. Medicines may  include: ? Prescription or over-the-counter pain medicine. ? Anti-seizure medicine. ? Antidepressant medicines. ? Pain-relieving patches that are applied to painful areas of skin. ? A medicine to numb the area (local anesthetic), which can be injected as a nerve block.  Transcutaneous nerve stimulation. This uses electrical currents to block painful nerve signals. The treatment is painless.  Alternative treatments, such as: ? Acupuncture. ? Meditation. ? Massage. ? Physical therapy. ? Pain management programs. ? Counseling. Follow these instructions at home: Medicines   Take over-the-counter and prescription medicines only as told by your health care provider.  Do not drive or use heavy machinery while taking prescription pain medicine.  If you are taking prescription pain medicine, take actions to prevent or treat constipation. Your health care provider may recommend that you: ? Drink enough fluid to keep your urine pale yellow. ? Eat foods that are high in fiber, such as fresh fruits and vegetables, whole grains, and beans. ? Limit foods that are high in fat and processed sugars, such as fried or sweet foods. ? Take an over-the-counter or prescription medicine for constipation. Lifestyle   Have a good support system at home.  Consider joining a chronic pain support group.  Do not use any products that contain nicotine or tobacco, such as cigarettes and e-cigarettes. If you need help quitting, ask your health care provider.  Do not drink alcohol. General instructions  Learn as much as you can about your condition.  Work closely with all your health care providers to find the treatment plan that works best for you.  Ask your health care provider what activities are safe for you.  Keep all follow-up visits as told by your health care provider. This is important. Contact a health care provider if:  Your pain treatments are not working.  You are having side effects  from your medicines.  You are struggling with tiredness (fatigue), mood changes, depression, or anxiety. Summary  Neuropathic pain is pain caused by damage to the nerves that are responsible for certain sensations in your body (sensory nerves).  Neuropathic pain may come and go as damaged nerves heal, or it may stay at the same level for years.  Neuropathic pain is usually a long-term condition that can be difficult to treat. Consider joining a chronic pain support group. This information is not intended to replace advice given to you by your health care provider. Make sure you discuss any questions you have with your health care provider. Document Revised: 07/29/2018 Document Reviewed: 04/24/2017 Elsevier Patient Education  2020 [employer]  Benign Prostatic Hyperplasia  Benign prostatic hyperplasia (BPH) is an enlarged prostate gland that is caused by the normal aging process and not by cancer. The prostate is a walnut-sized gland that is involved in the production of semen. It is located in front of the rectum and below the bladder. The bladder stores urine and the urethra is the tube that carries the urine out of the body. The prostate may get bigger as a man gets older. An enlarged prostate can press on the urethra. This can make it harder to pass urine. The build-up of urine in the bladder can cause infection. Back pressure and infection  may progress to bladder damage and kidney (renal) failure. What are the causes? This condition is part of a normal aging process. However, not all men develop problems from this condition. If the prostate enlarges away from the urethra, urine flow will not be blocked. If it enlarges toward the urethra and compresses it, there will be problems passing urine. What increases the risk? This condition is more likely to develop in men over the age of 34 years. What are the signs or symptoms? Symptoms of this condition include:  Getting up often during the  night to urinate.  Needing to urinate frequently during the day.  Difficulty starting urine flow.  Decrease in size and strength of your urine stream.  Leaking (dribbling) after urinating.  Inability to pass urine. This needs immediate treatment.  Inability to completely empty your bladder.  Pain when you pass urine. This is more common if there is also an infection.  Urinary tract infection (UTI). How is this diagnosed? This condition is diagnosed based on your medical history, a physical exam, and your symptoms. Tests will also be done, such as:  A post-void bladder scan. This measures any amount of urine that may remain in your bladder after you finish urinating.  A digital rectal exam. In a rectal exam, your health care provider checks your prostate by putting a lubricated, gloved finger into your rectum to feel the back of your prostate gland. This exam detects the size of your gland and any abnormal lumps or growths.  An exam of your urine (urinalysis).  A prostate specific antigen (PSA) screening. This is a blood test used to screen for prostate cancer.  An ultrasound. This test uses sound waves to electronically produce a picture of your prostate gland. Your health care provider may refer you to a specialist in kidney and prostate diseases (urologist). How is this treated? Once symptoms begin, your health care provider will monitor your condition (active surveillance or watchful waiting). Treatment for this condition will depend on the severity of your condition. Treatment may include:  Observation and yearly exams. This may be the only treatment needed if your condition and symptoms are mild.  Medicines to relieve your symptoms, including: ? Medicines to shrink the prostate. ? Medicines to relax the muscle of the prostate.  Surgery in severe cases. Surgery may include: ? Prostatectomy. In this procedure, the prostate tissue is removed completely through an open  incision or with a laparoscope or robotics. ? Transurethral resection of the prostate (TURP). In this procedure, a tool is inserted through the opening at the tip of the penis (urethra). It is used to cut away tissue of the inner core of the prostate. The pieces are removed through the same opening of the penis. This removes the blockage. ? Transurethral incision (TUIP). In this procedure, small cuts are made in the prostate. This lessens the prostate's pressure on the urethra. ? Transurethral microwave thermotherapy (TUMT). This procedure uses microwaves to create heat. The heat destroys and removes a small amount of prostate tissue. ? Transurethral needle ablation (TUNA). This procedure uses radio frequencies to destroy and remove a small amount of prostate tissue. ? Interstitial laser coagulation ([place]). This procedure uses a laser to destroy and remove a small amount of prostate tissue. ? Transurethral electrovaporization (TUVP). This procedure uses electrodes to destroy and remove a small amount of prostate tissue. ? Prostatic urethral lift. This procedure inserts an implant to push the lobes of the prostate away from the urethra. Follow  these instructions at home:  Take over-the-counter and prescription medicines only as told by your health care provider.  Monitor your symptoms for any changes. Contact your health care provider with any changes.  Avoid drinking large amounts of liquid before going to bed or out in public.  Avoid or reduce how much caffeine or alcohol you drink.  Give yourself time when you urinate.  Keep all follow-up visits as told by your health care provider. This is important. Contact a health care provider if:  You have unexplained back pain.  Your symptoms do not get better with treatment.  You develop side effects from the medicine you are taking.  Your urine becomes very dark or has a bad smell.  Your lower abdomen becomes distended and you have trouble  passing your urine. Get help right away if:  You have a fever or chills.  You suddenly cannot urinate.  You feel lightheaded, or very dizzy, or you faint.  There are large amounts of blood or clots in the urine.  Your urinary problems become hard to manage.  You develop moderate to severe low back or flank pain. The flank is the side of your body between the ribs and the hip. These symptoms may represent a serious problem that is an emergency. Do not wait to see if the symptoms will go away. Get medical help right away. Call your local emergency services (911 in the [place] Do not drive yourself to the hospital. Summary  Benign prostatic hyperplasia (BPH) is an enlarged prostate that is caused by the normal aging process and not by cancer.  An enlarged prostate can press on the urethra. This can make it hard to pass urine.  This condition is part of a normal aging process and is more likely to develop in men over the age of 50 years.  Get help right away if you suddenly cannot urinate. This information is not intended to replace advice given to you by your health care provider. Make sure you discuss any questions you have with your health care provider. Document Revised: 03/02/2018 Document Reviewed: 05/12/2016 Elsevier Patient Education  2020 [employer].

## 2019-05-10 NOTE — Progress Notes (Signed)
[facility]   Subjective:  Patient ID: Scott Buchanan, male    DOB: 1965/08/11  Age: 54 y.o. MRN: [id number]  CC:  Chief Complaint  Patient presents with  . Hypertension  . Hyperlipidemia  . Diabetes    complaining of DRY MOUTH   . Medication Refill    asking for refill on meloxicam 7.5mg    . Urinary Frequency    wakes up at night. States he has enlarged prostate from last CT  . Leg Problem    Pt states "Excessive leg cramps at night"   . Headache    when excess activity   . Wheezing    pt states wheezing at night when laying down   Scott Buchanan, a very pleasant 54 year old male with a medical history of type 2 diabetes mellitus, hypertension, hyperlipidemia, mild intermittent asthma, GERD, and history of osteoarthritis presents with multiple complaints. He is complaining of urinary frequency and urgency. Patient was evaluated by gastroenterologist several months ago. He was found to have prostatomegaly on CT of abdomen and pelvis, which was an incidental finding. He endorses difficulty starting a urine stream and occasionally retains urine. He also reports some dribbling of urine.   Hypertension This is a chronic problem. The problem is controlled. Associated symptoms include headaches. Pertinent negatives include no blurred vision, chest pain, malaise/fatigue, palpitations or peripheral edema. Risk factors for coronary artery disease include obesity and diabetes mellitus. Compliance problems include exercise and diet.   Diabetes He presents for his follow-up diabetic visit. He has type 2 diabetes mellitus. His disease course has been fluctuating. Hypoglycemia symptoms include headaches. Pertinent negatives for hypoglycemia include no seizures. Associated symptoms include foot paresthesias and polydipsia. Pertinent negatives for diabetes include no blurred vision, no chest pain, no fatigue, no foot ulcerations, no polyphagia, no visual  change and no weight loss. Symptoms are stable. Diabetic complications include peripheral neuropathy. He is compliant with treatment most of the time. He is following a generally unhealthy diet. He has not had a previous visit with a dietitian. He rarely participates in exercise. He does not see a podiatrist.Eye exam is current.  Urinary Frequency  This is a new problem. The current episode started 1 to 4 weeks ago. The problem occurs intermittently. The patient is experiencing no pain. There has been no fever. He is not sexually active. Associated symptoms include frequency.  Headache  This is a new problem. The current episode started in the past 7 days. The problem occurs intermittently. The pain is located in the frontal region. The pain does not radiate. The pain quality is not similar to prior headaches. The quality of the pain is described as aching. The pain is at a severity of 4/10. The pain is mild. Associated symptoms include numbness. Pertinent negatives include no blurred vision, fever, insomnia, loss of balance, muscle aches, seizures, sore throat, swollen glands, tinnitus, visual change or weight loss. The symptoms are aggravated by activity. His past medical history is significant for hypertension.  Wheezing  This is a recurrent problem. The problem occurs intermittently. Associated symptoms include headaches. Pertinent negatives include no chest pain, fever, sore throat or swollen glands. The symptoms are aggravated by any activity. He has tried beta agonist inhalers for the symptoms. The treatment provided mild relief. His past medical history is significant for asthma.    Past Medical History:  Diagnosis Date  . Arthritis   . Asthma   . Diabetes mellitus without  complication (HCC)    type 2  . Hypertension   . Primary localized osteoarthritis of right knee 03/02/2018    Past Surgical History:  Procedure Laterality Date  . CYSTECTOMY     tailbone  . ETHMOIDECTOMY Bilateral  04/07/2017   Procedure: BILATERAL ETHMOIDECTOMY AND SPHENOIDECTOMY;  Surgeon: Newman Pies, MD;  Location: [facility];  Service: ENT;  Laterality: Bilateral;  . KNEE ARTHROSCOPY W/ MENISCAL REPAIR     right and left knee one year apart  . PARTIAL KNEE ARTHROPLASTY Right 03/02/2018   Procedure: UNICOMPARTMENTAL KNEE;  Surgeon: Teryl Lucy, MD;  Location: [facility];  Service: Orthopedics;  Laterality: Right;  with block  . SINUS ENDO WITH FUSION Bilateral 04/07/2017   Procedure: BILATERAL FRONTAL RECESS SINUS EXPLORATION WITH SINUS FUSION NAVIGATION;  Surgeon: Newman Pies, MD;  Location: [facility];  Service: ENT;  Laterality: Bilateral;  . SINUS EXPLORATION    . TONSILLECTOMY    . TURBINATE REDUCTION Bilateral 04/07/2017   Procedure: BILATERAL TURBINATE REDUCTION;  Surgeon: Newman Pies, MD;  Location: [facility];  Service: ENT;  Laterality: Bilateral;  . unicompartmental right knee      Family History  Problem Relation Age of Onset  . Hypertension Mother   . Colon cancer Neg Hx   . Colon polyps Neg Hx   . Stomach cancer Neg Hx   . Rectal cancer Neg Hx   . Esophageal cancer Neg Hx   . Inflammatory bowel disease Neg Hx   . Liver disease Neg Hx   . Pancreatic cancer Neg Hx     Social History   Socioeconomic History  . Marital status: Single    Spouse name: Not on file  . Number of children: 1  . Years of education: Not on file  . Highest education level: Not on file  Occupational History  . Occupation: maintance   Tobacco Use  . Smoking status: Former Smoker    Types: Cigarettes  . Smokeless tobacco: Never Used  . Tobacco comment: quit 10 years ago  Substance and Sexual Activity  . Alcohol use: No  . Drug use: No  . Sexual activity: Yes  Other Topics Concern  . Not on file  Social History Narrative  . Not on file   Social Determinants of Health   Financial Resource Strain:   . Difficulty of Paying Living Expenses: Not on file    Food Insecurity:   . Worried About Programme researcher, broadcasting/film/video in the Last Year: Not on file  . Ran Out of Food in the Last Year: Not on file  Transportation Needs:   . Lack of Transportation (Medical): Not on file  . Lack of Transportation (Non-Medical): Not on file  Physical Activity:   . Days of Exercise per Week: Not on file  . Minutes of Exercise per Session: Not on file  Stress:   . Feeling of Stress : Not on file  Social Connections:   . Frequency of Communication with Friends and Family: Not on file  . Frequency of Social Gatherings with Friends and Family: Not on file  . Attends Religious Services: Not on file  . Active Member of Clubs or Organizations: Not on file  . Attends Banker Meetings: Not on file  . Marital Status: Not on file  Intimate Partner Violence:   . Fear of Current or Ex-Partner: Not on file  . Emotionally Abused: Not on file  . Physically Abused: Not on file  .  Sexually Abused: Not on file    Outpatient Medications Prior to Visit  Medication Sig Dispense Refill  . albuterol (VENTOLIN HFA) 108 (90 Base) MCG/ACT inhaler Inhale 2 puffs into the lungs every 6 (six) hours as needed for wheezing or shortness of breath. 18 g 3  . amLODipine (NORVASC) 10 MG tablet Take 1 tablet by mouth once daily 90 tablet 0  . Ascorbic Acid (VITAMIN C) 100 MG tablet Take 100 mg by mouth daily.    Marland Kitchen aspirin EC 81 MG tablet Take 81 mg by mouth daily.    Marland Kitchen atorvastatin (LIPITOR) 20 MG tablet Take 1 tablet (20 mg total) by mouth daily. 90 tablet 3  . cholecalciferol (VITAMIN D) 1000 units tablet Take 1,000 Units by mouth daily.    . fluticasone (FLONASE) 50 MCG/ACT nasal spray Place 2 sprays into both nostrils daily.     . hydrochlorothiazide (HYDRODIURIL) 12.5 MG tablet Take 1 tablet (12.5 mg total) by mouth daily. 90 tablet 3  . levocetirizine (XYZAL) 5 MG tablet Take 1 tablet (5 mg total) by mouth every evening. 90 tablet 2  . metFORMIN (GLUCOPHAGE) 1000 MG tablet Take  1 tablet (1,000 mg total) by mouth 2 (two) times daily with a meal. 180 tablet 3  . montelukast (SINGULAIR) 10 MG tablet Take 1 tablet (10 mg total) by mouth at bedtime. 90 tablet 3  . omeprazole (PRILOSEC) 40 MG capsule Take 40 mg by mouth daily.    . Probiotic Product (PROBIOTIC ADVANCED PO) Take by mouth.    . meloxicam (MOBIC) 7.5 MG tablet Take 7.5 mg by mouth daily.     Facility-Administered Medications Prior to Visit  Medication Dose Route Frequency Provider Last Rate Last Admin  . 0.9 %  sodium chloride infusion  500 mL Intravenous Once Mansouraty, Telford Nab., MD        No Known Allergies  ROS Review of Systems  Constitutional: Negative.  Negative for fatigue, fever, malaise/fatigue and weight loss.  HENT: Negative.  Negative for sore throat and tinnitus.   Eyes: Negative.  Negative for blurred vision.  Respiratory: Positive for wheezing.   Cardiovascular: Negative.  Negative for chest pain, palpitations and leg swelling.  Gastrointestinal:       Indigestion Occasional diarrhea  Endocrine: Positive for polydipsia. Negative for polyphagia.  Genitourinary: Positive for frequency.  Musculoskeletal: Positive for arthralgias (bilateral knee pain).  Allergic/Immunologic: Positive for environmental allergies.  Neurological: Positive for numbness and headaches. Negative for seizures and loss of balance.  Hematological: Negative.   Psychiatric/Behavioral: Negative.  The patient does not have insomnia.       Objective:    Physical Exam  Constitutional: He is oriented to person, place, and time.  Morbid obesity  HENT:  Head: Normocephalic and atraumatic.  Eyes: Pupils are equal, round, and reactive to light.  Cardiovascular: Normal rate and regular rhythm.  Pulmonary/Chest: Effort normal. He has wheezes.  Abdominal: He exhibits distension. There is no abdominal tenderness.  Musculoskeletal:     Cervical back: Normal range of motion and neck supple.  Neurological: He is  alert and oriented to person, place, and time.  Skin: Skin is warm and dry.  Psychiatric: He has a normal mood and affect. His behavior is normal. Judgment and thought content normal.    BP (!) 150/96 (BP Location: Left Arm, Patient Position: Sitting, Cuff Size: Large) Comment: manually  Pulse (!) 102   Temp 98.1 F (36.7 C) (Oral)   Resp 16   Ht 6\' 4"  (  1.93 m)   Wt (!) 336 lb (152.4 kg)   SpO2 98%   BMI 40.90 kg/m  Wt Readings from Last 3 Encounters:  05/10/19 (!) 336 lb (152.4 kg)  03/10/19 (!) 344 lb (156 kg)  12/30/18 (!) 350 lb (158.8 kg)     Health Maintenance Due  Topic Date Due  . URINE MICROALBUMIN  02/06/2019    There are no preventive care reminders to display for this patient.  Lab Results  Component Value Date   TSH 2.430 11/09/2017   Lab Results  Component Value Date   WBC 14.4 (H) 03/03/2018   HGB 13.0 03/03/2018   HCT 40.0 03/03/2018   MCV 84.2 03/03/2018   PLT 353 03/03/2018   Lab Results  Component Value Date   NA 138 08/16/2018   K 4.1 08/16/2018   CO2 20 08/16/2018   GLUCOSE 189 (H) 08/16/2018   BUN 19 01/20/2019   CREATININE 1.09 01/20/2019   BILITOT 0.7 03/10/2019   ALKPHOS 59 03/10/2019   AST 44 (H) 03/10/2019   ALT 71 (H) 03/10/2019   PROT 7.4 03/10/2019   ALBUMIN 4.6 03/10/2019   CALCIUM 9.9 08/16/2018   ANIONGAP 10 03/03/2018   Lab Results  Component Value Date   CHOL 194 08/16/2018   Lab Results  Component Value Date   HDL 34 (L) 08/16/2018   Lab Results  Component Value Date   LDLCALC 103 (H) 08/16/2018   Lab Results  Component Value Date   TRIG 285 (H) 08/16/2018   Lab Results  Component Value Date   CHOLHDL 5.7 (H) 08/16/2018   Lab Results  Component Value Date   HGBA1C 8.2 (A) 11/22/2018      Assessment & Plan:   Problem List Items Addressed This Visit      Cardiovascular and Mediastinum   Essential hypertension - Primary   Relevant Orders   Urinalysis Dipstick     Endocrine   Diabetes  mellitus type 2 in obese (HCC)   Relevant Orders   Urinalysis Dipstick   HgB A1c     Other   Morbid obesity (HCC)    Other Visit Diagnoses    Benign prostatic hyperplasia with post-void dribbling       Relevant Medications   tamsulosin (FLOMAX) 0.4 MG CAPS capsule   Other Relevant Orders   Ambulatory referral to Urology   Neuropathy       Relevant Medications   gabapentin (NEURONTIN) 300 MG capsule   Other Relevant Orders   Basic Metabolic Panel   Vitamin B12   Chronic pain of right knee       Relevant Medications   meloxicam (MOBIC) 7.5 MG tablet   gabapentin (NEURONTIN) 300 MG capsule      Meds ordered this encounter  Medications  . tamsulosin (FLOMAX) 0.4 MG CAPS capsule    Sig: Take 1 capsule (0.4 mg total) by mouth daily.    Dispense:  30 capsule    Refill:  3    Order Specific Question:   Supervising Provider    Answer:   Quentin AngstJEGEDE, OLUGBEMIGA E [id number]]  . meloxicam (MOBIC) 7.5 MG tablet    Sig: Take 1 tablet (7.5 mg total) by mouth daily.    Dispense:  30 tablet    Refill:  1    Order Specific Question:   Supervising Provider    Answer:   Quentin AngstJEGEDE, OLUGBEMIGA E [id number]]  . gabapentin (NEURONTIN) 300 MG capsule    Sig: Take 1 capsule (300 mg  total) by mouth at bedtime.    Dispense:  30 capsule    Refill:  1    Order Specific Question:   Supervising Provider    Answer:   Quentin Angst [id number]   Essential hypertension Blood pressure is not controlled on current medication regimen. Patient has not been following a consistent diet or exercising. The patient is asked to make an attempt to improve diet and exercise patterns to aid in medical management of this problem.  - Urinalysis Dipstick   Diabetes mellitus type 2 in obese (HCC) Hemoglobin a1C has increased to 12.4. He says that he has been drinking multiple sodas per day and typically eats sandwiches daily. Discussed the importance of following a carbohydrate modified diet and exercising. Provided  written information.  Will start a trial of trulicity. Discussed weekly injections at length. Patient expressed understanding of giving himself weekly injections.  - Urinalysis Dipstick - HgB A1c - Dulaglutide (TRULICITY) 0.75 MG/0.5ML SOPN; Inject 0.75 mg into the skin once a week.  Dispense: 0.5 mL; Refill: 12  Benign prostatic hyperplasia with post-void dribbling Patient had an incidental finding of prostatomegaly on CT of abdomen and pelvis on 01/26/2019. Patient warrants a referral to urology for further workup and evaluation Will start a trial of flomax for urinary retention.  - tamsulosin (FLOMAX) 0.4 MG CAPS capsule; Take 1 capsule (0.4 mg total) by mouth daily.  Dispense: 30 capsule; Refill: 3 - Ambulatory referral to Urology  Neuropathy - Basic Metabolic Panel - Vitamin B12 - gabapentin (NEURONTIN) 300 MG capsule; Take 1 capsule (300 mg total) by mouth at bedtime.  Dispense: 30 capsule; Refill: 1  Chronic pain of right knee - meloxicam (MOBIC) 7.5 MG tablet; Take 1 tablet (7.5 mg total) by mouth daily.  Dispense: 30 tablet; Refill: 1  Morbid obesity (HCC) The patient is asked to make an attempt to improve diet and exercise patterns to aid in medical management of this problem.    Acute pyelonephritis Positive nitrites, urine leukocytes on urinalysis - Urine Culture - amoxicillin-clavulanate (AUGMENTIN) 875-125 MG tablet; Take 1 tablet by mouth 2 (two) times daily for 10 days.  Dispense: 20 tablet; Refill: 0    Follow-up: Return in about 1 month (around 06/10/2019).    Nolon Nations  APRN, MSN, FNP-C Patient Care [facility] [address] [place], [place] [address] [phone]

## 2019-05-11 ENCOUNTER — Telehealth: Payer: Self-pay

## 2019-05-11 LAB — BASIC METABOLIC PANEL
BUN/Creatinine Ratio: 20 (ref 9–20)
BUN: 16 mg/dL (ref 6–24)
CO2: 19 mmol/L — ABNORMAL LOW (ref 20–29)
Calcium: 10.2 mg/dL (ref 8.7–10.2)
Chloride: 98 mmol/L (ref 96–106)
Creatinine, Ser: 0.8 mg/dL (ref 0.76–1.27)
GFR calc Af Amer: 120 mL/min/{1.73_m2} (ref >59)
GFR calc non Af Amer: 103 mL/min/{1.73_m2} (ref >59)
Glucose: 282 mg/dL — ABNORMAL HIGH (ref 65–99)
Potassium: 4.1 mmol/L (ref 3.5–5.2)
Sodium: 136 mmol/L (ref 134–144)

## 2019-05-11 LAB — VITAMIN B12: Vitamin B-12: 535 pg/mL (ref 232–1245)

## 2019-05-11 NOTE — Telephone Encounter (Signed)
Called and spoke with patient, advised that blood sugar was elevated but all other labs were unremarkable. Reminded that it is very important to take medications as prescribed every day and to work on diet and exercise. Patient verbalized understanding. Thanks!

## 2019-05-11 NOTE — Telephone Encounter (Signed)
-----   Message from Massie Maroon, [place] sent at 05/11/2019  6:15 AM EST ----- Regarding: lab results Please inform patient that blood sugar was markedly elevated, which was expected in light of elevated hemoglobin a1c. All other labs unremarkable. Stress the importance of adhering to prescribed medication regimen, diet, and exercise plan in order to ensure positive outcomes.   Nolon Nations  APRN, MSN, FNP-C Patient Care [facility] [address] [place], [place] [address] [phone]

## 2019-05-12 ENCOUNTER — Telehealth: Payer: Self-pay

## 2019-05-12 LAB — URINE CULTURE

## 2019-05-12 NOTE — Telephone Encounter (Signed)
-----   Message from Massie Maroon, [place] sent at 05/12/2019 10:45 AM EST ----- Regarding: lab results Please inform patient that urinary tract infection was caused by E.coli in urine, which is sensitive to augmentin. Please complete medication. Increase water intake to 32 ounces per day. Follow up as scheduled   Nolon Nations  APRN, MSN, FNP-C Patient Care [facility] [address] [place], [place] [address] [phone]

## 2019-05-12 NOTE — Telephone Encounter (Signed)
Called and spoke with patient, advised that urinary tract infection is caused by E.coli in urine, asked that he continue abx for Augmentin that this should treat infection. Asked that he drink 32 oz of water per day and follow up as scheduled. Thanks!

## 2019-05-17 MED ORDER — OMEPRAZOLE 40 MG PO CPDR: 40.0000 mg | DELAYED_RELEASE_CAPSULE | Freq: Every day | ORAL | 4 refills | Status: DC

## 2019-05-17 NOTE — Telephone Encounter (Signed)
This encounter was routed to the wrong person on 05/06/2019. I spoke with pt today. He informed me that after speaking with the pharmacy , he ins will only cover once daily dosing. After review of Dr Meridee Score note, he wanted pt to go to once daily dosing after 1 month. I have sent rx for once daily to [facility]- pt was advised.

## 2019-05-31 ENCOUNTER — Other Ambulatory Visit: Payer: Self-pay | Admitting: Internal Medicine

## 2019-05-31 DIAGNOSIS — I10 Essential (primary) hypertension: Secondary | ICD-10-CM

## 2019-06-13 ENCOUNTER — Other Ambulatory Visit: Payer: Self-pay

## 2019-06-13 ENCOUNTER — Encounter: Payer: Self-pay | Admitting: Urology

## 2019-06-13 ENCOUNTER — Ambulatory Visit: Payer: PRIVATE HEALTH INSURANCE | Admitting: Urology

## 2019-06-13 VITALS — BP 140/80 | HR 78 | Ht 76.0 in | Wt 330.0 lb

## 2019-06-13 DIAGNOSIS — R8281 Pyuria: Secondary | ICD-10-CM | POA: Diagnosis not present

## 2019-06-13 DIAGNOSIS — N400 Enlarged prostate without lower urinary tract symptoms: Secondary | ICD-10-CM | POA: Diagnosis not present

## 2019-06-13 LAB — URINALYSIS, COMPLETE
Bilirubin, UA: NEGATIVE
Glucose, UA: NEGATIVE
Ketones, UA: NEGATIVE
Nitrite, UA: POSITIVE — AB
RBC, UA: NEGATIVE
Specific Gravity, UA: >1.03 — ABNORMAL HIGH (ref 1.005–1.030)
Urobilinogen, Ur: 0.2 mg/dL (ref 0.2–1.0)
pH, UA: 5.5 (ref 5.0–7.5)

## 2019-06-13 LAB — MICROSCOPIC EXAMINATION: RBC, Urine: NONE SEEN /hpf (ref 0–2)

## 2019-06-13 LAB — BLADDER SCAN AMB NON-IMAGING: Scan Result: 0

## 2019-06-13 MED ORDER — TAMSULOSIN HCL 0.4 MG PO CAPS: 0.8000 mg | ORAL_CAPSULE | Freq: Every day | ORAL | 1 refills | Status: DC

## 2019-06-13 NOTE — Progress Notes (Signed)
06/13/2019 1:37 PM   Scott Buchanan 02-Aug-1965 [id number]  Referring provider: Dorena Dew, [address],  [place] [address]  Chief Complaint  Patient presents with  . Benign Prostatic Hypertrophy    HPI: Scott Buchanan is a 54 y.o. male seen in consultation at the request of Cammie Sickle, FNP for evaluation of lower urinary tract symptoms.  He saw his PCP on 05/10/2019 with a 1-4-week history of intermittent urinary frequency and nocturia.  Today he tells me his symptoms have been present for approximately 6 months.  He has nocturia x3-4 and post void dribbling.  He does have urinary urgency but no incontinence.  Denies dysuria or gross hematuria.  Denies flank, abdominal or pelvic pain.  IPSS completed today was 17/35 with a QoL rated 5/6.  His most bothersome symptoms were urgency and nocturia.  He was given a trial of tamsulosin which she has been taking approximately 30 days and notes mild improvement in his symptoms.  No previous history of urologic problems or prior urologic evaluation.   PMH: Past Medical History:  Diagnosis Date  . Arthritis   . Asthma   . Diabetes mellitus without complication ([place])    type 2  . Hypertension   . Primary localized osteoarthritis of right knee 03/02/2018    Surgical History: Past Surgical History:  Procedure Laterality Date  . CYSTECTOMY     tailbone  . ETHMOIDECTOMY Bilateral 04/07/2017   Procedure: BILATERAL ETHMOIDECTOMY AND SPHENOIDECTOMY;  Surgeon: Leta Baptist, MD;  Location: [facility];  Service: ENT;  Laterality: Bilateral;  . KNEE ARTHROSCOPY W/ MENISCAL REPAIR     right and left knee one year apart  . PARTIAL KNEE ARTHROPLASTY Right 03/02/2018   Procedure: UNICOMPARTMENTAL KNEE;  Surgeon: Marchia Bond, MD;  Location: [facility];  Service: Orthopedics;  Laterality: Right;  with block  . SINUS ENDO WITH FUSION Bilateral 04/07/2017   Procedure: BILATERAL FRONTAL RECESS SINUS  EXPLORATION WITH SINUS FUSION NAVIGATION;  Surgeon: Leta Baptist, MD;  Location: [facility];  Service: ENT;  Laterality: Bilateral;  . SINUS EXPLORATION    . TONSILLECTOMY    . TURBINATE REDUCTION Bilateral 04/07/2017   Procedure: BILATERAL TURBINATE REDUCTION;  Surgeon: Leta Baptist, MD;  Location: [facility];  Service: ENT;  Laterality: Bilateral;  . unicompartmental right knee      Home Medications:  Allergies as of 06/13/2019   No Known Allergies     Medication List       Accurate as of June 13, 2019  1:37 PM. If you have any questions, ask your nurse or doctor.        albuterol 108 (90 Base) MCG/ACT inhaler Commonly known as: VENTOLIN HFA Inhale 2 puffs into the lungs every 6 (six) hours as needed for wheezing or shortness of breath.   amLODipine 10 MG tablet Commonly known as: NORVASC Take 1 tablet by mouth once daily   aspirin EC 81 MG tablet Take 81 mg by mouth daily.   atorvastatin 20 MG tablet Commonly known as: LIPITOR Take 1 tablet (20 mg total) by mouth daily.   cholecalciferol 1000 units tablet Commonly known as: VITAMIN D Take 1,000 Units by mouth daily.   Flonase 50 MCG/ACT nasal spray Generic drug: fluticasone Place 2 sprays into both nostrils daily.   gabapentin 300 MG capsule Commonly known as: NEURONTIN Take 1 capsule (300 mg total) by mouth at bedtime.   hydrochlorothiazide 12.5 MG tablet Commonly  known as: HYDRODIURIL Take 1 tablet (12.5 mg total) by mouth daily.   levocetirizine 5 MG tablet Commonly known as: XYZAL Take 1 tablet (5 mg total) by mouth every evening.   meloxicam 7.5 MG tablet Commonly known as: MOBIC Take 1 tablet (7.5 mg total) by mouth daily.   metFORMIN 1000 MG tablet Commonly known as: GLUCOPHAGE Take 1 tablet (1,000 mg total) by mouth 2 (two) times daily with a meal.   montelukast 10 MG tablet Commonly known as: SINGULAIR Take 1 tablet (10 mg total) by mouth at bedtime.   omeprazole  40 MG capsule Commonly known as: PRILOSEC Take 1 capsule (40 mg total) by mouth daily.   PROBIOTIC ADVANCED PO Take by mouth.   tamsulosin 0.4 MG Caps capsule Commonly known as: FLOMAX Take 1 capsule (0.4 mg total) by mouth daily.   Trulicity 0.75 MG/0.5ML Sopn Generic drug: Dulaglutide Inject 0.75 mg into the skin once a week.   vitamin C 100 MG tablet Take 100 mg by mouth daily.       Allergies: No Known Allergies  Family History: Family History  Problem Relation Age of Onset  . Hypertension Mother   . Colon cancer Neg Hx   . Colon polyps Neg Hx   . Stomach cancer Neg Hx   . Rectal cancer Neg Hx   . Esophageal cancer Neg Hx   . Inflammatory bowel disease Neg Hx   . Liver disease Neg Hx   . Pancreatic cancer Neg Hx     Social History:  reports that he has quit smoking. His smoking use included cigarettes. He has never used smokeless tobacco. He reports that he does not drink alcohol or use drugs.   Physical Exam: BP 140/80   Pulse 78   Ht 6\' 4"  (1.93 m)   Wt (!) 330 lb (149.7 kg)   BMI 40.17 kg/m   Constitutional:  Alert and oriented, No acute distress. HEENT: [place] AT, moist mucus membranes.  Trachea midline, no masses. Cardiovascular: No clubbing, cyanosis, or edema. Respiratory: Normal respiratory effort, no increased work of breathing. GI: Abdomen is soft, nontender, nondistended, no abdominal masses GU: Prostate 40 g, smooth without nodules Skin: No rashes, bruises or suspicious lesions. Neurologic: Grossly intact, no focal deficits, moving all 4 extremities. Psychiatric: Normal mood and affect.  Laboratory Data:  Urinalysis Dipstick nitrite positive, trace leukocytes Microscopy 11-30 WBC, many bacteria  Pertinent Imaging: CT scan October 2020 was reviewed.  Estimated prostate volume calculated at 49 g  Assessment & Plan:    - BPH with LUTS PVR by bladder scan today was 0 mL.  Urinalysis does show pyuria and is nitrite positive.  This could  potentially be contributing to his symptoms.  We will additionally increase his tamsulosin to 0.8 mg.  If urine culture is positive we will treat with antibiotics and see him back in 4 weeks for follow-up.  - Pyuria/bacteriuria As above  November 2020, MD  [facility] [address], [address] [place], [place] [phone]

## 2019-06-14 ENCOUNTER — Ambulatory Visit (INDEPENDENT_AMBULATORY_CARE_PROVIDER_SITE_OTHER): Payer: PRIVATE HEALTH INSURANCE | Admitting: Family Medicine

## 2019-06-14 ENCOUNTER — Encounter: Payer: Self-pay | Admitting: Family Medicine

## 2019-06-14 DIAGNOSIS — G63 Polyneuropathy in diseases classified elsewhere: Secondary | ICD-10-CM

## 2019-06-14 DIAGNOSIS — E889 Metabolic disorder, unspecified: Secondary | ICD-10-CM

## 2019-06-14 NOTE — Progress Notes (Signed)
[facility]  Virtual Visit via Telephone Note  I connected with Scott Buchanan on 06/14/19 at 11:20 AM EST by telephone and verified that I am speaking with the correct person using two identifiers.   I discussed the limitations, risks, security and privacy concerns of performing an evaluation and management service by telephone and the availability of in person appointments. I also discussed with the patient that there may be a patient responsible charge related to this service. The patient expressed understanding and agreed to proceed.   History of Present Illness: Scott Buchanan, a very pleasant 54 year old male with a medical history significant for type 2 diabetes mellitus, hypertension, hyperlipidemia, mild intermittent asthma, GERD and history of osteoarthritis is following up via phone after starting a trial of gabapentin for peripheral neuropathy.  Patient states the problem has improved on medication. He says that he has made an effort to improve diabetes by changing diet and increasing physical activity. He denies polyuria, polydipsia, or polyphagia. Patient says that he is taking all medications consistently without interruption.    Past Medical History:  Diagnosis Date  . Arthritis   . Asthma   . Diabetes mellitus without complication (HCC)    type 2  . Hypertension   . Primary localized osteoarthritis of right knee 03/02/2018   Social History   Socioeconomic History  . Marital status: Single    Spouse name: Not on file  . Number of children: 1  . Years of education: Not on file  . Highest education level: Not on file  Occupational History  . Occupation: maintance   Tobacco Use  . Smoking status: Former Smoker    Types: Cigarettes  . Smokeless tobacco: Never Used  . Tobacco comment: quit 10 years ago  Substance and Sexual Activity  . Alcohol use: No  . Drug use: No  . Sexual activity: Yes  Other Topics Concern  .  Not on file  Social History Narrative  . Not on file   Social Determinants of Health   Financial Resource Strain:   . Difficulty of Paying Living Expenses: Not on file  Food Insecurity:   . Worried About Programme researcher, broadcasting/film/video in the Last Year: Not on file  . Ran Out of Food in the Last Year: Not on file  Transportation Needs:   . Lack of Transportation (Medical): Not on file  . Lack of Transportation (Non-Medical): Not on file  Physical Activity:   . Days of Exercise per Week: Not on file  . Minutes of Exercise per Session: Not on file  Stress:   . Feeling of Stress : Not on file  Social Connections:   . Frequency of Communication with Friends and Family: Not on file  . Frequency of Social Gatherings with Friends and Family: Not on file  . Attends Religious Services: Not on file  . Active Member of Clubs or Organizations: Not on file  . Attends Banker Meetings: Not on file  . Marital Status: Not on file  Intimate Partner Violence:   . Fear of Current or Ex-Partner: Not on file  . Emotionally Abused: Not on file  . Physically Abused: Not on file  . Sexually Abused: Not on file   No Known Allergies  Review of Systems  HENT: Negative.   Eyes: Negative.   Respiratory: Negative.   Cardiovascular: Negative for palpitations.  Gastrointestinal: Negative.   Genitourinary: Negative.   Musculoskeletal: Negative.  Neurological: Positive for tingling (lower extremities).  Psychiatric/Behavioral: Negative.     Assessment and Plan: Peripheral neuropathy due to metabolic disorder (HCC) Continue Gabapentin at current dose, effective. No medication changes warranted at that time.   Follow Up Instructions: Follow up in 3 months for type 2 DM and hypertension.    I discussed the assessment and treatment plan with the patient. The patient was provided an opportunity to ask questions and all were answered. The patient agreed with the plan and demonstrated an understanding  of the instructions.   The patient was advised to call back or seek an in-person evaluation if the symptoms worsen or if the condition fails to improve as anticipated.  I provided 7 minutes of non-face-to-face time during this encounter.   Donia Pounds  APRN, MSN, FNP-C [facility] [address] [place], [place] [address] [phone]

## 2019-06-15 LAB — CULTURE, URINE COMPREHENSIVE

## 2019-06-20 ENCOUNTER — Telehealth: Payer: Self-pay | Admitting: *Deleted

## 2019-06-20 NOTE — Telephone Encounter (Signed)
-----   Message from Riki Altes, MD sent at 06/19/2019 12:55 PM EST ----- Urine culture was negative for infection

## 2019-06-20 NOTE — Telephone Encounter (Signed)
Notified patient as instructed, patient pleased. Discussed follow-up appointments, patient agrees  

## 2019-06-28 ENCOUNTER — Other Ambulatory Visit: Payer: Self-pay | Admitting: Family Medicine

## 2019-06-28 DIAGNOSIS — G629 Polyneuropathy, unspecified: Secondary | ICD-10-CM

## 2019-07-11 ENCOUNTER — Other Ambulatory Visit: Payer: Self-pay

## 2019-07-11 ENCOUNTER — Ambulatory Visit: Payer: PRIVATE HEALTH INSURANCE | Admitting: Urology

## 2019-07-11 ENCOUNTER — Encounter: Payer: Self-pay | Admitting: Urology

## 2019-07-11 VITALS — BP 153/67 | HR 114 | Ht 76.0 in | Wt 330.0 lb

## 2019-07-11 DIAGNOSIS — N400 Enlarged prostate without lower urinary tract symptoms: Secondary | ICD-10-CM | POA: Diagnosis not present

## 2019-07-11 DIAGNOSIS — R300 Dysuria: Secondary | ICD-10-CM

## 2019-07-11 DIAGNOSIS — R8281 Pyuria: Secondary | ICD-10-CM | POA: Diagnosis not present

## 2019-07-11 LAB — URINALYSIS, COMPLETE
Bilirubin, UA: NEGATIVE
Glucose, UA: NEGATIVE
Leukocytes,UA: NEGATIVE
Nitrite, UA: NEGATIVE
RBC, UA: NEGATIVE
Specific Gravity, UA: 1.02 (ref 1.005–1.030)
Urobilinogen, Ur: 0.2 mg/dL (ref 0.2–1.0)
pH, UA: 5 (ref 5.0–7.5)

## 2019-07-11 LAB — MICROSCOPIC EXAMINATION: RBC, Urine: NONE SEEN /hpf (ref 0–2)

## 2019-07-11 MED ORDER — TAMSULOSIN HCL 0.4 MG PO CAPS: 0.8000 mg | ORAL_CAPSULE | Freq: Every day | ORAL | 3 refills | Status: DC

## 2019-07-11 MED ORDER — SULFAMETHOXAZOLE-TRIMETHOPRIM 800-160 MG PO TABS: 1.0000 | ORAL_TABLET | Freq: Two times a day (BID) | ORAL | 0 refills | Status: AC

## 2019-07-11 NOTE — Progress Notes (Signed)
07/11/2019 1:13 PM   Scott Buchanan 1965-08-09 [id number]  Referring provider: Massie Maroon, FNP [address] [place],  [place] [address]  Chief Complaint  Patient presents with  . Follow-up    HPI: 54 y.o. male presents for follow-up.  -Initially seen 06/13/2019 with 54-month history of nocturia x3-4 and post void dribbling with urgency. -IPSS 17/35 -Had been on tamsulosin x30 days with mild improvement in symptoms -Urinalysis was nitrite positive with 11-30 WBC however urine culture grew mixed flora -Tamsulosin was increased to 0.8 mg -Notes significant improvement in his symptoms with resolution of his postvoid dribbling, nocturia improved to 1-2 and improvement in urgency -Does complain mild dysuria -Denies gross hematuria   PMH: Past Medical History:  Diagnosis Date  . Arthritis   . Asthma   . Diabetes mellitus without complication (HCC)    type 2  . Hypertension   . Primary localized osteoarthritis of right knee 03/02/2018    Surgical History: Past Surgical History:  Procedure Laterality Date  . CYSTECTOMY     tailbone  . ETHMOIDECTOMY Bilateral 04/07/2017   Procedure: BILATERAL ETHMOIDECTOMY AND SPHENOIDECTOMY;  Surgeon: Newman Pies, MD;  Location: [facility];  Service: ENT;  Laterality: Bilateral;  . KNEE ARTHROSCOPY W/ MENISCAL REPAIR     right and left knee one year apart  . PARTIAL KNEE ARTHROPLASTY Right 03/02/2018   Procedure: UNICOMPARTMENTAL KNEE;  Surgeon: Teryl Lucy, MD;  Location: [facility];  Service: Orthopedics;  Laterality: Right;  with block  . SINUS ENDO WITH FUSION Bilateral 04/07/2017   Procedure: BILATERAL FRONTAL RECESS SINUS EXPLORATION WITH SINUS FUSION NAVIGATION;  Surgeon: Newman Pies, MD;  Location: [facility];  Service: ENT;  Laterality: Bilateral;  . SINUS EXPLORATION    . TONSILLECTOMY    . TURBINATE REDUCTION Bilateral 04/07/2017   Procedure: BILATERAL TURBINATE REDUCTION;  Surgeon: Newman Pies, MD;  Location:  SURGERY CENTER;  Service: ENT;  Laterality: Bilateral;  . unicompartmental right knee      Home Medications:  Allergies as of 07/11/2019   No Known Allergies     Medication List       Accurate as of July 11, 2019  1:13 PM. If you have any questions, ask your nurse or doctor.        albuterol 108 (90 Base) MCG/ACT inhaler Commonly known as: VENTOLIN HFA Inhale 2 puffs into the lungs every 6 (six) hours as needed for wheezing or shortness of breath.   amLODipine 10 MG tablet Commonly known as: NORVASC Take 1 tablet by mouth once daily   aspirin EC 81 MG tablet Take 81 mg by mouth daily.   atorvastatin 20 MG tablet Commonly known as: LIPITOR Take 1 tablet (20 mg total) by mouth daily.   cholecalciferol 1000 units tablet Commonly known as: VITAMIN D Take 1,000 Units by mouth daily.   Flonase 50 MCG/ACT nasal spray Generic drug: fluticasone Place 2 sprays into both nostrils daily.   gabapentin 300 MG capsule Commonly known as: NEURONTIN Take 1 capsule by mouth once daily at bedtime   hydrochlorothiazide 12.5 MG tablet Commonly known as: HYDRODIURIL Take 1 tablet (12.5 mg total) by mouth daily.   levocetirizine 5 MG tablet Commonly known as: XYZAL Take 1 tablet (5 mg total) by mouth every evening.   meloxicam 7.5 MG tablet Commonly known as: MOBIC Take 1 tablet (7.5 mg total) by mouth daily.   metFORMIN 1000 MG tablet Commonly known as: GLUCOPHAGE Take  1 tablet (1,000 mg total) by mouth 2 (two) times daily with a meal.   montelukast 10 MG tablet Commonly known as: SINGULAIR Take 1 tablet (10 mg total) by mouth at bedtime.   omeprazole 40 MG capsule Commonly known as: PRILOSEC Take 1 capsule (40 mg total) by mouth daily.   PROBIOTIC ADVANCED PO Take by mouth.   tamsulosin 0.4 MG Caps capsule Commonly known as: FLOMAX Take 2 capsules (0.8 mg total) by mouth daily.   Trulicity 6.26 RS/8.5IO Sopn Generic drug:  Dulaglutide Inject 0.75 mg into the skin once a week.   vitamin C 100 MG tablet Take 100 mg by mouth daily.       Allergies: No Known Allergies  Family History: Family History  Problem Relation Age of Onset  . Hypertension Mother   . Colon cancer Neg Hx   . Colon polyps Neg Hx   . Stomach cancer Neg Hx   . Rectal cancer Neg Hx   . Esophageal cancer Neg Hx   . Inflammatory bowel disease Neg Hx   . Liver disease Neg Hx   . Pancreatic cancer Neg Hx     Social History:  reports that he has quit smoking. His smoking use included cigarettes. He has never used smokeless tobacco. He reports that he does not drink alcohol or use drugs.   Physical Exam: BP (!) 153/67   Pulse (!) 114   Ht 6\' 4"  (1.93 m)   Wt (!) 330 lb (149.7 kg)   BMI 40.17 kg/m   Constitutional:  Alert and oriented, No acute distress. HEENT: [place] AT, moist mucus membranes.  Trachea midline, no masses. Cardiovascular: No clubbing, cyanosis, or edema. Respiratory: Normal respiratory effort, no increased work of breathing.   Laboratory Data:  Urinalysis Microscopy 11-30 WBC   Assessment & Plan:    - BPH with LUTS Currently satisfied with voiding pattern with increased tamsulosin to 0.8 mg.  Rx sent.  Follow-up 1 year or earlier for worsening voiding symptoms  - Dysuria UA today with persistent pyuria.  Repeat urine culture ordered.  Will go ahead and start Septra DS 1 twice daily x2 weeks   Abbie Sons, MD  [facility] [address], [place], [place] [address] [phone]

## 2019-07-14 ENCOUNTER — Telehealth: Payer: Self-pay | Admitting: *Deleted

## 2019-07-14 LAB — CULTURE, URINE COMPREHENSIVE

## 2019-07-14 NOTE — Telephone Encounter (Addendum)
Patient informed, voiced understanding.  ----- Message from Levada Schilling, CMA sent at 07/14/2019  2:05 PM EDT -----  ----- Message ----- From: Riki Altes, MD Sent: 07/14/2019   1:43 PM EDT To: Levada Schilling, CMA  Urine culture was positive and sensitive to the prescribed antibiotic

## 2019-07-30 ENCOUNTER — Other Ambulatory Visit: Payer: Self-pay | Admitting: Family Medicine

## 2019-07-30 DIAGNOSIS — G629 Polyneuropathy, unspecified: Secondary | ICD-10-CM

## 2019-08-02 ENCOUNTER — Other Ambulatory Visit: Payer: Self-pay | Admitting: Family Medicine

## 2019-08-02 DIAGNOSIS — G629 Polyneuropathy, unspecified: Secondary | ICD-10-CM

## 2019-08-08 ENCOUNTER — Ambulatory Visit: Payer: PRIVATE HEALTH INSURANCE | Attending: Internal Medicine

## 2019-08-08 DIAGNOSIS — Z20822 Contact with and (suspected) exposure to covid-19: Secondary | ICD-10-CM

## 2019-08-09 LAB — SARS-COV-2, NAA 2 DAY TAT

## 2019-08-09 LAB — NOVEL CORONAVIRUS, NAA: SARS-CoV-2, NAA: NOT DETECTED

## 2019-08-21 ENCOUNTER — Other Ambulatory Visit: Payer: Self-pay | Admitting: Family Medicine

## 2019-08-21 DIAGNOSIS — Z9109 Other allergy status, other than to drugs and biological substances: Secondary | ICD-10-CM

## 2019-08-21 DIAGNOSIS — J3089 Other allergic rhinitis: Secondary | ICD-10-CM

## 2019-08-21 DIAGNOSIS — I10 Essential (primary) hypertension: Secondary | ICD-10-CM

## 2019-09-13 ENCOUNTER — Other Ambulatory Visit: Payer: Self-pay

## 2019-09-13 ENCOUNTER — Ambulatory Visit (INDEPENDENT_AMBULATORY_CARE_PROVIDER_SITE_OTHER): Payer: PRIVATE HEALTH INSURANCE | Admitting: Family Medicine

## 2019-09-13 VITALS — BP 146/83 | HR 79 | Temp 97.7°F | Ht 76.0 in | Wt 321.4 lb

## 2019-09-13 DIAGNOSIS — E785 Hyperlipidemia, unspecified: Secondary | ICD-10-CM | POA: Diagnosis not present

## 2019-09-13 DIAGNOSIS — E559 Vitamin D deficiency, unspecified: Secondary | ICD-10-CM

## 2019-09-13 DIAGNOSIS — E1169 Type 2 diabetes mellitus with other specified complication: Secondary | ICD-10-CM | POA: Diagnosis not present

## 2019-09-13 DIAGNOSIS — R82998 Other abnormal findings in urine: Secondary | ICD-10-CM

## 2019-09-13 DIAGNOSIS — E669 Obesity, unspecified: Secondary | ICD-10-CM

## 2019-09-13 DIAGNOSIS — I10 Essential (primary) hypertension: Secondary | ICD-10-CM

## 2019-09-13 DIAGNOSIS — G629 Polyneuropathy, unspecified: Secondary | ICD-10-CM

## 2019-09-13 DIAGNOSIS — J0100 Acute maxillary sinusitis, unspecified: Secondary | ICD-10-CM

## 2019-09-13 LAB — POCT URINALYSIS DIPSTICK
Bilirubin, UA: NEGATIVE
Blood, UA: NEGATIVE
Glucose, UA: NEGATIVE
Ketones, UA: NEGATIVE
Nitrite, UA: NEGATIVE
Protein, UA: NEGATIVE
Spec Grav, UA: 1.01 (ref 1.010–1.025)
Urobilinogen, UA: 0.2 E.U./dL
pH, UA: 5.5 (ref 5.0–8.0)

## 2019-09-13 LAB — POCT GLYCOSYLATED HEMOGLOBIN (HGB A1C): Hemoglobin A1C: 5.9 % — AB (ref 4.0–5.6)

## 2019-09-13 MED ORDER — AZITHROMYCIN 250 MG PO TABS: ORAL_TABLET | ORAL | 0 refills | Status: DC

## 2019-09-13 MED ORDER — GABAPENTIN 300 MG PO CAPS: 300.0000 mg | ORAL_CAPSULE | Freq: Three times a day (TID) | ORAL | 1 refills | Status: DC

## 2019-09-13 NOTE — Progress Notes (Signed)
[facility]   Established Patient Office Visit  Subjective:  Patient ID: Scott Buchanan, male    DOB: [birth year]  Age: 54 y.o. MRN: [id number]  CC:  Chief Complaint  Patient presents with  . Follow-up    sinus congestion, believes to have tendinitis left shoulder  . finger wart    Requesting referral to have wart in finger removed    HPI  Scott Buchanan is a very pleasant 54 year old male with a medical history significant for type 2 diabetes mellitus, hypertension, environmental allergies, and hyperlipidemia presents for follow-up of chronic conditions.  Patient is also complaining of sinus pressure, pain, and congestion over the past week without relief from Flonase and over-the-counter antihistamine.  Diabetes He has type 2 diabetes mellitus. His disease course has been stable. Hypoglycemia symptoms include headaches. Pertinent negatives for hypoglycemia include no hunger, pallor or seizures. Pertinent negatives for diabetes include no blurred vision, no chest pain, no fatigue, no polydipsia, no polyphagia, no polyuria, no visual change, no weakness and no weight loss. Symptoms are stable. Risk factors for coronary artery disease include diabetes mellitus, hypertension, obesity and male sex. Current diabetic treatment includes insulin injections. He is compliant with treatment all of the time. He is following a generally healthy diet. When asked about meal planning, he reported none. He has not had a previous visit with a dietitian. He participates in exercise daily. There is no change in his home blood glucose trend.  Hypertension This is a chronic problem. The current episode started today. The problem is controlled. Associated symptoms include headaches. Pertinent negatives include no blurred vision, chest pain, orthopnea, palpitations or shortness of breath. Risk factors for coronary artery disease include obesity.  Sinusitis This is a  new problem. The problem is unchanged. There has been no fever. His pain is at a severity of 4/10. Associated symptoms include congestion, ear pain, headaches and sinus pressure. Pertinent negatives include no shortness of breath or swollen glands. Past treatments include saline sprays and spray decongestants.    Past Medical History:  Diagnosis Date  . Arthritis   . Asthma   . Diabetes mellitus without complication (HCC)    type 2  . Hypertension   . Primary localized osteoarthritis of right knee 03/02/2018    Past Surgical History:  Procedure Laterality Date  . CYSTECTOMY     tailbone  . ETHMOIDECTOMY Bilateral 04/07/2017   Procedure: BILATERAL ETHMOIDECTOMY AND SPHENOIDECTOMY;  Surgeon: Newman Pies, MD;  Location: [facility];  Service: ENT;  Laterality: Bilateral;  . KNEE ARTHROSCOPY W/ MENISCAL REPAIR     right and left knee one year apart  . PARTIAL KNEE ARTHROPLASTY Right 03/02/2018   Procedure: UNICOMPARTMENTAL KNEE;  Surgeon: Teryl Lucy, MD;  Location: [facility];  Service: Orthopedics;  Laterality: Right;  with block  . SINUS ENDO WITH FUSION Bilateral 04/07/2017   Procedure: BILATERAL FRONTAL RECESS SINUS EXPLORATION WITH SINUS FUSION NAVIGATION;  Surgeon: Newman Pies, MD;  Location: [facility];  Service: ENT;  Laterality: Bilateral;  . SINUS EXPLORATION    . TONSILLECTOMY    . TURBINATE REDUCTION Bilateral 04/07/2017   Procedure: BILATERAL TURBINATE REDUCTION;  Surgeon: Newman Pies, MD;  Location: [facility];  Service: ENT;  Laterality: Bilateral;  . unicompartmental right knee      Family History  Problem Relation Age of Onset  . Hypertension Mother   . Colon cancer Neg Hx   . Colon polyps  Neg Hx   . Stomach cancer Neg Hx   . Rectal cancer Neg Hx   . Esophageal cancer Neg Hx   . Inflammatory bowel disease Neg Hx   . Liver disease Neg Hx   . Pancreatic cancer Neg Hx     Social History   Socioeconomic History  . Marital  status: Single    Spouse name: Not on file  . Number of children: 1  . Years of education: Not on file  . Highest education level: Not on file  Occupational History  . Occupation: maintance   Tobacco Use  . Smoking status: Former Smoker    Types: Cigarettes  . Smokeless tobacco: Never Used  . Tobacco comment: quit 10 years ago  Substance and Sexual Activity  . Alcohol use: No  . Drug use: No  . Sexual activity: Yes  Other Topics Concern  . Not on file  Social History Narrative  . Not on file   Social Determinants of Health   Financial Resource Strain:   . Difficulty of Paying Living Expenses:   Food Insecurity:   . Worried About Programme researcher, broadcasting/film/video in the Last Year:   . Barista in the Last Year:   Transportation Needs:   . Freight forwarder (Medical):   Marland Kitchen Lack of Transportation (Non-Medical):   Physical Activity:   . Days of Exercise per Week:   . Minutes of Exercise per Session:   Stress:   . Feeling of Stress :   Social Connections:   . Frequency of Communication with Friends and Family:   . Frequency of Social Gatherings with Friends and Family:   . Attends Religious Services:   . Active Member of Clubs or Organizations:   . Attends Banker Meetings:   Marland Kitchen Marital Status:   Intimate Partner Violence:   . Fear of Current or Ex-Partner:   . Emotionally Abused:   Marland Kitchen Physically Abused:   . Sexually Abused:     Outpatient Medications Prior to Visit  Medication Sig Dispense Refill  . albuterol (VENTOLIN HFA) 108 (90 Base) MCG/ACT inhaler Inhale 2 puffs into the lungs every 6 (six) hours as needed for wheezing or shortness of breath. 18 g 3  . amLODipine (NORVASC) 10 MG tablet Take 1 tablet by mouth once daily 90 tablet 0  . Ascorbic Acid (VITAMIN C) 100 MG tablet Take 100 mg by mouth daily.    Marland Kitchen aspirin EC 81 MG tablet Take 81 mg by mouth daily.    Marland Kitchen atorvastatin (LIPITOR) 20 MG tablet Take 1 tablet (20 mg total) by mouth daily. 90 tablet 3    . cholecalciferol (VITAMIN D) 1000 units tablet Take 1,000 Units by mouth daily.    . Dulaglutide (TRULICITY) 0.75 MG/0.5ML SOPN Inject 0.75 mg into the skin once a week. 0.5 mL 12  . fluticasone (FLONASE) 50 MCG/ACT nasal spray Place 2 sprays into both nostrils daily.     Marland Kitchen gabapentin (NEURONTIN) 300 MG capsule Take 1 capsule by mouth once daily at bedtime 30 capsule 0  . hydrochlorothiazide (HYDRODIURIL) 12.5 MG tablet Take 1 tablet (12.5 mg total) by mouth daily. 90 tablet 3  . levocetirizine (XYZAL) 5 MG tablet TAKE 1 TABLET BY MOUTH ONCE DAILY IN THE EVENING 90 tablet 0  . meloxicam (MOBIC) 7.5 MG tablet Take 1 tablet (7.5 mg total) by mouth daily. 30 tablet 1  . metFORMIN (GLUCOPHAGE) 1000 MG tablet Take 1 tablet (1,000 mg total) by  mouth 2 (two) times daily with a meal. 180 tablet 3  . montelukast (SINGULAIR) 10 MG tablet TAKE 1 TABLET BY MOUTH AT BEDTIME 90 tablet 0  . omeprazole (PRILOSEC) 40 MG capsule Take 1 capsule (40 mg total) by mouth daily. 30 capsule 4  . tamsulosin (FLOMAX) 0.4 MG CAPS capsule Take 2 capsules (0.8 mg total) by mouth daily. 180 capsule 3  . Probiotic Product (PROBIOTIC ADVANCED PO) Take by mouth.     Facility-Administered Medications Prior to Visit  Medication Dose Route Frequency Provider Last Rate Last Admin  . 0.9 %  sodium chloride infusion  500 mL Intravenous Once Mansouraty, Telford Nab., MD        No Known Allergies  ROS Review of Systems  Constitutional: Negative for fatigue and weight loss.  HENT: Positive for congestion, ear pain and sinus pressure.   Eyes: Negative for blurred vision.  Respiratory: Negative for shortness of breath.   Cardiovascular: Negative for chest pain, palpitations and orthopnea.  Endocrine: Negative for polydipsia, polyphagia and polyuria.  Skin: Negative for pallor.  Neurological: Positive for headaches. Negative for seizures and weakness.      Objective:    Physical Exam  Constitutional: He is oriented to  person, place, and time. He appears well-developed and well-nourished.  HENT:  Head: Normocephalic.  Cardiovascular: Normal rate and regular rhythm.  Pulmonary/Chest: Effort normal and breath sounds normal.  Abdominal: Soft.  Musculoskeletal:        General: Normal range of motion.  Neurological: He is alert and oriented to person, place, and time.  Skin: Skin is warm.  Psychiatric: He has a normal mood and affect. His behavior is normal. Judgment and thought content normal.    BP (!) 146/83 (BP Location: Left Arm, Patient Position: Sitting, Cuff Size: Large)   Pulse 79   Temp 97.7 F (36.5 C)   Ht 6\' 4"  (1.93 m)   Wt (!) 321 lb 6 oz (145.8 kg)   SpO2 99%   BMI 39.12 kg/m  Wt Readings from Last 3 Encounters:  09/13/19 (!) 321 lb 6 oz (145.8 kg)  07/11/19 (!) 330 lb (149.7 kg)  06/13/19 (!) 330 lb (149.7 kg)     Health Maintenance Due  Topic Date Due  . COVID-19 Vaccine (1) Never done  . URINE MICROALBUMIN  02/06/2019    There are no preventive care reminders to display for this patient.  Lab Results  Component Value Date   TSH 2.430 11/09/2017   Lab Results  Component Value Date   WBC 14.4 (H) 03/03/2018   HGB 13.0 03/03/2018   HCT 40.0 03/03/2018   MCV 84.2 03/03/2018   PLT 353 03/03/2018   Lab Results  Component Value Date   NA 136 05/10/2019   K 4.1 05/10/2019   CO2 19 (L) 05/10/2019   GLUCOSE 282 (H) 05/10/2019   BUN 16 05/10/2019   CREATININE 0.80 05/10/2019   BILITOT 0.7 03/10/2019   ALKPHOS 59 03/10/2019   AST 44 (H) 03/10/2019   ALT 71 (H) 03/10/2019   PROT 7.4 03/10/2019   ALBUMIN 4.6 03/10/2019   CALCIUM 10.2 05/10/2019   ANIONGAP 10 03/03/2018   Lab Results  Component Value Date   CHOL 194 08/16/2018   Lab Results  Component Value Date   HDL 34 (L) 08/16/2018   Lab Results  Component Value Date   LDLCALC 103 (H) 08/16/2018   Lab Results  Component Value Date   TRIG 285 (H) 08/16/2018   Lab Results  Component Value Date    CHOLHDL 5.7 (H) 08/16/2018   Lab Results  Component Value Date   HGBA1C 12.0 (A) 05/10/2019      Assessment & Plan:   Problem List Items Addressed This Visit      Cardiovascular and Mediastinum   Essential hypertension - Primary   Relevant Orders   Urinalysis Dipstick   Basic Metabolic Panel     Endocrine   Diabetes mellitus type 2 in obese (HCC)   Relevant Orders   HgB A1c   Microalbumin/Creatinine Ratio, Urine     Other   Vitamin D deficiency   Relevant Orders   Vitamin D, 25-hydroxy   Morbid obesity (HCC)   Hyperlipidemia LDL goal <100    BP (!) 146/83 (BP Location: Left Arm, Patient Position: Sitting, Cuff Size: Large)   Pulse 79   Temp 97.7 F (36.5 C)   Ht 6\' 4"  (1.93 m)   Wt (!) 321 lb 6 oz (145.8 kg)   SpO2 99%   BMI 39.12 kg/m   No orders of the defined types were placed in this encounter.  1. Essential hypertension - Continue medication, monitor blood pressure at home. Continue DASH diet. Reminder to go to the ER if any CP, SOB, nausea, dizziness, severe HA, changes vision/speech, left arm numbness and tingling and jaw pain.    - Urinalysis Dipstick - Basic Metabolic Panel  2. Diabetes mellitus type 2 in obese (HCC) Hemoglobin A1c decreased to 5.9 from 12.04 months prior.  Decrease Metformin to 500 mg twice daily.  Patient advised to continue carbohydrate modified diet and exercise plan. - HgB A1c - Microalbumin/Creatinine Ratio, Urine  3. Hyperlipidemia LDL goal <100 The 10-year ASCVD risk score DC Jr., et al., 2013) is: 16%   Values used to calculate the score:     Age: 52 years     Sex: Male     Is Non-Hispanic African American: No     Diabetic: Yes     Tobacco smoker: No     Systolic Blood Pressure: 146 mmHg     Is BP treated: Yes     HDL Cholesterol: 34 mg/dL     Total Cholesterol: 194 mg/dL  4. Morbid obesity (HCC) The patient is asked to make an attempt to improve diet and exercise patterns to aid in medical management of  this problem.   5. Vitamin D deficiency - Vitamin D, 25-hydroxy  6. Neuropathy - gabapentin (NEURONTIN) 300 MG capsule; Take 1 capsule (300 mg total) by mouth 3 (three) times daily.  Dispense: 90 capsule; Refill: 1  7. Acute maxillary sinusitis, recurrence not specified - azithromycin (ZITHROMAX) 250 MG tablet; Day 1 500 mg and Days 2-5 take 250 mg.  Dispense: 6 tablet; Refill: 0  8. Urine leukocytes - Urine Culture Follow-up: No follow-ups on file.    44  APRN, MSN, FNP-C [facility] [address] [place], [place] [phone]

## 2019-09-13 NOTE — Patient Instructions (Signed)
Diabetes Mellitus and Foot Care Foot care is an important part of your health, especially when you have diabetes. Diabetes may cause you to have problems because of poor blood flow (circulation) to your feet and legs, which can cause your skin to:  Become thinner and drier.  Break more easily.  Heal more slowly.  Peel and crack. You may also have nerve damage (neuropathy) in your legs and feet, causing decreased feeling in them. This means that you may not notice minor injuries to your feet that could lead to more serious problems. Noticing and addressing any potential problems early is the best way to prevent future foot problems. How to care for your feet Foot hygiene  Wash your feet daily with warm water and mild soap. Do not use hot water. Then, pat your feet and the areas between your toes until they are completely dry. Do not soak your feet as this can dry your skin.  Trim your toenails straight across. Do not dig under them or around the cuticle. File the edges of your nails with an emery board or nail file.  Apply a moisturizing lotion or petroleum jelly to the skin on your feet and to dry, brittle toenails. Use lotion that does not contain alcohol and is unscented. Do not apply lotion between your toes. Shoes and socks  Wear clean socks or stockings every day. Make sure they are not too tight. Do not wear knee-high stockings since they may decrease blood flow to your legs.  Wear shoes that fit properly and have enough cushioning. Always look in your shoes before you put them on to be sure there are no objects inside.  To break in new shoes, wear them for just a few hours a day. This prevents injuries on your feet. Wounds, scrapes, corns, and calluses  Check your feet daily for blisters, cuts, bruises, sores, and redness. If you cannot see the bottom of your feet, use a mirror or ask someone for help.  Do not cut corns or calluses or try to remove them with medicine.  If you  find a minor scrape, cut, or break in the skin on your feet, keep it and the skin around it clean and dry. You may clean these areas with mild soap and water. Do not clean the area with peroxide, alcohol, or iodine.  If you have a wound, scrape, corn, or callus on your foot, look at it several times a day to make sure it is healing and not infected. Check for: ? Redness, swelling, or pain. ? Fluid or blood. ? Warmth. ? Pus or a bad smell. General instructions  Do not cross your legs. This may decrease blood flow to your feet.  Do not use heating pads or hot water bottles on your feet. They may burn your skin. If you have lost feeling in your feet or legs, you may not know this is happening until it is too late.  Protect your feet from hot and cold by wearing shoes, such as at the beach or on hot pavement.  Schedule a complete foot exam at least once a year (annually) or more often if you have foot problems. If you have foot problems, report any cuts, sores, or bruises to your health care provider immediately. Contact a health care provider if:  You have a medical condition that increases your risk of infection and you have any cuts, sores, or bruises on your feet.  You have an injury that is not   healing.  You have redness on your legs or feet.  You feel burning or tingling in your legs or feet.  You have pain or cramps in your legs and feet.  Your legs or feet are numb.  Your feet always feel cold.  You have pain around a toenail. Get help right away if:  You have a wound, scrape, corn, or callus on your foot and: ? You have pain, swelling, or redness that gets worse. ? You have fluid or blood coming from the wound, scrape, corn, or callus. ? Your wound, scrape, corn, or callus feels warm to the touch. ? You have pus or a bad smell coming from the wound, scrape, corn, or callus. ? You have a fever. ? You have a red line going up your leg. Summary  Check your feet every day  for cuts, sores, red spots, swelling, and blisters.  Moisturize feet and legs daily.  Wear shoes that fit properly and have enough cushioning.  If you have foot problems, report any cuts, sores, or bruises to your health care provider immediately.  Schedule a complete foot exam at least once a year (annually) or more often if you have foot problems. This information is not intended to replace advice given to you by your health care provider. Make sure you discuss any questions you have with your health care provider. Document Revised: 12/29/2018 Document Reviewed: 05/09/2016 Elsevier Patient Education  2020 Elsevier Inc.  

## 2019-09-14 ENCOUNTER — Telehealth: Payer: Self-pay

## 2019-09-14 LAB — BASIC METABOLIC PANEL
BUN/Creatinine Ratio: 19 (ref 9–20)
BUN: 16 mg/dL (ref 6–24)
CO2: 17 mmol/L — ABNORMAL LOW (ref 20–29)
Calcium: 10.3 mg/dL — ABNORMAL HIGH (ref 8.7–10.2)
Chloride: 101 mmol/L (ref 96–106)
Creatinine, Ser: 0.86 mg/dL (ref 0.76–1.27)
GFR calc Af Amer: 115 mL/min/{1.73_m2} (ref >59)
GFR calc non Af Amer: 100 mL/min/{1.73_m2} (ref >59)
Glucose: 100 mg/dL — ABNORMAL HIGH (ref 65–99)
Potassium: 3.9 mmol/L (ref 3.5–5.2)
Sodium: 138 mmol/L (ref 134–144)

## 2019-09-14 LAB — MICROALBUMIN / CREATININE URINE RATIO
Creatinine, Urine: 28.9 mg/dL
Microalb/Creat Ratio: 127 mg/g creat — ABNORMAL HIGH (ref 0–29)
Microalbumin, Urine: 36.6 ug/mL

## 2019-09-14 LAB — VITAMIN D 25 HYDROXY (VIT D DEFICIENCY, FRACTURES): Vit D, 25-Hydroxy: 43.7 ng/mL (ref 30.0–100.0)

## 2019-09-14 NOTE — Telephone Encounter (Signed)
-----   Message from Massie Maroon, [place] sent at 09/14/2019  6:30 AM EDT ----- Regarding: lab results Please inform patient that hemoglobin a1C has decreased to 5.9, which is at goal. Remind patient medication changes made during appointment. Metformin was decreased to 500 mg twice daily. No changes were made to Trulicity. Continue to follow a carbohydrate modified diet and remain physically active.  Gabapentin was increased to 300 mg three times per day for diabetic neuropathy. Remind him not to drink alcohol, drive, or operate machinery while taking this medication.   Remind patient to follow up in office as scheduled.   Nolon Nations  APRN, MSN, FNP-C Patient Care [facility] [address] [place], [place] [address] [phone]

## 2019-09-14 NOTE — Telephone Encounter (Signed)
Scott Maroon, FNP  Charise Carwin, RMA  Please inform patient that hemoglobin a1C has decreased to 5.9, which is at goal. Remind patient medication changes made during appointment. Metformin was decreased to 500 mg twice daily. No changes were made to Trulicity. Continue to follow a carbohydrate modified diet and remain physically active.  Gabapentin was increased to 300 mg three times per day for diabetic neuropathy. Remind him not to drink alcohol, drive, or operate machinery while taking this medication.   Remind patient to follow up in office as scheduled.   Nolon Nations APRN, MSN, FNP-C  Patient Care [facility]  [address] [place], [place] [address]  [phone]  [facility]  Patient informed.

## 2019-09-15 LAB — URINE CULTURE

## 2019-09-20 ENCOUNTER — Telehealth: Payer: Self-pay | Admitting: Family Medicine

## 2019-09-20 ENCOUNTER — Other Ambulatory Visit: Payer: Self-pay | Admitting: Family Medicine

## 2019-09-20 DIAGNOSIS — J0100 Acute maxillary sinusitis, unspecified: Secondary | ICD-10-CM

## 2019-09-20 MED ORDER — AMOXICILLIN-POT CLAVULANATE 875-125 MG PO TABS: 1.0000 | ORAL_TABLET | Freq: Two times a day (BID) | ORAL | 0 refills | Status: AC

## 2019-09-20 NOTE — Telephone Encounter (Signed)
Pts sinus meds not working. Pt wants to see if he can get some antibiotics

## 2019-09-20 NOTE — Progress Notes (Signed)
Meds ordered this encounter  Medications   amoxicillin-clavulanate (AUGMENTIN) 875-125 MG tablet    Sig: Take 1 tablet by mouth 2 (two) times daily for 10 days.    Dispense:  20 tablet    Refill:  0    Order Specific Question:   Supervising Provider    Answer:   Quentin Angst [[id number]]     Nolon Nations  APRN, MSN, FNP-C Patient Care [facility] [address] [place], [place] [address] [phone]

## 2019-09-20 NOTE — Telephone Encounter (Signed)
Called spoke with patient  he said that he has finished the z-pak,however he is still having a lot congestion ,pressure behind his eye,stuffy nose. He said that he was told to call back if didn't help. That you would send in a medrol dose pack. Pt also wants to know  the results,of his urine culture. Please advise.

## 2019-09-21 ENCOUNTER — Telehealth: Payer: Self-pay

## 2019-09-21 NOTE — Telephone Encounter (Signed)
Called and informed pt of his results of urine culture and to being course of medication for the next 10 days.

## 2019-09-21 NOTE — Telephone Encounter (Signed)
-----   Message from Massie Maroon, [place] sent at 09/20/2019  4:48 PM EDT ----- Regarding: FW: lab results  ----- Message ----- From: Massie Maroon, FNP Sent: 09/20/2019   4:29 PM EDT To: Massie Maroon, FNP Subject: lab results                                    I have sent a course of Augmentin 875-125 mg every 12 hours for 10 days, which will cover urinary tract infection with E.coli as well.  Please advise patient to follow up in office as needed.   Nolon Nations  APRN, MSN, FNP-C Patient Care [facility] [address] [place], [place] [address] [phone]

## 2019-10-10 ENCOUNTER — Other Ambulatory Visit: Payer: Self-pay | Admitting: Gastroenterology

## 2019-11-17 ENCOUNTER — Other Ambulatory Visit: Payer: Self-pay | Admitting: Gastroenterology

## 2019-11-17 ENCOUNTER — Ambulatory Visit (INDEPENDENT_AMBULATORY_CARE_PROVIDER_SITE_OTHER): Payer: PRIVATE HEALTH INSURANCE | Admitting: Gastroenterology

## 2019-11-17 ENCOUNTER — Encounter: Payer: Self-pay | Admitting: Gastroenterology

## 2019-11-17 ENCOUNTER — Other Ambulatory Visit (INDEPENDENT_AMBULATORY_CARE_PROVIDER_SITE_OTHER): Payer: PRIVATE HEALTH INSURANCE

## 2019-11-17 VITALS — BP 132/86 | HR 110 | Ht 75.0 in | Wt 323.2 lb

## 2019-11-17 DIAGNOSIS — K760 Fatty (change of) liver, not elsewhere classified: Secondary | ICD-10-CM

## 2019-11-17 DIAGNOSIS — R7989 Other specified abnormal findings of blood chemistry: Secondary | ICD-10-CM

## 2019-11-17 DIAGNOSIS — R194 Change in bowel habit: Secondary | ICD-10-CM

## 2019-11-17 DIAGNOSIS — R197 Diarrhea, unspecified: Secondary | ICD-10-CM

## 2019-11-17 DIAGNOSIS — Z8719 Personal history of other diseases of the digestive system: Secondary | ICD-10-CM

## 2019-11-17 LAB — HEPATIC FUNCTION PANEL
ALT: 59 U/L — ABNORMAL HIGH (ref 0–53)
AST: 34 U/L (ref 0–37)
Albumin: 4.5 g/dL (ref 3.5–5.2)
Alkaline Phosphatase: 49 U/L (ref 39–117)
Bilirubin, Direct: 0.1 mg/dL (ref 0.0–0.3)
Total Bilirubin: 0.5 mg/dL (ref 0.2–1.2)
Total Protein: 7.3 g/dL (ref 6.0–8.3)

## 2019-11-17 MED ORDER — DICYCLOMINE HCL 10 MG PO CAPS
10.0000 mg | ORAL_CAPSULE | Freq: Three times a day (TID) | ORAL | 1 refills | Status: DC
Start: 2019-11-17 — End: 2021-07-18

## 2019-11-17 NOTE — Patient Instructions (Addendum)
Your provider has requested that you go to the basement level for lab work before leaving today. Press "B" on the elevator. The lab is located at the first door on the left as you exit the elevator.  If you are age 52 or older, your body mass index should be between 23-30. Your Body mass index is 40.4 kg/m. If this is out of the aforementioned range listed, please consider follow up with your Primary Care Provider.  If you are age 6 or younger, your body mass index should be between 19-25. Your Body mass index is 40.4 kg/m. If this is out of the aformentioned range listed, please consider follow up with your Primary Care Provider.   We have sent the following medications to your pharmacy for you to pick up at your convenience: Bentyl   START FIBER CON ONCE DAILY.   After 1 week if your still have diarrhea then you may use Imodium as directed.   Call office in 3 weeks if your diarrhea is not better. Will consider colonoscopy at that time.   Thank you for choosing me and [facility].  Dr. Meridee Score

## 2019-11-18 ENCOUNTER — Other Ambulatory Visit: Payer: PRIVATE HEALTH INSURANCE

## 2019-11-18 DIAGNOSIS — R194 Change in bowel habit: Secondary | ICD-10-CM

## 2019-11-18 DIAGNOSIS — R7989 Other specified abnormal findings of blood chemistry: Secondary | ICD-10-CM

## 2019-11-18 DIAGNOSIS — R197 Diarrhea, unspecified: Secondary | ICD-10-CM

## 2019-11-18 DIAGNOSIS — K760 Fatty (change of) liver, not elsewhere classified: Secondary | ICD-10-CM

## 2019-11-20 ENCOUNTER — Encounter: Payer: Self-pay | Admitting: Gastroenterology

## 2019-11-20 DIAGNOSIS — K760 Fatty (change of) liver, not elsewhere classified: Secondary | ICD-10-CM | POA: Insufficient documentation

## 2019-11-20 DIAGNOSIS — R197 Diarrhea, unspecified: Secondary | ICD-10-CM | POA: Insufficient documentation

## 2019-11-20 DIAGNOSIS — R7989 Other specified abnormal findings of blood chemistry: Secondary | ICD-10-CM | POA: Insufficient documentation

## 2019-11-20 DIAGNOSIS — Z8719 Personal history of other diseases of the digestive system: Secondary | ICD-10-CM | POA: Insufficient documentation

## 2019-11-20 DIAGNOSIS — R194 Change in bowel habit: Secondary | ICD-10-CM | POA: Insufficient documentation

## 2019-11-20 HISTORY — DX: Fatty (change of) liver, not elsewhere classified: K76.0

## 2019-11-20 NOTE — Progress Notes (Signed)
GASTROENTEROLOGY OUTPATIENT CLINIC VISIT   Primary Care Provider Massie Maroon, FNP [address] [place] [address] [phone]  Patient Profile: Scott Buchanan is a 54 y.o. male with a pmh significant for diabetes, hypertension, asthma, arthritis, obesity, likely fatty liver disease, GERD with prior esophagitis.The patient presents to the [facility] for an evaluation and management of problem(s) noted below:  Problem List 1. Acute diarrhea   2. Change in bowel habits   3. Elevated LFTs   4. Fatty liver   5. History of esophagitis     History of Present Illness Please see initial consultation note by [facility] and my prior progress notes for full details of HPI.  Interval History The patient returns for follow-up and this is the first time I see him since December 2020.  Since his last visit an upper endoscopy was performed, patient has been doing very well.  His previous issues of abdominal discomfort and intermittent bowel habits had improved significantly.  However, over the course the last week, he has now had diarrhea return.  Is not clear if this is a result of just being out in the heat more frequently but certainly he has noticed multiple episodes of diarrhea.  No blood in the stools.  He denies anyone else being sick around him.  His previous esophagitis and GERD has improved and he remains on PPI therapy with good effect.  He denies any overt dysphagia.  GI Review of Systems Positive as above Negative for nausea, vomiting, abdominal pain, fecal urgency  Review of Systems General: Denies fevers/chills Cardiovascular: Denies chest pain Pulmonary: Denies shortness of breath Gastroenterological: See HPI Genitourinary: Denies darkened urine Hematological: Denies easy bruising/bleeding Endocrine: Denies temperature intolerance Dermatological: Denies jaundice Psychological: Mood is stable   Medications Current Outpatient  Medications  Medication Sig Dispense Refill  . albuterol (VENTOLIN HFA) 108 (90 Base) MCG/ACT inhaler Inhale 2 puffs into the lungs every 6 (six) hours as needed for wheezing or shortness of breath. 18 g 3  . amLODipine (NORVASC) 10 MG tablet Take 1 tablet by mouth once daily 90 tablet 0  . Ascorbic Acid (VITAMIN C) 100 MG tablet Take 100 mg by mouth daily.    Marland Kitchen aspirin EC 81 MG tablet Take 81 mg by mouth daily.    Marland Kitchen atorvastatin (LIPITOR) 20 MG tablet Take 1 tablet (20 mg total) by mouth daily. 90 tablet 3  . cholecalciferol (VITAMIN D) 1000 units tablet Take 1,000 Units by mouth daily.    . Dulaglutide (TRULICITY) 0.75 MG/0.5ML SOPN Inject 0.75 mg into the skin once a week. 0.5 mL 12  . fluticasone (FLONASE) 50 MCG/ACT nasal spray Place 2 sprays into both nostrils daily.     . hydrochlorothiazide (HYDRODIURIL) 12.5 MG tablet Take 1 tablet (12.5 mg total) by mouth daily. 90 tablet 3  . levocetirizine (XYZAL) 5 MG tablet TAKE 1 TABLET BY MOUTH ONCE DAILY IN THE EVENING 90 tablet 0  . metFORMIN (GLUCOPHAGE) 1000 MG tablet Take 1 tablet (1,000 mg total) by mouth 2 (two) times daily with a meal. (Patient taking differently: Take 500 mg by mouth 2 (two) times daily with a meal. ) 180 tablet 3  . montelukast (SINGULAIR) 10 MG tablet TAKE 1 TABLET BY MOUTH AT BEDTIME 90 tablet 0  . tamsulosin (FLOMAX) 0.4 MG CAPS capsule Take 2 capsules (0.8 mg total) by mouth daily. 180 capsule 3  . dicyclomine (BENTYL) 10 MG capsule Take 1 capsule (10 mg  total) by mouth 4 (four) times daily -  before meals and at bedtime. 60 capsule 1  . omeprazole (PRILOSEC) 40 MG capsule Take 1 capsule by mouth once daily 30 capsule 11   Current Facility-Administered Medications  Medication Dose Route Frequency Provider Last Rate Last Admin  . 0.9 %  sodium chloride infusion  500 mL Intravenous Once Mansouraty, Netty Starring., MD        Allergies No Known Allergies  Histories Past Medical History:  Diagnosis Date  . Arthritis    . Asthma   . Diabetes mellitus without complication (HCC)    type 2  . Hypertension   . Primary localized osteoarthritis of right knee 03/02/2018   Past Surgical History:  Procedure Laterality Date  . CYSTECTOMY     tailbone  . ETHMOIDECTOMY Bilateral 04/07/2017   Procedure: BILATERAL ETHMOIDECTOMY AND SPHENOIDECTOMY;  Surgeon: Newman Pies, MD;  Location: [facility];  Service: ENT;  Laterality: Bilateral;  . KNEE ARTHROSCOPY W/ MENISCAL REPAIR     right and left knee one year apart  . PARTIAL KNEE ARTHROPLASTY Right 03/02/2018   Procedure: UNICOMPARTMENTAL KNEE;  Surgeon: Teryl Lucy, MD;  Location: [facility];  Service: Orthopedics;  Laterality: Right;  with block  . SINUS ENDO WITH FUSION Bilateral 04/07/2017   Procedure: BILATERAL FRONTAL RECESS SINUS EXPLORATION WITH SINUS FUSION NAVIGATION;  Surgeon: Newman Pies, MD;  Location: [facility];  Service: ENT;  Laterality: Bilateral;  . SINUS EXPLORATION    . TONSILLECTOMY    . TURBINATE REDUCTION Bilateral 04/07/2017   Procedure: BILATERAL TURBINATE REDUCTION;  Surgeon: Newman Pies, MD;  Location: [facility];  Service: ENT;  Laterality: Bilateral;  . unicompartmental right knee     Social History   Socioeconomic History  . Marital status: Single    Spouse name: Not on file  . Number of children: 1  . Years of education: Not on file  . Highest education level: Not on file  Occupational History  . Occupation: maintance   Tobacco Use  . Smoking status: Former Smoker    Types: Cigarettes  . Smokeless tobacco: Never Used  . Tobacco comment: quit 10 years ago  Vaping Use  . Vaping Use: Never used  Substance and Sexual Activity  . Alcohol use: No  . Drug use: No  . Sexual activity: Yes  Other Topics Concern  . Not on file  Social History Narrative  . Not on file   Social Determinants of Health   Financial Resource Strain:   . Difficulty of Paying Living Expenses:   Food Insecurity:    . Worried About Programme researcher, broadcasting/film/video in the Last Year:   . Barista in the Last Year:   Transportation Needs:   . Freight forwarder (Medical):   Marland Kitchen Lack of Transportation (Non-Medical):   Physical Activity:   . Days of Exercise per Week:   . Minutes of Exercise per Session:   Stress:   . Feeling of Stress :   Social Connections:   . Frequency of Communication with Friends and Family:   . Frequency of Social Gatherings with Friends and Family:   . Attends Religious Services:   . Active Member of Clubs or Organizations:   . Attends Banker Meetings:   Marland Kitchen Marital Status:   Intimate Partner Violence:   . Fear of Current or Ex-Partner:   . Emotionally Abused:   Marland Kitchen Physically Abused:   . Sexually Abused:  Family History  Problem Relation Age of Onset  . Hypertension Mother   . Colon cancer Neg Hx   . Colon polyps Neg Hx   . Stomach cancer Neg Hx   . Rectal cancer Neg Hx   . Esophageal cancer Neg Hx   . Inflammatory bowel disease Neg Hx   . Liver disease Neg Hx   . Pancreatic cancer Neg Hx    I have reviewed his medical, social, and family history in detail and updated the electronic medical record as necessary.    PHYSICAL EXAMINATION  BP (!) 132/86 (BP Location: Left Arm, Patient Position: Sitting, Cuff Size: Large)   Pulse (!) 110   Ht 6\' 3"  (1.905 m)   Wt (!) 323 lb 4 oz (146.6 kg)   SpO2 97%   BMI 40.40 kg/m  Wt Readings from Last 3 Encounters:  11/17/19 (!) 323 lb 4 oz (146.6 kg)  09/13/19 (!) 321 lb 6 oz (145.8 kg)  07/11/19 (!) 330 lb (149.7 kg)  GEN: NAD, appears stated age, doesn't appear chronically ill PSYCH: Cooperative, without pressured speech EYE: Conjunctivae pink, sclerae anicteric ENT: MMM CV: Nontachycardic RESP: No audible wheezing GI: NABS, soft, obese, rounded, nontender, without rebound or guarding SKIN: No jaundice NEURO:  Alert & Oriented x 3, no focal deficits   REVIEW OF DATA  I reviewed the following data at  the time of this encounter:  GI Procedures and Studies  Previously reviewed  Laboratory Studies  Reviewed those in epic  Imaging Studies  No new imaging studies to review   ASSESSMENT  Mr. Voorhies is a 54 y.o. male with a pmh significant for diabetes, hypertension, asthma, arthritis, obesity, likely fatty liver disease, GERD with prior esophagitis.  The patient is seen today for evaluation and management of:  1. Acute diarrhea   2. Change in bowel habits   3. Elevated LFTs   4. Fatty liver   5. History of esophagitis    The patient is hemodynamically stable.  Clinically however, he has had a recurrence of diarrhea and change in bowel habits.  He had done very well for over 6 months without issues but over the last week is having problems.  I suspect more of an infectious etiology rather than microscopic/collagenous colitis or IBD.  He had a previous Cologuard that was negative.  However, if the infectious work-up is unremarkable that we are planning and he continues to have intermittent bowel habit changes, then a diagnostic colonoscopy for biopsies to rule out disorders of the colon would be reasonable.  We discussed that his liver biochemical testing has been abnormal for a few years.  We will repeat to see where things are at this time.  He may benefit from a liver biopsy to ensure we are not missing any other etiologies for his abnormal liver tests though fatty liver disease is most likely the etiology in the setting of his multiple risk factors and comorbidities.  We discussed the role of good blood pressure control, good diabetes control, as well as weight loss and Mediterranean diet.  We will hold on liver biopsy for now but consider in the future.  All patient questions were answered, to the best of my ability, and the patient agrees to the aforementioned plan of action with follow-up as indicated.   PLAN  Laboratories as outlined below Stool studies as outlined below If  intermittent bowel habits persist and negative stool studies then diagnostic colonoscopy worthwhile to consider Continue PPI therapy once  daily Bentyl as needed for abdominal cramping/spasms If LFTs remain abnormal, then varying liver biopsy though most likely this is fatty liver related   Orders Placed This Encounter  Procedures  . Stool Culture  . Hepatic function panel  . Clostridium difficile Toxin B, Qualitative, Real-Time PCR  . Pancreatic elastase, fecal    New Prescriptions   DICYCLOMINE (BENTYL) 10 MG CAPSULE    Take 1 capsule (10 mg total) by mouth 4 (four) times daily -  before meals and at bedtime.   Modified Medications   Modified Medication Previous Medication   OMEPRAZOLE (PRILOSEC) 40 MG CAPSULE omeprazole (PRILOSEC) 40 MG capsule      Take 1 capsule by mouth once daily    Take 1 capsule by mouth once daily    Planned Follow Up No follow-ups on file.   Total Time in Face-to-Face and in Coordination of Care for patient including independent/personal interpretation/review of prior testing, medical history, examination, medication adjustment, communicating results with the patient directly, and documentation with the EHR is 30 minutes.  Camia Dipinto MaCorliss Parishnsouraty, MD [facility] Advanced Endoscopy Office # [phone]

## 2019-11-24 LAB — STOOL CULTURE
MICRO NUMBER:: 10770115
MICRO NUMBER:: 10770116
MICRO NUMBER:: 10770117
SHIGA RESULT:: NOT DETECTED
SPECIMEN QUALITY:: ADEQUATE
SPECIMEN QUALITY:: ADEQUATE
SPECIMEN QUALITY:: ADEQUATE

## 2019-11-24 LAB — CLOSTRIDIUM DIFFICILE TOXIN B, QUALITATIVE, REAL-TIME PCR: Toxigenic C. Difficile by PCR: NOT DETECTED

## 2019-11-24 LAB — PANCREATIC ELASTASE, FECAL: Pancreatic Elastase-1, Stool: >500 mcg/g

## 2019-11-28 ENCOUNTER — Other Ambulatory Visit: Payer: Self-pay | Admitting: Family Medicine

## 2019-11-28 DIAGNOSIS — J3089 Other allergic rhinitis: Secondary | ICD-10-CM

## 2019-11-28 DIAGNOSIS — I10 Essential (primary) hypertension: Secondary | ICD-10-CM

## 2019-11-28 DIAGNOSIS — Z9109 Other allergy status, other than to drugs and biological substances: Secondary | ICD-10-CM

## 2019-12-07 IMAGING — US US ABDOMEN COMPLETE
1 series · 13 of 25 positions shown · non-contrast
Comparison: None.

CLINICAL DATA: Abdominal pain and elevated liver enzymes

EXAM:
ABDOMEN ULTRASOUND COMPLETE

[Series 1: us abdomen complete · 13 of 84 slices shown]
[im 1/84]
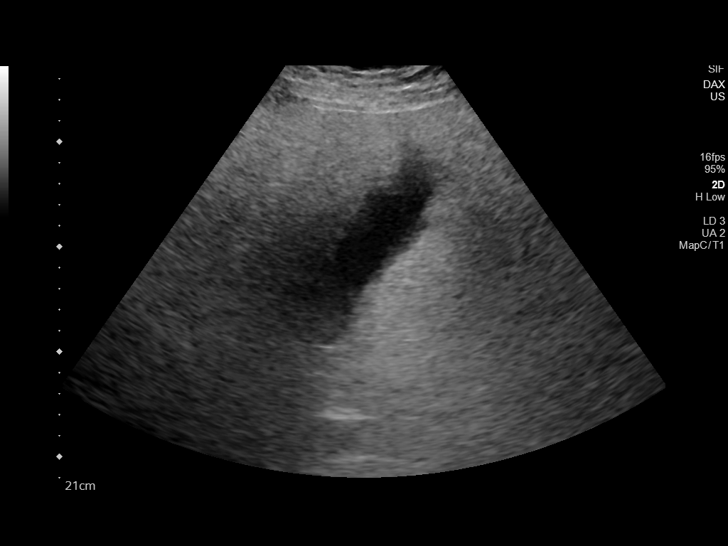
[im 7/84]
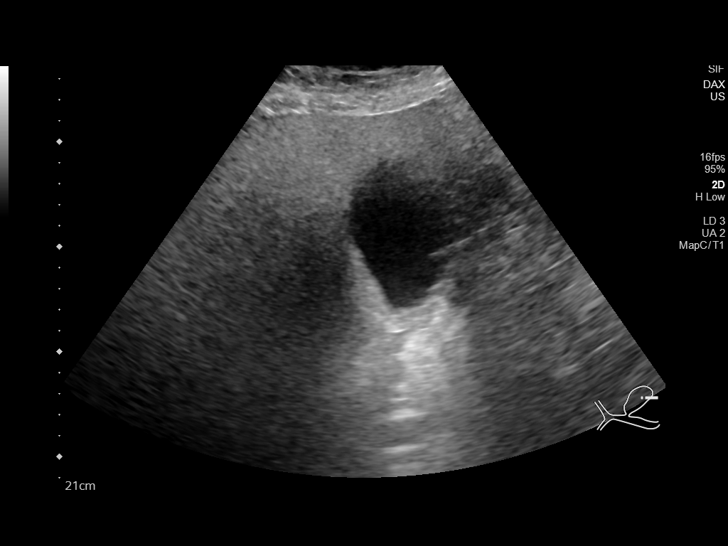
[im 14/84]
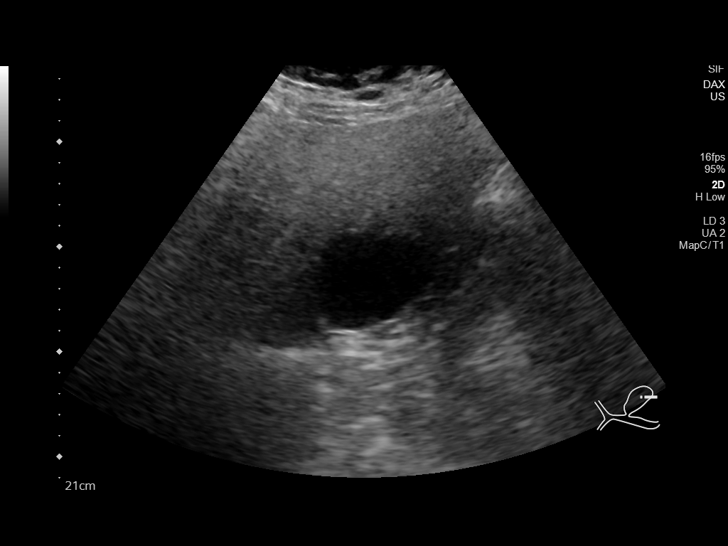
[im 21/84]
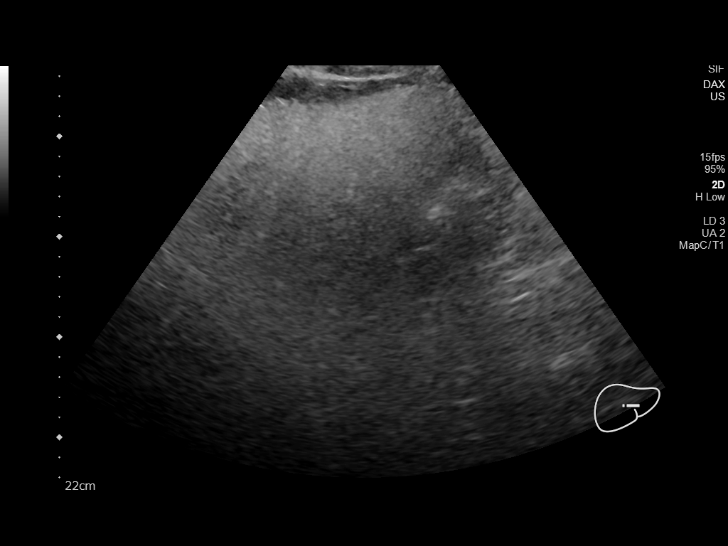
[im 28/84]
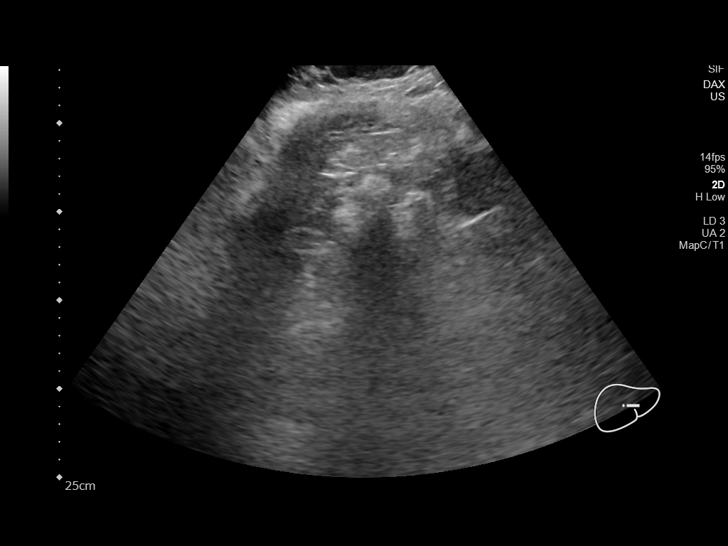
[im 35/84]
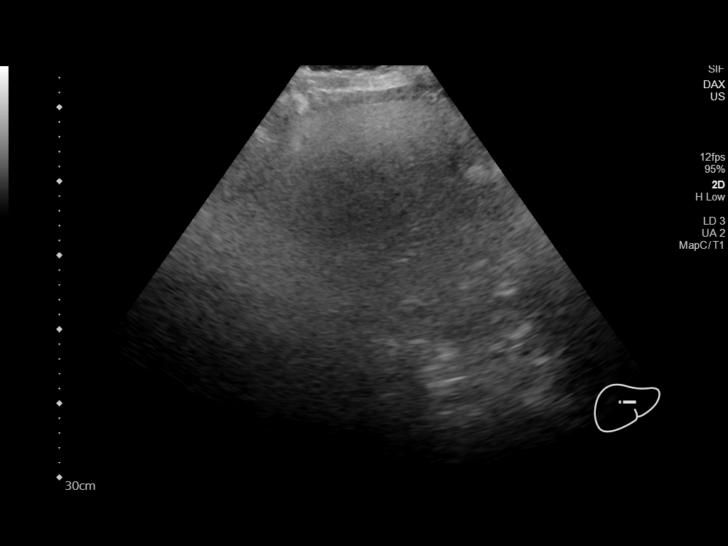
[im 42/84]
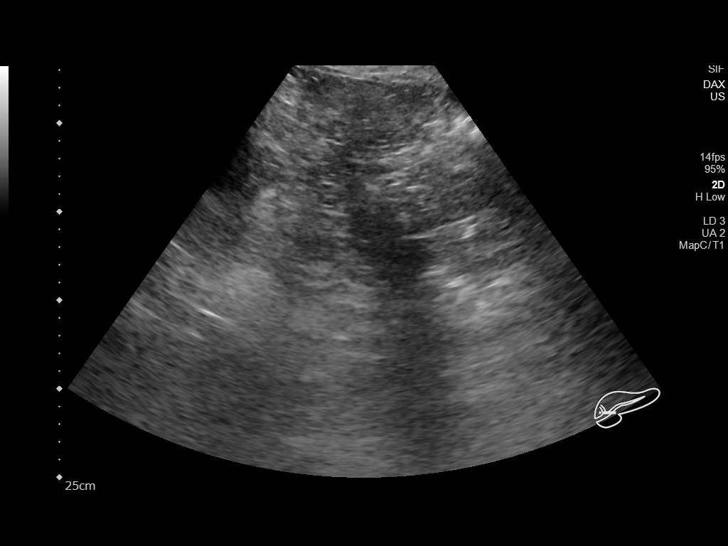
[im 49/84]
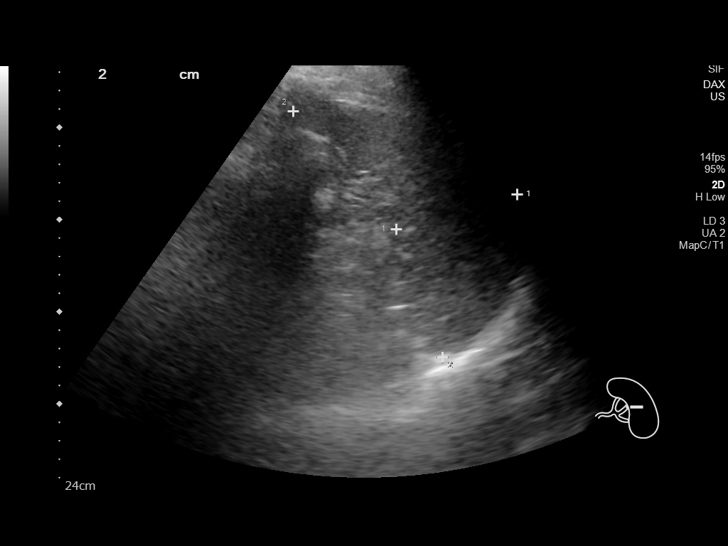
[im 56/84]
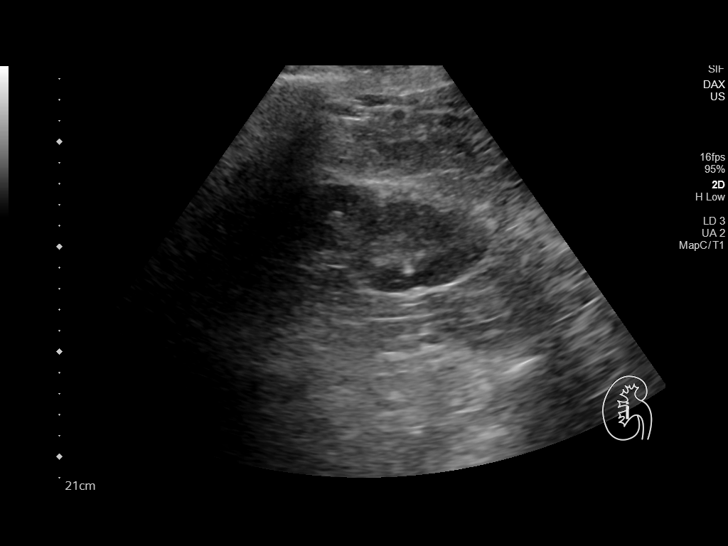
[im 63/84]
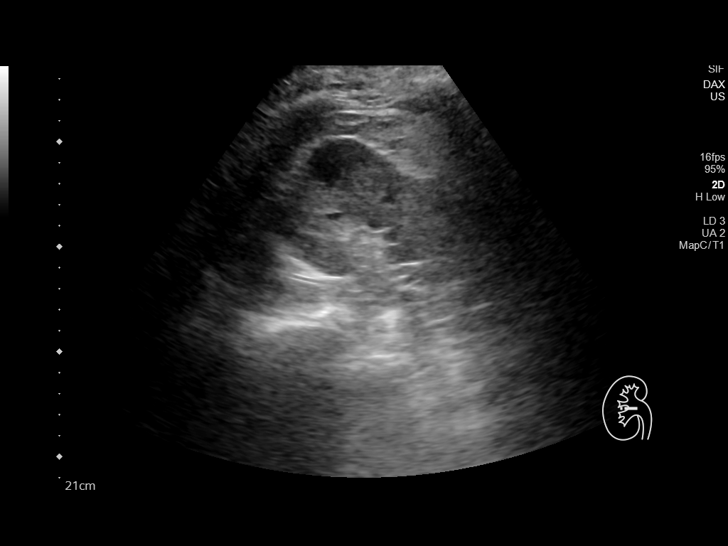
[im 70/84]
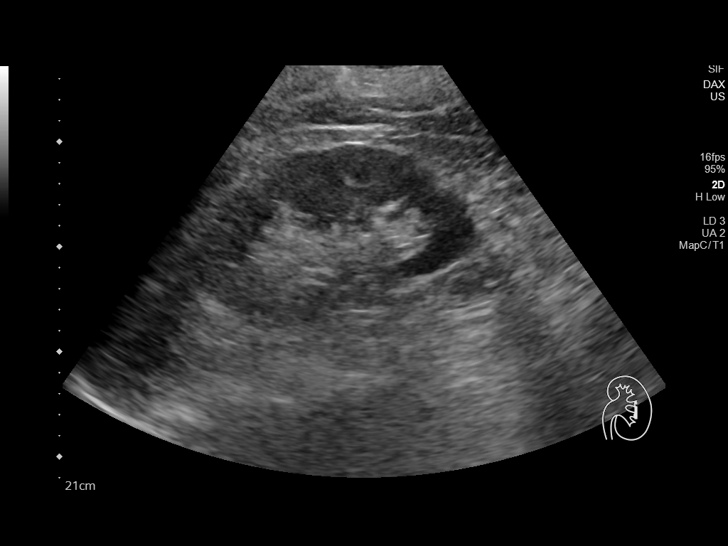
[im 77/84]
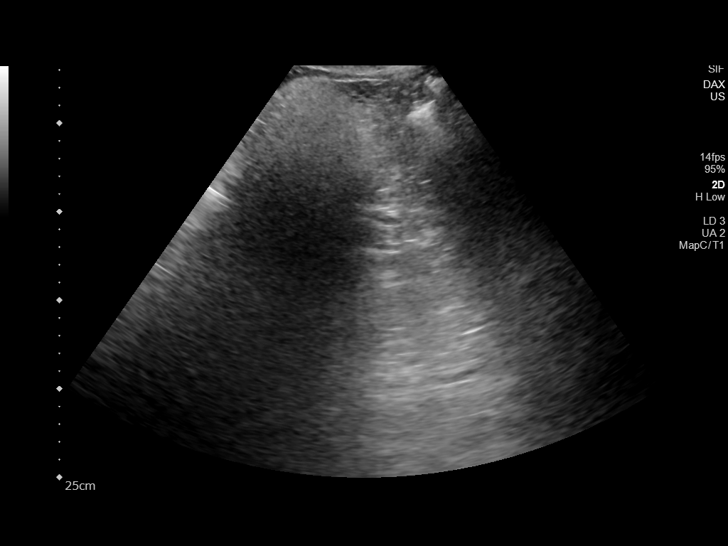
[im 84/84]
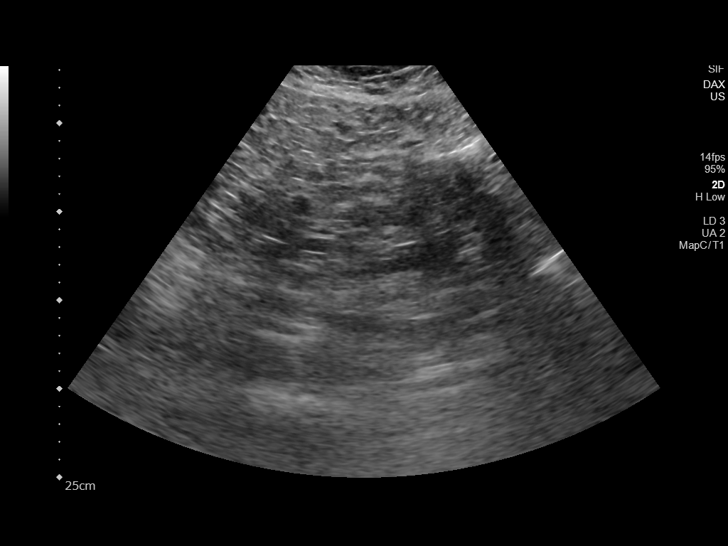

[13 of 25 positions shown; findings below may reference images not displayed]

FINDINGS: Gallbladder: No gallstones or wall thickening visualized. There is
no pericholecystic fluid. No sonographic Murphy sign noted by
sonographer.

Common bile duct: Diameter: 6 mm. No intrahepatic, common hepatic,
common bile duct dilatation.

Liver: No focal lesion identified. Liver echogenicity is increased
markedly. Portal venous structures could not be visualized due to
the diffuse increased echogenicity in the liver.

IVC: Obscured by gas and marked liver echogenicity.

Pancreas: Essentially completely obscured by gas.

Spleen: Size and appearance within normal limits.

Right Kidney: Length: 13.9 cm. Echogenicity within normal limits. No
hydronephrosis visualized. There is a cyst in the mid right kidney
measuring approximately 3 x 3 x 3 cm.

Left Kidney: Length: 13.5 cm. Echogenicity within normal limits. No
mass or hydronephrosis visualized.

Abdominal aorta: No aneurysm visualized.

Other findings: No demonstrable ascites.
IMPRESSION: 1. Diffuse increase in liver echogenicity consistent with hepatic
steatosis. Note that the marked increased liver echotexture obscures
visualization of the biliary ductal system. While no focal liver
lesions are evident on this study, it must be cautioned that the
sensitivity of ultrasound for detection of focal liver lesions is
diminished significantly in this circumstance.

2. Inferior vena cava and pancreas obscured by gas in the increased
liver echogenicity.

3.  Right renal cyst.

4.  Study otherwise unremarkable.

## 2019-12-13 ENCOUNTER — Encounter: Payer: Self-pay | Admitting: Family Medicine

## 2019-12-13 ENCOUNTER — Other Ambulatory Visit: Payer: Self-pay

## 2019-12-13 ENCOUNTER — Ambulatory Visit (INDEPENDENT_AMBULATORY_CARE_PROVIDER_SITE_OTHER): Payer: PRIVATE HEALTH INSURANCE | Admitting: Family Medicine

## 2019-12-13 VITALS — BP 148/105 | HR 78 | Temp 98.2°F | Ht 76.0 in | Wt 329.0 lb

## 2019-12-13 DIAGNOSIS — E785 Hyperlipidemia, unspecified: Secondary | ICD-10-CM | POA: Diagnosis not present

## 2019-12-13 DIAGNOSIS — I10 Essential (primary) hypertension: Secondary | ICD-10-CM

## 2019-12-13 DIAGNOSIS — E669 Obesity, unspecified: Secondary | ICD-10-CM | POA: Diagnosis not present

## 2019-12-13 DIAGNOSIS — E1169 Type 2 diabetes mellitus with other specified complication: Secondary | ICD-10-CM

## 2019-12-13 DIAGNOSIS — R5383 Other fatigue: Secondary | ICD-10-CM

## 2019-12-13 LAB — POCT URINALYSIS DIPSTICK
Bilirubin, UA: NEGATIVE
Blood, UA: NEGATIVE
Glucose, UA: NEGATIVE
Ketones, UA: NEGATIVE
Leukocytes, UA: NEGATIVE
Nitrite, UA: NEGATIVE
Protein, UA: NEGATIVE
Spec Grav, UA: 1.01 (ref 1.010–1.025)
Urobilinogen, UA: 0.2 E.U./dL
pH, UA: 5.5 (ref 5.0–8.0)

## 2019-12-13 LAB — POCT GLYCOSYLATED HEMOGLOBIN (HGB A1C)
HbA1c POC (<> result, manual entry): 6 % (ref 4.0–5.6)
HbA1c, POC (controlled diabetic range): 6 % (ref 0.0–7.0)
HbA1c, POC (prediabetic range): 6 % (ref 5.7–6.4)
Hemoglobin A1C: 6 % — AB (ref 4.0–5.6)

## 2019-12-13 NOTE — Progress Notes (Signed)
[facility]   Subjective:    Patient ID: Scott Buchanan, male    DOB: 06-15-1965, 54 y.o.   MRN: [id number]   Scott Buchanan is a 54 year old male with a medical history significant for type 2 diabetes mellitus, hypertension, hyperlipidemia, morbid obesity, GERD, and peripheral neuropathy presents for a follow up of chronic chronic conditions. Patient says that he doing fairly well and has minimal complaints.  He is complaining of some fatigue. Patient says that he mostly works outside and the weather has been extremely hot. He denies any dizziness, dry mouth, blurred vision, polyuria, polydipsia, or polyphagia.   Diabetes He has type 2 diabetes mellitus. His disease course has been stable. Pertinent negatives for hypoglycemia include no dizziness, headaches, hunger, pallor, speech difficulty or sweats. Pertinent negatives for diabetes include no blurred vision, no chest pain, no polydipsia, no polyphagia, no polyuria, no weakness and no weight loss. Symptoms are stable. Pertinent negatives for diabetic complications include no CVA. Risk factors for coronary artery disease include diabetes mellitus, obesity, male sex and hypertension. He is compliant with treatment all of the time. There is no change in his home blood glucose trend. He does not see a podiatrist.Eye exam is not current.  Hypertension This is a chronic problem. The problem is controlled. Pertinent negatives include no blurred vision, chest pain, headaches or sweats. There are no associated agents to hypertension. There are no known risk factors for coronary artery disease. Past treatments include nothing. Compliance problems include diet.  There is no history of kidney disease, CAD/MI, CVA or heart failure.  Hyperlipidemia This is a chronic problem. The problem is uncontrolled. Pertinent negatives include no chest pain. The current treatment provides no improvement of lipids.   Past  Medical History:  Diagnosis Date  . Arthritis   . Asthma   . Diabetes mellitus without complication (HCC)    type 2  . Hypertension   . Primary localized osteoarthritis of right knee 03/02/2018   Social History   Socioeconomic History  . Marital status: Single    Spouse name: Not on file  . Number of children: 1  . Years of education: Not on file  . Highest education level: Not on file  Occupational History  . Occupation: maintance   Tobacco Use  . Smoking status: Former Smoker    Types: Cigarettes  . Smokeless tobacco: Never Used  . Tobacco comment: quit 10 years ago  Vaping Use  . Vaping Use: Never used  Substance and Sexual Activity  . Alcohol use: No  . Drug use: No  . Sexual activity: Yes  Other Topics Concern  . Not on file  Social History Narrative  . Not on file   Social Determinants of Health   Financial Resource Strain:   . Difficulty of Paying Living Expenses: Not on file  Food Insecurity:   . Worried About Programme researcher, broadcasting/film/video in the Last Year: Not on file  . Ran Out of Food in the Last Year: Not on file  Transportation Needs:   . Lack of Transportation (Medical): Not on file  . Lack of Transportation (Non-Medical): Not on file  Physical Activity:   . Days of Exercise per Week: Not on file  . Minutes of Exercise per Session: Not on file  Stress:   . Feeling of Stress : Not on file  Social Connections:   . Frequency of Communication with Friends and Family: Not on file  .  Frequency of Social Gatherings with Friends and Family: Not on file  . Attends Religious Services: Not on file  . Active Member of Clubs or Organizations: Not on file  . Attends Banker Meetings: Not on file  . Marital Status: Not on file  Intimate Partner Violence:   . Fear of Current or Ex-Partner: Not on file  . Emotionally Abused: Not on file  . Physically Abused: Not on file  . Sexually Abused: Not on file   Immunization History  Administered Date(s)  Administered  . Influenza,inj,Quad PF,6+ Mos 02/05/2018  . PFIZER SARS-COV-2 Vaccination 10/26/2019, 11/16/2019  . Pneumococcal Polysaccharide-23 04/27/2017  . Tdap 10/21/2016   Review of Systems  Constitutional: Negative for activity change, appetite change and weight loss.  HENT: Negative.   Eyes: Negative.  Negative for blurred vision.  Respiratory: Negative.   Cardiovascular: Negative.  Negative for chest pain.  Gastrointestinal: Negative.   Endocrine: Negative.  Negative for polydipsia, polyphagia and polyuria.  Genitourinary: Negative.   Musculoskeletal: Negative for arthralgias and back pain.  Skin: Negative.  Negative for pallor.  Allergic/Immunologic: Negative.   Neurological: Negative.  Negative for dizziness, speech difficulty, weakness and headaches.  Psychiatric/Behavioral: Negative.        Objective:   Physical Exam Constitutional:      Appearance: Normal appearance. He is obese.  HENT:     Head: Normocephalic.     Nose: Nose normal.  Cardiovascular:     Rate and Rhythm: Normal rate and regular rhythm.     Pulses: Normal pulses.  Pulmonary:     Effort: Pulmonary effort is normal.  Abdominal:     General: Abdomen is flat. Bowel sounds are normal.  Musculoskeletal:     Cervical back: Normal range of motion.  Skin:    General: Skin is warm.  Neurological:     General: No focal deficit present.     Mental Status: He is alert. Mental status is at baseline.  Psychiatric:        Mood and Affect: Mood normal.        Behavior: Behavior normal.        Thought Content: Thought content normal.        Judgment: Judgment normal.     BP (!) 148/105   Pulse 78   Temp 98.2 F (36.8 C) (Temporal)   Ht 6\' 4"  (1.93 m)   Wt (!) 329 lb (149.2 kg)   SpO2 98%   BMI 40.05 kg/m     Assessment & Plan:  1. Essential hypertension Continue medication, monitor blood pressure at home. Continue DASH diet. Reminder to go to the ER if any CP, SOB, nausea, dizziness, severe  HA, changes vision/speech, left arm numbness and tingling and jaw pain.  - Urinalysis Dipstick - Comprehensive metabolic panel  2. Diabetes mellitus type 2 in obese (HCC) Patient's hemoglobin a1c is 6.0, which is at goal. No medication changes are warranted at this time.  - POCT HgB A1C - Comprehensive metabolic panel  3. Hyperlipidemia LDL goal <100 The 10-year ASCVD risk score DC Jr., et al., 2013) is: 16.4%   Values used to calculate the score:     Age: 72 years     Sex: Male     Is Non-Hispanic African American: No     Diabetic: Yes     Tobacco smoker: No     Systolic Blood Pressure: 148 mmHg     Is BP treated: Yes     HDL Cholesterol:  34 mg/dL     Total Cholesterol: 194 mg/dL - Lipid Panel  4. Morbid obesity (HCC) The patient is asked to make an attempt to improve diet and exercise patterns to aid in medical management of this problem.  5. Other fatigue Patient advised to hydrate consistently when working outside in hot whether.  - Anemia panel   Follow up in 3 months   Kameren Baade Rennis Petty  APRN, MSN, FNP-C [facility] [address] [place], [place] [address] [phone]

## 2019-12-13 NOTE — Patient Instructions (Signed)

## 2019-12-15 LAB — ANEMIA PANEL
Ferritin: 102 ng/mL (ref 30–400)
Folate, Hemolysate: 368 ng/mL
Folate, RBC: 844 ng/mL (ref >498)
Hematocrit: 43.6 % (ref 37.5–51.0)
Iron Saturation: 21 % (ref 15–55)
Iron: 89 ug/dL (ref 38–169)
Retic Ct Pct: 1.6 % (ref 0.6–2.6)
Total Iron Binding Capacity: 434 ug/dL (ref 250–450)
UIBC: 345 ug/dL — ABNORMAL HIGH (ref 111–343)
Vitamin B-12: 455 pg/mL (ref 232–1245)

## 2019-12-15 LAB — COMPREHENSIVE METABOLIC PANEL
ALT: 55 IU/L — ABNORMAL HIGH (ref 0–44)
AST: 39 IU/L (ref 0–40)
Albumin/Globulin Ratio: 1.8 (ref 1.2–2.2)
Albumin: 4.8 g/dL (ref 3.8–4.9)
Alkaline Phosphatase: 56 IU/L (ref 48–121)
BUN/Creatinine Ratio: 13 (ref 9–20)
BUN: 12 mg/dL (ref 6–24)
Bilirubin Total: 0.5 mg/dL (ref 0.0–1.2)
CO2: 19 mmol/L — ABNORMAL LOW (ref 20–29)
Calcium: 10.7 mg/dL — ABNORMAL HIGH (ref 8.7–10.2)
Chloride: 102 mmol/L (ref 96–106)
Creatinine, Ser: 0.9 mg/dL (ref 0.76–1.27)
GFR calc Af Amer: 113 mL/min/{1.73_m2} (ref >59)
GFR calc non Af Amer: 98 mL/min/{1.73_m2} (ref >59)
Globulin, Total: 2.7 g/dL (ref 1.5–4.5)
Glucose: 106 mg/dL — ABNORMAL HIGH (ref 65–99)
Potassium: 4.2 mmol/L (ref 3.5–5.2)
Sodium: 138 mmol/L (ref 134–144)
Total Protein: 7.5 g/dL (ref 6.0–8.5)

## 2019-12-15 LAB — LIPID PANEL
Chol/HDL Ratio: 5 ratio (ref 0.0–5.0)
Cholesterol, Total: 170 mg/dL (ref 100–199)
HDL: 34 mg/dL — ABNORMAL LOW (ref >39)
LDL Chol Calc (NIH): 109 mg/dL — ABNORMAL HIGH (ref 0–99)
Triglycerides: 154 mg/dL — ABNORMAL HIGH (ref 0–149)
VLDL Cholesterol Cal: 27 mg/dL (ref 5–40)

## 2019-12-21 ENCOUNTER — Telehealth: Payer: Self-pay

## 2019-12-21 NOTE — Telephone Encounter (Signed)
-----   Message from Massie Maroon, [place] sent at 12/21/2019  5:57 AM EDT ----- Regarding: lab results Please inform patient that triglycerides have improved from 285-154 over the past 6 months.  His LDL cholesterol, which is considered "bad cholesterol" is mildly elevated at 109, goal is less than 100.  Remind him of the importance of continuing a low-fat, low carbohydrate diet divided over small meals throughout the day as discussed.  Increase water intake to around 32 ounces per day.  Also mild, low impact cardiovascular exercise several times per week.  Follow-up in office as scheduled.  Nolon Nations  APRN, MSN, FNP-C [facility] [address] [place], [place] [address] [phone]

## 2019-12-21 NOTE — Telephone Encounter (Signed)
Called and discussed labs with patient. No further questions

## 2019-12-28 ENCOUNTER — Other Ambulatory Visit: Payer: Self-pay | Admitting: Family Medicine

## 2019-12-28 DIAGNOSIS — E785 Hyperlipidemia, unspecified: Secondary | ICD-10-CM

## 2020-01-02 ENCOUNTER — Telehealth: Payer: Self-pay | Admitting: Family Medicine

## 2020-01-02 ENCOUNTER — Other Ambulatory Visit: Payer: Self-pay | Admitting: Family Medicine

## 2020-01-02 DIAGNOSIS — E785 Hyperlipidemia, unspecified: Secondary | ICD-10-CM

## 2020-01-02 MED ORDER — ATORVASTATIN CALCIUM 20 MG PO TABS: 20.0000 mg | ORAL_TABLET | Freq: Every day | ORAL | 3 refills | Status: DC

## 2020-01-02 NOTE — Progress Notes (Signed)
Meds ordered this encounter  ?Medications  ? atorvastatin (LIPITOR) 20 MG tablet  ?  Sig: Take 1 tablet (20 mg total) by mouth daily.  ?  Dispense:  90 tablet  ?  Refill:  3  ?  Order Specific Question:   Supervising Provider  ?  Answer:   JEGEDE, OLUGBEMIGA E [[id number]]  ?  ? ?Scott Buchanan Scott Harpenau  APRN, MSN, FNP-C ?[facility] ?[facility] ?[address]  ?[place], [place] [address] ?[phone] ? ?

## 2020-01-03 NOTE — Telephone Encounter (Signed)
Refill was sent into pharmacy on 01-02-2020

## 2020-01-10 ENCOUNTER — Other Ambulatory Visit: Payer: Self-pay | Admitting: Family Medicine

## 2020-01-10 DIAGNOSIS — I10 Essential (primary) hypertension: Secondary | ICD-10-CM

## 2020-01-16 ENCOUNTER — Ambulatory Visit
Admission: EM | Admit: 2020-01-16 | Discharge: 2020-01-16 | Disposition: A | Discharge status: Home / Self Care | Payer: PRIVATE HEALTH INSURANCE | Attending: Emergency Medicine | Admitting: Emergency Medicine

## 2020-01-16 ENCOUNTER — Other Ambulatory Visit: Payer: Self-pay

## 2020-01-16 DIAGNOSIS — H66002 Acute suppurative otitis media without spontaneous rupture of ear drum, left ear: Secondary | ICD-10-CM

## 2020-01-16 MED ORDER — AMOXICILLIN 875 MG PO TABS: 875.0000 mg | ORAL_TABLET | Freq: Two times a day (BID) | ORAL | 0 refills | Status: AC

## 2020-01-16 NOTE — Discharge Instructions (Addendum)
Take Amoxicillin twice daily for the next ten days.  

## 2020-01-16 NOTE — ED Provider Notes (Signed)
Emergency Department Provider Note  ____________________________________________  Time seen: Approximately 11:05 AM  I have reviewed the triage vital signs and the nursing notes.   HISTORY  Chief Complaint Otalgia   Historian Patient     HPI Scott Buchanan is a 54 y.o. male presents to the emergency for pain for the past 1 to 2 days.  Patient states that he typically has otitis media in the spring and fall and requires antibiotic.  Patient states he had to leave work today due to the pain.  He denies changes in hearing or discharge from the left ear.  States that he has had no pain with palpation of the tragus.  No other alleviating measures have been attempted.   Past Medical History:  Diagnosis Date  . Arthritis   . Asthma   . Diabetes mellitus without complication (HCC)    type 2  . Hypertension   . Primary localized osteoarthritis of right knee 03/02/2018     Immunizations up to date:  Yes.     Past Medical History:  Diagnosis Date  . Arthritis   . Asthma   . Diabetes mellitus without complication (HCC)    type 2  . Hypertension   . Primary localized osteoarthritis of right knee 03/02/2018    Patient Active Problem List   Diagnosis Date Noted  . Acute diarrhea 11/20/2019  . Change in bowel habits 11/20/2019  . Elevated LFTs 11/20/2019  . Fatty liver 11/20/2019  . History of esophagitis 11/20/2019  . Gastritis 03/11/2019  . Screening for viral disease 03/11/2019  . Musculoskeletal pain 03/11/2019  . Esophagitis 03/11/2019  . LUQ pain 03/11/2019  . Hav (hallux abducto valgus), right 12/21/2018  . Diabetes mellitus type 2 in obese (HCC) 05/14/2018  . Primary localized osteoarthritis of right knee 03/02/2018  . Status post right partial knee replacement 03/02/2018  . Morbid obesity (HCC) 07/27/2017  . Newly diagnosed diabetes (HCC) 05/12/2017  . Chronic frontal sinusitis 01/02/2017  . Essential hypertension 10/21/2016  . Hyperlipidemia LDL goal  <100 10/21/2016  . Elevated serum glucose 10/21/2016  . Vitamin D deficiency 10/21/2016  . Need for Tdap vaccination 10/21/2016    Past Surgical History:  Procedure Laterality Date  . CYSTECTOMY     tailbone  . ETHMOIDECTOMY Bilateral 04/07/2017   Procedure: BILATERAL ETHMOIDECTOMY AND SPHENOIDECTOMY;  Surgeon: Newman Pies, MD;  Location: [facility];  Service: ENT;  Laterality: Bilateral;  . KNEE ARTHROSCOPY W/ MENISCAL REPAIR     right and left knee one year apart  . PARTIAL KNEE ARTHROPLASTY Right 03/02/2018   Procedure: UNICOMPARTMENTAL KNEE;  Surgeon: Teryl Lucy, MD;  Location: [facility];  Service: Orthopedics;  Laterality: Right;  with block  . SINUS ENDO WITH FUSION Bilateral 04/07/2017   Procedure: BILATERAL FRONTAL RECESS SINUS EXPLORATION WITH SINUS FUSION NAVIGATION;  Surgeon: Newman Pies, MD;  Location: [facility];  Service: ENT;  Laterality: Bilateral;  . SINUS EXPLORATION    . TONSILLECTOMY    . TURBINATE REDUCTION Bilateral 04/07/2017   Procedure: BILATERAL TURBINATE REDUCTION;  Surgeon: Newman Pies, MD;  Location: [facility];  Service: ENT;  Laterality: Bilateral;  . unicompartmental right knee      Prior to Admission medications   Medication Sig Start Date End Date Taking? Authorizing Provider  albuterol (VENTOLIN HFA) 108 (90 Base) MCG/ACT inhaler Inhale 2 puffs into the lungs every 6 (six) hours as needed for wheezing or shortness of breath. 01/31/19   Quentin Angst,  MD  amLODipine (NORVASC) 10 MG tablet Take 1 tablet by mouth once daily 11/29/19   Massie Maroon, FNP  amoxicillin (AMOXIL) 875 MG tablet Take 1 tablet (875 mg total) by mouth 2 (two) times daily for 10 days. 01/16/20 01/26/20  Orvil Feil, PA-C  Ascorbic Acid (VITAMIN C) 100 MG tablet Take 100 mg by mouth daily.    [provider]  aspirin EC 81 MG tablet Take 81 mg by mouth daily.    [provider]  atorvastatin (LIPITOR) 20 MG tablet  Take 1 tablet (20 mg total) by mouth daily. 01/02/20   Massie Maroon, FNP  cholecalciferol (VITAMIN D) 1000 units tablet Take 1,000 Units by mouth daily.    [provider]  dicyclomine (BENTYL) 10 MG capsule Take 1 capsule (10 mg total) by mouth 4 (four) times daily -  before meals and at bedtime. 11/17/19   Mansouraty, Netty Starring., MD  Dulaglutide (TRULICITY) 0.75 MG/0.5ML SOPN Inject 0.75 mg into the skin once a week. 05/10/19   Massie Maroon, FNP  fluticasone (FLONASE) 50 MCG/ACT nasal spray Place 2 sprays into both nostrils daily.     [provider]  hydrochlorothiazide (HYDRODIURIL) 12.5 MG tablet Take 1 tablet by mouth once daily 01/11/20   Massie Maroon, FNP  levocetirizine (XYZAL) 5 MG tablet TAKE 1 TABLET BY MOUTH ONCE DAILY IN THE EVENING 11/29/19   Massie Maroon, FNP  metFORMIN (GLUCOPHAGE) 1000 MG tablet Take 1 tablet (1,000 mg total) by mouth 2 (two) times daily with a meal. Patient taking differently: Take 500 mg by mouth 2 (two) times daily with a meal.  11/22/18   Mike Gip, FNP  montelukast (SINGULAIR) 10 MG tablet TAKE 1 TABLET BY MOUTH AT BEDTIME 11/29/19   Massie Maroon, FNP  omeprazole (PRILOSEC) 40 MG capsule Take 1 capsule by mouth once daily 11/17/19   Mansouraty, Netty Starring., MD  tamsulosin (FLOMAX) 0.4 MG CAPS capsule Take 2 capsules (0.8 mg total) by mouth daily. 07/11/19   Stoioff, Verna Czech, MD    Allergies Patient has no known allergies.  Family History  Problem Relation Age of Onset  . Hypertension Mother   . Colon cancer Neg Hx   . Colon polyps Neg Hx   . Stomach cancer Neg Hx   . Rectal cancer Neg Hx   . Esophageal cancer Neg Hx   . Inflammatory bowel disease Neg Hx   . Liver disease Neg Hx   . Pancreatic cancer Neg Hx     Social History Social History   Tobacco Use  . Smoking status: Former Smoker    Types: Cigarettes  . Smokeless tobacco: Never Used  . Tobacco comment: quit 10 years ago  Vaping Use  . Vaping  Use: Never used  Substance Use Topics  . Alcohol use: No  . Drug use: No     Review of Systems  Constitutional: No fever/chills Eyes:  No discharge ENT: Patient has left ear pain.  Respiratory: no cough. No SOB/ use of accessory muscles to breath Gastrointestinal:   No nausea, no vomiting.  No diarrhea.  No constipation. Musculoskeletal: Negative for musculoskeletal pain. Skin: Negative for rash, abrasions, lacerations, ecchymosis.    ____________________________________________   PHYSICAL EXAM:  VITAL SIGNS: ED Triage Vitals  Enc Vitals Group     BP 01/16/20 1040 (!) 152/103     Pulse Rate 01/16/20 1040 86     Resp 01/16/20 1040 16     Temp  01/16/20 1040 98.2 F (36.8 C)     Temp Source 01/16/20 1040 Oral     SpO2 01/16/20 1040 95 %     Weight --      Height --      Head Circumference --      Peak Flow --      Pain Score 01/16/20 1049 5     Pain Loc --      Pain Edu? --      Excl. in GC? --      Constitutional: Alert and oriented. Well appearing and in no acute distress. Eyes: Conjunctivae are normal. PERRL. EOMI. Head: Atraumatic. ENT:      Ears: No pain to palpation of the left tragus.  There is evidence of purulent exudate behind TM.  No frank erythema of the TM.      Nose: No congestion/rhinnorhea.      Mouth/Throat: Mucous membranes are moist.  Cardiovascular: Normal rate, regular rhythm. Normal S1 and S2.  Good peripheral circulation. Respiratory: Normal respiratory effort without tachypnea or retractions. Lungs CTAB. Good air entry to the bases with no decreased or absent breath sounds Musculoskeletal: Full range of motion to all extremities. No obvious deformities noted Neurologic:  Normal for age. No gross focal neurologic deficits are appreciated.  Skin:  Skin is warm, dry and intact. No rash noted. Psychiatric: Mood and affect are normal for age. Speech and behavior are normal.   ____________________________________________   LABS (all labs  ordered are listed, but only abnormal results are displayed)  Labs Reviewed - No data to display ____________________________________________  EKG   ____________________________________________  RADIOLOGY  No results found.  ____________________________________________    PROCEDURES  Procedure(s) performed:     Procedures     Medications - No data to display   ____________________________________________   INITIAL IMPRESSION / ASSESSMENT AND PLAN / ED COURSE  Pertinent labs & imaging results that were available during my care of the patient were reviewed by me and considered in my medical decision making (see chart for details).      Assessment and plan Otitis media 54 year old male presents to the emergency department with left ear pain for the past 2 to 3 days.  Patient was hypertensive at triage and states that this is more or less his baseline.  I recommended him following up with primary care regarding hypertension.  He was sent home with amoxicillin to be taken twice daily for the next 10 days.  Return precautions were given to return with new or worsening symptoms.     ____________________________________________  FINAL CLINICAL IMPRESSION(S) / ED DIAGNOSES  Final diagnoses:  Non-recurrent acute suppurative otitis media of left ear without spontaneous rupture of tympanic membrane      NEW MEDICATIONS STARTED DURING THIS VISIT:  ED Discharge Orders         Ordered    amoxicillin (AMOXIL) 875 MG tablet  2 times daily        01/16/20 1100              This chart was dictated using voice recognition software/Dragon. Despite best efforts to proofread, errors can occur which can change the meaning. Any change was purely unintentional. '   Orvil Feil, [place] 01/16/20 1108

## 2020-01-16 NOTE — ED Triage Notes (Signed)
Pt c/o lt ear pain. States some nasal congestion. Denies need of covid testing.

## 2020-01-30 ENCOUNTER — Other Ambulatory Visit: Payer: Self-pay | Admitting: Family Medicine

## 2020-01-30 DIAGNOSIS — E1169 Type 2 diabetes mellitus with other specified complication: Secondary | ICD-10-CM

## 2020-01-31 ENCOUNTER — Other Ambulatory Visit: Payer: Self-pay | Admitting: Family Medicine

## 2020-01-31 ENCOUNTER — Telehealth: Payer: Self-pay | Admitting: Family Medicine

## 2020-01-31 DIAGNOSIS — E1169 Type 2 diabetes mellitus with other specified complication: Secondary | ICD-10-CM

## 2020-01-31 MED ORDER — METFORMIN HCL 1000 MG PO TABS: 500.0000 mg | ORAL_TABLET | Freq: Two times a day (BID) | ORAL | 1 refills | Status: DC

## 2020-01-31 NOTE — Progress Notes (Signed)
Meds ordered this encounter  Medications   metFORMIN (GLUCOPHAGE) 1000 MG tablet    Sig: Take 0.5 tablets (500 mg total) by mouth 2 (two) times daily with a meal.    Dispense:  180 tablet    Refill:  1    Order Specific Question:   Supervising Provider    Answer:   Quentin Angst [[id number]]

## 2020-02-01 NOTE — Telephone Encounter (Signed)
Done

## 2020-02-27 ENCOUNTER — Other Ambulatory Visit: Payer: Self-pay | Admitting: Family Medicine

## 2020-02-27 DIAGNOSIS — I10 Essential (primary) hypertension: Secondary | ICD-10-CM

## 2020-02-27 DIAGNOSIS — Z9109 Other allergy status, other than to drugs and biological substances: Secondary | ICD-10-CM

## 2020-02-27 DIAGNOSIS — J3089 Other allergic rhinitis: Secondary | ICD-10-CM

## 2020-02-27 NOTE — Telephone Encounter (Signed)
Please see 3 refill requests

## 2020-03-13 ENCOUNTER — Other Ambulatory Visit: Payer: Self-pay

## 2020-03-13 ENCOUNTER — Ambulatory Visit (INDEPENDENT_AMBULATORY_CARE_PROVIDER_SITE_OTHER): Payer: PRIVATE HEALTH INSURANCE | Admitting: Family Medicine

## 2020-03-13 ENCOUNTER — Encounter: Payer: Self-pay | Admitting: Family Medicine

## 2020-03-13 VITALS — BP 152/89 | HR 79 | Temp 98.1°F | Resp 20 | Ht 75.5 in | Wt 334.2 lb

## 2020-03-13 DIAGNOSIS — Z23 Encounter for immunization: Secondary | ICD-10-CM

## 2020-03-13 DIAGNOSIS — E669 Obesity, unspecified: Secondary | ICD-10-CM

## 2020-03-13 DIAGNOSIS — E1169 Type 2 diabetes mellitus with other specified complication: Secondary | ICD-10-CM

## 2020-03-13 DIAGNOSIS — E785 Hyperlipidemia, unspecified: Secondary | ICD-10-CM | POA: Diagnosis not present

## 2020-03-13 DIAGNOSIS — I10 Essential (primary) hypertension: Secondary | ICD-10-CM | POA: Diagnosis not present

## 2020-03-13 DIAGNOSIS — Z0000 Encounter for general adult medical examination without abnormal findings: Secondary | ICD-10-CM

## 2020-03-13 DIAGNOSIS — E559 Vitamin D deficiency, unspecified: Secondary | ICD-10-CM

## 2020-03-13 LAB — POCT URINALYSIS DIP (CLINITEK)
Bilirubin, UA: NEGATIVE
Blood, UA: NEGATIVE
Glucose, UA: NEGATIVE mg/dL
Ketones, POC UA: NEGATIVE mg/dL
Leukocytes, UA: NEGATIVE
Nitrite, UA: NEGATIVE
POC PROTEIN,UA: NEGATIVE
Spec Grav, UA: 1.02 (ref 1.010–1.025)
Urobilinogen, UA: 0.2 E.U./dL
pH, UA: 6 (ref 5.0–8.0)

## 2020-03-13 LAB — POCT GLYCOSYLATED HEMOGLOBIN (HGB A1C): Hemoglobin A1C: 6.2 % — AB (ref 4.0–5.6)

## 2020-03-13 LAB — POCT CBG (FASTING - GLUCOSE)-MANUAL ENTRY: Glucose Fasting, POC: 117 mg/dL — AB (ref 70–99)

## 2020-03-13 NOTE — Progress Notes (Signed)
[facility]   Established Patient Office Visit  Subjective:  Patient ID: Scott Buchanan, male    DOB: 06-04-1965  Age: 54 y.o. MRN: [id number]  CC:  Chief Complaint  Patient presents with  . Follow-up    HPI Scott Buchanan is a 54 year old male with a medical history significant for type 2 diabetes mellitus, essential hypertension, hyperlipidemia, morbid obesity, and history of mild intermittent asthma presents for annual physical exam.  Patient says that he has been doing well and is without complaint.  He has not been following a low-fat, low carbohydrate diet over the past several weeks.  He typically does morning fasting glucose which is ranged from 120-140.  Patient has continued antidiabetic medications without interruption.  He does not check blood pressure at home.  He periodically checks in pharmacy. Patient is up-to-date with vaccinations.  He has been fully vaccinated against COVID-19 infection.  Patient is up-to-date with colonoscopy and prostate exam.  He is followed by gastroenterology. Patient is not sexually active.  He is a former smoker.  He generally does not wear sunscreen.  Past Medical History:  Diagnosis Date  . Arthritis   . Asthma   . Diabetes mellitus without complication (HCC)    type 2  . Hypertension   . Primary localized osteoarthritis of right knee 03/02/2018    Past Surgical History:  Procedure Laterality Date  . CYSTECTOMY     tailbone  . ETHMOIDECTOMY Bilateral 04/07/2017   Procedure: BILATERAL ETHMOIDECTOMY AND SPHENOIDECTOMY;  Surgeon: Newman Pies, MD;  Location: [facility];  Service: ENT;  Laterality: Bilateral;  . KNEE ARTHROSCOPY W/ MENISCAL REPAIR     right and left knee one year apart  . PARTIAL KNEE ARTHROPLASTY Right 03/02/2018   Procedure: UNICOMPARTMENTAL KNEE;  Surgeon: Teryl Lucy, MD;  Location: [facility];  Service: Orthopedics;  Laterality: Right;  with block  . SINUS ENDO  WITH FUSION Bilateral 04/07/2017   Procedure: BILATERAL FRONTAL RECESS SINUS EXPLORATION WITH SINUS FUSION NAVIGATION;  Surgeon: Newman Pies, MD;  Location: [facility];  Service: ENT;  Laterality: Bilateral;  . SINUS EXPLORATION    . TONSILLECTOMY    . TURBINATE REDUCTION Bilateral 04/07/2017   Procedure: BILATERAL TURBINATE REDUCTION;  Surgeon: Newman Pies, MD;  Location: [facility];  Service: ENT;  Laterality: Bilateral;  . unicompartmental right knee      Family History  Problem Relation Age of Onset  . Hypertension Mother   . Colon cancer Neg Hx   . Colon polyps Neg Hx   . Stomach cancer Neg Hx   . Rectal cancer Neg Hx   . Esophageal cancer Neg Hx   . Inflammatory bowel disease Neg Hx   . Liver disease Neg Hx   . Pancreatic cancer Neg Hx     Social History   Socioeconomic History  . Marital status: Single    Spouse name: Not on file  . Number of children: 1  . Years of education: Not on file  . Highest education level: Not on file  Occupational History  . Occupation: maintance   Tobacco Use  . Smoking status: Former Smoker    Types: Cigarettes  . Smokeless tobacco: Never Used  . Tobacco comment: quit 10 years ago  Vaping Use  . Vaping Use: Never used  Substance and Sexual Activity  . Alcohol use: No  . Drug use: No  . Sexual activity: Yes  Other Topics Concern  .  Not on file  Social History Narrative  . Not on file   Social Determinants of Health   Financial Resource Strain:   . Difficulty of Paying Living Expenses: Not on file  Food Insecurity:   . Worried About Programme researcher, broadcasting/film/video in the Last Year: Not on file  . Ran Out of Food in the Last Year: Not on file  Transportation Needs:   . Lack of Transportation (Medical): Not on file  . Lack of Transportation (Non-Medical): Not on file  Physical Activity:   . Days of Exercise per Week: Not on file  . Minutes of Exercise per Session: Not on file  Stress:   . Feeling of Stress :  Not on file  Social Connections:   . Frequency of Communication with Friends and Family: Not on file  . Frequency of Social Gatherings with Friends and Family: Not on file  . Attends Religious Services: Not on file  . Active Member of Clubs or Organizations: Not on file  . Attends Banker Meetings: Not on file  . Marital Status: Not on file  Intimate Partner Violence:   . Fear of Current or Ex-Partner: Not on file  . Emotionally Abused: Not on file  . Physically Abused: Not on file  . Sexually Abused: Not on file    Outpatient Medications Prior to Visit  Medication Sig Dispense Refill  . albuterol (VENTOLIN HFA) 108 (90 Base) MCG/ACT inhaler Inhale 2 puffs into the lungs every 6 (six) hours as needed for wheezing or shortness of breath. 18 g 3  . amLODipine (NORVASC) 10 MG tablet Take 1 tablet by mouth once daily 90 tablet 0  . Ascorbic Acid (VITAMIN C) 100 MG tablet Take 100 mg by mouth daily.    Marland Kitchen aspirin EC 81 MG tablet Take 81 mg by mouth daily.    Marland Kitchen atorvastatin (LIPITOR) 20 MG tablet Take 1 tablet (20 mg total) by mouth daily. 90 tablet 3  . cholecalciferol (VITAMIN D) 1000 units tablet Take 1,000 Units by mouth daily.    Marland Kitchen dicyclomine (BENTYL) 10 MG capsule Take 1 capsule (10 mg total) by mouth 4 (four) times daily -  before meals and at bedtime. 60 capsule 1  . Dulaglutide (TRULICITY) 0.75 MG/0.5ML SOPN Inject 0.75 mg into the skin once a week. 0.5 mL 12  . fluticasone (FLONASE) 50 MCG/ACT nasal spray Place 2 sprays into both nostrils daily.     . hydrochlorothiazide (HYDRODIURIL) 12.5 MG tablet Take 1 tablet by mouth once daily 90 tablet 0  . levocetirizine (XYZAL) 5 MG tablet TAKE 1 TABLET BY MOUTH ONCE DAILY IN THE EVENING 90 tablet 0  . metFORMIN (GLUCOPHAGE) 1000 MG tablet Take 0.5 tablets (500 mg total) by mouth 2 (two) times daily with a meal. 180 tablet 1  . montelukast (SINGULAIR) 10 MG tablet TAKE 1 TABLET BY MOUTH AT BEDTIME 90 tablet 0  . omeprazole  (PRILOSEC) 40 MG capsule Take 1 capsule by mouth once daily 30 capsule 11  . tamsulosin (FLOMAX) 0.4 MG CAPS capsule Take 2 capsules (0.8 mg total) by mouth daily. (Patient not taking: Reported on 03/13/2020) 180 capsule 3   Facility-Administered Medications Prior to Visit  Medication Dose Route Frequency Provider Last Rate Last Admin  . 0.9 %  sodium chloride infusion  500 mL Intravenous Once Mansouraty, Netty Starring., MD        No Known Allergies  ROS Review of Systems  Constitutional: Negative for activity change and appetite  change.  HENT: Negative.   Eyes: Negative.   Respiratory: Negative.   Cardiovascular: Negative.   Gastrointestinal: Negative.   Endocrine: Negative for polydipsia, polyphagia and polyuria.  Genitourinary: Negative.   Musculoskeletal: Negative.   Skin: Negative.   Neurological: Negative.   Hematological: Negative.   Psychiatric/Behavioral: Negative.       Objective:    Physical Exam Constitutional:      Appearance: Normal appearance.  HENT:     Head: Normocephalic.     Nose: Nose normal.  Eyes:     Pupils: Pupils are equal, round, and reactive to light.  Cardiovascular:     Rate and Rhythm: Normal rate and regular rhythm.     Pulses: Normal pulses.     Heart sounds: Normal heart sounds.  Pulmonary:     Effort: Pulmonary effort is normal.  Abdominal:     General: Abdomen is flat. Bowel sounds are normal.  Musculoskeletal:        General: Normal range of motion.  Neurological:     General: No focal deficit present.     Mental Status: He is alert. Mental status is at baseline.  Psychiatric:        Mood and Affect: Mood normal.        Behavior: Behavior normal.        Thought Content: Thought content normal.        Judgment: Judgment normal.     BP (!) 152/89 (BP Location: Left Arm, Patient Position: Sitting, Cuff Size: Large)   Pulse 79   Temp 98.1 F (36.7 C) (Temporal)   Resp 20   Ht 6' 3.5" (1.918 m)   Wt (!) 334 lb 3.2 oz (151.6  kg)   SpO2 99%   BMI 41.22 kg/m  Wt Readings from Last 3 Encounters:  03/13/20 (!) 334 lb 3.2 oz (151.6 kg)  12/13/19 (!) 329 lb (149.2 kg)  11/17/19 (!) 323 lb 4 oz (146.6 kg)   Social History   Socioeconomic History  . Marital status: Single    Spouse name: Not on file  . Number of children: 1  . Years of education: Not on file  . Highest education level: Not on file  Occupational History  . Occupation: maintance   Tobacco Use  . Smoking status: Former Smoker    Types: Cigarettes  . Smokeless tobacco: Never Used  . Tobacco comment: quit 10 years ago  Vaping Use  . Vaping Use: Never used  Substance and Sexual Activity  . Alcohol use: No  . Drug use: No  . Sexual activity: Yes  Other Topics Concern  . Not on file  Social History Narrative  . Not on file   Social Determinants of Health   Financial Resource Strain:   . Difficulty of Paying Living Expenses: Not on file  Food Insecurity:   . Worried About Programme researcher, broadcasting/film/videounning Out of Food in the Last Year: Not on file  . Ran Out of Food in the Last Year: Not on file  Transportation Needs:   . Lack of Transportation (Medical): Not on file  . Lack of Transportation (Non-Medical): Not on file  Physical Activity:   . Days of Exercise per Week: Not on file  . Minutes of Exercise per Session: Not on file  Stress:   . Feeling of Stress : Not on file  Social Connections:   . Frequency of Communication with Friends and Family: Not on file  . Frequency of Social Gatherings with Friends and Family: Not  on file  . Attends Religious Services: Not on file  . Active Member of Clubs or Organizations: Not on file  . Attends Banker Meetings: Not on file  . Marital Status: Not on file  Intimate Partner Violence:   . Fear of Current or Ex-Partner: Not on file  . Emotionally Abused: Not on file  . Physically Abused: Not on file  . Sexually Abused: Not on file   Immunization History  Administered Date(s) Administered  .  Influenza,inj,Quad PF,6+ Mos 02/05/2018  . PFIZER SARS-COV-2 Vaccination 10/26/2019, 11/16/2019  . Pneumococcal Polysaccharide-23 04/27/2017  . Tdap 10/21/2016    Screening Tests Health Maintenance  Topic Date Due  . OPHTHALMOLOGY EXAM  04/24/2020  . HEMOGLOBIN A1C  09/10/2020  . URINE MICROALBUMIN  09/12/2020  . FOOT EXAM  03/13/2021  . TETANUS/TDAP  10/22/2026  . COLONOSCOPY  11/06/2027  . PNEUMOCOCCAL POLYSACCHARIDE VACCINE AGE 66-64 HIGH RISK  Completed  . COVID-19 Vaccine  Completed  . Hepatitis C Screening  Completed  . HIV Screening  Completed    Immunization History  Administered Date(s) Administered  . Influenza,inj,Quad PF,6+ Mos 02/05/2018, 03/13/2020  . PFIZER SARS-COV-2 Vaccination 10/26/2019, 11/16/2019  . Pneumococcal Polysaccharide-23 04/27/2017  . Tdap 10/21/2016         Lab Results  Component Value Date   TSH 2.430 11/09/2017   Lab Results  Component Value Date   WBC 14.4 (H) 03/03/2018   HGB 13.0 03/03/2018   HCT 43.6 12/13/2019   MCV 84.2 03/03/2018   PLT 353 03/03/2018   Lab Results  Component Value Date   NA 138 12/13/2019   K 4.2 12/13/2019   CO2 19 (L) 12/13/2019   GLUCOSE 106 (H) 12/13/2019   BUN 12 12/13/2019   CREATININE 0.90 12/13/2019   BILITOT 0.5 12/13/2019   ALKPHOS 56 12/13/2019   AST 39 12/13/2019   ALT 55 (H) 12/13/2019   PROT 7.5 12/13/2019   ALBUMIN 4.8 12/13/2019   CALCIUM 10.7 (H) 12/13/2019   ANIONGAP 10 03/03/2018   Lab Results  Component Value Date   CHOL 170 12/13/2019   Lab Results  Component Value Date   HDL 34 (L) 12/13/2019   Lab Results  Component Value Date   LDLCALC 109 (H) 12/13/2019   Lab Results  Component Value Date   TRIG 154 (H) 12/13/2019   Lab Results  Component Value Date   CHOLHDL 5.0 12/13/2019   Lab Results  Component Value Date   HGBA1C 6.2 (A) 03/13/2020      Assessment & Plan:   Problem List Items Addressed This Visit      Cardiovascular and Mediastinum    Essential hypertension   Relevant Orders   POCT URINALYSIS DIP (CLINITEK) (Completed)     Endocrine   Diabetes mellitus type 2 in obese (HCC) - Primary   Relevant Orders   Glucose (CBG), Fasting (Completed)   HgB A1c (Completed)   POCT URINALYSIS DIP (CLINITEK) (Completed)       Annual physical exam Recommended repeat physical exam in 1 year EKG shows normal sinus rhythm Patient has moderate obesity.  Recommend low-fat, low carbohydrate diet divided over small meals throughout the day. Also, 150 minutes of low impact cardiovascular activity per week. Will review laboratory values as results become available. Urinalysis unremarkable Fasting glucose is 117.  Goal is between 110-140 Hemoglobin A1c is 6.2, which is at goal.  Goal is less than 7.0  - CBC with Differential - Comprehensive metabolic panel - TSH -  EKG 12-Lead - Lipid Panel   Diabetes mellitus type 2 in obese (HCC)  - Glucose (CBG), Fasting - HgB A1c - POCT URINALYSIS DIP (CLINITEK)  Essential hypertension BP (!) 152/89 (BP Location: Left Arm, Patient Position: Sitting, Cuff Size: Large)   Pulse 79   Temp 98.1 F (36.7 C) (Temporal)   Resp 20   Ht 6' 3.5" (1.918 m)   Wt (!) 334 lb 3.2 oz (151.6 kg)   SpO2 99%   BMI 41.22 kg/m  - POCT URINALYSIS DIP (CLINITEK)  Hyperlipidemia LDL goal <100 The 10-year ASCVD risk score Denman [name] DC Jr., et al., 2013) is: 14.6%   Values used to calculate the score:     Age: 89 years     Sex: Male     Is Non-Hispanic African American: No     Diabetic: Yes     Tobacco smoker: No     Systolic Blood Pressure: 152 mmHg     Is BP treated: Yes     HDL Cholesterol: 34 mg/dL     Total Cholesterol: 170 mg/dL   Vitamin D deficiency Review vitamin D deficiency as results become available   Follow-up: Return in about 3 months (around 06/13/2020) for obesity, diabetes, hypertension.    Nolon Nations  APRN, MSN, FNP-C [facility] [address] [place], [place] [address] [phone]

## 2020-03-13 NOTE — Patient Instructions (Addendum)
Today, we completed your annual physical exam.  EKG is consistent with your baseline.  We will follow-up by phone with any lab results.  Please review health maintenance for males age 52-64.  Recommend routine prostate exam.  You are up-to-date with colonoscopy.  Also, your immunizations are up-to-date.  Recommend a daily multivitamin for men (Centrum Silver or One-A-Day for men).  Please continue all prescribed medications.  Recommend a low-fat, low carbohydrate diet divided over small meals throughout the day.  Health Maintenance, Male Adopting a healthy lifestyle and getting preventive care are important in promoting health and wellness. Ask your health care provider about:  The right schedule for you to have regular tests and exams.  Things you can do on your own to prevent diseases and keep yourself healthy. What should I know about diet, weight, and exercise? Eat a healthy diet   Eat a diet that includes plenty of vegetables, fruits, low-fat dairy products, and lean protein.  Do not eat a lot of foods that are high in solid fats, added sugars, or sodium. Maintain a healthy weight Body mass index (BMI) is a measurement that can be used to identify possible weight problems. It estimates body fat based on height and weight. Your health care provider can help determine your BMI and help you achieve or maintain a healthy weight. Get regular exercise Get regular exercise. This is one of the most important things you can do for your health. Most adults should:  Exercise for at least 150 minutes each week. The exercise should increase your heart rate and make you sweat (moderate-intensity exercise).  Do strengthening exercises at least twice a week. This is in addition to the moderate-intensity exercise.  Spend less time sitting. Even light physical activity can be beneficial. Watch cholesterol and blood lipids Have your blood tested for lipids and cholesterol at 52 years of age, then have  this test every 5 years. You may need to have your cholesterol levels checked more often if:  Your lipid or cholesterol levels are high.  You are older than 52 years of age.  You are at high risk for heart disease. What should I know about cancer screening? Many types of cancers can be detected early and may often be prevented. Depending on your health history and family history, you may need to have cancer screening at various ages. This may include screening for:  Colorectal cancer.  Prostate cancer.  Skin cancer.  Lung cancer. What should I know about heart disease, diabetes, and high blood pressure? Blood pressure and heart disease  High blood pressure causes heart disease and increases the risk of stroke. This is more likely to develop in people who have high blood pressure readings, are of African descent, or are overweight.  Talk with your health care provider about your target blood pressure readings.  Have your blood pressure checked: ? Every 3-5 years if you are 37-49 years of age. ? Every year if you are 58 years old or older.  If you are between the ages of 65 and 65 and are a current or former smoker, ask your health care provider if you should have a one-time screening for abdominal aortic aneurysm (AAA). Diabetes Have regular diabetes screenings. This checks your fasting blood sugar level. Have the screening done:  Once every three years after age 99 if you are at a normal weight and have a low risk for diabetes.  More often and at a younger age if you are overweight  or have a high risk for diabetes. What should I know about preventing infection? Hepatitis B If you have a higher risk for hepatitis B, you should be screened for this virus. Talk with your health care provider to find out if you are at risk for hepatitis B infection. Hepatitis C Blood testing is recommended for:  Everyone born from [birth year] through 1963.  Anyone with known risk factors for  hepatitis C. Sexually transmitted infections (STIs)  You should be screened each year for STIs, including gonorrhea and chlamydia, if: ? You are sexually active and are younger than 52 years of age. ? You are older than 52 years of age and your health care provider tells you that you are at risk for this type of infection. ? Your sexual activity has changed since you were last screened, and you are at increased risk for chlamydia or gonorrhea. Ask your health care provider if you are at risk.  Ask your health care provider about whether you are at high risk for HIV. Your health care provider may recommend a prescription medicine to help prevent HIV infection. If you choose to take medicine to prevent HIV, you should first get tested for HIV. You should then be tested every 3 months for as long as you are taking the medicine. Follow these instructions at home: Lifestyle  Do not use any products that contain nicotine or tobacco, such as cigarettes, e-cigarettes, and chewing tobacco. If you need help quitting, ask your health care provider.  Do not use street drugs.  Do not share needles.  Ask your health care provider for help if you need support or information about quitting drugs. Alcohol use  Do not drink alcohol if your health care provider tells you not to drink.  If you drink alcohol: ? Limit how much you have to 0-2 drinks a day. ? Be aware of how much alcohol is in your drink. In the [place] one drink equals one 12 oz bottle of beer (355 mL), one 5 oz glass of wine (148 mL), or one 1 oz glass of hard liquor (44 mL). General instructions  Schedule regular health, dental, and eye exams.  Stay current with your vaccines.  Tell your health care provider if: ? You often feel depressed. ? You have ever been abused or do not feel safe at home. Summary  Adopting a healthy lifestyle and getting preventive care are important in promoting health and wellness.  Follow your health care  provider's instructions about healthy diet, exercising, and getting tested or screened for diseases.  Follow your health care provider's instructions on monitoring your cholesterol and blood pressure. This information is not intended to replace advice given to you by your health care provider. Make sure you discuss any questions you have with your health care provider. Document Revised: 03/31/2018 Document Reviewed: 03/31/2018 Elsevier Patient Education  2020 [employer].

## 2020-03-13 NOTE — Progress Notes (Signed)
Needs to discuss physical/labs for insurance, needs proof to keep premiums from increasing  Wants to discuss metformin dose, having higher readings in the AM since cutting dose in half

## 2020-03-14 LAB — VITAMIN D 25 HYDROXY (VIT D DEFICIENCY, FRACTURES): Vit D, 25-Hydroxy: 30 ng/mL (ref 30.0–100.0)

## 2020-03-14 LAB — CBC WITH DIFFERENTIAL/PLATELET
Basophils Absolute: 0.1 10*3/uL (ref 0.0–0.2)
Basos: 1 %
EOS (ABSOLUTE): 0.2 10*3/uL (ref 0.0–0.4)
Eos: 3 %
Hematocrit: 42.9 % (ref 37.5–51.0)
Hemoglobin: 14.7 g/dL (ref 13.0–17.7)
Immature Grans (Abs): 0 10*3/uL (ref 0.0–0.1)
Immature Granulocytes: 0 %
Lymphocytes Absolute: 2.8 10*3/uL (ref 0.7–3.1)
Lymphs: 41 %
MCH: 29.3 pg (ref 26.6–33.0)
MCHC: 34.3 g/dL (ref 31.5–35.7)
MCV: 86 fL (ref 79–97)
Monocytes Absolute: 0.5 10*3/uL (ref 0.1–0.9)
Monocytes: 8 %
Neutrophils Absolute: 3.3 10*3/uL (ref 1.4–7.0)
Neutrophils: 47 %
Platelets: 341 10*3/uL (ref 150–450)
RBC: 5.01 x10E6/uL (ref 4.14–5.80)
RDW: 13.5 % (ref 11.6–15.4)
WBC: 7 10*3/uL (ref 3.4–10.8)

## 2020-03-14 LAB — COMPREHENSIVE METABOLIC PANEL
ALT: 58 IU/L — ABNORMAL HIGH (ref 0–44)
AST: 42 IU/L — ABNORMAL HIGH (ref 0–40)
Albumin/Globulin Ratio: 1.8 (ref 1.2–2.2)
Albumin: 4.8 g/dL (ref 3.8–4.9)
Alkaline Phosphatase: 59 IU/L (ref 44–121)
BUN/Creatinine Ratio: 16 (ref 9–20)
BUN: 15 mg/dL (ref 6–24)
Bilirubin Total: 0.5 mg/dL (ref 0.0–1.2)
CO2: 23 mmol/L (ref 20–29)
Calcium: 10.2 mg/dL (ref 8.7–10.2)
Chloride: 102 mmol/L (ref 96–106)
Creatinine, Ser: 0.92 mg/dL (ref 0.76–1.27)
GFR calc Af Amer: 110 mL/min/{1.73_m2} (ref >59)
GFR calc non Af Amer: 95 mL/min/{1.73_m2} (ref >59)
Globulin, Total: 2.7 g/dL (ref 1.5–4.5)
Glucose: 117 mg/dL — ABNORMAL HIGH (ref 65–99)
Potassium: 4.3 mmol/L (ref 3.5–5.2)
Sodium: 144 mmol/L (ref 134–144)
Total Protein: 7.5 g/dL (ref 6.0–8.5)

## 2020-03-14 LAB — LIPID PANEL
Chol/HDL Ratio: 4.8 ratio (ref 0.0–5.0)
Cholesterol, Total: 177 mg/dL (ref 100–199)
HDL: 37 mg/dL — ABNORMAL LOW (ref >39)
LDL Chol Calc (NIH): 117 mg/dL — ABNORMAL HIGH (ref 0–99)
Triglycerides: 130 mg/dL (ref 0–149)
VLDL Cholesterol Cal: 23 mg/dL (ref 5–40)

## 2020-03-14 LAB — TSH: TSH: 1.27 u[IU]/mL (ref 0.450–4.500)

## 2020-03-20 ENCOUNTER — Other Ambulatory Visit: Payer: Self-pay | Admitting: Family Medicine

## 2020-03-20 DIAGNOSIS — G629 Polyneuropathy, unspecified: Secondary | ICD-10-CM

## 2020-03-22 ENCOUNTER — Telehealth: Payer: Self-pay

## 2020-03-22 NOTE — Telephone Encounter (Signed)
Pt aware of results and voiced understanding, all questions answered/concerns addressed.  

## 2020-03-22 NOTE — Telephone Encounter (Signed)
-----   Message from Massie Maroon, [place] sent at 03/20/2020  4:57 PM EST ----- Regarding: lab results Please inform patient LDL cholesterol is elevated at 117, goal is less than 100.  Advised to follow-up a low-fat, low carbohydrate diet divided over small meals throughout the day.  Also, increase daily fitness routines.  Low impact cardiovascular exercise at about 150 minutes/week.Nolon Nations  APRN, MSN, FNP-C [facility] [address] [place], [place] [address] [phone]

## 2020-03-23 ENCOUNTER — Other Ambulatory Visit: Payer: Self-pay | Admitting: Family Medicine

## 2020-03-23 DIAGNOSIS — G588 Other specified mononeuropathies: Secondary | ICD-10-CM

## 2020-03-23 MED ORDER — GABAPENTIN 300 MG PO CAPS: 300.0000 mg | ORAL_CAPSULE | Freq: Every day | ORAL | 1 refills | Status: DC

## 2020-03-23 NOTE — Progress Notes (Signed)
Meds ordered this encounter  Medications  . gabapentin (NEURONTIN) 300 MG capsule    Sig: Take 1 capsule (300 mg total) by mouth at bedtime.    Dispense:  90 capsule    Refill:  1    Order Specific Question:   Supervising Provider    Answer:   Quentin Angst [[id number]]    Nolon Nations  APRN, MSN, FNP-C [facility] [address] [place], [place] [address] [phone]

## 2020-04-30 ENCOUNTER — Other Ambulatory Visit: Payer: Self-pay | Admitting: Family Medicine

## 2020-04-30 DIAGNOSIS — E669 Obesity, unspecified: Secondary | ICD-10-CM

## 2020-04-30 DIAGNOSIS — E1169 Type 2 diabetes mellitus with other specified complication: Secondary | ICD-10-CM

## 2020-04-30 DIAGNOSIS — I10 Essential (primary) hypertension: Secondary | ICD-10-CM

## 2020-05-10 ENCOUNTER — Other Ambulatory Visit: Payer: Self-pay | Admitting: Family Medicine

## 2020-05-10 DIAGNOSIS — I10 Essential (primary) hypertension: Secondary | ICD-10-CM

## 2020-05-29 ENCOUNTER — Other Ambulatory Visit: Payer: Self-pay | Admitting: Family Medicine

## 2020-05-29 DIAGNOSIS — Z9109 Other allergy status, other than to drugs and biological substances: Secondary | ICD-10-CM

## 2020-05-29 DIAGNOSIS — J3089 Other allergic rhinitis: Secondary | ICD-10-CM

## 2020-06-11 ENCOUNTER — Emergency Department (HOSPITAL_COMMUNITY): Payer: PRIVATE HEALTH INSURANCE

## 2020-06-11 ENCOUNTER — Other Ambulatory Visit: Payer: Self-pay | Admitting: Cardiology

## 2020-06-11 ENCOUNTER — Emergency Department (HOSPITAL_COMMUNITY)
Admission: EM | Admit: 2020-06-11 | Discharge: 2020-06-11 | Disposition: A | Discharge status: Home / Self Care | Payer: PRIVATE HEALTH INSURANCE | Attending: Emergency Medicine | Admitting: Emergency Medicine

## 2020-06-11 DIAGNOSIS — Z7984 Long term (current) use of oral hypoglycemic drugs: Secondary | ICD-10-CM | POA: Insufficient documentation

## 2020-06-11 DIAGNOSIS — Z7982 Long term (current) use of aspirin: Secondary | ICD-10-CM | POA: Diagnosis not present

## 2020-06-11 DIAGNOSIS — Z794 Long term (current) use of insulin: Secondary | ICD-10-CM | POA: Diagnosis not present

## 2020-06-11 DIAGNOSIS — R5383 Other fatigue: Secondary | ICD-10-CM | POA: Insufficient documentation

## 2020-06-11 DIAGNOSIS — R000 Tachycardia, unspecified: Secondary | ICD-10-CM | POA: Diagnosis not present

## 2020-06-11 DIAGNOSIS — J45909 Unspecified asthma, uncomplicated: Secondary | ICD-10-CM | POA: Insufficient documentation

## 2020-06-11 DIAGNOSIS — Z96651 Presence of right artificial knee joint: Secondary | ICD-10-CM | POA: Insufficient documentation

## 2020-06-11 DIAGNOSIS — E1169 Type 2 diabetes mellitus with other specified complication: Secondary | ICD-10-CM | POA: Diagnosis not present

## 2020-06-11 DIAGNOSIS — R531 Weakness: Secondary | ICD-10-CM | POA: Diagnosis not present

## 2020-06-11 DIAGNOSIS — E785 Hyperlipidemia, unspecified: Secondary | ICD-10-CM

## 2020-06-11 DIAGNOSIS — R61 Generalized hyperhidrosis: Secondary | ICD-10-CM | POA: Diagnosis not present

## 2020-06-11 DIAGNOSIS — R0789 Other chest pain: Secondary | ICD-10-CM | POA: Insufficient documentation

## 2020-06-11 DIAGNOSIS — Z79899 Other long term (current) drug therapy: Secondary | ICD-10-CM | POA: Insufficient documentation

## 2020-06-11 DIAGNOSIS — Z20822 Contact with and (suspected) exposure to covid-19: Secondary | ICD-10-CM | POA: Diagnosis not present

## 2020-06-11 DIAGNOSIS — R519 Headache, unspecified: Secondary | ICD-10-CM | POA: Diagnosis not present

## 2020-06-11 DIAGNOSIS — I10 Essential (primary) hypertension: Secondary | ICD-10-CM | POA: Diagnosis not present

## 2020-06-11 DIAGNOSIS — E119 Type 2 diabetes mellitus without complications: Secondary | ICD-10-CM

## 2020-06-11 DIAGNOSIS — Z87891 Personal history of nicotine dependence: Secondary | ICD-10-CM | POA: Diagnosis not present

## 2020-06-11 DIAGNOSIS — R0602 Shortness of breath: Secondary | ICD-10-CM | POA: Insufficient documentation

## 2020-06-11 DIAGNOSIS — R0600 Dyspnea, unspecified: Secondary | ICD-10-CM

## 2020-06-11 DIAGNOSIS — R0609 Other forms of dyspnea: Secondary | ICD-10-CM

## 2020-06-11 DIAGNOSIS — E1165 Type 2 diabetes mellitus with hyperglycemia: Secondary | ICD-10-CM

## 2020-06-11 LAB — HEPATIC FUNCTION PANEL
ALT: 79 U/L — ABNORMAL HIGH (ref 0–44)
AST: 44 U/L — ABNORMAL HIGH (ref 15–41)
Albumin: 4.2 g/dL (ref 3.5–5.0)
Alkaline Phosphatase: 60 U/L (ref 38–126)
Bilirubin, Direct: 0.1 mg/dL (ref 0.0–0.2)
Indirect Bilirubin: 0.5 mg/dL (ref 0.3–0.9)
Total Bilirubin: 0.6 mg/dL (ref 0.3–1.2)
Total Protein: 7.3 g/dL (ref 6.5–8.1)

## 2020-06-11 LAB — CBC WITH DIFFERENTIAL/PLATELET
Abs Immature Granulocytes: 0.03 10*3/uL (ref 0.00–0.07)
Basophils Absolute: 0 10*3/uL (ref 0.0–0.1)
Basophils Relative: 1 %
Eosinophils Absolute: 0.2 10*3/uL (ref 0.0–0.5)
Eosinophils Relative: 3 %
HCT: 41.2 % (ref 39.0–52.0)
Hemoglobin: 14.3 g/dL (ref 13.0–17.0)
Immature Granulocytes: 0 %
Lymphocytes Relative: 37 %
Lymphs Abs: 2.5 10*3/uL (ref 0.7–4.0)
MCH: 29.1 pg (ref 26.0–34.0)
MCHC: 34.7 g/dL (ref 30.0–36.0)
MCV: 83.9 fL (ref 80.0–100.0)
Monocytes Absolute: 0.4 10*3/uL (ref 0.1–1.0)
Monocytes Relative: 6 %
Neutro Abs: 3.7 10*3/uL (ref 1.7–7.7)
Neutrophils Relative %: 53 %
Platelets: 245 10*3/uL (ref 150–400)
RBC: 4.91 MIL/uL (ref 4.22–5.81)
RDW: 13.7 % (ref 11.5–15.5)
WBC: 6.8 10*3/uL (ref 4.0–10.5)
nRBC: 0 % (ref 0.0–0.2)

## 2020-06-11 LAB — CBC
HCT: 45.4 % (ref 39.0–52.0)
Hemoglobin: 15.1 g/dL (ref 13.0–17.0)
MCH: 28.7 pg (ref 26.0–34.0)
MCHC: 33.3 g/dL (ref 30.0–36.0)
MCV: 86.1 fL (ref 80.0–100.0)
Platelets: 247 10*3/uL (ref 150–400)
RBC: 5.27 MIL/uL (ref 4.22–5.81)
RDW: 13.7 % (ref 11.5–15.5)
WBC: 8.1 10*3/uL (ref 4.0–10.5)
nRBC: 0 % (ref 0.0–0.2)

## 2020-06-11 LAB — BASIC METABOLIC PANEL
Anion gap: 13 (ref 5–15)
BUN: 18 mg/dL (ref 6–20)
CO2: 20 mmol/L — ABNORMAL LOW (ref 22–32)
Calcium: 9.9 mg/dL (ref 8.9–10.3)
Chloride: 106 mmol/L (ref 98–111)
Creatinine, Ser: 0.95 mg/dL (ref 0.61–1.24)
GFR, Estimated: >60 mL/min (ref >60)
Glucose, Bld: 261 mg/dL — ABNORMAL HIGH (ref 70–99)
Potassium: 4.1 mmol/L (ref 3.5–5.1)
Sodium: 139 mmol/L (ref 135–145)

## 2020-06-11 LAB — TROPONIN I (HIGH SENSITIVITY)
Troponin I (High Sensitivity): 7 ng/L (ref <18)
Troponin I (High Sensitivity): 9 ng/L (ref <18)

## 2020-06-11 LAB — RESP PANEL BY RT-PCR (FLU A&B, COVID) ARPGX2
Influenza A by PCR: NEGATIVE
Influenza B by PCR: NEGATIVE
SARS Coronavirus 2 by RT PCR: NEGATIVE

## 2020-06-11 LAB — D-DIMER, QUANTITATIVE: D-Dimer, Quant: 0.31 ug/mL-FEU (ref 0.00–0.50)

## 2020-06-11 LAB — CBG MONITORING, ED: Glucose-Capillary: 241 mg/dL — ABNORMAL HIGH (ref 70–99)

## 2020-06-11 LAB — BRAIN NATRIURETIC PEPTIDE: B Natriuretic Peptide: 8.4 pg/mL (ref 0.0–100.0)

## 2020-06-11 MED ORDER — ATORVASTATIN CALCIUM 40 MG PO TABS: 20.0000 mg | ORAL_TABLET | Freq: Every day | ORAL | 0 refills | Status: DC

## 2020-06-11 MED ORDER — METOPROLOL TARTRATE 50 MG PO TABS: 50.0000 mg | ORAL_TABLET | Freq: Two times a day (BID) | ORAL | 1 refills | Status: DC

## 2020-06-11 MED ORDER — METOPROLOL SUCCINATE ER 50 MG PO TB24: 50.0000 mg | ORAL_TABLET | Freq: Every day | ORAL | 1 refills | Status: DC

## 2020-06-11 NOTE — ED Provider Notes (Signed)
Care handoff received from [facility] PA-C at shift change please see previous provider note for full details of visit.  55 year old male with multiple risk factors arrived to the ED with headache shortness of breath and generalized weakness for the past 5 days.  Heart score was 5, consult was placed to cardiology.  Plan of care is to await cardiology recommendations and disposition.  Physical Exam  BP (!) 170/100   Pulse (!) 101   Temp 97.9 F (36.6 C) (Oral)   Resp 20   SpO2 95%   Physical Exam Constitutional:      General: He is not in acute distress.    Appearance: Normal appearance. He is well-developed. He is not ill-appearing or diaphoretic.  HENT:     Head: Normocephalic and atraumatic.  Eyes:     General: Vision grossly intact. Gaze aligned appropriately.     Pupils: Pupils are equal, round, and reactive to light.  Neck:     Trachea: Trachea and phonation normal.  Pulmonary:     Effort: Pulmonary effort is normal. No respiratory distress.  Abdominal:     General: There is no distension.     Palpations: Abdomen is soft.     Tenderness: There is no abdominal tenderness. There is no guarding or rebound.  Musculoskeletal:        General: Normal range of motion.     Cervical back: Normal range of motion.  Skin:    General: Skin is warm and dry.  Neurological:     Mental Status: He is alert.     GCS: GCS eye subscore is 4. GCS verbal subscore is 5. GCS motor subscore is 6.     Comments: Speech is clear and goal oriented, follows commands Major Cranial nerves without deficit, no facial droop Moves extremities without ataxia, coordination intact  Psychiatric:        Behavior: Behavior normal.     ED Course/Procedures   Clinical Course as of 06/11/20 1518  Mon Jun 11, 2020  1126 SARS Coronavirus 2 by RT PCR: NEGATIVE [JS]    Clinical Course User Index [JS] Claude Manges, PA-C    Procedures  MDM  I reviewed and interpreted labs which include: High-sensitivity  within normal limits x2. D-dimer within normal limits. Covid/influenza panel negative. CBG elevated 241. BNP within normal limits. CBC shows no leukocytosis or anemia. LFT showed mild elevation of AST and ALT. BMP shows no emergent electrolyte derangement, AKI or gap.  CXR:  IMPRESSION:  No active disease.   EKG:  Sinus tachycardia Left ventricular hypertrophy with repolarization abnormality ( R in aVL , Romhilt-Estes ) Abnormal ECG Sinus tachycardia, I and aVL changes ST changes present on previous, no STEMI Confirmed by Coralee Pesa [phone]) on 06/11/2020 8:17:50 AM ------------------------ Advised by cardiology, Geoffry Paradise, NP they have cleared patient for discharge.  They recommend patient start metoprolol XL 50 mg daily for blood pressure and heart rate control, they are arranging outpatient coronary CTA and echo. - Patient reassessment, he is resting comfortably in bed no acute distress vital signs are stable he reports he is feeling well and would like to be discharged.  He states understanding of cardiology recommendations and plans to follow-up with him.  Patient was prescribed atorvastatin and metoprolol by cardiology and he plans to go directly to the pharmacy to pick them up after discharge.  At this time there does not appear to be any evidence of an acute emergency medical condition and the patient appears stable  for discharge with appropriate outpatient follow up. Diagnosis was discussed with patient who verbalizes understanding of care plan and is agreeable to discharge. I have discussed return precautions with patient who verbalizes understanding. Patient encouraged to follow-up with their PCP and Cardiology. All questions answered.  Patient's case discussed with Dr. Rush Landmark who agrees with plan to discharge with follow-up.   Note: Portions of this report may have been transcribed using voice recognition software. Every effort was made to ensure accuracy; however,  inadvertent computerized transcription errors may still be present.   Elizabeth Palau 06/11/20 1627    Tilden Fossa, MD 06/12/20 [phone]

## 2020-06-11 NOTE — ED Provider Notes (Signed)
[facility] EMERGENCY DEPARTMENT Provider Note   CSN: [id number] Arrival date & time: 06/11/20  4098     History Chief Complaint  Patient presents with  . Shortness of Breath    Scott Buchanan is a 55 y.o. male.  55 y.o male with a PMH of DM,HTN, Asthma presents to the ED with a chief complaint of shortness of breath, headaches, weakness.  Symptoms began 5 days ago, started with a headache.  He describes this as sure to the top of his head along with the sinus region, he thought he likely had a sinus congestion.  He then developed some overall fatigue, but states that while he is up and about he feels like he is going to "fall out ".  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  He also reports a second episode yesterday while moving around the house, felt like he was more winded while doing his normal ADLs.  He does have a prior history of asthma but reports any increases on his inhaler use.  He also endorses some chest pressure, reports feeling like when he takes a deep breath he feels "like I have fluid in my lungs ".  Also endorsing some left arm pain that described as "like a pulled muscle ".  No other medications have been treated for symptomatic control.  No fevers, changes in vision, no nausea, vomiting. No cardiac history, prior history of blood clots.  No family history of CAD.  No prior cardiac work-up or stress test on file.    The history is provided by the patient.  Shortness of Breath Severity:  Moderate Onset quality:  Sudden Duration:  3 days Progression:  Worsening Chronicity:  New Context: activity   Relieved by:  Rest Worsened by:  Movement Associated symptoms: chest pain and headaches   Associated symptoms: no abdominal pain, no cough, no fever, no sore throat and no vomiting   Risk factors:  no recent alcohol use, no family hx of DVT, no hx of cancer, no hx of PE/DVT, no recent surgery and no tobacco use     HPI: A 55 year old patient with a history of treated diabetes, hypertension, hypercholesterolemia and obesity presents for evaluation of chest pain. Initial onset of pain was more than 6 hours ago. The patient's chest pain is described as heaviness/pressure/tightness and is worse with exertion. The patient reports some diaphoresis. The patient's chest pain is not middle- or left-sided, is not well-localized, is not sharp and does not radiate to the arms/jaw/neck. The patient does not complain of nausea. The patient has no history of stroke, has no history of peripheral artery disease, has not smoked in the past 90 days and has no relevant family history of coronary artery disease (first degree relative at less than age 69).   Past Medical History:  Diagnosis Date  . Arthritis   . Asthma   . Diabetes mellitus without complication (HCC)    type 2  . Hypertension   . Primary localized osteoarthritis of right knee 03/02/2018    Patient Active Problem List   Diagnosis Date Noted  . Acute diarrhea 11/20/2019  . Change in bowel habits 11/20/2019  . Elevated LFTs 11/20/2019  . Fatty liver 11/20/2019  . History of esophagitis 11/20/2019  . Gastritis 03/11/2019  . Screening for viral  disease 03/11/2019  . Musculoskeletal pain 03/11/2019  . Esophagitis 03/11/2019  . LUQ pain 03/11/2019  . Hav (hallux abducto valgus), right 12/21/2018  . Diabetes mellitus type 2 in obese (HCC) 05/14/2018  . Primary localized osteoarthritis of right knee 03/02/2018  . Status post right partial knee replacement 03/02/2018  . Morbid obesity (HCC) 07/27/2017  . Newly diagnosed diabetes (HCC) 05/12/2017  . Chronic frontal sinusitis 01/02/2017  . Essential hypertension 10/21/2016  . Hyperlipidemia LDL goal <100 10/21/2016  . Elevated serum glucose 10/21/2016  . Vitamin D deficiency 10/21/2016  .  Need for Tdap vaccination 10/21/2016    Past Surgical History:  Procedure Laterality Date  . CYSTECTOMY     tailbone  . ETHMOIDECTOMY Bilateral 04/07/2017   Procedure: BILATERAL ETHMOIDECTOMY AND SPHENOIDECTOMY;  Surgeon: Newman Pieseoh, Su, MD;  Location: [facility];  Service: ENT;  Laterality: Bilateral;  . KNEE ARTHROSCOPY W/ MENISCAL REPAIR     right and left knee one year apart  . PARTIAL KNEE ARTHROPLASTY Right 03/02/2018   Procedure: UNICOMPARTMENTAL KNEE;  Surgeon: Teryl LucyLandau, Joshua, MD;  Location: [facility];  Service: Orthopedics;  Laterality: Right;  with block  . SINUS ENDO WITH FUSION Bilateral 04/07/2017   Procedure: BILATERAL FRONTAL RECESS SINUS EXPLORATION WITH SINUS FUSION NAVIGATION;  Surgeon: Newman Pieseoh, Su, MD;  Location: [facility];  Service: ENT;  Laterality: Bilateral;  . SINUS EXPLORATION    . TONSILLECTOMY    . TURBINATE REDUCTION Bilateral 04/07/2017   Procedure: BILATERAL TURBINATE REDUCTION;  Surgeon: Newman Pieseoh, Su, MD;  Location: [facility];  Service: ENT;  Laterality: Bilateral;  . unicompartmental right knee         Family History  Problem Relation Age of Onset  . Hypertension Mother   . Colon cancer Neg Hx   . Colon polyps Neg Hx   . Stomach cancer Neg Hx   . Rectal cancer Neg Hx   . Esophageal cancer Neg Hx   . Inflammatory bowel disease Neg Hx   . Liver disease Neg Hx   . Pancreatic cancer Neg Hx     Social History   Tobacco Use  . Smoking status: Former Smoker    Types: Cigarettes  . Smokeless tobacco: Never Used  . Tobacco comment: quit 10 years ago  Vaping Use  . Vaping Use: Never used  Substance Use Topics  . Alcohol use: No  . Drug use: No    Home Medications Prior to Admission medications   Medication Sig Start Date End Date Taking? Authorizing Provider  albuterol (VENTOLIN HFA) 108 (90 Base) MCG/ACT inhaler Inhale 2 puffs into the lungs every 6 (six) hours as needed for wheezing or shortness of breath.  01/31/19  Yes Quentin AngstJegede, Olugbemiga E, MD  amLODipine (NORVASC) 10 MG tablet Take 1 tablet by mouth once daily Patient taking differently: Take 10 mg by mouth daily. 05/10/20  Yes Massie MaroonHollis, Lachina M, FNP  Ascorbic Acid (VITAMIN C) 1000 MG tablet Take 1,000 mg by mouth daily.   Yes [provider]  aspirin EC 81 MG tablet Take 81 mg by mouth daily.   Yes [provider]  atorvastatin (LIPITOR) 20 MG tablet Take 1 tablet (20 mg total) by mouth daily. 01/02/20  Yes Massie MaroonHollis, Lachina M, FNP  cholecalciferol (VITAMIN D) 1000 units tablet Take 1,000 Units by mouth daily.   Yes [provider]  dicyclomine (BENTYL) 10 MG capsule Take 1 capsule (10 mg total) by mouth 4 (four) times daily -  before meals  and at bedtime. Patient taking differently: Take 10 mg by mouth 3 (three) times daily as needed for spasms. 11/17/19  Yes Mansouraty, Netty Starring., MD  fluticasone [facility]) 50 MCG/ACT nasal spray Place 2 sprays into both nostrils daily.    Yes [provider]  gabapentin (NEURONTIN) 300 MG capsule Take 1 capsule (300 mg total) by mouth at bedtime. 03/23/20  Yes Massie Maroon, FNP  hydrochlorothiazide (HYDRODIURIL) 12.5 MG tablet Take 1 tablet by mouth once daily Patient taking differently: Take 12.5 mg by mouth daily. 04/30/20  Yes Massie Maroon, FNP  levocetirizine (XYZAL) 5 MG tablet TAKE 1 TABLET BY MOUTH ONCE DAILY IN THE EVENING Patient taking differently: Take 5 mg by mouth every evening. 05/29/20  Yes Massie Maroon, FNP  metFORMIN (GLUCOPHAGE) 1000 MG tablet Take 0.5 tablets (500 mg total) by mouth 2 (two) times daily with a meal. 01/31/20  Yes Massie Maroon, FNP  montelukast (SINGULAIR) 10 MG tablet TAKE 1 TABLET BY MOUTH AT BEDTIME Patient taking differently: Take 10 mg by mouth at bedtime. 05/29/20  Yes Massie Maroon, FNP  omeprazole (PRILOSEC) 40 MG capsule Take 1 capsule by mouth once daily Patient taking differently: Take 40 mg by mouth daily. 11/17/19   Yes Mansouraty, Netty Starring., MD  tamsulosin (FLOMAX) 0.4 MG CAPS capsule Take 2 capsules (0.8 mg total) by mouth daily. 07/11/19  Yes Stoioff, Verna Czech, MD  TRULICITY 0.75 MG/0.5ML SOPN INJECT 0.75 MG  SUBCUTANEOUSLY ONCE A WEEK Patient taking differently: Inject 0.75 mg into the skin once a week. Thursday 04/30/20  Yes Massie Maroon, FNP    Allergies    Patient has no known allergies.  Review of Systems   Review of Systems  Constitutional: Negative for fever.  HENT: Negative for sore throat.   Respiratory: Positive for shortness of breath. Negative for cough.   Cardiovascular: Positive for chest pain.  Gastrointestinal: Positive for diarrhea (chronic for patient after greasy foods). Negative for abdominal pain, nausea and vomiting.  Genitourinary: Negative for flank pain.  Musculoskeletal: Negative for back pain.  Skin: Negative for pallor and wound.  Neurological: Positive for headaches.  All other systems reviewed and are negative.   Physical Exam Updated Vital Signs BP (!) 170/100   Pulse (!) 101   Temp 97.9 F (36.6 C) (Oral)   Resp 20   SpO2 95%   Physical Exam Vitals and nursing note reviewed.  Constitutional:      Appearance: He is well-developed.  HENT:     Head: Normocephalic and atraumatic.  Eyes:     Pupils: Pupils are equal, round, and reactive to light.  Cardiovascular:     Rate and Rhythm: Tachycardia present.     Heart sounds: No murmur heard.   Pulmonary:     Effort: Pulmonary effort is normal.     Breath sounds: Decreased breath sounds present. No wheezing or rales.  Chest:     Chest wall: No mass.  Abdominal:     Palpations: Abdomen is soft.     Tenderness: There is no guarding or rebound.  Musculoskeletal:     Right lower leg: No tenderness. No edema.     Left lower leg: No tenderness. No edema.  Skin:    General: Skin is warm and dry.  Neurological:     Mental Status: He is alert and oriented to person, place, and time.     ED  Results / Procedures / Treatments   Labs (all labs ordered are  listed, but only abnormal results are displayed) Labs Reviewed  BASIC METABOLIC PANEL - Abnormal; Notable for the following components:      Result Value   CO2 20 (*)    Glucose, Bld 261 (*)    All other components within normal limits  HEPATIC FUNCTION PANEL - Abnormal; Notable for the following components:   AST 44 (*)    ALT 79 (*)    All other components within normal limits  CBG MONITORING, ED - Abnormal; Notable for the following components:   Glucose-Capillary 241 (*)    All other components within normal limits  RESP PANEL BY RT-PCR (FLU A&B, COVID) ARPGX2  CBC  CBC WITH DIFFERENTIAL/PLATELET  BRAIN NATRIURETIC PEPTIDE  D-DIMER, QUANTITATIVE (NOT AT [facility])  TROPONIN I (HIGH SENSITIVITY)  TROPONIN I (HIGH SENSITIVITY)    EKG EKG Interpretation  Date/Time:  Monday June 11 2020 08:13:25 EST Ventricular Rate:  117 PR Interval:  166 QRS Duration: 92 QT Interval:  332 QTC Calculation: 463 R Axis:   -11 Text Interpretation: Sinus tachycardia Left ventricular hypertrophy with repolarization abnormality ( R in aVL , Romhilt-Estes ) Abnormal ECG Sinus tachycardia, I and aVL changes ST changes present on previous, no STEMI Confirmed by Coralee Pesa [phone]) on 06/11/2020 8:17:50 AM   Radiology DG Chest Portable 1 View  Result Date: 06/11/2020 CLINICAL DATA:  Shortness of breath. EXAM: PORTABLE CHEST 1 VIEW COMPARISON:  December 03, 2017. FINDINGS: The heart size and mediastinal contours are within normal limits. Both lungs are clear. No pneumothorax or pleural effusion is noted. The visualized skeletal structures are unremarkable. IMPRESSION: No active disease. Electronically Signed   By: Lupita Raider M.D.   On: 06/11/2020 09:06    Procedures Procedures   Medications Ordered in ED Medications - No data to display  ED Course  I have reviewed the triage vital signs and the nursing notes.  Pertinent labs  & imaging results that were available during my care of the patient were reviewed by me and considered in my medical decision making (see chart for details).  Clinical Course as of 06/11/20 1508  Mon Jun 11, 2020  1126 SARS Coronavirus 2 by RT PCR: NEGATIVE [JS]    Clinical Course User Index [JS] Claude Manges, PA-C   MDM Rules/Calculators/A&P   Patient is a 55 year old male with a previous history of diabetes, asthma presenting to the ED with headache, shortness of breath, overall weakness.  Does have been ongoing for the past 5 days, exacerbated with movement along with performing ADLs while at home.  This is improved by rest.Prior completion of COVID-19 immunizations.    During evaluation vital signs are remarkable for tachycardia, oxygen saturation above 95% while in the room.  Blood pressure is elevated with a systolic in the 195, diastolic in the upper 100s.  He is afebrile on arrival. No infectious signs such as cough, fever, production and sputum.  No prior history of CHF, no bilateral pitting edema or calf tenderness.  Abdomen is soft, nontender to palpation.  Is are decreased to auscultation, however no wheezing on my exam.  Chest x-ray reviewed by me without any fluid, consolidation or pneumothorax.  BMP without any electrolyte abnormality, creatinine level is within normal limits.  CBC without any leukocytosis, his hemoglobin is stable.  First troponin is flat, will obtain delta to further evaluate.  EKG is changed from previous however without any changes consistent with infarct at this time.  BNP is normal, lower suspicion for CHF at  this time with a normal chest x-ray without any signs of cardiomegaly.  She does have prior COVID-19 immunizations, the month of July, we discussed screening with 2-hour Covid test on today's visit.  Shared decision making conversation, further in evaluating for shortness of breath via CT angio, however patient has opted for D-dimer level at this time.Marland Kitchen   He will also be ambulated with a pulse ox.  Vitals remarkable for hypertension throughout his stay, prior review with recent visits in which patient was hypertensive at baseline.  He is currently on Norvasc and HCTZ, and reports compliance with this medication.  I feel that patient needs further referral for the hypertensive clinic at this time.  COVID-19 test is negative.  D-dimer is also negative at this time.  Gust case with my attending Dr. Rush Landmark, we feel that patient's risk factors along with atypical pain need further evaluation.   11:35 AM call placed to cardiology for further recommendations.  Heart score currently 5.  Patient was ambulated by nurse tech while in his room, he reports while sitting down after exerting himself, he began to feel that left chest pressure again, feels like there is something off to his chest wall area.  Continue to await cardiology recommendations at this time.  3:08 PM patient care signed out to incoming team pending cardiology recommendations.    Portions of this note were generated with Scientist, clinical (histocompatibility and immunogenetics). Dictation errors may occur despite best attempts at proofreading.  Final Clinical Impression(s) / ED Diagnoses Final diagnoses:  Atypical chest pain  Shortness of breath    Rx / DC Orders ED Discharge Orders    None       Claude Manges, PA-C 06/11/20 1508    Tegeler, Canary Brim, MD 06/11/20 [phone]

## 2020-06-11 NOTE — ED Notes (Signed)
Cardiology at bedside at this time

## 2020-06-11 NOTE — ED Notes (Signed)
Had pt ambulate in his room for a little while..he began walking at a face pace raising his pulse to 120, advised the pt to slow down and take deep breaths, the lowest his pulse got at this point was 114 and O2 stayed between 98-99 while ambulating.

## 2020-06-11 NOTE — ED Triage Notes (Signed)
Pt arrives to ED with chief complaint of shortness of breath , lightheaded and headache that began two days ago. Pt is clammy in triage and very hypertensive.

## 2020-06-11 NOTE — Consult Note (Signed)
Cardiology Consultation:   Patient ID: Scott Buchanan MRN: [id number]; DOB: 13-Jun-1965  Admit date: 06/11/2020 Date of Consult: 06/11/2020  PCP:  Massie MaroonHollis, Lachina M, FNP   [facility]  Cardiologist:  Peter SwazilandJordan, MD  Advanced Practice Provider:  No care team member to display Electrophysiologist:  None   Patient Profile:   Scott Buchanan is a 55 y.o. male with a hx of HTN, DM, asthma, arthritis who is being seen today for the evaluation of shortness of breath/chest pain at the request of Dr. Rush Landmarkegeler.  History of Present Illness:   Scott Buchanan is a 55 yo male with PMH noted above.  He is followed by primary care provider for his chronic illnesses.  He is a non-smoker, and denies any significant past medical history of coronary artery disease.  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  He has never had any shortness of breath or anginal symptoms prior to episode on Saturday.  States he was working to set out some tables whenever he experienced significant shortness of breath pain underneath his left arm and chest burning.  He rested and symptoms improved.  Did have a recurrence of shortness of breath on Sunday with minimal activity.  Again rested with improvement in symptoms.  States he has just felt significantly fatigued for the past 2 days and did not feel well enough to go to work today.  Presented to the ER for further management.  In the ED his labs showed stable electrolytes, high-sensitivity troponin negative x2, D-dimer 0.31, 0.8, hemoglobin 14.3.  EKG showed sinus tachycardia, 117 bpm, LVH, nonspecific changes.  Chest x-ray negative.  No further chest pain since being in the ER.  He did get up to ambulate and became tachycardic with heart rates in the 110s, but maintained stable O2 sats.  He is noted to be significantly hypertensive with systolic blood pressures in the 150-170 range.  Cardiology is asked to evaluate in the ED.   Past  Medical History:  Diagnosis Date  . Arthritis   . Asthma   . Diabetes mellitus without complication (HCC)    type 2  . Hypertension   . Primary localized osteoarthritis of right knee 03/02/2018    Past Surgical History:  Procedure Laterality Date  . CYSTECTOMY     tailbone  . ETHMOIDECTOMY Bilateral 04/07/2017   Procedure: BILATERAL ETHMOIDECTOMY AND SPHENOIDECTOMY;  Surgeon: Newman Pieseoh, Su, MD;  Location: [facility];  Service: ENT;  Laterality: Bilateral;  . KNEE ARTHROSCOPY W/ MENISCAL REPAIR     right and left knee one year apart  . PARTIAL KNEE ARTHROPLASTY Right 03/02/2018   Procedure: UNICOMPARTMENTAL KNEE;  Surgeon: Teryl LucyLandau, Joshua, MD;  Location: [facility];  Service: Orthopedics;  Laterality: Right;  with block  . SINUS ENDO WITH FUSION Bilateral 04/07/2017   Procedure: BILATERAL FRONTAL RECESS SINUS EXPLORATION WITH SINUS FUSION NAVIGATION;  Surgeon: Newman Pieseoh, Su, MD;  Location: [facility];  Service: ENT;  Laterality: Bilateral;  . SINUS EXPLORATION    . TONSILLECTOMY    . TURBINATE REDUCTION Bilateral 04/07/2017   Procedure: BILATERAL TURBINATE REDUCTION;  Surgeon: Newman Pieseoh, Su, MD;  Location: [facility];  Service: ENT;  Laterality: Bilateral;  . unicompartmental right knee       Home Medications:  Prior to Admission medications   Medication Sig Start Date End Date Taking? Authorizing Provider  albuterol (VENTOLIN HFA) 108 (90 Base) MCG/ACT inhaler Inhale 2 puffs into the lungs  every 6 (six) hours as needed for wheezing or shortness of breath. 01/31/19  Yes Quentin Angst, MD  amLODipine (NORVASC) 10 MG tablet Take 1 tablet by mouth once daily Patient taking differently: Take 10 mg by mouth daily. 05/10/20  Yes Massie Maroon, FNP  Ascorbic Acid (VITAMIN C) 1000 MG tablet Take 1,000 mg by mouth daily.   Yes [provider]  aspirin EC 81 MG tablet Take 81 mg by mouth daily.   Yes [provider]  atorvastatin  (LIPITOR) 20 MG tablet Take 1 tablet (20 mg total) by mouth daily. 01/02/20  Yes Massie Maroon, FNP  cholecalciferol (VITAMIN D) 1000 units tablet Take 1,000 Units by mouth daily.   Yes [provider]  dicyclomine (BENTYL) 10 MG capsule Take 1 capsule (10 mg total) by mouth 4 (four) times daily -  before meals and at bedtime. Patient taking differently: Take 10 mg by mouth 3 (three) times daily as needed for spasms. 11/17/19  Yes Mansouraty, Netty Starring., MD  fluticasone [facility]) 50 MCG/ACT nasal spray Place 2 sprays into both nostrils daily.    Yes [provider]  gabapentin (NEURONTIN) 300 MG capsule Take 1 capsule (300 mg total) by mouth at bedtime. 03/23/20  Yes Massie Maroon, FNP  hydrochlorothiazide (HYDRODIURIL) 12.5 MG tablet Take 1 tablet by mouth once daily Patient taking differently: Take 12.5 mg by mouth daily. 04/30/20  Yes Massie Maroon, FNP  levocetirizine (XYZAL) 5 MG tablet TAKE 1 TABLET BY MOUTH ONCE DAILY IN THE EVENING Patient taking differently: Take 5 mg by mouth every evening. 05/29/20  Yes Massie Maroon, FNP  metFORMIN (GLUCOPHAGE) 1000 MG tablet Take 0.5 tablets (500 mg total) by mouth 2 (two) times daily with a meal. 01/31/20  Yes Massie Maroon, FNP  montelukast (SINGULAIR) 10 MG tablet TAKE 1 TABLET BY MOUTH AT BEDTIME Patient taking differently: Take 10 mg by mouth at bedtime. 05/29/20  Yes Massie Maroon, FNP  omeprazole (PRILOSEC) 40 MG capsule Take 1 capsule by mouth once daily Patient taking differently: Take 40 mg by mouth daily. 11/17/19  Yes Mansouraty, Netty Starring., MD  tamsulosin (FLOMAX) 0.4 MG CAPS capsule Take 2 capsules (0.8 mg total) by mouth daily. 07/11/19  Yes Stoioff, Verna Czech, MD  TRULICITY 0.75 MG/0.5ML SOPN INJECT 0.75 MG  SUBCUTANEOUSLY ONCE A WEEK Patient taking differently: Inject 0.75 mg into the skin once a week. Thursday 04/30/20  Yes Massie Maroon, FNP    Inpatient Medications: Scheduled Meds:  Continuous  Infusions: . sodium chloride     PRN Meds:   Allergies:   No Known Allergies  Social History:   Social History   Socioeconomic History  . Marital status: Single    Spouse name: Not on file  . Number of children: 1  . Years of education: Not on file  . Highest education level: Not on file  Occupational History  . Occupation: maintance   Tobacco Use  . Smoking status: Former Smoker    Types: Cigarettes  . Smokeless tobacco: Never Used  . Tobacco comment: quit 10 years ago  Vaping Use  . Vaping Use: Never used  Substance and Sexual Activity  . Alcohol use: No  . Drug use: No  . Sexual activity: Yes  Other Topics Concern  . Not on file  Social History Narrative  . Not on file   Social Determinants of Health   Financial Resource Strain: Not on file  Food Insecurity: Not  on file  Transportation Needs: Not on file  Physical Activity: Not on file  Stress: Not on file  Social Connections: Not on file  Intimate Partner Violence: Not on file    Family History:    Family History  Problem Relation Age of Onset  . Hypertension Mother   . Colon cancer Neg Hx   . Colon polyps Neg Hx   . Stomach cancer Neg Hx   . Rectal cancer Neg Hx   . Esophageal cancer Neg Hx   . Inflammatory bowel disease Neg Hx   . Liver disease Neg Hx   . Pancreatic cancer Neg Hx      ROS:  Please see the history of present illness.   All other ROS reviewed and negative.     Physical Exam/Data:   Vitals:   06/11/20 1315 06/11/20 1330 06/11/20 1345 06/11/20 1400  BP: (!) 167/110 (!) 152/138 (!) 172/94 (!) 170/100  Pulse: 94 98 97 (!) 101  Resp: 17 11 16 20   Temp:      TempSrc:      SpO2: 94% 91% 94% 95%   No intake or output data in the 24 hours ending 06/11/20 1406 Last 3 Weights 03/13/2020 12/13/2019 11/17/2019  Weight (lbs) 334 lb 3.2 oz 329 lb 323 lb 4 oz  Weight (kg) 151.592 kg 149.233 kg 146.625 kg     There is no height or weight on file to calculate BMI.  General:  Well  nourished, well developed, obese male in no acute distress HEENT: normal Lymph: no adenopathy Neck: no JVD Endocrine:  No thryomegaly Vascular: No carotid bruits Cardiac:  normal S1, S2; RRR; no murmur  Lungs:  clear to auscultation bilaterally, no wheezing, rhonchi or rales  Abd: soft, nontender, no hepatomegaly  Ext: no edema Musculoskeletal:  No deformities, BUE and BLE strength normal and equal Skin: warm and dry  Neuro:  CNs 2-12 intact, no focal abnormalities noted Psych:  Normal affect   EKG:  The EKG was personally reviewed and demonstrates: Sinus tachycardia, 117 bpm, LVH, nonspecific changes   Relevant CV Studies:  N/A  Laboratory Data:  High Sensitivity Troponin:   Recent Labs  Lab 06/11/20 0818 06/11/20 1015  TROPONINIHS 7 9     Chemistry Recent Labs  Lab 06/11/20 0818  NA 139  K 4.1  CL 106  CO2 20*  GLUCOSE 261*  BUN 18  CREATININE 0.95  CALCIUM 9.9  GFRNONAA >60  ANIONGAP 13    Recent Labs  Lab 06/11/20 0818  PROT 7.3  ALBUMIN 4.2  AST 44*  ALT 79*  ALKPHOS 60  BILITOT 0.6   Hematology Recent Labs  Lab 06/11/20 0818  WBC 6.8  8.1  RBC 4.91  5.27  HGB 14.3  15.1  HCT 41.2  45.4  MCV 83.9  86.1  MCH 29.1  28.7  MCHC 34.7  33.3  RDW 13.7  13.7  PLT 245  247   BNP Recent Labs  Lab 06/11/20 0818  BNP 8.4    DDimer  Recent Labs  Lab 06/11/20 1041  DDIMER 0.31     Radiology/Studies:  DG Chest Portable 1 View  Result Date: 06/11/2020 CLINICAL DATA:  Shortness of breath. EXAM: PORTABLE CHEST 1 VIEW COMPARISON:  December 03, 2017. FINDINGS: The heart size and mediastinal contours are within normal limits. Both lungs are clear. No pneumothorax or pleural effusion is noted. The visualized skeletal structures are unremarkable. IMPRESSION: No active disease. Electronically Signed   By: December 05, 2017  Christen Butter M.D.   On: 06/11/2020 09:06     Assessment and Plan:   Scott Buchanan is a 55 y.o. male with a hx of HTN, DM, asthma,  arthritis who is being seen today for the evaluation of shortness of breath/chest pain at the request of Dr. Rush Landmark.  1.  Dyspnea/chest burning: This is noted to be exertional on several episodes starting on Saturday.  Thankfully high-sensitivity troponins have been negative.  EKG shows sinus tachycardia with no acute ST/T wave abnormalities though LVH is noted.  Does have risk factors with hypertension, hyperlipidemia and diabetes.  Ideally would like to send for coronary CTA but patient is tachycardic at baseline.  We will need to start on beta-blocker therapy. -- will start on metoprolol 50mg  BID -- schedule for outpatient follow up with plans for CCTA pending HR has improved -- outpatient echo  2.  Hypertension: He has been on amlodipine 10 mg daily prior to admission.  Suspect blood pressures have not been well controlled though he does not check them as an outpatient.  As above start on beta-blocker therapy  3.  Hyperlipidemia: Last lipid check 3 months ago noted LDL 117 --Currently on atorvastatin 20 mg daily, would increase to 40mg  daily  4.  Diabetes: Hemoglobin A1c 6.2 --Managed with Metformin and Trulicity at home   Risk Assessment/Risk Scores:   HEAR Score (for undifferentiated chest pain):  HEAR Score: 5{   For questions or updates, please contact [facility] Please consult [email or web address] for contact info under    Signed, , NP  06/11/2020 2:06 PM

## 2020-06-11 NOTE — Discharge Instructions (Addendum)
At this time there does not appear to be the presence of an emergent medical condition, however there is always the potential for conditions to change. Please read and follow the below instructions.  Please return to the Emergency Department immediately for any new or worsening symptoms. Please be sure to follow up with your Primary Care Provider within one week regarding your visit today; please call their office to schedule an appointment even if you are feeling better for a follow-up visit. He has been prescribed 2 new medications by the cardiologist today including metoprolol and atorvastatin.  Please take these as prescribed by the cardiologist.  Please call their office tomorrow morning to confirm your appointment which has been scheduled on March 2 at 2:15 PM.  Go to the nearest Emergency Department immediately if: You have fever or chills Your chest pain is worse. You have a cough that gets worse, or you cough up blood. You have very bad (severe) pain in your belly (abdomen). You pass out (faint). You have either of these for no clear reason: Sudden chest discomfort. Sudden discomfort in your arms, back, neck, or jaw. You have shortness of breath at any time. You suddenly start to sweat, or your skin gets clammy. You feel sick to your stomach (nauseous). You throw up (vomit). You suddenly feel lightheaded or dizzy. You feel very weak or tired. Your heart starts to beat fast, or it feels like it is skipping beats. You have any new/concerning or worsening of symptoms.   Please read the additional information packets attached to your discharge summary.  Do not take your medicine if  develop an itchy rash, swelling in your mouth or lips, or difficulty breathing; call 911 and seek immediate emergency medical attention if this occurs.  You may review your lab tests and imaging results in their entirety on your MyChart account.  Please discuss all results of fully with your primary care  provider and other specialist at your follow-up visit.  Note: Portions of this text may have been transcribed using voice recognition software. Every effort was made to ensure accuracy; however, inadvertent computerized transcription errors may still be present.

## 2020-06-12 ENCOUNTER — Ambulatory Visit: Payer: PRIVATE HEALTH INSURANCE | Admitting: Family Medicine

## 2020-06-20 ENCOUNTER — Encounter: Payer: Self-pay | Admitting: Cardiology

## 2020-06-20 ENCOUNTER — Other Ambulatory Visit: Payer: Self-pay

## 2020-06-20 ENCOUNTER — Ambulatory Visit: Payer: PRIVATE HEALTH INSURANCE | Admitting: Cardiology

## 2020-06-20 VITALS — BP 170/101 | HR 85 | Ht 76.0 in | Wt 305.0 lb

## 2020-06-20 DIAGNOSIS — E785 Hyperlipidemia, unspecified: Secondary | ICD-10-CM | POA: Diagnosis not present

## 2020-06-20 DIAGNOSIS — E1169 Type 2 diabetes mellitus with other specified complication: Secondary | ICD-10-CM | POA: Diagnosis not present

## 2020-06-20 DIAGNOSIS — I10 Essential (primary) hypertension: Secondary | ICD-10-CM | POA: Diagnosis not present

## 2020-06-20 DIAGNOSIS — R0789 Other chest pain: Secondary | ICD-10-CM

## 2020-06-20 DIAGNOSIS — E669 Obesity, unspecified: Secondary | ICD-10-CM

## 2020-06-20 MED ORDER — VALSARTAN 40 MG PO TABS: 40.0000 mg | ORAL_TABLET | Freq: Every day | ORAL | 5 refills | Status: DC

## 2020-06-20 NOTE — Assessment & Plan Note (Signed)
BMI 37- no clear sleep apnea symptoms

## 2020-06-20 NOTE — Assessment & Plan Note (Signed)
NIDDM-followed by PCP 

## 2020-06-20 NOTE — Assessment & Plan Note (Signed)
Lipitor 40 mg added

## 2020-06-20 NOTE — Assessment & Plan Note (Signed)
He needs a coronary CTA once his B/P is under better control

## 2020-06-20 NOTE — Progress Notes (Signed)
Cardiology Office Note:    Date:  06/20/2020   ID:  Scott Buchanan, DOB 08-20-1965, MRN [id number]  PCP:  Massie Maroon, FNP  Cardiologist:  Peter Swaziland, MD  Electrophysiologist:  None   Referring MD: Massie Maroon, FNP   No chief complaint on file. post hospital follow up  History of Present Illness:    Scott Buchanan is a 55 y.o. male with a hx of hypertension, non-insulin-dependent diabetes, obesity, and BPH.  He has had no prior cardiac work-up.  He presented to emergency room 06/11/2020 with complaints of fatigue.  In the emergency room because of his history of diabetes and uncontrolled hypertension cardiology was consulted.  The patient does not really give a history of anginal chest pain.  He does have multiple risk factors for CAD.  His medications were adjusted, he was put on Toprol 50 mg a day although for some reason his prescription says a half a tablet a day.  He was also placed on Lipitor 40 mg a day.  He is in the office today for follow-up.  His blood pressure is elevated, initially was 170/100 repeat by me with a large cuff 148/88.  His heart rate is 86.  He denies any chest pain currently.  Past Medical History:  Diagnosis Date  . Arthritis   . Asthma   . Diabetes mellitus without complication (HCC)    type 2  . Hypertension   . Primary localized osteoarthritis of right knee 03/02/2018    Past Surgical History:  Procedure Laterality Date  . CYSTECTOMY     tailbone  . ETHMOIDECTOMY Bilateral 04/07/2017   Procedure: BILATERAL ETHMOIDECTOMY AND SPHENOIDECTOMY;  Surgeon: Newman Pies, MD;  Location: [facility];  Service: ENT;  Laterality: Bilateral;  . KNEE ARTHROSCOPY W/ MENISCAL REPAIR     right and left knee one year apart  . PARTIAL KNEE ARTHROPLASTY Right 03/02/2018   Procedure: UNICOMPARTMENTAL KNEE;  Surgeon: Teryl Lucy, MD;  Location: [facility];  Service: Orthopedics;  Laterality: Right;  with block  . SINUS ENDO WITH FUSION Bilateral  04/07/2017   Procedure: BILATERAL FRONTAL RECESS SINUS EXPLORATION WITH SINUS FUSION NAVIGATION;  Surgeon: Newman Pies, MD;  Location: [facility];  Service: ENT;  Laterality: Bilateral;  . SINUS EXPLORATION    . TONSILLECTOMY    . TURBINATE REDUCTION Bilateral 04/07/2017   Procedure: BILATERAL TURBINATE REDUCTION;  Surgeon: Newman Pies, MD;  Location: [facility];  Service: ENT;  Laterality: Bilateral;  . unicompartmental right knee      Current Medications: Current Meds  Medication Sig  . albuterol (VENTOLIN HFA) 108 (90 Base) MCG/ACT inhaler Inhale 2 puffs into the lungs every 6 (six) hours as needed for wheezing or shortness of breath.  Marland Kitchen amLODipine (NORVASC) 10 MG tablet Take 1 tablet by mouth once daily (Patient taking differently: Take 10 mg by mouth daily.)  . Ascorbic Acid (VITAMIN C) 1000 MG tablet Take 1,000 mg by mouth daily.  Marland Kitchen aspirin EC 81 MG tablet Take 81 mg by mouth daily.  Marland Kitchen atorvastatin (LIPITOR) 40 MG tablet Take 0.5 tablets (20 mg total) by mouth daily.  . cholecalciferol (VITAMIN D) 1000 units tablet Take 1,000 Units by mouth daily.  Marland Kitchen dicyclomine (BENTYL) 10 MG capsule Take 1 capsule (10 mg total) by mouth 4 (four) times daily -  before meals and at bedtime. (Patient taking differently: Take 10 mg by mouth 3 (three) times daily as needed for spasms.)  .  fluticasone (FLONASE) 50 MCG/ACT nasal spray Place 2 sprays into both nostrils daily.   Marland Kitchen gabapentin (NEURONTIN) 300 MG capsule Take 1 capsule (300 mg total) by mouth at bedtime.  . hydrochlorothiazide (HYDRODIURIL) 12.5 MG tablet Take 1 tablet by mouth once daily (Patient taking differently: Take 12.5 mg by mouth daily.)  . levocetirizine (XYZAL) 5 MG tablet TAKE 1 TABLET BY MOUTH ONCE DAILY IN THE EVENING (Patient taking differently: Take 5 mg by mouth every evening.)  . metFORMIN (GLUCOPHAGE) 1000 MG tablet Take 0.5 tablets (500 mg total) by mouth 2 (two) times daily with a meal.  . metoprolol  succinate (TOPROL XL) 50 MG 24 hr tablet Take 1 tablet (50 mg total) by mouth daily. Take with or immediately following a meal.  . montelukast (SINGULAIR) 10 MG tablet TAKE 1 TABLET BY MOUTH AT BEDTIME (Patient taking differently: Take 10 mg by mouth at bedtime.)  . omeprazole (PRILOSEC) 40 MG capsule Take 1 capsule by mouth once daily (Patient taking differently: Take 40 mg by mouth daily.)  . tamsulosin (FLOMAX) 0.4 MG CAPS capsule Take 2 capsules (0.8 mg total) by mouth daily.  . TRULICITY 0.75 MG/0.5ML SOPN INJECT 0.75 MG  SUBCUTANEOUSLY ONCE A WEEK (Patient taking differently: Inject 0.75 mg into the skin once a week. Thursday)  . valsartan (DIOVAN) 40 MG tablet Take 1 tablet (40 mg total) by mouth daily.     Allergies:   Patient has no known allergies.   Social History   Socioeconomic History  . Marital status: Single    Spouse name: Not on file  . Number of children: 1  . Years of education: Not on file  . Highest education level: Not on file  Occupational History  . Occupation: maintance   Tobacco Use  . Smoking status: Former Smoker    Types: Cigarettes  . Smokeless tobacco: Never Used  . Tobacco comment: quit 10 years ago  Vaping Use  . Vaping Use: Never used  Substance and Sexual Activity  . Alcohol use: No  . Drug use: No  . Sexual activity: Yes  Other Topics Concern  . Not on file  Social History Narrative  . Not on file   Social Determinants of Health   Financial Resource Strain: Not on file  Food Insecurity: Not on file  Transportation Needs: Not on file  Physical Activity: Not on file  Stress: Not on file  Social Connections: Not on file     Family History: The patient's family history includes Hypertension in his mother. There is no history of Colon cancer, Colon polyps, Stomach cancer, Rectal cancer, Esophageal cancer, Inflammatory bowel disease, Liver disease, or Pancreatic cancer.  ROS:   Please see the history of present illness.    Denies  daytime fatigue  All other systems reviewed and are negative.  EKGs/Labs/Other Studies Reviewed:    The following studies were reviewed today:   EKG:  EKG is not ordered today.  The ekg ordered 06/12/2020 demonstrates NSR, ST  Recent Labs: 03/13/2020: TSH 1.270 06/11/2020: ALT 79; B Natriuretic Peptide 8.4; BUN 18; Creatinine, Ser 0.95; Hemoglobin 15.1; Hemoglobin 14.3; Platelets 247; Platelets 245; Potassium 4.1; Sodium 139  Recent Lipid Panel    Component Value Date/Time   CHOL 177 03/13/2020 1044   TRIG 130 03/13/2020 1044   HDL 37 (L) 03/13/2020 1044   CHOLHDL 4.8 03/13/2020 1044   LDLCALC 117 (H) 03/13/2020 1044    Physical Exam:    VS:  BP (!) 170/101  Pulse 85   Ht 6\' 4"  (1.93 m)   Wt (!) 305 lb (138.3 kg)   SpO2 96%   BMI 37.13 kg/m     Wt Readings from Last 3 Encounters:  06/20/20 (!) 305 lb (138.3 kg)  03/13/20 (!) 334 lb 3.2 oz (151.6 kg)  12/13/19 (!) 329 lb (149.2 kg)     GEN: Obese male, well developed in no acute distress HEENT: Normal NECK: No JVD; No carotid bruits CARDIAC: RRR, no murmurs, rubs, gallops RESPIRATORY:  Clear to auscultation without rales, wheezing or rhonchi  ABDOMEN: Soft, non-tender, non-distended MUSCULOSKELETAL:  No edema; No deformity  SKIN: Warm and dry NEUROLOGIC:  Alert and oriented x 3 PSYCHIATRIC:  Normal affect   ASSESSMENT:    Essential hypertension Poor control  Diabetes mellitus type 2 in obese (HCC) NIDDM followed by PCP  Hyperlipidemia LDL goal <100 Lipitor 40 mg added  Morbid obesity (HCC) BMI 37- no clear sleep apnea symptoms  Atypical chest pain He needs a coronary CTA once his B/P is under better control  PLAN:    Add Valsartan 40 mg , increase Toprol to 50 mg. F/U BMP when he comes in for his echo 3/15.  OV 1-2 weeks after his echo to f/u B/P- order coronary CT then if appropriate.    Medication Adjustments/Labs and Tests Ordered: Current medicines are reviewed at length with the patient  today.  Concerns regarding medicines are outlined above.  Orders Placed This Encounter  Procedures  . Basic metabolic panel   Meds ordered this encounter  Medications  . valsartan (DIOVAN) 40 MG tablet    Sig: Take 1 tablet (40 mg total) by mouth daily.    Dispense:  30 tablet    Refill:  5    There are no Patient Instructions on file for this visit.   Signed, 4/15, PA-C  06/20/2020 2:42 PM    [facility]

## 2020-06-20 NOTE — Patient Instructions (Signed)
Medication Instructions:  Start Valsartan 40 mg ( 1 Tablet Daily) *If you need a refill on your cardiac medications before your next appointment, please call your pharmacy*   Lab Work: BMP ( To Be Done Day of EchoCardiogram July 03, 2020) If you have labs (blood work) drawn today and your tests are completely normal, you will receive your results only by: Marland Kitchen MyChart Message (if you have MyChart) OR . A paper copy in the mail If you have any lab test that is abnormal or we need to change your treatment, we will call you to review the results.   Testing/Procedures: No Testing    Follow-Up: At [facility], you and your health needs are our priority.  As part of our continuing mission to provide you with exceptional heart care, we have created designated Provider Care Teams.  These Care Teams include your primary Cardiologist (physician) and Advanced Practice Providers (APPs -  Physician Assistants and Nurse Practitioners) who all work together to provide you with the care you need, when you need it.   Your next appointment:   2 weeks  The format for your next appointment:   In Person  Provider:   You may see Peter Swaziland, MD or one of the following Advanced Practice Providers on your designated Care Team:    Azalee Course, PA-C  Micah Flesher, PA-C or   Judy Pimple, [place]

## 2020-06-20 NOTE — Assessment & Plan Note (Signed)
Poor control

## 2020-06-26 ENCOUNTER — Encounter: Payer: Self-pay | Admitting: Family Medicine

## 2020-06-26 ENCOUNTER — Other Ambulatory Visit: Payer: Self-pay

## 2020-06-26 ENCOUNTER — Ambulatory Visit: Payer: PRIVATE HEALTH INSURANCE | Admitting: Family Medicine

## 2020-06-26 VITALS — BP 158/85 | HR 88 | Temp 97.7°F | Ht 76.0 in | Wt 344.0 lb

## 2020-06-26 DIAGNOSIS — E1169 Type 2 diabetes mellitus with other specified complication: Secondary | ICD-10-CM | POA: Diagnosis not present

## 2020-06-26 DIAGNOSIS — E669 Obesity, unspecified: Secondary | ICD-10-CM

## 2020-06-26 DIAGNOSIS — R062 Wheezing: Secondary | ICD-10-CM | POA: Diagnosis not present

## 2020-06-26 DIAGNOSIS — I10 Essential (primary) hypertension: Secondary | ICD-10-CM

## 2020-06-26 DIAGNOSIS — Z09 Encounter for follow-up examination after completed treatment for conditions other than malignant neoplasm: Secondary | ICD-10-CM

## 2020-06-26 DIAGNOSIS — E785 Hyperlipidemia, unspecified: Secondary | ICD-10-CM

## 2020-06-26 LAB — POCT GLYCOSYLATED HEMOGLOBIN (HGB A1C)
HbA1c POC (<> result, manual entry): 8.1 % (ref 4.0–5.6)
HbA1c, POC (controlled diabetic range): 8.1 % — AB (ref 0.0–7.0)
HbA1c, POC (prediabetic range): 8.1 % — AB (ref 5.7–6.4)
Hemoglobin A1C: 8.1 % — AB (ref 4.0–5.6)

## 2020-06-26 MED ORDER — ALBUTEROL SULFATE HFA 108 (90 BASE) MCG/ACT IN AERS: 2.0000 | INHALATION_SPRAY | RESPIRATORY_TRACT | 3 refills | Status: AC | PRN

## 2020-06-26 NOTE — Progress Notes (Signed)
[facility]   Established Patient Office Visit  Subjective:  Patient ID: Scott Buchanan, male    DOB: 1966/01/16  Age: 55 y.o. MRN: [id number]  CC:  Chief Complaint  Patient presents with  . Follow-up    Follow up , having so issues with htn , when 2 ed about weeks ago  follow up cardiology , and set echo next week , cgd running around 160- 190     HPI Scott Buchanan is a 55 year old male with a medical history significant for type 2 diabetes mellitus, essential hypertension, hyperlipidemia, morbid obesity, peripheral neuropathy, and history of mild intermittent asthma presents for follow-up of chronic conditions and post hospital discharge. Patient says that he was treated and evaluated in the emergency department on 06/11/2020.  He presented to the ER with shortness of breath, dizziness, and weakness for 2 days.  At that time, patient had a heart score of 5 and was referred to cardiology.  Metoprolol was added for BP and rate control.  He will be followed by cardiology and coronary CTA and echo were scheduled at that time. Patient says that symptoms have improved and he has been doing well since that time.    Diabetes He presents for his follow-up diabetic visit. He has type 2 diabetes mellitus. Associated symptoms include foot paresthesias. Pertinent negatives for diabetes include no blurred vision, no chest pain, no fatigue, no foot ulcerations, no polydipsia, no polyphagia and no polyuria. There are no hypoglycemic complications. Symptoms are stable. Pertinent negatives for diabetic complications include no CVA. Risk factors for coronary artery disease include dyslipidemia, diabetes mellitus, obesity and sedentary lifestyle. Current diabetic treatment includes oral agent (monotherapy) and diet. He is compliant with treatment most of the time. He is following a generally healthy diet. He has not had a previous visit with a dietitian. He rarely  participates in exercise. An ACE inhibitor/angiotensin II receptor blocker is being taken. He sees a podiatrist.Eye exam is current.  Hypertension This is a chronic problem. The problem is uncontrolled. Pertinent negatives include no blurred vision, chest pain or shortness of breath. There is no history of CVA or heart failure.  Hyperlipidemia This is a chronic problem. The problem is controlled. Exacerbating diseases include diabetes and obesity. He has no history of liver disease. Pertinent negatives include no chest pain or shortness of breath. Compliance problems include adherence to diet and adherence to exercise.      Past Medical History:  Diagnosis Date  . Arthritis   . Asthma   . Diabetes mellitus without complication (HCC)    type 2  . Hypertension   . Primary localized osteoarthritis of right knee 03/02/2018    Past Surgical History:  Procedure Laterality Date  . CYSTECTOMY     tailbone  . ETHMOIDECTOMY Bilateral 04/07/2017   Procedure: BILATERAL ETHMOIDECTOMY AND SPHENOIDECTOMY;  Surgeon: Newman Pies, MD;  Location: [facility];  Service: ENT;  Laterality: Bilateral;  . KNEE ARTHROSCOPY W/ MENISCAL REPAIR     right and left knee one year apart  . PARTIAL KNEE ARTHROPLASTY Right 03/02/2018   Procedure: UNICOMPARTMENTAL KNEE;  Surgeon: Teryl Lucy, MD;  Location: [facility];  Service: Orthopedics;  Laterality: Right;  with block  . SINUS ENDO WITH FUSION Bilateral 04/07/2017   Procedure: BILATERAL FRONTAL RECESS SINUS EXPLORATION WITH SINUS FUSION NAVIGATION;  Surgeon: Newman Pies, MD;  Location: [facility];  Service: ENT;  Laterality: Bilateral;  .  SINUS EXPLORATION    . TONSILLECTOMY    . TURBINATE REDUCTION Bilateral 04/07/2017   Procedure: BILATERAL TURBINATE REDUCTION;  Surgeon: Newman Pieseoh, Su, MD;  Location: [facility];  Service: ENT;  Laterality: Bilateral;  . unicompartmental right knee      Family History  Problem Relation Age of  Onset  . Hypertension Mother   . Colon cancer Neg Hx   . Colon polyps Neg Hx   . Stomach cancer Neg Hx   . Rectal cancer Neg Hx   . Esophageal cancer Neg Hx   . Inflammatory bowel disease Neg Hx   . Liver disease Neg Hx   . Pancreatic cancer Neg Hx     Social History   Socioeconomic History  . Marital status: Single    Spouse name: Not on file  . Number of children: 1  . Years of education: Not on file  . Highest education level: Not on file  Occupational History  . Occupation: maintance   Tobacco Use  . Smoking status: Former Smoker    Types: Cigarettes  . Smokeless tobacco: Never Used  . Tobacco comment: quit 10 years ago  Vaping Use  . Vaping Use: Never used  Substance and Sexual Activity  . Alcohol use: No  . Drug use: No  . Sexual activity: Yes  Other Topics Concern  . Not on file  Social History Narrative  . Not on file   Social Determinants of Health   Financial Resource Strain: Not on file  Food Insecurity: Not on file  Transportation Needs: Not on file  Physical Activity: Not on file  Stress: Not on file  Social Connections: Not on file  Intimate Partner Violence: Not on file    Outpatient Medications Prior to Visit  Medication Sig Dispense Refill  . amLODipine (NORVASC) 10 MG tablet Take 1 tablet by mouth once daily (Patient taking differently: Take 10 mg by mouth daily.) 90 tablet 0  . Ascorbic Acid (VITAMIN C) 1000 MG tablet Take 1,000 mg by mouth daily.    Marland Kitchen. aspirin EC 81 MG tablet Take 81 mg by mouth daily.    Marland Kitchen. atorvastatin (LIPITOR) 40 MG tablet Take 0.5 tablets (20 mg total) by mouth daily. 90 tablet 0  . cholecalciferol (VITAMIN D) 1000 units tablet Take 1,000 Units by mouth daily.    Marland Kitchen. dicyclomine (BENTYL) 10 MG capsule Take 1 capsule (10 mg total) by mouth 4 (four) times daily -  before meals and at bedtime. (Patient taking differently: Take 10 mg by mouth 3 (three) times daily as needed for spasms.) 60 capsule 1  . fluticasone (FLONASE) 50  MCG/ACT nasal spray Place 2 sprays into both nostrils daily.     Marland Kitchen. gabapentin (NEURONTIN) 300 MG capsule Take 1 capsule (300 mg total) by mouth at bedtime. 90 capsule 1  . hydrochlorothiazide (HYDRODIURIL) 12.5 MG tablet Take 1 tablet by mouth once daily (Patient taking differently: Take 12.5 mg by mouth daily.) 90 tablet 0  . levocetirizine (XYZAL) 5 MG tablet TAKE 1 TABLET BY MOUTH ONCE DAILY IN THE EVENING (Patient taking differently: Take 5 mg by mouth every evening.) 90 tablet 0  . metFORMIN (GLUCOPHAGE) 1000 MG tablet Take 0.5 tablets (500 mg total) by mouth 2 (two) times daily with a meal. 180 tablet 1  . metoprolol succinate (TOPROL XL) 50 MG 24 hr tablet Take 1 tablet (50 mg total) by mouth daily. Take with or immediately following a meal. 30 tablet 1  . montelukast (  SINGULAIR) 10 MG tablet TAKE 1 TABLET BY MOUTH AT BEDTIME (Patient taking differently: Take 10 mg by mouth at bedtime.) 90 tablet 0  . omeprazole (PRILOSEC) 40 MG capsule Take 1 capsule by mouth once daily (Patient taking differently: Take 40 mg by mouth daily.) 30 capsule 11  . tamsulosin (FLOMAX) 0.4 MG CAPS capsule Take 2 capsules (0.8 mg total) by mouth daily. 180 capsule 3  . TRULICITY 0.75 MG/0.5ML SOPN INJECT 0.75 MG  SUBCUTANEOUSLY ONCE A WEEK (Patient taking differently: Inject 0.75 mg into the skin once a week. Thursday) 4 mL 3  . valsartan (DIOVAN) 40 MG tablet Take 1 tablet (40 mg total) by mouth daily. 30 tablet 5  . albuterol (VENTOLIN HFA) 108 (90 Base) MCG/ACT inhaler Inhale 2 puffs into the lungs every 6 (six) hours as needed for wheezing or shortness of breath. (Patient not taking: Reported on 06/26/2020) 18 g 3   No facility-administered medications prior to visit.    No Known Allergies  ROS Review of Systems  Constitutional: Negative for fatigue.  Eyes: Negative.  Negative for blurred vision.  Respiratory: Negative for shortness of breath.   Cardiovascular: Negative for chest pain.  Gastrointestinal:  Negative.   Endocrine: Negative for polydipsia, polyphagia and polyuria.  Genitourinary: Negative.   Musculoskeletal: Negative.   Neurological: Negative.   Psychiatric/Behavioral: Negative.       Objective:    Physical Exam Constitutional:      Appearance: He is obese.  Eyes:     Pupils: Pupils are equal, round, and reactive to light.  Cardiovascular:     Rate and Rhythm: Normal rate and regular rhythm.     Pulses: Normal pulses.  Pulmonary:     Effort: Pulmonary effort is normal.  Abdominal:     General: Bowel sounds are normal.     Palpations: Abdomen is soft.  Musculoskeletal:        General: Normal range of motion.  Skin:    General: Skin is warm.  Neurological:     General: No focal deficit present.     Mental Status: Mental status is at baseline.  Psychiatric:        Behavior: Behavior normal.        Thought Content: Thought content normal.        Judgment: Judgment normal.     BP (!) 158/85 (BP Location: Left Arm, Patient Position: Sitting, Cuff Size: Large)   Pulse 88   Temp 97.7 F (36.5 C) (Temporal)   Ht  (1.93 m)   Wt (!) 344 lb (156 kg)   SpO2 97%   BMI 41.87 kg/m  Wt Readings from Last 3 Encounters:  06/26/20 (!) 344 lb (156 kg)  06/20/20 (!) 305 lb (138.3 kg)  03/13/20 (!) 334 lb 3.2 oz (151.6 kg)     Health Maintenance Due  Topic Date Due  . OPHTHALMOLOGY EXAM  04/24/2020    There are no preventive care reminders to display for this patient.  Lab Results  Component Value Date   TSH 1.270 03/13/2020   Lab Results  Component Value Date   WBC 8.1 06/11/2020   WBC 6.8 06/11/2020   HGB 15.1 06/11/2020   HGB 14.3 06/11/2020   HCT 45.4 06/11/2020   HCT 41.2 06/11/2020   MCV 86.1 06/11/2020   MCV 83.9 06/11/2020   PLT 247 06/11/2020   PLT 245 06/11/2020   Lab Results  Component Value Date   NA 139 06/11/2020   K 4.1 06/11/2020  CO2 20 (L) 06/11/2020   GLUCOSE 261 (H) 06/11/2020   BUN 18 06/11/2020   CREATININE 0.95  06/11/2020   BILITOT 0.6 06/11/2020   ALKPHOS 60 06/11/2020   AST 44 (H) 06/11/2020   ALT 79 (H) 06/11/2020   PROT 7.3 06/11/2020   ALBUMIN 4.2 06/11/2020   CALCIUM 9.9 06/11/2020   ANIONGAP 13 06/11/2020   Lab Results  Component Value Date   CHOL 177 03/13/2020   Lab Results  Component Value Date   HDL 37 (L) 03/13/2020   Lab Results  Component Value Date   LDLCALC 117 (H) 03/13/2020   Lab Results  Component Value Date   TRIG 130 03/13/2020   Lab Results  Component Value Date   CHOLHDL 4.8 03/13/2020   Lab Results  Component Value Date   HGBA1C 6.2 (A) 03/13/2020      Assessment & Plan:   Problem List Items Addressed This Visit      Cardiovascular and Mediastinum   Essential hypertension     Endocrine   Diabetes mellitus type 2 in obese (HCC) - Primary   Relevant Orders   HgB A1c (Completed)   Comprehensive metabolic panel (Completed)     Other   Hyperlipidemia LDL goal <100    Other Visit Diagnoses    Wheezing       Relevant Medications   albuterol (VENTOLIN HFA) 108 (90 Base) MCG/ACT inhaler   Hospital discharge follow-up         1. Essential hypertension BP (!) 158/85 (BP Location: Left Arm, Patient Position: Sitting, Cuff Size: Large)   Pulse 88   Temp 97.7 F (36.5 C) (Temporal)   Ht 6\' 4"  (1.93 m)   Wt (!) 344 lb (156 kg)   SpO2 97%   BMI 41.87 kg/m  - Continue medication, monitor blood pressure at home. Continue DASH diet. Reminder to go to the ER if any CP, SOB, nausea, dizziness, severe HA, changes vision/speech, left arm numbness and tingling and jaw pain.   Patient advised to follow medication regimen consistently in order to achieve positive outcomes.  Blood pressure trended down throughout appointment.  Patient scheduled to follow-up with cardiology for coronary CTA and echocardiogram.  Will defer to cardiology for further management of essential hypertension.  2. Diabetes mellitus type 2 in obese (HCC) - HgB A1c -  Comprehensive metabolic panel  3. Wheezing Patient has a history of asthma.  He says that he has not had an exacerbation over the past 6 months patient reports some wheezing when lying patient advised to continue home medications as needed - albuterol (VENTOLIN HFA) 108 (90 Base) MCG/ACT inhaler; Inhale 2 puffs into the lungs every 4 (four) hours as needed for wheezing or shortness of breath.  Dispense: 18 g; Refill: 3  4. Hyperlipidemia LDL goal <100 The 10-year ASCVD risk score DC Jr., et al., 2013) is: 16.5%   Values used to calculate the score:     Age: 34 years     Sex: Male     Is Non-Hispanic African American: No     Diabetic: Yes     Tobacco smoker: No     Systolic Blood Pressure: 158 mmHg     Is BP treated: Yes     HDL Cholesterol: 37 mg/dL     Total Cholesterol: 177 mg/dL   5. Hospital discharge follow-up Patient to follow up with cardiology as scheduled. Will follow up by phone with lab results.    Follow-up: Return in about  3 months (around 09/26/2020).    Nolon Nations  APRN, MSN, FNP-C [facility] [address] [place], [place] [address] [phone]

## 2020-06-27 LAB — COMPREHENSIVE METABOLIC PANEL
ALT: 71 IU/L — ABNORMAL HIGH (ref 0–44)
AST: 43 IU/L — ABNORMAL HIGH (ref 0–40)
Albumin/Globulin Ratio: 1.8 (ref 1.2–2.2)
Albumin: 4.6 g/dL (ref 3.8–4.9)
Alkaline Phosphatase: 71 IU/L (ref 44–121)
BUN/Creatinine Ratio: 15 (ref 9–20)
BUN: 14 mg/dL (ref 6–24)
Bilirubin Total: 0.5 mg/dL (ref 0.0–1.2)
CO2: 20 mmol/L (ref 20–29)
Calcium: 10 mg/dL (ref 8.7–10.2)
Chloride: 102 mmol/L (ref 96–106)
Creatinine, Ser: 0.91 mg/dL (ref 0.76–1.27)
Globulin, Total: 2.6 g/dL (ref 1.5–4.5)
Glucose: 164 mg/dL — ABNORMAL HIGH (ref 65–99)
Potassium: 4.1 mmol/L (ref 3.5–5.2)
Sodium: 142 mmol/L (ref 134–144)
Total Protein: 7.2 g/dL (ref 6.0–8.5)
eGFR: 101 mL/min/{1.73_m2} (ref >59)

## 2020-06-28 ENCOUNTER — Other Ambulatory Visit: Payer: Self-pay | Admitting: Family Medicine

## 2020-06-28 DIAGNOSIS — K760 Fatty (change of) liver, not elsewhere classified: Secondary | ICD-10-CM

## 2020-06-28 DIAGNOSIS — E1169 Type 2 diabetes mellitus with other specified complication: Secondary | ICD-10-CM

## 2020-06-28 DIAGNOSIS — I700 Atherosclerosis of aorta: Secondary | ICD-10-CM

## 2020-06-28 MED ORDER — METFORMIN HCL 1000 MG PO TABS: 1000.0000 mg | ORAL_TABLET | Freq: Two times a day (BID) | ORAL | 1 refills | Status: DC

## 2020-06-28 NOTE — Progress Notes (Signed)
Reviewed laboratory values, liver enzymes continue to be elevated which is consistent with history of non alcoholic hepatosteatosis (fatty liver disease). Recommend that patient continue a low fat, low carbohydrate diet divided over small meals throughout the day.  Hemoglobin a1C has increased to 8.1. Previous result was 6.2. Will increase metformin to 1000 mg BID. Continue Trulicity 0.75 mg weekly injection. Take all prescribed medications, exercise, and follow carbohydrate modified diet consistently in order to achieve positive outcomes.  Will send a referral to diabetes and nutrition.    Nolon Nations  APRN, MSN, FNP-C [facility] [address] [place], [place] [address] [phone]

## 2020-07-03 ENCOUNTER — Other Ambulatory Visit: Payer: PRIVATE HEALTH INSURANCE

## 2020-07-03 ENCOUNTER — Other Ambulatory Visit: Payer: Self-pay

## 2020-07-03 ENCOUNTER — Ambulatory Visit (HOSPITAL_COMMUNITY): Payer: PRIVATE HEALTH INSURANCE | Attending: Cardiovascular Disease

## 2020-07-03 DIAGNOSIS — R0600 Dyspnea, unspecified: Secondary | ICD-10-CM | POA: Diagnosis not present

## 2020-07-03 DIAGNOSIS — R0609 Other forms of dyspnea: Secondary | ICD-10-CM

## 2020-07-03 LAB — BASIC METABOLIC PANEL
BUN/Creatinine Ratio: 17 (ref 9–20)
BUN: 15 mg/dL (ref 6–24)
CO2: 19 mmol/L — ABNORMAL LOW (ref 20–29)
Calcium: 9.9 mg/dL (ref 8.7–10.2)
Chloride: 98 mmol/L (ref 96–106)
Creatinine, Ser: 0.89 mg/dL (ref 0.76–1.27)
Glucose: 262 mg/dL — ABNORMAL HIGH (ref 65–99)
Potassium: 4.4 mmol/L (ref 3.5–5.2)
Sodium: 139 mmol/L (ref 134–144)
eGFR: 102 mL/min/{1.73_m2} (ref >59)

## 2020-07-03 LAB — ECHOCARDIOGRAM COMPLETE
Area-P 1/2: 2.14 cm2
S' Lateral: 3 cm

## 2020-07-11 ENCOUNTER — Other Ambulatory Visit: Payer: Self-pay

## 2020-07-11 ENCOUNTER — Encounter: Payer: Self-pay | Admitting: Urology

## 2020-07-11 ENCOUNTER — Ambulatory Visit: Payer: PRIVATE HEALTH INSURANCE | Admitting: Urology

## 2020-07-11 VITALS — BP 135/85 | HR 94 | Ht 76.0 in | Wt 330.0 lb

## 2020-07-11 DIAGNOSIS — N3943 Post-void dribbling: Secondary | ICD-10-CM

## 2020-07-11 DIAGNOSIS — N401 Enlarged prostate with lower urinary tract symptoms: Secondary | ICD-10-CM | POA: Diagnosis not present

## 2020-07-11 DIAGNOSIS — R351 Nocturia: Secondary | ICD-10-CM | POA: Diagnosis not present

## 2020-07-11 DIAGNOSIS — N400 Enlarged prostate without lower urinary tract symptoms: Secondary | ICD-10-CM | POA: Diagnosis not present

## 2020-07-11 LAB — BLADDER SCAN AMB NON-IMAGING: Scan Result: 77

## 2020-07-11 MED ORDER — GEMTESA 75 MG PO TABS: 75.0000 mg | ORAL_TABLET | Freq: Every day | ORAL | 0 refills | Status: DC

## 2020-07-11 NOTE — Progress Notes (Signed)
07/11/2020 1:04 PM   Scott Buchanan 11-May-1965 [id number]  Referring provider: Massie Maroon, FNP [address] [place],  [place] [address]  Chief Complaint  Patient presents with  . Benign Prostatic Hypertrophy    HPI: 55 y.o. male presents for follow-up of BPH with LUTS.   Remains on tamsulosin 0.8 mg daily  Over the last several months has noted worsening nocturia x3-4 and return of post void dribbling  Denies history of obstructive sleep apnea/snoring  Denies dysuria, gross hematuria  No flank, abdominal or pelvic pain   PMH: Past Medical History:  Diagnosis Date  . Arthritis   . Asthma   . Diabetes mellitus without complication (HCC)    type 2  . Hypertension   . Primary localized osteoarthritis of right knee 03/02/2018    Surgical History: Past Surgical History:  Procedure Laterality Date  . CYSTECTOMY     tailbone  . ETHMOIDECTOMY Bilateral 04/07/2017   Procedure: BILATERAL ETHMOIDECTOMY AND SPHENOIDECTOMY;  Surgeon: Newman Pies, MD;  Location: [facility];  Service: ENT;  Laterality: Bilateral;  . KNEE ARTHROSCOPY W/ MENISCAL REPAIR     right and left knee one year apart  . PARTIAL KNEE ARTHROPLASTY Right 03/02/2018   Procedure: UNICOMPARTMENTAL KNEE;  Surgeon: Teryl Lucy, MD;  Location: [facility];  Service: Orthopedics;  Laterality: Right;  with block  . SINUS ENDO WITH FUSION Bilateral 04/07/2017   Procedure: BILATERAL FRONTAL RECESS SINUS EXPLORATION WITH SINUS FUSION NAVIGATION;  Surgeon: Newman Pies, MD;  Location: [facility];  Service: ENT;  Laterality: Bilateral;  . SINUS EXPLORATION    . TONSILLECTOMY    . TURBINATE REDUCTION Bilateral 04/07/2017   Procedure: BILATERAL TURBINATE REDUCTION;  Surgeon: Newman Pies, MD;  Location: [facility];  Service: ENT;  Laterality: Bilateral;  . unicompartmental right knee      Home Medications:  Allergies as of 07/11/2020   No Known Allergies      Medication List       Accurate as of July 11, 2020  1:04 PM. If you have any questions, ask your nurse or doctor.        albuterol 108 (90 Base) MCG/ACT inhaler Commonly known as: VENTOLIN HFA Inhale 2 puffs into the lungs every 4 (four) hours as needed for wheezing or shortness of breath.   amLODipine 10 MG tablet Commonly known as: NORVASC Take 1 tablet by mouth once daily   aspirin EC 81 MG tablet Take 81 mg by mouth daily.   atorvastatin 40 MG tablet Commonly known as: LIPITOR Take 0.5 tablets (20 mg total) by mouth daily.   cholecalciferol 1000 units tablet Commonly known as: VITAMIN D Take 1,000 Units by mouth daily.   dicyclomine 10 MG capsule Commonly known as: BENTYL Take 1 capsule (10 mg total) by mouth 4 (four) times daily -  before meals and at bedtime. What changed:   when to take this  reasons to take this   fluticasone 50 MCG/ACT nasal spray Commonly known as: FLONASE Place 2 sprays into both nostrils daily.   gabapentin 300 MG capsule Commonly known as: NEURONTIN Take 1 capsule (300 mg total) by mouth at bedtime.   hydrochlorothiazide 12.5 MG tablet Commonly known as: HYDRODIURIL Take 1 tablet by mouth once daily   levocetirizine 5 MG tablet Commonly known as: XYZAL TAKE 1 TABLET BY MOUTH ONCE DAILY IN THE EVENING   metFORMIN 1000 MG tablet Commonly known as: GLUCOPHAGE Take 1 tablet (  1,000 mg total) by mouth 2 (two) times daily with a meal.   metoprolol succinate 50 MG 24 hr tablet Commonly known as: Toprol XL Take 1 tablet (50 mg total) by mouth daily. Take with or immediately following a meal.   montelukast 10 MG tablet Commonly known as: SINGULAIR TAKE 1 TABLET BY MOUTH AT BEDTIME   omeprazole 40 MG capsule Commonly known as: PRILOSEC Take 1 capsule by mouth once daily   tamsulosin 0.4 MG Caps capsule Commonly known as: FLOMAX Take 2 capsules (0.8 mg total) by mouth daily.   Trulicity 0.75 MG/0.5ML Sopn Generic drug:  Dulaglutide INJECT 0.75 MG  SUBCUTANEOUSLY ONCE A WEEK What changed: See the new instructions.   valsartan 40 MG tablet Commonly known as: Diovan Take 1 tablet (40 mg total) by mouth daily.   vitamin C 1000 MG tablet Take 1,000 mg by mouth daily.       Allergies: No Known Allergies  Family History: Family History  Problem Relation Age of Onset  . Hypertension Mother   . Colon cancer Neg Hx   . Colon polyps Neg Hx   . Stomach cancer Neg Hx   . Rectal cancer Neg Hx   . Esophageal cancer Neg Hx   . Inflammatory bowel disease Neg Hx   . Liver disease Neg Hx   . Pancreatic cancer Neg Hx     Social History:  reports that he has quit smoking. His smoking use included cigarettes. He has never used smokeless tobacco. He reports that he does not drink alcohol and does not use drugs.   Physical Exam: There were no vitals taken for this visit.  Constitutional:  Alert and oriented, No acute distress. HEENT: [place] AT, moist mucus membranes.  Trachea midline, no masses. Cardiovascular: No clubbing, cyanosis, or edema. Respiratory: Normal respiratory effort, no increased work of breathing. Neurologic: Grossly intact, no focal deficits, moving all 4 extremities. Psychiatric: Normal mood and affect.   Assessment & Plan:    1.  BPH with postvoid dribbling  Worsening symptoms  Bladder scan PVR 77 mL  Does have some frequency and urgency  Will add Gemtesa 75 mg daily-samples given and he was directed to call back regarding efficacy  Decrease tamsulosin to 0.4 mg daily  2.  Nocturia  As above, worsening   Riki Altes, MD  [facility] [address] [place], [place] [address] [phone]

## 2020-07-17 ENCOUNTER — Ambulatory Visit: Payer: PRIVATE HEALTH INSURANCE | Admitting: Physician Assistant

## 2020-07-17 ENCOUNTER — Encounter: Payer: Self-pay | Admitting: Physician Assistant

## 2020-07-17 ENCOUNTER — Other Ambulatory Visit: Payer: Self-pay

## 2020-07-17 ENCOUNTER — Telehealth: Payer: Self-pay | Admitting: Physician Assistant

## 2020-07-17 VITALS — BP 150/90 | HR 88 | Ht 76.0 in | Wt 343.8 lb

## 2020-07-17 DIAGNOSIS — E785 Hyperlipidemia, unspecified: Secondary | ICD-10-CM

## 2020-07-17 DIAGNOSIS — I10 Essential (primary) hypertension: Secondary | ICD-10-CM | POA: Diagnosis not present

## 2020-07-17 DIAGNOSIS — E119 Type 2 diabetes mellitus without complications: Secondary | ICD-10-CM

## 2020-07-17 DIAGNOSIS — R079 Chest pain, unspecified: Secondary | ICD-10-CM

## 2020-07-17 DIAGNOSIS — R0683 Snoring: Secondary | ICD-10-CM

## 2020-07-17 DIAGNOSIS — R0789 Other chest pain: Secondary | ICD-10-CM

## 2020-07-17 MED ORDER — VALSARTAN 80 MG PO TABS: 80.0000 mg | ORAL_TABLET | Freq: Every day | ORAL | 3 refills | Status: DC

## 2020-07-17 NOTE — Telephone Encounter (Signed)
3.29.22 Spoke to patient to confirm date and location of myocardial perfusion at [facility] office on 4/27 & 08/16/20 LP

## 2020-07-17 NOTE — Patient Instructions (Signed)
Medication Instructions:   INCREASE Valsartan to 80 mg daily  *If you need a refill on your cardiac medications before your next appointment, please call your pharmacy*  Lab Work: NONE ordered at this time of appointment   If you have labs (blood work) drawn today and your tests are completely normal, you will receive your results only by: Marland Kitchen MyChart Message (if you have MyChart) OR . A paper copy in the mail If you have any lab test that is abnormal or we need to change your treatment, we will call you to review the results.  Testing/Procedures: Your physician has requested that you have a lexiscan myoview. For further information please visit [email or web address] Please follow instruction sheet, as given. This test is performed at [address], [place], [place] [address]   Please schedule for 1-3 weeks  Follow-Up: At [facility], you and your health needs are our priority.  As part of our continuing mission to provide you with exceptional heart care, we have created designated Provider Care Teams.  These Care Teams include your primary Cardiologist (physician) and Advanced Practice Providers (APPs -  Physician Assistants and Nurse Practitioners) who all work together to provide you with the care you need, when you need it.  Your next appointment:   3 month(s)  The format for your next appointment:   In Person  Provider:   Peter Swaziland, MD  Other Instructions

## 2020-07-17 NOTE — Progress Notes (Signed)
Cardiology Office Note:    Date:  07/19/2020   ID:  [name], DOB 1965/05/14, MRN [id number]  PCP:  Massie Maroon, FNP   [facility]  Cardiologist:  Peter Swaziland, MD  Advanced Practice Provider:  No care team member to display Electrophysiologist:  None   Referring MD: Massie Maroon, FNP   Chief Complaint  Patient presents with  . Follow-up    Seen for Dr. Swaziland    History of Present Illness:    Scott Buchanan is a 55 y.o. male with a hx of HTN, HLD, DM 2 and history of asthma.  Patient presented to the ED in February 2020 with complaint of fatigue.  Blood pressure was uncontrolled, cardiology service was consulted.  He was placed on Toprol 50 mg daily. On follow-up, he was seen by [name] on 06/20/2020, blood pressure remained poorly controlled.  He was also having atypical chest pain at the time, a coronary CT was recommended once his blood pressure is under better control.  Valsartan 40 mg daily was added to his medical regimen.  Echocardiogram obtained on 07/03/2020 showed EF 60 to 65%, no significant valve disease, mild dilatation of the aortic root measuring 39 mm. He has been seen by his urologist on 3/23, blood pressure at the time was 135/85.  Patient presents today for follow-up.  He denies any shortness of breath however he does admit to have daytime somnolence and fatigue.  He likely has obstructive sleep apnea, I recommended sleep study.  On further discussion, he continued to have intermittent burning sensation in his chest.  The characteristic of the chest discomfort is somewhat atypical as it does not worsen with physical activity.  It occurs both with exertion and at rest.  Previously, we were thinking about coronary CT, however his weight is 343 pounds, coronary CT likely will not get [place] quality image.  I recommended to do a Myoview in our [employer]. office instead.  Otherwise his blood pressure remained borderline elevated in the  130-150s, his blood pressure goal should be 110-130s range.  I recommend he increase valsartan to 80 mg daily.  Past Medical History:  Diagnosis Date  . Arthritis   . Asthma   . Diabetes mellitus without complication (HCC)    type 2  . Hypertension   . Primary localized osteoarthritis of right knee 03/02/2018    Past Surgical History:  Procedure Laterality Date  . CYSTECTOMY     tailbone  . ETHMOIDECTOMY Bilateral 04/07/2017   Procedure: BILATERAL ETHMOIDECTOMY AND SPHENOIDECTOMY;  Surgeon: Newman Pies, MD;  Location: [facility];  Service: ENT;  Laterality: Bilateral;  . KNEE ARTHROSCOPY W/ MENISCAL REPAIR     right and left knee one year apart  . PARTIAL KNEE ARTHROPLASTY Right 03/02/2018   Procedure: UNICOMPARTMENTAL KNEE;  Surgeon: Teryl Lucy, MD;  Location: [facility];  Service: Orthopedics;  Laterality: Right;  with block  . SINUS ENDO WITH FUSION Bilateral 04/07/2017   Procedure: BILATERAL FRONTAL RECESS SINUS EXPLORATION WITH SINUS FUSION NAVIGATION;  Surgeon: Newman Pies, MD;  Location: [facility];  Service: ENT;  Laterality: Bilateral;  . SINUS EXPLORATION    . TONSILLECTOMY    . TURBINATE REDUCTION Bilateral 04/07/2017   Procedure: BILATERAL TURBINATE REDUCTION;  Surgeon: Newman Pies, MD;  Location: [facility];  Service: ENT;  Laterality: Bilateral;  . unicompartmental right knee      Current Medications: Current Meds  Medication Sig  .  albuterol (VENTOLIN HFA) 108 (90 Base) MCG/ACT inhaler Inhale 2 puffs into the lungs every 4 (four) hours as needed for wheezing or shortness of breath.  Marland Kitchen. amLODipine (NORVASC) 10 MG tablet Take 1 tablet by mouth once daily (Patient taking differently: Take 10 mg by mouth daily.)  . Ascorbic Acid (VITAMIN C) 1000 MG tablet Take 1,000 mg by mouth daily.  Marland Kitchen. aspirin EC 81 MG tablet Take 81 mg by mouth daily.  Marland Kitchen. atorvastatin (LIPITOR) 40 MG tablet Take 0.5 tablets (20 mg total) by mouth daily.  .  cholecalciferol (VITAMIN D) 1000 units tablet Take 1,000 Units by mouth daily.  Marland Kitchen. dicyclomine (BENTYL) 10 MG capsule Take 1 capsule (10 mg total) by mouth 4 (four) times daily -  before meals and at bedtime. (Patient taking differently: Take 10 mg by mouth 3 (three) times daily as needed for spasms.)  . fluticasone (FLONASE) 50 MCG/ACT nasal spray Place 2 sprays into both nostrils daily.   Marland Kitchen. gabapentin (NEURONTIN) 300 MG capsule Take 1 capsule (300 mg total) by mouth at bedtime.  . hydrochlorothiazide (HYDRODIURIL) 12.5 MG tablet Take 1 tablet by mouth once daily (Patient taking differently: Take 12.5 mg by mouth daily.)  . levocetirizine (XYZAL) 5 MG tablet TAKE 1 TABLET BY MOUTH ONCE DAILY IN THE EVENING (Patient taking differently: Take 5 mg by mouth every evening.)  . metFORMIN (GLUCOPHAGE) 1000 MG tablet Take 1 tablet (1,000 mg total) by mouth 2 (two) times daily with a meal.  . metoprolol succinate (TOPROL XL) 50 MG 24 hr tablet Take 1 tablet (50 mg total) by mouth daily. Take with or immediately following a meal.  . montelukast (SINGULAIR) 10 MG tablet TAKE 1 TABLET BY MOUTH AT BEDTIME (Patient taking differently: Take 10 mg by mouth at bedtime.)  . omeprazole (PRILOSEC) 40 MG capsule Take 1 capsule by mouth once daily (Patient taking differently: Take 40 mg by mouth daily.)  . tamsulosin (FLOMAX) 0.4 MG CAPS capsule Take 2 capsules (0.8 mg total) by mouth daily. (Patient taking differently: Take 0.4 mg by mouth daily.)  . TRULICITY 0.75 MG/0.5ML SOPN INJECT 0.75 MG  SUBCUTANEOUSLY ONCE A WEEK (Patient taking differently: Inject 0.75 mg into the skin once a week. Thursday)  . Vibegron (GEMTESA) 75 MG TABS Take 75 mg by mouth daily.  . [DISCONTINUED] valsartan (DIOVAN) 40 MG tablet Take 1 tablet (40 mg total) by mouth daily.     Allergies:   Patient has no known allergies.   Social History   Socioeconomic History  . Marital status: Single    Spouse name: Not on file  . Number of  children: 1  . Years of education: Not on file  . Highest education level: Not on file  Occupational History  . Occupation: maintance   Tobacco Use  . Smoking status: Former Smoker    Types: Cigarettes  . Smokeless tobacco: Never Used  . Tobacco comment: quit 10 years ago  Vaping Use  . Vaping Use: Never used  Substance and Sexual Activity  . Alcohol use: No  . Drug use: No  . Sexual activity: Yes  Other Topics Concern  . Not on file  Social History Narrative  . Not on file   Social Determinants of Health   Financial Resource Strain: Not on file  Food Insecurity: Not on file  Transportation Needs: Not on file  Physical Activity: Not on file  Stress: Not on file  Social Connections: Not on file     Family History:  The patient's family history includes Hypertension in his mother. There is no history of Colon cancer, Colon polyps, Stomach cancer, Rectal cancer, Esophageal cancer, Inflammatory bowel disease, Liver disease, or Pancreatic cancer.  ROS:   Please see the history of present illness.     All other systems reviewed and are negative.  EKGs/Labs/Other Studies Reviewed:    The following studies were reviewed today:  Echo 07/03/2020 IMPRESSIONS    1. Left ventricular ejection fraction, by estimation, is 60 to 65%. The  left ventricle has normal function. The left ventricle has no regional  wall motion abnormalities. There is mild concentric left ventricular  hypertrophy. Left ventricular diastolic  parameters are consistent with Grade II diastolic dysfunction  (pseudonormalization).  2. Right ventricular systolic function is normal. The right ventricular  size is normal. There is normal pulmonary artery systolic pressure.  3. Left atrial size was moderately dilated.  4. The mitral valve is normal in structure. No evidence of mitral valve  regurgitation. No evidence of mitral stenosis.  5. The aortic valve is tricuspid. Aortic valve regurgitation is not   visualized. No aortic stenosis is present.  6. Aortic dilatation noted. There is mild dilatation of the aortic root,  measuring 39 mm.  7. The inferior vena cava is normal in size with greater than 50%  respiratory variability, suggesting right atrial pressure of 3 mmHg.   EKG:  EKG is not not ordered today.    Recent Labs: 03/13/2020: TSH 1.270 06/11/2020: B Natriuretic Peptide 8.4; Hemoglobin 15.1; Hemoglobin 14.3; Platelets 247; Platelets 245 06/26/2020: ALT 71 07/03/2020: BUN 15; Creatinine, Ser 0.89; Potassium 4.4; Sodium 139  Recent Lipid Panel    Component Value Date/Time   CHOL 177 03/13/2020 1044   TRIG 130 03/13/2020 1044   HDL 37 (L) 03/13/2020 1044   CHOLHDL 4.8 03/13/2020 1044   LDLCALC 117 (H) 03/13/2020 1044     Risk Assessment/Calculations:       Physical Exam:    VS:  BP (!) 150/90   Pulse 88   Ht 6\' 4"  (1.93 m)   Wt (!) 343 lb 12.8 oz (155.9 kg)   SpO2 97%   BMI 41.85 kg/m     Wt Readings from Last 3 Encounters:  07/17/20 (!) 343 lb 12.8 oz (155.9 kg)  07/11/20 (!) 330 lb (149.7 kg)  06/26/20 (!) 344 lb (156 kg)     GEN:  Well nourished, well developed in no acute distress HEENT: Normal NECK: No JVD; No carotid bruits LYMPHATICS: No lymphadenopathy CARDIAC: RRR, no murmurs, rubs, gallops RESPIRATORY:  Clear to auscultation without rales, wheezing or rhonchi  ABDOMEN: Soft, non-tender, non-distended MUSCULOSKELETAL:  No edema; No deformity  SKIN: Warm and dry NEUROLOGIC:  Alert and oriented x 3 PSYCHIATRIC:  Normal affect   ASSESSMENT:    1. Chest pain, unspecified type   2. Essential hypertension   3. Hyperlipidemia LDL goal <100   4. Controlled type 2 diabetes mellitus without complication, without long-term current use of insulin (HCC)   5. Snoring    PLAN:    In order of problems listed above:  1. Chest pain: Symptom appears to be atypical and does not correlate with the degree of exertion.  I recommend proceed with today  Myoview  2. Hypertension: Blood pressure stable  3. Hyperlipidemia: On Lipitor  4. DM2: Managed by primary care provider  5. Snoring: Patient likely has obstructive sleep apnea, I recommended proceed with sleep study.   Shared Decision Making/Informed Consent The risks [  chest pain, shortness of breath, cardiac arrhythmias, dizziness, blood pressure fluctuations, myocardial infarction, stroke/transient ischemic attack, nausea, vomiting, allergic reaction, radiation exposure, metallic taste sensation and life-threatening complications (estimated to be 1 in 10,000)], benefits (risk stratification, diagnosing coronary artery disease, treatment guidance) and alternatives of a nuclear stress test were discussed in detail with Mr. Fullen and he agrees to proceed.       Medication Adjustments/Labs and Tests Ordered: Current medicines are reviewed at length with the patient today.  Concerns regarding medicines are outlined above.  Orders Placed This Encounter  Procedures  . MYOCARDIAL PERFUSION IMAGING  . EKG 12-Lead   Meds ordered this encounter  Medications  . valsartan (DIOVAN) 80 MG tablet    Sig: Take 1 tablet (80 mg total) by mouth daily.    Dispense:  90 tablet    Refill:  3    Patient Instructions  Medication Instructions:   INCREASE Valsartan to 80 mg daily  *If you need a refill on your cardiac medications before your next appointment, please call your pharmacy*  Lab Work: NONE ordered at this time of appointment   If you have labs (blood work) drawn today and your tests are completely normal, you will receive your results only by: Marland Kitchen MyChart Message (if you have MyChart) OR . A paper copy in the mail If you have any lab test that is abnormal or we need to change your treatment, we will call you to review the results.  Testing/Procedures: Your physician has requested that you have a lexiscan myoview. For further information please visit [email or web address] Please  follow instruction sheet, as given. This test is performed at [address], [place], [place] [address]   Please schedule for 1-3 weeks  Follow-Up: At [facility], you and your health needs are our priority.  As part of our continuing mission to provide you with exceptional heart care, we have created designated Provider Care Teams.  These Care Teams include your primary Cardiologist (physician) and Advanced Practice Providers (APPs -  Physician Assistants and Nurse Practitioners) who all work together to provide you with the care you need, when you need it.  Your next appointment:   3 month(s)  The format for your next appointment:   In Person  Provider:   Peter Swaziland, MD  Other Instructions      Signed, Azalee Course, PA  07/19/2020 10:22 AM    [facility]

## 2020-07-19 ENCOUNTER — Encounter: Payer: Self-pay | Admitting: Physician Assistant

## 2020-07-30 ENCOUNTER — Telehealth: Payer: Self-pay

## 2020-07-30 ENCOUNTER — Other Ambulatory Visit: Payer: Self-pay | Admitting: Urology

## 2020-07-30 ENCOUNTER — Other Ambulatory Visit: Payer: Self-pay

## 2020-07-30 DIAGNOSIS — R351 Nocturia: Secondary | ICD-10-CM

## 2020-07-30 DIAGNOSIS — N3943 Post-void dribbling: Secondary | ICD-10-CM

## 2020-07-30 DIAGNOSIS — R8281 Pyuria: Secondary | ICD-10-CM

## 2020-07-30 DIAGNOSIS — N401 Enlarged prostate with lower urinary tract symptoms: Secondary | ICD-10-CM

## 2020-07-30 DIAGNOSIS — I10 Essential (primary) hypertension: Secondary | ICD-10-CM

## 2020-07-30 MED ORDER — TAMSULOSIN HCL 0.4 MG PO CAPS: 0.4000 mg | ORAL_CAPSULE | Freq: Every day | ORAL | 3 refills | Status: DC

## 2020-07-30 MED ORDER — HYDROCHLOROTHIAZIDE 12.5 MG PO TABS: 12.5000 mg | ORAL_TABLET | Freq: Every day | ORAL | 3 refills | Status: DC

## 2020-07-30 MED ORDER — GEMTESA 75 MG PO TABS: 75.0000 mg | ORAL_TABLET | Freq: Every day | ORAL | 11 refills | Status: DC

## 2020-07-30 NOTE — Telephone Encounter (Signed)
Patient called-stated insurance will not cover Gemtesa-will cover Oxybutynin.

## 2020-07-30 NOTE — Telephone Encounter (Signed)
Incoming call from pt on triage line requesting refill on Flomax, and also states that Leslye Peer has worked well for him and would like to proceed with getting an RX. Per provider's last note RX sent. Flomax rx changed to 1 QD per provider note.

## 2020-07-31 MED ORDER — OXYBUTYNIN CHLORIDE ER 10 MG PO TB24: 10.0000 mg | ORAL_TABLET | Freq: Every day | ORAL | 1 refills | Status: DC

## 2020-07-31 NOTE — Addendum Note (Signed)
Addended by: Riki Altes on: 07/31/2020 09:55 PM   Modules accepted: Orders

## 2020-07-31 NOTE — Telephone Encounter (Signed)
Please let patient know that Scott Buchanan is not covered by his insurance.  I sent in an Rx oxybutynin to pharmacy.  Needs a PA follow-up in 4 weeks with a bladder scan and symptom check

## 2020-08-01 NOTE — Telephone Encounter (Signed)
Notified patient as instructed, patient pleased. Discussed follow-up appointments, patient agrees  

## 2020-08-08 ENCOUNTER — Encounter (HOSPITAL_COMMUNITY): Payer: Self-pay | Admitting: *Deleted

## 2020-08-08 ENCOUNTER — Other Ambulatory Visit: Payer: Self-pay | Admitting: Cardiology

## 2020-08-08 ENCOUNTER — Telehealth (HOSPITAL_COMMUNITY): Payer: Self-pay | Admitting: *Deleted

## 2020-08-08 NOTE — Telephone Encounter (Signed)
This is Dr. Jordan's pt. °

## 2020-08-08 NOTE — Telephone Encounter (Signed)
Patient given detailed instructions per Myocardial Perfusion Study Information Sheet for the test on 08/15/20 at 1315. Patient notified to arrive 15 minutes early and that it is imperative to arrive on time for appointment to keep from having the test rescheduled.  If you need to cancel or reschedule your appointment, please call the office within 24 hours of your appointment. . Patient verbalized understanding.Linell Shawn, Adelene Idler Mychart letter sent with instructions.

## 2020-08-09 MED ORDER — METOPROLOL SUCCINATE ER 50 MG PO TB24
50.0000 mg | ORAL_TABLET | Freq: Every day | ORAL | 1 refills | Status: DC
Start: 2020-08-09 — End: 2020-10-02

## 2020-08-09 NOTE — Addendum Note (Signed)
Addended by: Margaret Pyle D on: 08/09/2020 07:38 AM   Modules accepted: Orders

## 2020-08-09 NOTE — Telephone Encounter (Signed)
This is Dr. Elvis Coil pt, he is the main Cardiologist and pt has been seen at the [facility] office by [facility]. Please refill pt's medication. Thanks

## 2020-08-15 ENCOUNTER — Ambulatory Visit (HOSPITAL_COMMUNITY): Payer: PRIVATE HEALTH INSURANCE | Attending: Cardiovascular Disease

## 2020-08-15 ENCOUNTER — Other Ambulatory Visit: Payer: Self-pay

## 2020-08-15 DIAGNOSIS — R079 Chest pain, unspecified: Secondary | ICD-10-CM | POA: Insufficient documentation

## 2020-08-15 MED ORDER — TECHNETIUM TC 99M TETROFOSMIN IV KIT
31.2000 | PACK | Freq: Once | INTRAVENOUS | Status: AC | PRN
  Administered 2020-08-15: 31.2 via INTRAVENOUS
  Filled 2020-08-15: qty 32

## 2020-08-15 MED ORDER — REGADENOSON 0.4 MG/5ML IV SOLN
0.4000 mg | Freq: Once | INTRAVENOUS | Status: AC
  Administered 2020-08-15: 0.4 mg via INTRAVENOUS

## 2020-08-16 ENCOUNTER — Ambulatory Visit (HOSPITAL_COMMUNITY): Payer: PRIVATE HEALTH INSURANCE | Attending: Internal Medicine

## 2020-08-16 LAB — MYOCARDIAL PERFUSION IMAGING
LV dias vol: 162 mL (ref 62–150)
LV sys vol: 79 mL
Peak HR: 101 {beats}/min
Rest HR: 76 {beats}/min
SDS: 2
SRS: 0
SSS: 2
TID: 1.18

## 2020-08-16 MED ORDER — TECHNETIUM TC 99M TETROFOSMIN IV KIT
32.0000 | PACK | Freq: Once | INTRAVENOUS | Status: AC | PRN
  Administered 2020-08-16: 32 via INTRAVENOUS
  Filled 2020-08-16: qty 32

## 2020-08-22 ENCOUNTER — Other Ambulatory Visit: Payer: Self-pay | Admitting: Family Medicine

## 2020-08-22 DIAGNOSIS — E1169 Type 2 diabetes mellitus with other specified complication: Secondary | ICD-10-CM

## 2020-08-22 NOTE — Progress Notes (Signed)
Terrah, please arrange earlier visit to discuss myoview result and set up cath

## 2020-08-27 ENCOUNTER — Other Ambulatory Visit: Payer: Self-pay

## 2020-08-27 ENCOUNTER — Encounter: Payer: Self-pay | Admitting: Physician Assistant

## 2020-08-27 ENCOUNTER — Ambulatory Visit (INDEPENDENT_AMBULATORY_CARE_PROVIDER_SITE_OTHER): Payer: PRIVATE HEALTH INSURANCE | Admitting: Physician Assistant

## 2020-08-27 VITALS — BP 128/82 | HR 82 | Ht 76.0 in | Wt 342.2 lb

## 2020-08-27 DIAGNOSIS — R9439 Abnormal result of other cardiovascular function study: Secondary | ICD-10-CM | POA: Diagnosis not present

## 2020-08-27 DIAGNOSIS — Z01818 Encounter for other preprocedural examination: Secondary | ICD-10-CM

## 2020-08-27 DIAGNOSIS — I10 Essential (primary) hypertension: Secondary | ICD-10-CM | POA: Diagnosis not present

## 2020-08-27 DIAGNOSIS — E785 Hyperlipidemia, unspecified: Secondary | ICD-10-CM

## 2020-08-27 DIAGNOSIS — E119 Type 2 diabetes mellitus without complications: Secondary | ICD-10-CM

## 2020-08-27 NOTE — H&P (View-Only) (Signed)
Cardiology Office Note:    Date:  08/29/2020   ID:  Scott Buchanan, DOB December 04, 1965, MRN [id number]  PCP:  Massie Maroon, FNP   [facility] Providers Cardiologist:  Peter Swaziland, MD {   Referring MD: Massie Maroon, FNP   Chief Complaint  Patient presents with  . Follow-up    Seen for Dr. Swaziland    History of Present Illness:    Scott Buchanan is a 55 y.o. male with a hx of HTN, HLD, DM 2 and history of asthma.  Patient presented to the ED in February 2020 with complaint of fatigue.  Blood pressure was uncontrolled, cardiology service was consulted.  He was placed on Toprol 50 mg daily. On follow-up, he was seen by [name] on 06/20/2020, blood pressure remained poorly controlled.  He was also having atypical chest pain at the time, a coronary CT was recommended once his blood pressure is under better control.  Valsartan 40 mg daily was added to his medical regimen.  Echocardiogram obtained on 07/03/2020 showed EF 60 to 65%, no significant valve disease, mild dilatation of the aortic root measuring 39 mm. He has been seen by his urologist on 3/23, blood pressure at the time was 135/85.  I last saw the patient on 07/17/2020, at which time I recommended sleep study and Myoview for his atypical chest pain.  I will increase his valsartan to 80 mg daily.  Myoview came back abnormal with EF 51%, medium fixed defect of mild severity present in the inferior wall from base to apex suggestive of prior infarct, medium partially reversible defect of moderate severity present in the basal anteroseptal, mild anteroseptal and apical septal location consistent with previous infarct with peri-infarct ischemia, overall intermediate risk study.  The study was reviewed by Dr. Swaziland who recommended cardiac catheterization.  Patient presents today for follow-up.  He continues to have intermittent chest discomfort.  He has no lower extremity edema, orthopnea or PND.  We discussed the recent Myoview result  and recommended cardiac catheterization.  He is agreeable to proceed.  We will arrange cardiac cath with Dr. Swaziland.   Past Medical History:  Diagnosis Date  . Arthritis   . Asthma   . Diabetes mellitus without complication (HCC)    type 2  . Hypertension   . Primary localized osteoarthritis of right knee 03/02/2018    Past Surgical History:  Procedure Laterality Date  . CYSTECTOMY     tailbone  . ETHMOIDECTOMY Bilateral 04/07/2017   Procedure: BILATERAL ETHMOIDECTOMY AND SPHENOIDECTOMY;  Surgeon: Newman Pies, MD;  Location: [facility];  Service: ENT;  Laterality: Bilateral;  . KNEE ARTHROSCOPY W/ MENISCAL REPAIR     right and left knee one year apart  . PARTIAL KNEE ARTHROPLASTY Right 03/02/2018   Procedure: UNICOMPARTMENTAL KNEE;  Surgeon: Teryl Lucy, MD;  Location: [facility];  Service: Orthopedics;  Laterality: Right;  with block  . SINUS ENDO WITH FUSION Bilateral 04/07/2017   Procedure: BILATERAL FRONTAL RECESS SINUS EXPLORATION WITH SINUS FUSION NAVIGATION;  Surgeon: Newman Pies, MD;  Location: [facility];  Service: ENT;  Laterality: Bilateral;  . SINUS EXPLORATION    . TONSILLECTOMY    . TURBINATE REDUCTION Bilateral 04/07/2017   Procedure: BILATERAL TURBINATE REDUCTION;  Surgeon: Newman Pies, MD;  Location: [facility];  Service: ENT;  Laterality: Bilateral;  . unicompartmental right knee      Current Medications: Current Meds  Medication Sig  . albuterol (VENTOLIN HFA)  108 (90 Base) MCG/ACT inhaler Inhale 2 puffs into the lungs every 4 (four) hours as needed for wheezing or shortness of breath.  Marland Kitchen. amLODipine (NORVASC) 10 MG tablet Take 1 tablet by mouth once daily (Patient taking differently: Take 10 mg by mouth daily.)  . Ascorbic Acid (VITAMIN C) 1000 MG tablet Take 1,000 mg by mouth daily.  Marland Kitchen. aspirin EC 81 MG tablet Take 81 mg by mouth daily.  Marland Kitchen. atorvastatin (LIPITOR) 40 MG tablet Take 0.5 tablets (20 mg total) by mouth daily.  .  cholecalciferol (VITAMIN D) 1000 units tablet Take 1,000 Units by mouth daily.  Marland Kitchen. dicyclomine (BENTYL) 10 MG capsule Take 1 capsule (10 mg total) by mouth 4 (four) times daily -  before meals and at bedtime. (Patient taking differently: Take 10 mg by mouth 3 (three) times daily as needed for spasms.)  . fluticasone (FLONASE) 50 MCG/ACT nasal spray Place 2 sprays into both nostrils at bedtime.  . hydrochlorothiazide (HYDRODIURIL) 12.5 MG tablet Take 1 tablet (12.5 mg total) by mouth daily.  Marland Kitchen. levocetirizine (XYZAL) 5 MG tablet TAKE 1 TABLET BY MOUTH ONCE DAILY IN THE EVENING (Patient taking differently: Take 5 mg by mouth every evening.)  . metFORMIN (GLUCOPHAGE) 1000 MG tablet Take 1 tablet (1,000 mg total) by mouth 2 (two) times daily with a meal.  . metoprolol succinate (TOPROL XL) 50 MG 24 hr tablet Take 1 tablet (50 mg total) by mouth daily. Take with or immediately following a meal. (Patient taking differently: Take 50 mg by mouth daily with supper. Take with or immediately following a meal.)  . montelukast (SINGULAIR) 10 MG tablet TAKE 1 TABLET BY MOUTH AT BEDTIME (Patient taking differently: Take 10 mg by mouth at bedtime.)  . omeprazole (PRILOSEC) 40 MG capsule Take 1 capsule by mouth once daily (Patient taking differently: Take 40 mg by mouth at bedtime.)  . oxybutynin (DITROPAN-XL) 10 MG 24 hr tablet Take 1 tablet (10 mg total) by mouth daily.  . tamsulosin (FLOMAX) 0.4 MG CAPS capsule Take 1 capsule (0.4 mg total) by mouth daily.  . TRULICITY 0.75 MG/0.5ML SOPN INJECT 0.75 MG SUBCUTANEOUSLY ONCE A WEEK (Patient taking differently: Inject 0.75 mg as directed every Thursday.)  . valsartan (DIOVAN) 80 MG tablet Take 1 tablet (80 mg total) by mouth daily. (Patient taking differently: Take 80 mg by mouth every evening.)     Allergies:   Patient has no known allergies.   Social History   Socioeconomic History  . Marital status: Single    Spouse name: Not on file  . Number of children: 1   . Years of education: Not on file  . Highest education level: Not on file  Occupational History  . Occupation: maintance   Tobacco Use  . Smoking status: Former Smoker    Types: Cigarettes  . Smokeless tobacco: Never Used  . Tobacco comment: quit 10 years ago  Vaping Use  . Vaping Use: Never used  Substance and Sexual Activity  . Alcohol use: No  . Drug use: No  . Sexual activity: Yes  Other Topics Concern  . Not on file  Social History Narrative  . Not on file   Social Determinants of Health   Financial Resource Strain: Not on file  Food Insecurity: Not on file  Transportation Needs: Not on file  Physical Activity: Not on file  Stress: Not on file  Social Connections: Not on file     Family History: The patient's family history includes Hypertension in  his mother. There is no history of Colon cancer, Colon polyps, Stomach cancer, Rectal cancer, Esophageal cancer, Inflammatory bowel disease, Liver disease, or Pancreatic cancer.  ROS:   Please see the history of present illness.     All other systems reviewed and are negative.  EKGs/Labs/Other Studies Reviewed:    The following studies were reviewed today:  Echo 07/03/2020 1. Left ventricular ejection fraction, by estimation, is 60 to 65%. The  left ventricle has normal function. The left ventricle has no regional  wall motion abnormalities. There is mild concentric left ventricular  hypertrophy. Left ventricular diastolic  parameters are consistent with Grade II diastolic dysfunction  (pseudonormalization).  2. Right ventricular systolic function is normal. The right ventricular  size is normal. There is normal pulmonary artery systolic pressure.  3. Left atrial size was moderately dilated.  4. The mitral valve is normal in structure. No evidence of mitral valve  regurgitation. No evidence of mitral stenosis.  5. The aortic valve is tricuspid. Aortic valve regurgitation is not  visualized. No aortic stenosis  is present.  6. Aortic dilatation noted. There is mild dilatation of the aortic root,  measuring 39 mm.  7. The inferior vena cava is normal in size with greater than 50%  respiratory variability, suggesting right atrial pressure of 3 mmHg.   Myoview 08/16/2020  Nuclear stress EF: 51%.  There was no ST segment deviation noted during stress.  Findings consistent with ischemia and prior myocardial infarction.  This is an intermediate risk study.  The left ventricular ejection fraction is mildly decreased (45-54%).   Defect 1: There is a medium fixed defect of mild severity present in the inferior wall from base to apex, suggests prior infarct. Hypokinesis of the inferior wall.   Defect 2: There is a medium, partially reversible defect of moderate severity present in the basal anteroseptal, mid anteroseptal and apical septal location, suggests prior infarct with periinfarct ischemia.   This is an intermediate risk study.    EKG:  EKG is ordered today.  The ekg ordered today demonstrates normal sinus rhythm, T wave inversion in the lateral leads.  Recent Labs: 03/13/2020: TSH 1.270 06/11/2020: B Natriuretic Peptide 8.4; Hemoglobin 15.1; Hemoglobin 14.3; Platelets 247; Platelets 245 06/26/2020: ALT 71 07/03/2020: BUN 15; Creatinine, Ser 0.89; Potassium 4.4; Sodium 139  Recent Lipid Panel    Component Value Date/Time   CHOL 177 03/13/2020 1044   TRIG 130 03/13/2020 1044   HDL 37 (L) 03/13/2020 1044   CHOLHDL 4.8 03/13/2020 1044   LDLCALC 117 (H) 03/13/2020 1044     Risk Assessment/Calculations:       Physical Exam:    VS:  BP 128/82   Pulse 82   Ht 6\' 4"  (1.93 m)   Wt (!) 342 lb 3.2 oz (155.2 kg)   SpO2 97%   BMI 41.65 kg/m     Wt Readings from Last 3 Encounters:  08/27/20 (!) 342 lb 3.2 oz (155.2 kg)  08/15/20 (!) 343 lb (155.6 kg)  07/17/20 (!) 343 lb 12.8 oz (155.9 kg)     GEN:  Well nourished, well developed in no acute distress HEENT: Normal NECK: No  JVD; No carotid bruits LYMPHATICS: No lymphadenopathy CARDIAC: RRR, no murmurs, rubs, gallops RESPIRATORY:  Clear to auscultation without rales, wheezing or rhonchi  ABDOMEN: Soft, non-tender, non-distended MUSCULOSKELETAL:  No edema; No deformity  SKIN: Warm and dry NEUROLOGIC:  Alert and oriented x 3 PSYCHIATRIC:  Normal affect   ASSESSMENT:    1.  Abnormal stress test   2. Pre-procedural examination   3. Essential hypertension   4. Hyperlipidemia LDL goal <100   5. Controlled type 2 diabetes mellitus without complication, without long-term current use of insulin (HCC)    PLAN:    In order of problems listed above:  1. Abnormal stress test: We have reviewed the recent Myoview result.  He continued to have the intermittent chest discomfort.  We discussed cardiac catheterization as recommended by Dr. Swaziland, he is agreeable to proceed.  2. Hypertension: Blood pressure stable  3. Hyperlipidemia: On Lipitor.  4. DM2: Managed by primary care provider   Shared Decision Making/Informed Consent The risks [stroke (1 in 1000), death (1 in 1000), kidney failure [usually temporary] (1 in 500), bleeding (1 in 200), allergic reaction [possibly serious] (1 in 200)], benefits (diagnostic support and management of coronary artery disease) and alternatives of a cardiac catheterization were discussed in detail with Scott Buchanan and he is willing to proceed.        Medication Adjustments/Labs and Tests Ordered: Current medicines are reviewed at length with the patient today.  Concerns regarding medicines are outlined above.  Orders Placed This Encounter  Procedures  . Basic metabolic panel  . CBC  . EKG 12-Lead   No orders of the defined types were placed in this encounter.   Patient Instructions  Medication Instructions:  Your physician recommends that you continue on your current medications as directed. Please refer to the Current Medication list given to you today.  *If you need  a refill on your cardiac medications before your next appointment, please call your pharmacy*  Lab Work: Your physician recommends that you return for lab work on Friday 08/31/20:   BMET  CBC  You will need a COVID-19  test prior to your procedure. You are scheduled for Friday 08/31/20 at 8:35 AM. This is a Drive Up Visit at [address] [place], [place] [address]. Someone will direct you to the appropriate testing line. Stay in your car and someone will be with you shortly.  If you have labs (blood work) drawn today and your tests are completely normal, you will receive your results only by: Marland Kitchen MyChart Message (if you have MyChart) OR . A paper copy in the mail If you have any lab test that is abnormal or we need to change your treatment, we will call you to review the results.  Testing/Procedures: Your physician has requested that you have a cardiac catheterization. Cardiac catheterization is used to diagnose and/or treat various heart conditions. Doctors may recommend this procedure for a number of different reasons. The most common reason is to evaluate chest pain. Chest pain can be a symptom of coronary artery disease (CAD), and cardiac catheterization can show whether plaque is narrowing or blocking your heart's arteries. This procedure is also used to evaluate the valves, as well as measure the blood flow and oxygen levels in different parts of your heart. For further information please visit [email or web address] Please follow instruction sheet, as given.    Follow-Up: At [facility], you and your health needs are our priority.  As part of our continuing mission to provide you with exceptional heart care, we have created designated Provider Care Teams.  These Care Teams include your primary Cardiologist (physician) and Advanced Practice Providers (APPs -  Physician Assistants and Nurse Practitioners) who all work together to provide you with the care you need, when you need  it.  Your next appointment:   3-4  week(s)  The format for your next appointment:   In Person  Provider:   Peter Swaziland, MD or Azalee Course, PA-C   Other Instructions       [facility] CARDIOVASCULAR DIVISION [facility] [address] [place] [address] [place] [address] Dept: [phone] Loc: [phone]  Scott Buchanan  08/27/2020  You are scheduled for a Cardiac Catheterization on Tuesday, May 17 with Dr. Peter Swaziland.  1. Please arrive at the [facility] (Main Entrance A) at [facility]: [address] [place], [place] [address] at 7:00 AM (This time is two hours before your procedure to ensure your preparation). Free valet parking service is available.   Special note: Every effort is made to have your procedure done on time. Please understand that emergencies sometimes delay scheduled procedures.  2. Diet: Do not eat solid foods after midnight.  The patient may have clear liquids until 5am upon the day of the procedure.  3. Labs: You will need to have blood drawn on Friday, May 13 at [facility] [address], [place]  Open: 8 AM - 4:30 PM (Lunch 12:30 - 1:30)   Phone: [phone]. You do not need to be fasting.  4. Medication instructions in preparation for your procedure:   Contrast Allergy: No   Stop taking, Diovan (Valsartan) Monday, May 16,, HTCZ (Hydrochlorothiazide) Monday, May 16,  Do not take Diabetes Med Glucophage (Metformin) on the day of the procedure and HOLD 48 HOURS AFTER THE PROCEDURE.  On the morning of your procedure, take your Aspirin and any morning medicines NOT listed above.  You may use sips of water.  5. Plan for one night stay--bring personal belongings. 6. Bring a current list of your medications and current insurance cards. 7. You MUST have a responsible person to drive you home. 8. Someone MUST be with you the first 24 hours after you arrive home or your discharge will be  delayed. 9. Please wear clothes that are easy to get on and off and wear slip-on shoes.  Thank you for allowing [place] to care for you!   -- [facility] Invasive Cardiovascular services      Signed, Azalee Course, [place]  08/29/2020 11:09 PM    [facility]

## 2020-08-27 NOTE — Progress Notes (Signed)
Cardiology Office Note:    Date:  08/29/2020   ID:  Scott Buchanan, DOB December 04, 1965, MRN [id number]  PCP:  Massie Maroon, FNP   [facility] Providers Cardiologist:  Peter Swaziland, MD {   Referring MD: Massie Maroon, FNP   Chief Complaint  Patient presents with  . Follow-up    Seen for Dr. Swaziland    History of Present Illness:    Scott Buchanan is a 55 y.o. male with a hx of HTN, HLD, DM 2 and history of asthma.  Patient presented to the ED in February 2020 with complaint of fatigue.  Blood pressure was uncontrolled, cardiology service was consulted.  He was placed on Toprol 50 mg daily. On follow-up, he was seen by [name] on 06/20/2020, blood pressure remained poorly controlled.  He was also having atypical chest pain at the time, a coronary CT was recommended once his blood pressure is under better control.  Valsartan 40 mg daily was added to his medical regimen.  Echocardiogram obtained on 07/03/2020 showed EF 60 to 65%, no significant valve disease, mild dilatation of the aortic root measuring 39 mm. He has been seen by his urologist on 3/23, blood pressure at the time was 135/85.  I last saw the patient on 07/17/2020, at which time I recommended sleep study and Myoview for his atypical chest pain.  I will increase his valsartan to 80 mg daily.  Myoview came back abnormal with EF 51%, medium fixed defect of mild severity present in the inferior wall from base to apex suggestive of prior infarct, medium partially reversible defect of moderate severity present in the basal anteroseptal, mild anteroseptal and apical septal location consistent with previous infarct with peri-infarct ischemia, overall intermediate risk study.  The study was reviewed by Dr. Swaziland who recommended cardiac catheterization.  Patient presents today for follow-up.  He continues to have intermittent chest discomfort.  He has no lower extremity edema, orthopnea or PND.  We discussed the recent Myoview result  and recommended cardiac catheterization.  He is agreeable to proceed.  We will arrange cardiac cath with Dr. Swaziland.   Past Medical History:  Diagnosis Date  . Arthritis   . Asthma   . Diabetes mellitus without complication (HCC)    type 2  . Hypertension   . Primary localized osteoarthritis of right knee 03/02/2018    Past Surgical History:  Procedure Laterality Date  . CYSTECTOMY     tailbone  . ETHMOIDECTOMY Bilateral 04/07/2017   Procedure: BILATERAL ETHMOIDECTOMY AND SPHENOIDECTOMY;  Surgeon: Newman Pies, MD;  Location: [facility];  Service: ENT;  Laterality: Bilateral;  . KNEE ARTHROSCOPY W/ MENISCAL REPAIR     right and left knee one year apart  . PARTIAL KNEE ARTHROPLASTY Right 03/02/2018   Procedure: UNICOMPARTMENTAL KNEE;  Surgeon: Teryl Lucy, MD;  Location: [facility];  Service: Orthopedics;  Laterality: Right;  with block  . SINUS ENDO WITH FUSION Bilateral 04/07/2017   Procedure: BILATERAL FRONTAL RECESS SINUS EXPLORATION WITH SINUS FUSION NAVIGATION;  Surgeon: Newman Pies, MD;  Location: [facility];  Service: ENT;  Laterality: Bilateral;  . SINUS EXPLORATION    . TONSILLECTOMY    . TURBINATE REDUCTION Bilateral 04/07/2017   Procedure: BILATERAL TURBINATE REDUCTION;  Surgeon: Newman Pies, MD;  Location: [facility];  Service: ENT;  Laterality: Bilateral;  . unicompartmental right knee      Current Medications: Current Meds  Medication Sig  . albuterol (VENTOLIN HFA)  108 (90 Base) MCG/ACT inhaler Inhale 2 puffs into the lungs every 4 (four) hours as needed for wheezing or shortness of breath.  Marland Kitchen. amLODipine (NORVASC) 10 MG tablet Take 1 tablet by mouth once daily (Patient taking differently: Take 10 mg by mouth daily.)  . Ascorbic Acid (VITAMIN C) 1000 MG tablet Take 1,000 mg by mouth daily.  Marland Kitchen. aspirin EC 81 MG tablet Take 81 mg by mouth daily.  Marland Kitchen. atorvastatin (LIPITOR) 40 MG tablet Take 0.5 tablets (20 mg total) by mouth daily.  .  cholecalciferol (VITAMIN D) 1000 units tablet Take 1,000 Units by mouth daily.  Marland Kitchen. dicyclomine (BENTYL) 10 MG capsule Take 1 capsule (10 mg total) by mouth 4 (four) times daily -  before meals and at bedtime. (Patient taking differently: Take 10 mg by mouth 3 (three) times daily as needed for spasms.)  . fluticasone (FLONASE) 50 MCG/ACT nasal spray Place 2 sprays into both nostrils at bedtime.  . hydrochlorothiazide (HYDRODIURIL) 12.5 MG tablet Take 1 tablet (12.5 mg total) by mouth daily.  Marland Kitchen. levocetirizine (XYZAL) 5 MG tablet TAKE 1 TABLET BY MOUTH ONCE DAILY IN THE EVENING (Patient taking differently: Take 5 mg by mouth every evening.)  . metFORMIN (GLUCOPHAGE) 1000 MG tablet Take 1 tablet (1,000 mg total) by mouth 2 (two) times daily with a meal.  . metoprolol succinate (TOPROL XL) 50 MG 24 hr tablet Take 1 tablet (50 mg total) by mouth daily. Take with or immediately following a meal. (Patient taking differently: Take 50 mg by mouth daily with supper. Take with or immediately following a meal.)  . montelukast (SINGULAIR) 10 MG tablet TAKE 1 TABLET BY MOUTH AT BEDTIME (Patient taking differently: Take 10 mg by mouth at bedtime.)  . omeprazole (PRILOSEC) 40 MG capsule Take 1 capsule by mouth once daily (Patient taking differently: Take 40 mg by mouth at bedtime.)  . oxybutynin (DITROPAN-XL) 10 MG 24 hr tablet Take 1 tablet (10 mg total) by mouth daily.  . tamsulosin (FLOMAX) 0.4 MG CAPS capsule Take 1 capsule (0.4 mg total) by mouth daily.  . TRULICITY 0.75 MG/0.5ML SOPN INJECT 0.75 MG SUBCUTANEOUSLY ONCE A WEEK (Patient taking differently: Inject 0.75 mg as directed every Thursday.)  . valsartan (DIOVAN) 80 MG tablet Take 1 tablet (80 mg total) by mouth daily. (Patient taking differently: Take 80 mg by mouth every evening.)     Allergies:   Patient has no known allergies.   Social History   Socioeconomic History  . Marital status: Single    Spouse name: Not on file  . Number of children: 1   . Years of education: Not on file  . Highest education level: Not on file  Occupational History  . Occupation: maintance   Tobacco Use  . Smoking status: Former Smoker    Types: Cigarettes  . Smokeless tobacco: Never Used  . Tobacco comment: quit 10 years ago  Vaping Use  . Vaping Use: Never used  Substance and Sexual Activity  . Alcohol use: No  . Drug use: No  . Sexual activity: Yes  Other Topics Concern  . Not on file  Social History Narrative  . Not on file   Social Determinants of Health   Financial Resource Strain: Not on file  Food Insecurity: Not on file  Transportation Needs: Not on file  Physical Activity: Not on file  Stress: Not on file  Social Connections: Not on file     Family History: The patient's family history includes Hypertension in  his mother. There is no history of Colon cancer, Colon polyps, Stomach cancer, Rectal cancer, Esophageal cancer, Inflammatory bowel disease, Liver disease, or Pancreatic cancer.  ROS:   Please see the history of present illness.     All other systems reviewed and are negative.  EKGs/Labs/Other Studies Reviewed:    The following studies were reviewed today:  Echo 07/03/2020 1. Left ventricular ejection fraction, by estimation, is 60 to 65%. The  left ventricle has normal function. The left ventricle has no regional  wall motion abnormalities. There is mild concentric left ventricular  hypertrophy. Left ventricular diastolic  parameters are consistent with Grade II diastolic dysfunction  (pseudonormalization).  2. Right ventricular systolic function is normal. The right ventricular  size is normal. There is normal pulmonary artery systolic pressure.  3. Left atrial size was moderately dilated.  4. The mitral valve is normal in structure. No evidence of mitral valve  regurgitation. No evidence of mitral stenosis.  5. The aortic valve is tricuspid. Aortic valve regurgitation is not  visualized. No aortic stenosis  is present.  6. Aortic dilatation noted. There is mild dilatation of the aortic root,  measuring 39 mm.  7. The inferior vena cava is normal in size with greater than 50%  respiratory variability, suggesting right atrial pressure of 3 mmHg.   Myoview 08/16/2020  Nuclear stress EF: 51%.  There was no ST segment deviation noted during stress.  Findings consistent with ischemia and prior myocardial infarction.  This is an intermediate risk study.  The left ventricular ejection fraction is mildly decreased (45-54%).   Defect 1: There is a medium fixed defect of mild severity present in the inferior wall from base to apex, suggests prior infarct. Hypokinesis of the inferior wall.   Defect 2: There is a medium, partially reversible defect of moderate severity present in the basal anteroseptal, mid anteroseptal and apical septal location, suggests prior infarct with periinfarct ischemia.   This is an intermediate risk study.    EKG:  EKG is ordered today.  The ekg ordered today demonstrates normal sinus rhythm, T wave inversion in the lateral leads.  Recent Labs: 03/13/2020: TSH 1.270 06/11/2020: B Natriuretic Peptide 8.4; Hemoglobin 15.1; Hemoglobin 14.3; Platelets 247; Platelets 245 06/26/2020: ALT 71 07/03/2020: BUN 15; Creatinine, Ser 0.89; Potassium 4.4; Sodium 139  Recent Lipid Panel    Component Value Date/Time   CHOL 177 03/13/2020 1044   TRIG 130 03/13/2020 1044   HDL 37 (L) 03/13/2020 1044   CHOLHDL 4.8 03/13/2020 1044   LDLCALC 117 (H) 03/13/2020 1044     Risk Assessment/Calculations:       Physical Exam:    VS:  BP 128/82   Pulse 82   Ht 6\' 4"  (1.93 m)   Wt (!) 342 lb 3.2 oz (155.2 kg)   SpO2 97%   BMI 41.65 kg/m     Wt Readings from Last 3 Encounters:  08/27/20 (!) 342 lb 3.2 oz (155.2 kg)  08/15/20 (!) 343 lb (155.6 kg)  07/17/20 (!) 343 lb 12.8 oz (155.9 kg)     GEN:  Well nourished, well developed in no acute distress HEENT: Normal NECK: No  JVD; No carotid bruits LYMPHATICS: No lymphadenopathy CARDIAC: RRR, no murmurs, rubs, gallops RESPIRATORY:  Clear to auscultation without rales, wheezing or rhonchi  ABDOMEN: Soft, non-tender, non-distended MUSCULOSKELETAL:  No edema; No deformity  SKIN: Warm and dry NEUROLOGIC:  Alert and oriented x 3 PSYCHIATRIC:  Normal affect   ASSESSMENT:    1.  Abnormal stress test   2. Pre-procedural examination   3. Essential hypertension   4. Hyperlipidemia LDL goal <100   5. Controlled type 2 diabetes mellitus without complication, without long-term current use of insulin (HCC)    PLAN:    In order of problems listed above:  1. Abnormal stress test: We have reviewed the recent Myoview result.  He continued to have the intermittent chest discomfort.  We discussed cardiac catheterization as recommended by Dr. Swaziland, he is agreeable to proceed.  2. Hypertension: Blood pressure stable  3. Hyperlipidemia: On Lipitor.  4. DM2: Managed by primary care provider   Shared Decision Making/Informed Consent The risks [stroke (1 in 1000), death (1 in 1000), kidney failure [usually temporary] (1 in 500), bleeding (1 in 200), allergic reaction [possibly serious] (1 in 200)], benefits (diagnostic support and management of coronary artery disease) and alternatives of a cardiac catheterization were discussed in detail with Mr. Gondek and he is willing to proceed.        Medication Adjustments/Labs and Tests Ordered: Current medicines are reviewed at length with the patient today.  Concerns regarding medicines are outlined above.  Orders Placed This Encounter  Procedures  . Basic metabolic panel  . CBC  . EKG 12-Lead   No orders of the defined types were placed in this encounter.   Patient Instructions  Medication Instructions:  Your physician recommends that you continue on your current medications as directed. Please refer to the Current Medication list given to you today.  *If you need  a refill on your cardiac medications before your next appointment, please call your pharmacy*  Lab Work: Your physician recommends that you return for lab work on Friday 08/31/20:   BMET  CBC  You will need a COVID-19  test prior to your procedure. You are scheduled for Friday 08/31/20 at 8:35 AM. This is a Drive Up Visit at [address] [place], [place] [address]. Someone will direct you to the appropriate testing line. Stay in your car and someone will be with you shortly.  If you have labs (blood work) drawn today and your tests are completely normal, you will receive your results only by: Marland Kitchen MyChart Message (if you have MyChart) OR . A paper copy in the mail If you have any lab test that is abnormal or we need to change your treatment, we will call you to review the results.  Testing/Procedures: Your physician has requested that you have a cardiac catheterization. Cardiac catheterization is used to diagnose and/or treat various heart conditions. Doctors may recommend this procedure for a number of different reasons. The most common reason is to evaluate chest pain. Chest pain can be a symptom of coronary artery disease (CAD), and cardiac catheterization can show whether plaque is narrowing or blocking your heart's arteries. This procedure is also used to evaluate the valves, as well as measure the blood flow and oxygen levels in different parts of your heart. For further information please visit [email or web address] Please follow instruction sheet, as given.    Follow-Up: At [facility], you and your health needs are our priority.  As part of our continuing mission to provide you with exceptional heart care, we have created designated Provider Care Teams.  These Care Teams include your primary Cardiologist (physician) and Advanced Practice Providers (APPs -  Physician Assistants and Nurse Practitioners) who all work together to provide you with the care you need, when you need  it.  Your next appointment:   3-4  week(s)  The format for your next appointment:   In Person  Provider:   Peter Jordan, MD or Vicki Chaffin, PA-C   Other Instructions       [facility] [address] [place] [address] Dept: [phone] Loc: [phone]  Mustafa E Happel  08/27/2020  You are scheduled for a Cardiac Catheterization on Tuesday, May 17 with Dr. Peter Jordan.  1. Please arrive at the [facility] (Main Entrance A) at [facility]: [address] , [place] [address] at 7:00 AM (This time is two hours before your procedure to ensure your preparation). Free valet parking service is available.   Special note: Every effort is made to have your procedure done on time. Please understand that emergencies sometimes delay scheduled procedures.  2. Diet: Do not eat solid foods after midnight.  The patient may have clear liquids until 5am upon the day of the procedure.  3. Labs: You will need to have blood drawn on Friday, May 13 at [facility] [address],   Open: 8 AM - 4:30 PM (Lunch 12:30 - 1:30)   Phone: [phone]. You do not need to be fasting.  4. Medication instructions in preparation for your procedure:   Contrast Allergy: No   Stop taking, Diovan (Valsartan) Monday, May 16,, HTCZ (Hydrochlorothiazide) Monday, May 16,  Do not take Diabetes Med Glucophage (Metformin) on the day of the procedure and HOLD 48 HOURS AFTER THE PROCEDURE.  On the morning of your procedure, take your Aspirin and any morning medicines NOT listed above.  You may use sips of water.  5. Plan for one night stay--bring personal belongings. 6. Bring a current list of your medications and current insurance cards. 7. You MUST have a responsible person to drive you home. 8. Someone MUST be with you the first 24 hours after you arrive home or your discharge will be  delayed. 9. Please wear clothes that are easy to get on and off and wear slip-on shoes.  Thank you for allowing us to care for you!   -- [facility]      Signed, Ariba Lehnen, PA  08/29/2020 11:09 PM    [facility] 

## 2020-08-27 NOTE — Patient Instructions (Addendum)
Medication Instructions:  Your physician recommends that you continue on your current medications as directed. Please refer to the Current Medication list given to you today.  *If you need a refill on your cardiac medications before your next appointment, please call your pharmacy*  Lab Work: Your physician recommends that you return for lab work on Friday 08/31/20:   BMET  CBC  You will need a COVID-19  test prior to your procedure. You are scheduled for Friday 08/31/20 at 8:35 AM. This is a Drive Up Visit at [address] [place], [place] [address]. Someone will direct you to the appropriate testing line. Stay in your car and someone will be with you shortly.  If you have labs (blood work) drawn today and your tests are completely normal, you will receive your results only by: Marland Kitchen MyChart Message (if you have MyChart) OR . A paper copy in the mail If you have any lab test that is abnormal or we need to change your treatment, we will call you to review the results.  Testing/Procedures: Your physician has requested that you have a cardiac catheterization. Cardiac catheterization is used to diagnose and/or treat various heart conditions. Doctors may recommend this procedure for a number of different reasons. The most common reason is to evaluate chest pain. Chest pain can be a symptom of coronary artery disease (CAD), and cardiac catheterization can show whether plaque is narrowing or blocking your heart's arteries. This procedure is also used to evaluate the valves, as well as measure the blood flow and oxygen levels in different parts of your heart. For further information please visit [email or web address] Please follow instruction sheet, as given.    Follow-Up: At [facility], you and your health needs are our priority.  As part of our continuing mission to provide you with exceptional heart care, we have created designated Provider Care Teams.  These Care Teams include your primary  Cardiologist (physician) and Advanced Practice Providers (APPs -  Physician Assistants and Nurse Practitioners) who all work together to provide you with the care you need, when you need it.  Your next appointment:   3-4 week(s)  The format for your next appointment:   In Person  Provider:   Peter Swaziland, MD or Scott Course, PA-C   Other Instructions       [facility] CARDIOVASCULAR DIVISION [facility] [address] [place] [address] [place] [address] Dept: [phone] Loc: [phone]  Scott Buchanan  08/27/2020  You are scheduled for a Cardiac Catheterization on Tuesday, May 17 with Dr. Peter Buchanan.  1. Please arrive at the [facility] (Main Entrance A) at [facility]: [address] [place], [place] [address] at 7:00 AM (This time is two hours before your procedure to ensure your preparation). Free valet parking service is available.   Special note: Every effort is made to have your procedure done on time. Please understand that emergencies sometimes delay scheduled procedures.  2. Diet: Do not eat solid foods after midnight.  The patient may have clear liquids until 5am upon the day of the procedure.  3. Labs: You will need to have blood drawn on Friday, May 13 at [facility] [address], [place]  Open: 8 AM - 4:30 PM (Lunch 12:30 - 1:30)   Phone: [phone]. You do not need to be fasting.  4. Medication instructions in preparation for your procedure:   Contrast Allergy: No   Stop taking, Diovan (Valsartan) Monday, May  16,, HTCZ (Hydrochlorothiazide) Monday, May 16,  Do not take Diabetes Med Glucophage (Metformin) on the day of the procedure and HOLD 48 HOURS AFTER THE PROCEDURE.  On the morning of your procedure, take your Aspirin and any morning medicines NOT listed above.  You may use sips of water.  5. Plan for one night stay--bring personal belongings. 6. Bring a current list of your medications  and current insurance cards. 7. You MUST have a responsible person to drive you home. 8. Someone MUST be with you the first 24 hours after you arrive home or your discharge will be delayed. 9. Please wear clothes that are easy to get on and off and wear slip-on shoes.  Thank you for allowing [place] to care for you!   -- Scott Buchanan [facility] services

## 2020-08-29 ENCOUNTER — Other Ambulatory Visit: Payer: Self-pay | Admitting: Physician Assistant

## 2020-08-29 ENCOUNTER — Encounter: Payer: Self-pay | Admitting: Physician Assistant

## 2020-08-29 ENCOUNTER — Other Ambulatory Visit: Payer: Self-pay | Admitting: Family Medicine

## 2020-08-29 DIAGNOSIS — J3089 Other allergic rhinitis: Secondary | ICD-10-CM

## 2020-08-29 DIAGNOSIS — I10 Essential (primary) hypertension: Secondary | ICD-10-CM

## 2020-08-29 DIAGNOSIS — Z9109 Other allergy status, other than to drugs and biological substances: Secondary | ICD-10-CM

## 2020-08-29 MED ORDER — SODIUM CHLORIDE 0.9% FLUSH: 3.0000 mL | Freq: Two times a day (BID) | INTRAVENOUS | Status: DC

## 2020-08-31 ENCOUNTER — Other Ambulatory Visit (HOSPITAL_COMMUNITY)
Admission: RE | Admit: 2020-08-31 | Discharge: 2020-08-31 | Disposition: A | Discharge status: Home / Self Care | Payer: PRIVATE HEALTH INSURANCE | Source: Ambulatory Visit | Attending: Cardiology | Admitting: Cardiology

## 2020-08-31 ENCOUNTER — Ambulatory Visit: Payer: PRIVATE HEALTH INSURANCE | Admitting: Physician Assistant

## 2020-08-31 DIAGNOSIS — Z01812 Encounter for preprocedural laboratory examination: Secondary | ICD-10-CM | POA: Diagnosis present

## 2020-08-31 DIAGNOSIS — Z20822 Contact with and (suspected) exposure to covid-19: Secondary | ICD-10-CM | POA: Insufficient documentation

## 2020-08-31 LAB — CBC
Hematocrit: 42.4 % (ref 37.5–51.0)
Hemoglobin: 14.4 g/dL (ref 13.0–17.7)
MCH: 28.9 pg (ref 26.6–33.0)
MCHC: 34 g/dL (ref 31.5–35.7)
MCV: 85 fL (ref 79–97)
Platelets: 280 10*3/uL (ref 150–450)
RBC: 4.98 x10E6/uL (ref 4.14–5.80)
RDW: 13.7 % (ref 11.6–15.4)
WBC: 5.8 10*3/uL (ref 3.4–10.8)

## 2020-08-31 LAB — BASIC METABOLIC PANEL
BUN/Creatinine Ratio: 14 (ref 9–20)
BUN: 13 mg/dL (ref 6–24)
CO2: 21 mmol/L (ref 20–29)
Calcium: 10 mg/dL (ref 8.7–10.2)
Chloride: 104 mmol/L (ref 96–106)
Creatinine, Ser: 0.9 mg/dL (ref 0.76–1.27)
Glucose: 233 mg/dL — ABNORMAL HIGH (ref 65–99)
Potassium: 4.5 mmol/L (ref 3.5–5.2)
Sodium: 140 mmol/L (ref 134–144)
eGFR: 102 mL/min/{1.73_m2} (ref >59)

## 2020-09-01 LAB — SARS CORONAVIRUS 2 (TAT 6-24 HRS): SARS Coronavirus 2: NEGATIVE

## 2020-09-03 ENCOUNTER — Telehealth: Payer: Self-pay | Admitting: *Deleted

## 2020-09-03 NOTE — Telephone Encounter (Signed)
Pt contacted pre-catheterization scheduled at [facility] for: Tuesday Sep 04, 2020 9:30 AM Verified arrival time and place: [facility] Main Entrance A [facility] at: 7:30 AM   No solid food after midnight prior to cath, clear liquids until 5 AM day of procedure.  Hold: Metformin-day of procedure and 48 hours post procedure HCTZ-AM of procedure  Except hold medications AM meds can be  taken pre-cath with sips of water including: ASA 81 mg   Confirmed patient has responsible adult to drive home post procedure and be with patient first 24 hours after arriving home: yes  You are allowed ONE visitor in the waiting room during the time you are at the hospital for your procedure. Both you and your visitor must wear a mask once you enter the hospital.   Reviewed procedure/mask/visitor instructions with patient.

## 2020-09-04 ENCOUNTER — Encounter (HOSPITAL_COMMUNITY)
Admission: RE | Disposition: A | Discharge status: Home / Self Care | Payer: Self-pay | Source: Home / Self Care | Attending: Cardiology

## 2020-09-04 ENCOUNTER — Ambulatory Visit (HOSPITAL_COMMUNITY)
Admission: RE | Admit: 2020-09-04 | Discharge: 2020-09-04 | Disposition: A | Discharge status: Home / Self Care | Payer: PRIVATE HEALTH INSURANCE | Attending: Cardiology | Admitting: Cardiology

## 2020-09-04 DIAGNOSIS — R079 Chest pain, unspecified: Secondary | ICD-10-CM | POA: Insufficient documentation

## 2020-09-04 DIAGNOSIS — Z87891 Personal history of nicotine dependence: Secondary | ICD-10-CM | POA: Diagnosis not present

## 2020-09-04 DIAGNOSIS — E119 Type 2 diabetes mellitus without complications: Secondary | ICD-10-CM | POA: Diagnosis not present

## 2020-09-04 DIAGNOSIS — Z79899 Other long term (current) drug therapy: Secondary | ICD-10-CM | POA: Insufficient documentation

## 2020-09-04 DIAGNOSIS — E785 Hyperlipidemia, unspecified: Secondary | ICD-10-CM | POA: Insufficient documentation

## 2020-09-04 DIAGNOSIS — Z7982 Long term (current) use of aspirin: Secondary | ICD-10-CM | POA: Insufficient documentation

## 2020-09-04 DIAGNOSIS — Z794 Long term (current) use of insulin: Secondary | ICD-10-CM | POA: Insufficient documentation

## 2020-09-04 DIAGNOSIS — Z7984 Long term (current) use of oral hypoglycemic drugs: Secondary | ICD-10-CM | POA: Diagnosis not present

## 2020-09-04 DIAGNOSIS — I209 Angina pectoris, unspecified: Secondary | ICD-10-CM | POA: Diagnosis present

## 2020-09-04 DIAGNOSIS — I10 Essential (primary) hypertension: Secondary | ICD-10-CM | POA: Diagnosis not present

## 2020-09-04 DIAGNOSIS — E1165 Type 2 diabetes mellitus with hyperglycemia: Secondary | ICD-10-CM

## 2020-09-04 DIAGNOSIS — E1169 Type 2 diabetes mellitus with other specified complication: Secondary | ICD-10-CM | POA: Diagnosis present

## 2020-09-04 HISTORY — PX: LEFT HEART CATH AND CORONARY ANGIOGRAPHY: CATH118249

## 2020-09-04 LAB — GLUCOSE, CAPILLARY
Glucose-Capillary: 153 mg/dL — ABNORMAL HIGH (ref 70–99)
Glucose-Capillary: 117 mg/dL — ABNORMAL HIGH (ref 70–99)

## 2020-09-04 SURGERY — LEFT HEART CATH AND CORONARY ANGIOGRAPHY
Anesthesia: LOCAL

## 2020-09-04 MED ORDER — SODIUM CHLORIDE 0.9 % IV SOLN: INTRAVENOUS | Status: DC

## 2020-09-04 MED ORDER — ASPIRIN 81 MG PO CHEW: 81.0000 mg | CHEWABLE_TABLET | ORAL | Status: DC

## 2020-09-04 MED ORDER — VERAPAMIL HCL 2.5 MG/ML IV SOLN
INTRAVENOUS | Status: DC | PRN
  Administered 2020-09-04: 10 mL via INTRA_ARTERIAL

## 2020-09-04 MED ORDER — HEPARIN SODIUM (PORCINE) 1000 UNIT/ML IJ SOLN
INTRAMUSCULAR | Status: DC | PRN
  Administered 2020-09-04: 6000 [IU] via INTRAVENOUS

## 2020-09-04 MED ORDER — HEPARIN SODIUM (PORCINE) 1000 UNIT/ML IJ SOLN
INTRAMUSCULAR | Status: AC
  Filled 2020-09-04: qty 1

## 2020-09-04 MED ORDER — SODIUM CHLORIDE 0.9% FLUSH: 3.0000 mL | INTRAVENOUS | Status: DC | PRN

## 2020-09-04 MED ORDER — ACETAMINOPHEN 325 MG PO TABS: 650.0000 mg | ORAL_TABLET | ORAL | Status: DC | PRN

## 2020-09-04 MED ORDER — FENTANYL CITRATE (PF) 100 MCG/2ML IJ SOLN
INTRAMUSCULAR | Status: DC | PRN
  Administered 2020-09-04: 25 ug via INTRAVENOUS

## 2020-09-04 MED ORDER — METFORMIN HCL 1000 MG PO TABS: 1000.0000 mg | ORAL_TABLET | Freq: Two times a day (BID) | ORAL | 1 refills | Status: DC

## 2020-09-04 MED ORDER — LIDOCAINE HCL (PF) 1 % IJ SOLN
INTRAMUSCULAR | Status: DC | PRN
  Administered 2020-09-04: 2 mL

## 2020-09-04 MED ORDER — SODIUM CHLORIDE 0.9 % IV SOLN: 250.0000 mL | INTRAVENOUS | Status: DC | PRN

## 2020-09-04 MED ORDER — HEPARIN (PORCINE) IN NACL 2000-0.9 UNIT/L-% IV SOLN: INTRAVENOUS | Status: DC | PRN

## 2020-09-04 MED ORDER — IOHEXOL 350 MG/ML SOLN
INTRAVENOUS | Status: DC | PRN
  Administered 2020-09-04: 69 mL

## 2020-09-04 MED ORDER — MIDAZOLAM HCL 2 MG/2ML IJ SOLN
INTRAMUSCULAR | Status: DC | PRN
  Administered 2020-09-04: 1 mg via INTRAVENOUS

## 2020-09-04 MED ORDER — ONDANSETRON HCL 4 MG/2ML IJ SOLN: 4.0000 mg | Freq: Four times a day (QID) | INTRAMUSCULAR | Status: DC | PRN

## 2020-09-04 MED ORDER — FENTANYL CITRATE (PF) 100 MCG/2ML IJ SOLN
INTRAMUSCULAR | Status: AC
  Filled 2020-09-04: qty 2

## 2020-09-04 MED ORDER — MIDAZOLAM HCL 2 MG/2ML IJ SOLN
INTRAMUSCULAR | Status: AC
  Filled 2020-09-04: qty 2

## 2020-09-04 MED ORDER — LIDOCAINE HCL (PF) 1 % IJ SOLN
INTRAMUSCULAR | Status: AC
  Filled 2020-09-04: qty 30

## 2020-09-04 MED ORDER — HEPARIN (PORCINE) IN NACL 1000-0.9 UT/500ML-% IV SOLN
INTRAVENOUS | Status: DC | PRN
  Administered 2020-09-04 (×2): 500 mL

## 2020-09-04 MED ORDER — SODIUM CHLORIDE 0.9 % WEIGHT BASED INFUSION: 1.0000 mL/kg/h | INTRAVENOUS | Status: DC

## 2020-09-04 MED ORDER — SODIUM CHLORIDE 0.9% FLUSH: 3.0000 mL | Freq: Two times a day (BID) | INTRAVENOUS | Status: DC

## 2020-09-04 MED ORDER — SODIUM CHLORIDE 0.9 % WEIGHT BASED INFUSION
3.0000 mL/kg/h | INTRAVENOUS | Status: AC
  Administered 2020-09-04: 3 mL/kg/h via INTRAVENOUS

## 2020-09-04 MED ORDER — VERAPAMIL HCL 2.5 MG/ML IV SOLN
INTRAVENOUS | Status: AC
  Filled 2020-09-04: qty 2

## 2020-09-04 MED ORDER — HEPARIN (PORCINE) IN NACL 1000-0.9 UT/500ML-% IV SOLN
INTRAVENOUS | Status: AC
  Filled 2020-09-04: qty 1000

## 2020-09-04 SURGICAL SUPPLY — 12 items
CATH 5FR JL3.5 JR4 ANG PIG MP (CATHETERS) ×2 IMPLANT
CATH LAUNCHER 5F RADR (CATHETERS) ×1 IMPLANT
CATHETER LAUNCHER 5F RADR (CATHETERS) ×2
DEVICE RAD COMP TR BAND LRG (VASCULAR PRODUCTS) ×2 IMPLANT
GLIDESHEATH SLEND SS 6F .021 (SHEATH) ×2 IMPLANT
GUIDEWIRE INQWIRE 1.5J.035X260 (WIRE) ×1 IMPLANT
INQWIRE 1.5J .035X260CM (WIRE) ×2
KIT HEART LEFT (KITS) ×2 IMPLANT
PACK CARDIAC CATHETERIZATION (CUSTOM PROCEDURE TRAY) ×2 IMPLANT
TRANSDUCER W/STOPCOCK (MISCELLANEOUS) ×2 IMPLANT
TUBING CIL FLEX 10 FLL-RA (TUBING) ×2 IMPLANT
WIRE HI TORQ VERSACORE-J 145CM (WIRE) ×2 IMPLANT

## 2020-09-04 NOTE — Interval H&P Note (Signed)
History and Physical Interval Note:  09/04/2020 11:00 AM  Scott Buchanan  has presented today for surgery, with the diagnosis of chest pain - abnormal stress test.  The various methods of treatment have been discussed with the patient and family. After consideration of risks, benefits and other options for treatment, the patient has consented to  Procedure(s): LEFT HEART CATH AND CORONARY ANGIOGRAPHY (N/A) as a surgical intervention.  The patient's history has been reviewed, patient examined, no change in status, stable for surgery.  I have reviewed the patient's chart and labs.  Questions were answered to the patient's satisfaction.   Cath Lab Visit (complete for each Cath Lab visit)  Clinical Evaluation Leading to the Procedure:   ACS: No.  Non-ACS:    Anginal Classification: CCS III  Anti-ischemic medical therapy: Maximal Therapy (2 or more classes of medications)  Non-Invasive Test Results: Intermediate-risk stress test findings: cardiac mortality 1-3%/year  Prior CABG: No previous CABG        Scott Buchanan [facility] 09/04/2020 11:00 AM

## 2020-09-04 NOTE — Research (Signed)
IDENTIFY Informed Consent   Subject Name: Scott Buchanan  Subject met inclusion and exclusion criteria.  The informed consent form, study requirements and expectations were reviewed with the subject and questions and concerns were addressed prior to the signing of the consent form.  The subject verbalized understanding of the trail requirements.  The subject agreed to participate in the IDENTIFY trial and signed the informed consent.  The informed consent was obtained prior to performance of any protocol-specific procedures for the subject.  A copy of the signed informed consent was given to the subject and a copy was placed in the subject's medical record.  [name]. 09/04/2020, 08:10 am

## 2020-09-04 NOTE — Discharge Instructions (Signed)
Drink plenty of fluids for 48 hours and keep wrist elevated at heart level for 24 hours  Radial Site Care   This sheet gives you information about how to care for yourself after your procedure. Your health care provider may also give you more specific instructions. If you have problems or questions, contact your health care provider. What can I expect after the procedure? After the procedure, it is common to have:  Bruising and tenderness at the catheter insertion area. Follow these instructions at home: Medicines  Take over-the-counter and prescription medicines only as told by your health care provider. Insertion site care 1. Follow instructions from your health care provider about how to take care of your insertion site. Make sure you: ? Wash your hands with soap and water before you change your bandage (dressing). If soap and water are not available, use hand sanitizer. ? Remove your dressing as told by your health care provider. In 24 hours 2. Check your insertion site every day for signs of infection. Check for: ? Redness, swelling, or pain. ? Fluid or blood. ? Pus or a bad smell. ? Warmth. 3. Do not take baths, swim, or use a hot tub until your health care provider approves. 4. You may shower 24-48 hours after the procedure, or as directed by your health care provider. ? Remove the dressing and gently wash the site with plain soap and water. ? Pat the area dry with a clean towel. ? Do not rub the site. That could cause bleeding. 5. Do not apply powder or lotion to the site. Activity   1. For 24 hours after the procedure, or as directed by your health care provider: ? Do not flex or bend the affected arm. ? Do not push or pull heavy objects with the affected arm. ? Do not drive yourself home from the hospital or clinic. You may drive 24 hours after the procedure unless your health care provider tells you not to. ? Do not operate machinery or power tools. 2. Do not lift  anything that is heavier than 10 lb (4.5 kg), or the limit that you are told, until your health care provider says that it is safe.  For 4 days 3. Ask your health care provider when it is okay to: ? Return to work or school. ? Resume usual physical activities or sports. ? Resume sexual activity. General instructions  If the catheter site starts to bleed, raise your arm and put firm pressure on the site. If the bleeding does not stop, get help right away. This is a medical emergency.  If you went home on the same day as your procedure, a responsible adult should be with you for the first 24 hours after you arrive home.  Keep all follow-up visits as told by your health care provider. This is important. Contact a health care provider if:  You have a fever.  You have redness, swelling, or yellow drainage around your insertion site. Get help right away if:  You have unusual pain at the radial site.  The catheter insertion area swells very fast.  The insertion area is bleeding, and the bleeding does not stop when you hold steady pressure on the area.  Your arm or hand becomes pale, cool, tingly, or numb. These symptoms may represent a serious problem that is an emergency. Do not wait to see if the symptoms will go away. Get medical help right away. Call your local emergency services (911 in the [place] Do   not drive yourself to the hospital. Summary  After the procedure, it is common to have bruising and tenderness at the site.  Follow instructions from your health care provider about how to take care of your radial site wound. Check the wound every day for signs of infection.  Do not lift anything that is heavier than 10 lb (4.5 kg), or the limit that you are told, until your health care provider says that it is safe. This information is not intended to replace advice given to you by your health care provider. Make sure you discuss any questions you have with your health care  provider. Document Revised: 05/13/2017 Document Reviewed: 05/13/2017 Elsevier Patient Education  2020 Elsevier Inc.  

## 2020-09-05 ENCOUNTER — Other Ambulatory Visit: Payer: Self-pay | Admitting: Family Medicine

## 2020-09-05 ENCOUNTER — Encounter (HOSPITAL_COMMUNITY): Payer: Self-pay | Admitting: Cardiology

## 2020-09-05 DIAGNOSIS — Z9109 Other allergy status, other than to drugs and biological substances: Secondary | ICD-10-CM

## 2020-09-11 ENCOUNTER — Ambulatory Visit: Payer: PRIVATE HEALTH INSURANCE | Admitting: Physician Assistant

## 2020-09-11 ENCOUNTER — Other Ambulatory Visit: Payer: Self-pay

## 2020-09-11 ENCOUNTER — Encounter: Payer: Self-pay | Admitting: Physician Assistant

## 2020-09-11 VITALS — BP 110/72 | HR 91 | Ht 76.0 in | Wt 340.0 lb

## 2020-09-11 DIAGNOSIS — N401 Enlarged prostate with lower urinary tract symptoms: Secondary | ICD-10-CM | POA: Diagnosis not present

## 2020-09-11 DIAGNOSIS — N3943 Post-void dribbling: Secondary | ICD-10-CM

## 2020-09-11 LAB — BLADDER SCAN AMB NON-IMAGING

## 2020-09-11 MED ORDER — OXYBUTYNIN CHLORIDE ER 10 MG PO TB24: 10.0000 mg | ORAL_TABLET | Freq: Every day | ORAL | 11 refills | Status: DC

## 2020-09-11 NOTE — Progress Notes (Signed)
09/11/2020 9:53 AM   Scott Buchanan [birth year] [id number]  CC: Chief Complaint  Patient presents with  . Benign Prostatic Hypertrophy    HPI: Scott Buchanan is a 55 y.o. male with PMH BPH with nocturia and postvoid dribbling on Flomax 0.4 mg daily who presents today for symptom recheck and PVR on oxybutynin XL 10 mg.  He previously took [place], but stopped this because his insurance would not cover it.  Today he reports symptomatic improvement on oxybutynin.  He denies frequency and reports infrequent urge incontinence and persistent but improved urgency.  Overall, he states Gemtesa helped his urinary symptoms more, but oxybutynin has improved him somewhat.  He notes some intermittent dry mouth on oxybutynin, but denies constipation or dry eye.  Notably, he intends to change his insurance coverage in July 2022.  PVR 15 mL  PMH: Past Medical History:  Diagnosis Date  . Arthritis   . Asthma   . Diabetes mellitus without complication (HCC)    type 2  . Hypertension   . Primary localized osteoarthritis of right knee 03/02/2018    Surgical History: Past Surgical History:  Procedure Laterality Date  . CYSTECTOMY     tailbone  . ETHMOIDECTOMY Bilateral 04/07/2017   Procedure: BILATERAL ETHMOIDECTOMY AND SPHENOIDECTOMY;  Surgeon: Newman Pies, MD;  Location: [facility];  Service: ENT;  Laterality: Bilateral;  . KNEE ARTHROSCOPY W/ MENISCAL REPAIR     right and left knee one year apart  . LEFT HEART CATH AND CORONARY ANGIOGRAPHY N/A 09/04/2020   Procedure: LEFT HEART CATH AND CORONARY ANGIOGRAPHY;  Surgeon: Swaziland, Peter M, MD;  [facility]: [facility] INV[facility]IVE CV LAB;  Service: Cardiovascular;  Laterality: N/A;  . PARTIAL KNEE ARTHROPLASTY Right 03/02/2018   Procedure: UNICOMPARTMENTAL KNEE;  Surgeon: Teryl Lucy, MD;  Location: [facility];  Service: Orthopedics;  Laterality: Right;  with block  . SINUS ENDO WITH FUSION Bilateral 04/07/2017   Procedure: BILATERAL FRONTAL  RECESS SINUS EXPLORATION WITH SINUS FUSION NAVIGATION;  Surgeon: Newman Pies, MD;  Location: [facility];  Service: ENT;  Laterality: Bilateral;  . SINUS EXPLORATION    . TONSILLECTOMY    . TURBINATE REDUCTION Bilateral 04/07/2017   Procedure: BILATERAL TURBINATE REDUCTION;  Surgeon: Newman Pies, MD;  Location: [facility];  Service: ENT;  Laterality: Bilateral;  . unicompartmental right knee      Home Medications:  Allergies as of 09/11/2020   No Known Allergies     Medication List       Accurate as of Sep 11, 2020  9:53 AM. If you have any questions, ask your nurse or doctor.        acetaminophen 650 MG CR tablet Commonly known as: TYLENOL Take 1,300 mg by mouth every 8 (eight) hours as needed for pain.   albuterol 108 (90 Base) MCG/ACT inhaler Commonly known as: VENTOLIN HFA Inhale 2 puffs into the lungs every 4 (four) hours as needed for wheezing or shortness of breath.   amLODipine 10 MG tablet Commonly known as: NORVASC Take 1 tablet by mouth once daily   aspirin EC 81 MG tablet Take 81 mg by mouth daily.   atorvastatin 40 MG tablet Commonly known as: LIPITOR Take 0.5 tablets (20 mg total) by mouth daily.   cholecalciferol 1000 units tablet Commonly known as: VITAMIN D Take 1,000 Units by mouth daily.   dicyclomine 10 MG capsule Commonly known as: BENTYL Take 1 capsule (10 mg total) by mouth 4 (four) times daily -  before meals and at bedtime. What changed:   when to take this  reasons to take this   Fiber 625 MG Tabs Take 625 mg by mouth daily as needed (indigestion / constipation).   fluticasone 50 MCG/ACT nasal spray Commonly known as: FLONASE Place 2 sprays into both nostrils at bedtime.   gabapentin 300 MG capsule Commonly known as: NEURONTIN Take 300 mg by mouth at bedtime.   hydrochlorothiazide 12.5 MG tablet Commonly known as: HYDRODIURIL Take 1 tablet (12.5 mg total) by mouth daily.   levocetirizine 5 MG  tablet Commonly known as: XYZAL TAKE 1 TABLET BY MOUTH ONCE DAILY IN THE EVENING   metFORMIN 1000 MG tablet Commonly known as: GLUCOPHAGE Take 1 tablet (1,000 mg total) by mouth 2 (two) times daily with a meal.   metoprolol succinate 50 MG 24 hr tablet Commonly known as: Toprol XL Take 1 tablet (50 mg total) by mouth daily. Take with or immediately following a meal. What changed: when to take this   montelukast 10 MG tablet Commonly known as: SINGULAIR TAKE 1 TABLET BY MOUTH AT BEDTIME   omeprazole 40 MG capsule Commonly known as: PRILOSEC Take 1 capsule by mouth once daily What changed: when to take this   oxybutynin 10 MG 24 hr tablet Commonly known as: DITROPAN-XL Take 1 tablet (10 mg total) by mouth daily.   PROBIOTIC PO Take 1 capsule by mouth daily.   tamsulosin 0.4 MG Caps capsule Commonly known as: FLOMAX Take 1 capsule (0.4 mg total) by mouth daily.   Trulicity 0.75 MG/0.5ML Sopn Generic drug: Dulaglutide INJECT 0.75 MG SUBCUTANEOUSLY ONCE A WEEK What changed: See the new instructions.   valsartan 80 MG tablet Commonly known as: Diovan Take 1 tablet (80 mg total) by mouth daily. What changed: when to take this   vitamin C 1000 MG tablet Take 1,000 mg by mouth daily.       Allergies:  No Known Allergies  Family History: Family History  Problem Relation Age of Onset  . Hypertension Mother   . Colon cancer Neg Hx   . Colon polyps Neg Hx   . Stomach cancer Neg Hx   . Rectal cancer Neg Hx   . Esophageal cancer Neg Hx   . Inflammatory bowel disease Neg Hx   . Liver disease Neg Hx   . Pancreatic cancer Neg Hx     Social History:   reports that he has quit smoking. His smoking use included cigarettes. He has never used smokeless tobacco. He reports that he does not drink alcohol and does not use drugs.  Physical Exam: BP 110/72   Pulse 91   Ht 6\' 4"  (1.93 m)   Wt (!) 340 lb (154.2 kg)   BMI 41.39 kg/m   Constitutional:  Alert and oriented,  no acute distress, nontoxic appearing HEENT: [place], AT Cardiovascular: No clubbing, cyanosis, or edema Respiratory: Normal respiratory effort, no increased work of breathing Skin: No rashes, bruises or suspicious lesions Neurologic: Grossly intact, no focal deficits, moving all 4 extremities Psychiatric: Normal mood and affect  Laboratory Data: Results for orders placed or performed in visit on 09/11/20  Bladder Scan (Post Void Residual) in office  Result Value Ref Range   Scan Result 69mL    Assessment & Plan:   1. Benign prostatic hyperplasia with post-void dribbling Symptoms improved on Flomax and oxybutynin combo therapy, however not as well-controlled as on Gemtesa.  Counseled patient to continue oxybutynin, refill sent today.  Once his insurance  coverage changes, may consider seeing if his new plan will cover Gemtesa, at which point we can switch him back.  Patient is in agreement with this plan - Bladder Scan (Post Void Residual) in office - oxybutynin (DITROPAN-XL) 10 MG 24 hr tablet; Take 1 tablet (10 mg total) by mouth daily.  Dispense: 30 tablet; Refill: 11   Return if symptoms worsen or fail to improve.  Carman Ching, PA-C  [facility] [address] [place], [place] [address] [phone]

## 2020-09-19 ENCOUNTER — Other Ambulatory Visit: Payer: Self-pay | Admitting: Family Medicine

## 2020-09-19 ENCOUNTER — Other Ambulatory Visit: Payer: Self-pay | Admitting: Urology

## 2020-09-19 DIAGNOSIS — E669 Obesity, unspecified: Secondary | ICD-10-CM

## 2020-09-19 DIAGNOSIS — N3943 Post-void dribbling: Secondary | ICD-10-CM

## 2020-09-19 DIAGNOSIS — E1169 Type 2 diabetes mellitus with other specified complication: Secondary | ICD-10-CM

## 2020-10-02 ENCOUNTER — Other Ambulatory Visit: Payer: Self-pay | Admitting: Cardiology

## 2020-10-08 ENCOUNTER — Ambulatory Visit: Payer: PRIVATE HEALTH INSURANCE | Admitting: Physician Assistant

## 2020-10-08 ENCOUNTER — Other Ambulatory Visit: Payer: Self-pay

## 2020-10-08 VITALS — BP 136/80 | HR 76 | Ht 75.0 in | Wt 341.0 lb

## 2020-10-08 DIAGNOSIS — E119 Type 2 diabetes mellitus without complications: Secondary | ICD-10-CM

## 2020-10-08 DIAGNOSIS — R0789 Other chest pain: Secondary | ICD-10-CM | POA: Diagnosis not present

## 2020-10-08 DIAGNOSIS — I10 Essential (primary) hypertension: Secondary | ICD-10-CM

## 2020-10-08 DIAGNOSIS — E785 Hyperlipidemia, unspecified: Secondary | ICD-10-CM | POA: Diagnosis not present

## 2020-10-08 NOTE — Progress Notes (Signed)
Cardiology Office Note:    Date:  10/10/2020   ID:  Scott Buchanan, DOB May 13, 1965, MRN [id number]  PCP:  Massie Maroon, FNP   [facility] Providers Cardiologist:  Peter Swaziland, MD     Referring MD: Massie Maroon, FNP   Chief Complaint  Patient presents with   Follow-up    3-4 weeks.   Headache   Shortness of Breath   Chest Pain   Edema    History of Present Illness:    Scott Buchanan is a 55 y.o. male with a hx of HTN, HLD, DM 2 and history of asthma.  Patient presented to the ED in February 2020 with complaint of fatigue.  Blood pressure was uncontrolled, cardiology service was consulted.  He was placed on Toprol 50 mg daily. On follow-up, he was seen by [name] on 06/20/2020, blood pressure remained poorly controlled.  He was also having atypical chest pain at the time, a coronary CT was recommended once his blood pressure is under better control.  Valsartan 40 mg daily was added to his medical regimen.  Echocardiogram obtained on 07/03/2020 showed EF 60 to 65%, no significant valve disease, mild dilatation of the aortic root measuring 39 mm. He has been seen by his urologist on 3/23, blood pressure at the time was 135/85.  I last saw the patient on 07/17/2020, at which time I recommended sleep study and Myoview for his atypical chest pain.  I will increase his valsartan to 80 mg daily.  Myoview came back abnormal with EF 51%, medium fixed defect of mild severity present in the inferior wall from base to apex suggestive of prior infarct, medium partially reversible defect of moderate severity present in the basal anteroseptal, mild anteroseptal and apical septal location consistent with previous infarct with peri-infarct ischemia, overall intermediate risk study.  The study was reviewed by Dr. Swaziland who recommended cardiac catheterization.  Patient eventually underwent cardiac catheterization that showed normal coronary arteries on 09/04/2020.   Patient presents today for  follow-up.  He still has intermittent chest pain radiating down the arms.  He has pulses equal on both side.  Suspicion for aortic dissection very low in this case.  I reviewed the recent cardiac catheterization results with Scott Buchanan, at this time I do not recommend any further cardiac work-up.  He will see his PCP tomorrow, she may consider a chest CT to rule out pulmonary issue.  Given the reassuring cardiac catheterization, patient may follow-up with cardiology service in 1 year.  Her recent chest pain is unlikely to be cardiac.  Past Medical History:  Diagnosis Date   Arthritis    Asthma    Diabetes mellitus without complication (HCC)    type 2   Hypertension    Primary localized osteoarthritis of right knee 03/02/2018    Past Surgical History:  Procedure Laterality Date   CYSTECTOMY     tailbone   ETHMOIDECTOMY Bilateral 04/07/2017   Procedure: BILATERAL ETHMOIDECTOMY AND SPHENOIDECTOMY;  Surgeon: Newman Pies, MD;  Location: [facility];  Service: ENT;  Laterality: Bilateral;   KNEE ARTHROSCOPY W/ MENISCAL REPAIR     right and left knee one year apart   LEFT HEART CATH AND CORONARY ANGIOGRAPHY N/A 09/04/2020   Procedure: LEFT HEART CATH AND CORONARY ANGIOGRAPHY;  Surgeon: Swaziland, Peter M, MD;  Location: [facility] INVASIVE CV LAB;  Service: Cardiovascular;  Laterality: N/A;   PARTIAL KNEE ARTHROPLASTY Right 03/02/2018   Procedure: UNICOMPARTMENTAL KNEE;  Surgeon: Dion Saucier,  Ivin Booty, MD;  Location: [facility];  Service: Orthopedics;  Laterality: Right;  with block   SINUS ENDO WITH FUSION Bilateral 04/07/2017   Procedure: BILATERAL FRONTAL RECESS SINUS EXPLORATION WITH SINUS FUSION NAVIGATION;  Surgeon: Newman Pies, MD;  Location: [facility];  Service: ENT;  Laterality: Bilateral;   SINUS EXPLORATION     TONSILLECTOMY     TURBINATE REDUCTION Bilateral 04/07/2017   Procedure: BILATERAL TURBINATE REDUCTION;  Surgeon: Newman Pies, MD;  Location: [facility];  Service:  ENT;  Laterality: Bilateral;   unicompartmental right knee      Current Medications: Current Meds  Medication Sig   acetaminophen (TYLENOL) 650 MG CR tablet Take 1,300 mg by mouth every 8 (eight) hours as needed for pain.   albuterol (VENTOLIN HFA) 108 (90 Base) MCG/ACT inhaler Inhale 2 puffs into the lungs every 4 (four) hours as needed for wheezing or shortness of breath.   amLODipine (NORVASC) 10 MG tablet Take 1 tablet by mouth once daily   Ascorbic Acid (VITAMIN C) 1000 MG tablet Take 1,000 mg by mouth daily.   aspirin EC 81 MG tablet Take 81 mg by mouth daily.   Calcium Polycarbophil (FIBER) 625 MG TABS Take 625 mg by mouth daily as needed (indigestion / constipation).   cholecalciferol (VITAMIN D) 1000 units tablet Take 1,000 Units by mouth daily.   dicyclomine (BENTYL) 10 MG capsule Take 1 capsule (10 mg total) by mouth 4 (four) times daily -  before meals and at bedtime. (Patient taking differently: Take 10 mg by mouth 3 (three) times daily as needed for spasms.)   fluticasone (FLONASE) 50 MCG/ACT nasal spray Place 2 sprays into both nostrils at bedtime.   gabapentin (NEURONTIN) 300 MG capsule Take 1 capsule by mouth at bedtime   hydrochlorothiazide (HYDRODIURIL) 12.5 MG tablet Take 1 tablet (12.5 mg total) by mouth daily.   metFORMIN (GLUCOPHAGE) 1000 MG tablet Take 1 tablet (1,000 mg total) by mouth 2 (two) times daily with a meal.   metoprolol succinate (TOPROL-XL) 50 MG 24 hr tablet TAKE 1 TABLET BY MOUTH ONCE DAILY(TAKE WITH OR IMMEDIATELY FOLLOWING A MEAL)   montelukast (SINGULAIR) 10 MG tablet TAKE 1 TABLET BY MOUTH AT BEDTIME   omeprazole (PRILOSEC) 40 MG capsule Take 1 capsule by mouth once daily (Patient taking differently: Take 40 mg by mouth at bedtime.)   oxybutynin (DITROPAN-XL) 10 MG 24 hr tablet Take 1 tablet (10 mg total) by mouth daily.   Probiotic Product (PROBIOTIC PO) Take 1 capsule by mouth daily.   tamsulosin (FLOMAX) 0.4 MG CAPS capsule Take 1 capsule (0.4 mg  total) by mouth daily.   TRULICITY 0.75 MG/0.5ML SOPN INJECT 0.75 MG SUBCUTANEOUSLY  ONCE A WEEK   valsartan (DIOVAN) 80 MG tablet Take 1 tablet (80 mg total) by mouth daily. (Patient taking differently: Take 80 mg by mouth every evening.)   [DISCONTINUED] atorvastatin (LIPITOR) 40 MG tablet Take 0.5 tablets (20 mg total) by mouth daily.   [DISCONTINUED] levocetirizine (XYZAL) 5 MG tablet TAKE 1 TABLET BY MOUTH ONCE DAILY IN THE EVENING   Current Facility-Administered Medications for the 10/08/20 encounter (Office Visit) with Azalee Course, PA  Medication   sodium chloride flush (NS) 0.9 % injection 3 mL     Allergies:   Patient has no known allergies.   Social History   Socioeconomic History   Marital status: Single    Spouse name: Not on file   Number of children: 1   Years of education:  Not on file   Highest education level: Not on file  Occupational History   Occupation: maintance   Tobacco Use   Smoking status: Former    Pack years: 0.00    Types: Cigarettes   Smokeless tobacco: Never   Tobacco comments:    quit 10 years ago  Vaping Use   Vaping Use: Never used  Substance and Sexual Activity   Alcohol use: No   Drug use: No   Sexual activity: Yes  Other Topics Concern   Not on file  Social History Narrative   Not on file   Social Determinants of Health   Financial Resource Strain: Not on file  Food Insecurity: Not on file  Transportation Needs: Not on file  Physical Activity: Not on file  Stress: Not on file  Social Connections: Not on file     Family History: The patient's family history includes Hypertension in his mother. There is no history of Colon cancer, Colon polyps, Stomach cancer, Rectal cancer, Esophageal cancer, Inflammatory bowel disease, Liver disease, or Pancreatic cancer.  ROS:   Please see the history of present illness.     All other systems reviewed and are negative.  EKGs/Labs/Other Studies Reviewed:    The following studies were  reviewed today:  Cath 09/04/2020 LV end diastolic pressure is mildly elevated.   1. Normal coronary anatomy 2. Mildly elevated LVEDP   Plan: consider other causes of chest pain. Risk factor modification.  EKG:  EKG is not ordered today.   Recent Labs: 03/13/2020: TSH 1.270 06/11/2020: B Natriuretic Peptide 8.4 06/26/2020: ALT 71 08/31/2020: BUN 13; Creatinine, Ser 0.90; Hemoglobin 14.4; Platelets 280; Potassium 4.5; Sodium 140  Recent Lipid Panel    Component Value Date/Time   CHOL 185 10/09/2020 0948   TRIG 188 (H) 10/09/2020 0948   HDL 33 (L) 10/09/2020 0948   CHOLHDL 5.6 (H) 10/09/2020 0948   LDLCALC 119 (H) 10/09/2020 0948     Risk Assessment/Calculations:           Physical Exam:    VS:  BP 136/80 (BP Location: Left Arm, Patient Position: Sitting, Cuff Size: Large)   Pulse 76   Ht 6\' 3"  (1.905 m)   Wt (!) 341 lb (154.7 kg)   BMI 42.62 kg/m     Wt Readings from Last 3 Encounters:  10/09/20 (!) 343 lb 0.4 oz (155.6 kg)  10/08/20 (!) 341 lb (154.7 kg)  09/11/20 (!) 340 lb (154.2 kg)     GEN:  Well nourished, well developed in no acute distress HEENT: Normal NECK: No JVD; No carotid bruits LYMPHATICS: No lymphadenopathy CARDIAC: RRR, no murmurs, rubs, gallops RESPIRATORY:  Clear to auscultation without rales, wheezing or rhonchi  ABDOMEN: Soft, non-tender, non-distended MUSCULOSKELETAL:  No edema; No deformity  SKIN: Warm and dry NEUROLOGIC:  Alert and oriented x 3 PSYCHIATRIC:  Normal affect   ASSESSMENT:    1. Atypical chest pain   2. Essential hypertension   3. Hyperlipidemia LDL goal <70   4. Controlled type 2 diabetes mellitus without complication, without long-term current use of insulin (HCC)    PLAN:    In order of problems listed above:  Atypical chest pain: Recent cardiac catheterization showed normal coronary arteries.  Chest pain is unlikely to be cardiac in nature.  Hypertension: Blood pressure stable  Hyperlipidemia: On  Lipitor.  DM2: Managed by primary care provider        Medication Adjustments/Labs and Tests Ordered: Current medicines are reviewed at length with  the patient today.  Concerns regarding medicines are outlined above.  No orders of the defined types were placed in this encounter.  No orders of the defined types were placed in this encounter.   Patient Instructions  Medication Instructions:  Your physician recommends that you continue on your current medications as directed. Please refer to the Current Medication list given to you today.  *If you need a refill on your cardiac medications before your next appointment, please call your pharmacy*   Lab Work: NONE ordered at this time of appointment   If you have labs (blood work) drawn today and your tests are completely normal, you will receive your results only by: MyChart Message (if you have MyChart) OR A paper copy in the mail If you have any lab test that is abnormal or we need to change your treatment, we will call you to review the results.   Testing/Procedures: NONE ordered at this time of appointment     Follow-Up: At [facility], you and your health needs are our priority.  As part of our continuing mission to provide you with exceptional heart care, we have created designated Provider Care Teams.  These Care Teams include your primary Cardiologist (physician) and Advanced Practice Providers (APPs -  Physician Assistants and Nurse Practitioners) who all work together to provide you with the care you need, when you need it.  We recommend signing up for the patient portal called "MyChart".  Sign up information is provided on this After Visit Summary.  MyChart is used to connect with patients for Virtual Visits (Telemedicine).  Patients are able to view lab/test results, encounter notes, upcoming appointments, etc.  Non-urgent messages can be sent to your provider as well.   To learn more about what you can do with  MyChart, go to [email or web address].    Your next appointment:   12 month(s)  The format for your next appointment:   In Person  Provider:   Peter SwazilandJordan, MD  Other Instructions    Signed, Azalee CourseHao Neha Waight, PA  10/10/2020 10:00 PM    [facility]

## 2020-10-08 NOTE — Patient Instructions (Signed)
Medication Instructions:  Your physician recommends that you continue on your current medications as directed. Please refer to the Current Medication list given to you today.  *If you need a refill on your cardiac medications before your next appointment, please call your pharmacy*  Lab Work: NONE ordered at this time of appointment   If you have labs (blood work) drawn today and your tests are completely normal, you will receive your results only by: MyChart Message (if you have MyChart) OR A paper copy in the mail If you have any lab test that is abnormal or we need to change your treatment, we will call you to review the results.  Testing/Procedures: NONE ordered at this time of appointment   Follow-Up: At [facility], you and your health needs are our priority.  As part of our continuing mission to provide you with exceptional heart care, we have created designated Provider Care Teams.  These Care Teams include your primary Cardiologist (physician) and Advanced Practice Providers (APPs -  Physician Assistants and Nurse Practitioners) who all work together to provide you with the care you need, when you need it.  We recommend signing up for the patient portal called "MyChart".  Sign up information is provided on this After Visit Summary.  MyChart is used to connect with patients for Virtual Visits (Telemedicine).  Patients are able to view lab/test results, encounter notes, upcoming appointments, etc.  Non-urgent messages can be sent to your provider as well.   To learn more about what you can do with MyChart, go to [email or web address].    Your next appointment:   12 month(s)  The format for your next appointment:   In Person  Provider:   Peter Jordan, MD  Other Instructions 

## 2020-10-09 ENCOUNTER — Ambulatory Visit: Payer: PRIVATE HEALTH INSURANCE | Admitting: Family Medicine

## 2020-10-09 ENCOUNTER — Encounter: Payer: Self-pay | Admitting: Family Medicine

## 2020-10-09 VITALS — BP 132/88 | HR 71 | Temp 97.7°F | Ht 76.0 in | Wt 343.0 lb

## 2020-10-09 DIAGNOSIS — E669 Obesity, unspecified: Secondary | ICD-10-CM | POA: Diagnosis not present

## 2020-10-09 DIAGNOSIS — R400 Somnolence: Secondary | ICD-10-CM | POA: Diagnosis not present

## 2020-10-09 DIAGNOSIS — R82998 Other abnormal findings in urine: Secondary | ICD-10-CM | POA: Diagnosis not present

## 2020-10-09 DIAGNOSIS — R0683 Snoring: Secondary | ICD-10-CM

## 2020-10-09 DIAGNOSIS — R0602 Shortness of breath: Secondary | ICD-10-CM

## 2020-10-09 DIAGNOSIS — I10 Essential (primary) hypertension: Secondary | ICD-10-CM

## 2020-10-09 DIAGNOSIS — E1169 Type 2 diabetes mellitus with other specified complication: Secondary | ICD-10-CM

## 2020-10-09 DIAGNOSIS — E785 Hyperlipidemia, unspecified: Secondary | ICD-10-CM

## 2020-10-09 DIAGNOSIS — Z9109 Other allergy status, other than to drugs and biological substances: Secondary | ICD-10-CM

## 2020-10-09 DIAGNOSIS — J4540 Moderate persistent asthma, uncomplicated: Secondary | ICD-10-CM

## 2020-10-09 LAB — POCT GLYCOSYLATED HEMOGLOBIN (HGB A1C)
HbA1c POC (<> result, manual entry): 7.2 % (ref 4.0–5.6)
HbA1c, POC (controlled diabetic range): 7.2 % — AB (ref 0.0–7.0)
HbA1c, POC (prediabetic range): 7.2 % — AB (ref 5.7–6.4)
Hemoglobin A1C: 7.2 % — AB (ref 4.0–5.6)

## 2020-10-09 LAB — POCT URINALYSIS DIPSTICK
Bilirubin, UA: NEGATIVE
Blood, UA: NEGATIVE
Glucose, UA: NEGATIVE
Ketones, UA: NEGATIVE
Nitrite, UA: NEGATIVE
Protein, UA: NEGATIVE
Spec Grav, UA: 1.01 (ref 1.010–1.025)
Urobilinogen, UA: 0.2 E.U./dL
pH, UA: 5.5 (ref 5.0–8.0)

## 2020-10-09 LAB — GLUCOSE, POCT (MANUAL RESULT ENTRY): POC Glucose: 147 mg/dl — AB (ref 70–99)

## 2020-10-09 MED ORDER — TRELEGY ELLIPTA 100-62.5-25 MCG/INH IN AEPB: 1.0000 | INHALATION_SPRAY | Freq: Every day | RESPIRATORY_TRACT | 11 refills | Status: DC

## 2020-10-09 MED ORDER — LEVOCETIRIZINE DIHYDROCHLORIDE 5 MG PO TABS: 5.0000 mg | ORAL_TABLET | Freq: Every evening | ORAL | 1 refills | Status: DC

## 2020-10-09 NOTE — Patient Instructions (Signed)
Fluticasone; Umeclidinium; Vilanterol inhalation powder What is this medication? FLUTICASONE; UMECLIDINIUM; VILANTEROL (floo TIK a sone; ue MEK li DIN ee um; vye LAN ter ol) inhalation is a combination of 3 drugs to treat COPD and asthma. Umeclidinium and Vilanterol are bronchodilators that help keep airways open. Fluticasone decreases inflammation in the lungs. Do not use this drug combination for acute asthma attacks or bronchospasm. This medicine may be used for other purposes; ask your health care provider or pharmacist if you have questions. COMMON BRAND NAME(S): TRELEGY ELLIPTA What should I tell my care team before I take this medication? They need to know if you have any of these conditions: bone problems diabetes eye disease, vision problems heart disease high blood pressure history of irregular heartbeat immune system problems infection kidney disease pheochromocytoma prostate disease seizures thyroid disease trouble passing urine an unusual or allergic reaction to fluticasone, umeclidinium, vilanterol, lactose, milk proteins, other medicines, foods, dyes, or preservatives pregnant or trying to get pregnant breast-feeding How should I use this medication? This drug is inhaled through the mouth. Rinse your mouth with water after use. Make sure not to swallow the water. Take it as directed on the prescription label at the same time every day. Do not use it more often than directed. A special MedGuide will be given to you by the pharmacist with each prescription and refill. Be sure to read this information carefully each time. Talk to your pediatrician about the use of this drug in children. Special care may be needed. Overdosage: If you think you have taken too much of this medicine contact a poison control center or emergency room at once. NOTE: This medicine is only for you. Do not share this medicine with others. What if I miss a dose? If you miss a dose, take it as soon as  you can. If it is almost time for your next dose, take only that dose. Do not take double or extra doses. What may interact with this medication? Do not take this medicine with any of the following medications: cisapride dofetilide dronedarone MAOIs like Carbex, Eldepryl, Marplan, Nardil, and Parnate pimozide thioridazine ziprasidone This medicine may also interact with the following medications: aclidinium antihistamines for allergy antiviral medicines for HIV or AIDS atropine beta-blockers like metoprolol and propranolol certain antibiotics like clarithromycin and telithromycin certain medicines for bladder problems like oxybutynin, tolterodine certain medicines for depression, anxiety, or psychotic disturbances certain medicines for fungal infections like ketoconazole, itraconazole, posaconazole, voriconazole certain medicines for Parkinson's disease like benztropine, trihexyphenidyl certain medicines for stomach problems like dicyclomine, hyoscyamine certain medicines for travel sickness like scopolamine conivaptan diuretics ipratropium medicines for colds other medicines for breathing problems other medicines that prolong the QT interval (cause an abnormal heart rhythm) nefazodone tiotropium This list may not describe all possible interactions. Give your health care provider a list of all the medicines, herbs, non-prescription drugs, or dietary supplements you use. Also tell them if you smoke, drink alcohol, or use illegal drugs. Some items may interact with your medicine. What should I watch for while using this medication? Visit your doctor or health care professional for regular checkups. Tell your doctor or health care professional if your symptoms do not get better. Do not use this medicine more than once every 24 hours. NEVER use this medicine for an acute asthma or COPD attack. You should use your short-acting rescue inhalers for this purpose. If your symptoms get worse  or if you need your short-acting inhalers more often, call your doctor   right away. If you are going to have surgery tell your doctor or health care professional that you are using this medicine. Try not to come in contact with people with the chicken pox or measles. If you do, call your doctor. This medicine may increase blood sugar. Ask your healthcare provider if changes in diet or medicines are needed if you have diabetes. What side effects may I notice from receiving this medication? Side effects that you should report to your doctor or health care professional as soon as possible: allergic reactions like skin rash or hives, swelling of the face, lips, or tongue breathing problems right after inhaling your medicine chest pain eye pain fast, irregular heartbeat feeling faint or lightheaded, falls fever or chills nausea, vomiting signs and symptoms of high blood sugar such as being more thirsty or hungry or having to urinate more than normal. You may also feel very tired or have blurry vision. trouble passing urine Side effects that usually do not require medical attention (report these to your doctor or health care professional if they continue or are bothersome): back pain changes in taste cough diarrhea headache nervousness sore throat tremor This list may not describe all possible side effects. Call your doctor for medical advice about side effects. You may report side effects to FDA at 1-800-FDA-1088. Where should I keep my medication? Keep out of the reach of children and pets. Store at room temperature between 20 and 25 degrees C (68 and 77 degrees F). Keep inhaler away from extreme heat, cold or humidity. Throw away 6 weeks after removing it from the foil pouch, when the dose counter reads "0" or after the expiration date, whichever is first. NOTE: This sheet is a summary. It may not cover all possible information. If you have questions about this medicine, talk to your doctor,  pharmacist, or health care provider.  2022 Elsevier/Gold Standard (2019-02-14 12:45:04)  

## 2020-10-09 NOTE — Progress Notes (Signed)
Patient [facility]  Established Patient Office Visit  Subjective:  Patient ID: Scott Buchanan, male    DOB: [birth year]  Age: 55 y.o. MRN: [id number]  CC:  Chief Complaint  Patient presents with   Follow-up    3 month follow up; chest discomfort seen by Cardiologist was told his heart is fine it may be something with his lungs    HPI Scott Buchanan and is a very pleasant 55 year old male with a medical history significant for type 2 diabetes mellitus moderate persistent asthma, hyperlipidemia, hypertension, environmental allergies, and morbid obesity presents for a 43-monthfollow-up of chronic conditions.  Patient is also evaluated for chest discomfort, he was evaluated by cardiologist previously.  Also patient was evaluated in the emergency department.  While in the emergency department, MI was ruled out.  Also, patient had no acute cardiopulmonary process.  However, he is continue to have periodic chest pains.  Patient is not having chest discomfort at this time.  He does endorse orthopnea.  Patient has to sleep on 3 pillows otherwise shortness of breath will increase.  Also patient feels as though he is wheezing whenever he lies flat.  Also, he feels very uncomfortable with lying down.  Patient feels that he often "mouth breathes".  He endorses some daytime sleepiness.  He does not have a history of obstructive sleep apnea.  Patient does have a history of moderate persistent asthma.  He says that he has been utilizing albuterol inhaler more than he typically does.  He has been mostly using it for wheezing, and shortness of breath.  He denies a persistent cough.  He endorses some environmental allergies.  He typically takes Xyzal for allergies. Patient has a history of type 2 diabetes mellitus.  Diabetes has been fairly controlled over the past several months.  Most recent hemoglobin A1c was 8.1.  At that time, patient's metformin was increased to 1000 mg  twice daily.  Patient states that he has not been following a low-carb, low-fat diet divided over small meals throughout the day.  Also, patient has not been exercising, he works moderately physical job.  He denies any polydipsia, polyuria, polyphagia, or dizziness.  No blurred vision.  Patient is up-to-date with routine eye exam. Still has a history of essential hypertension.  Blood pressure has been controlled.  He does not check blood pressure at home.  He denies any lower extremity swelling or heart palpitations.  Patient has a family history of heart disease.  Past Medical History:  Diagnosis Date   Arthritis    Asthma    Diabetes mellitus without complication ([place]    type 2   Hypertension    Primary localized osteoarthritis of right knee 03/02/2018    Past Surgical History:  Procedure Laterality Date   CYSTECTOMY     tailbone   ETHMOIDECTOMY Bilateral 04/07/2017   Procedure: BILATERAL ETHMOIDECTOMY AND SPHENOIDECTOMY;  Surgeon: TLeta Baptist MD;  [facility]  Service: ENT;  Laterality: Bilateral;   KNEE ARTHROSCOPY W/ MENISCAL REPAIR     right and left knee one year apart   LEFT HEART CATH AND CORONARY ANGIOGRAPHY N/A 09/04/2020   Procedure: LEFT HEART CATH AND CORONARY ANGIOGRAPHY;  Surgeon: JMartinique Peter M, MD;  [facility];  Service: Cardiovascular;  Laterality: N/A;   PARTIAL KNEE ARTHROPLASTY Right 03/02/2018   Procedure: UNICOMPARTMENTAL KNEE;  Surgeon: LMarchia Bond MD;  Location: [facility];  Service: Orthopedics;  Laterality: Right;  with block   SINUS ENDO WITH FUSION Bilateral 04/07/2017   Procedure: BILATERAL FRONTAL RECESS SINUS EXPLORATION WITH SINUS FUSION NAVIGATION;  Surgeon: Leta Baptist, MD;  Location: [facility];  Service: ENT;  Laterality: Bilateral;   SINUS EXPLORATION     TONSILLECTOMY     TURBINATE REDUCTION Bilateral 04/07/2017   Procedure: BILATERAL TURBINATE REDUCTION;  Surgeon: Leta Baptist, MD;  Location:  [facility];  Service: ENT;  Laterality: Bilateral;   unicompartmental right knee      Family History  Problem Relation Age of Onset   Hypertension Mother    Colon cancer Neg Hx    Colon polyps Neg Hx    Stomach cancer Neg Hx    Rectal cancer Neg Hx    Esophageal cancer Neg Hx    Inflammatory bowel disease Neg Hx    Liver disease Neg Hx    Pancreatic cancer Neg Hx     Social History   Socioeconomic History   Marital status: Single    Spouse name: Not on file   Number of children: 1   Years of education: Not on file   Highest education level: Not on file  Occupational History   Occupation: maintance   Tobacco Use   Smoking status: Former    Pack years: 0.00    Types: Cigarettes   Smokeless tobacco: Never   Tobacco comments:    quit 10 years ago  Vaping Use   Vaping Use: Never used  Substance and Sexual Activity   Alcohol use: No   Drug use: No   Sexual activity: Yes  Other Topics Concern   Not on file  Social History Narrative   Not on file   Social Determinants of Health   Financial Resource Strain: Not on file  Food Insecurity: Not on file  Transportation Needs: Not on file  Physical Activity: Not on file  Stress: Not on file  Social Connections: Not on file  Intimate Partner Violence: Not on file    Outpatient Medications Prior to Visit  Medication Sig Dispense Refill   acetaminophen (TYLENOL) 650 MG CR tablet Take 1,300 mg by mouth every 8 (eight) hours as needed for pain.     albuterol (VENTOLIN HFA) 108 (90 Base) MCG/ACT inhaler Inhale 2 puffs into the lungs every 4 (four) hours as needed for wheezing or shortness of breath. 18 g 3   amLODipine (NORVASC) 10 MG tablet Take 1 tablet by mouth once daily 90 tablet 0   Ascorbic Acid (VITAMIN C) 1000 MG tablet Take 1,000 mg by mouth daily.     aspirin EC 81 MG tablet Take 81 mg by mouth daily.     atorvastatin (LIPITOR) 40 MG tablet Take 0.5 tablets (20 mg total) by mouth daily. 90 tablet 0    Calcium Polycarbophil (FIBER) 625 MG TABS Take 625 mg by mouth daily as needed (indigestion / constipation).     cholecalciferol (VITAMIN D) 1000 units tablet Take 1,000 Units by mouth daily.     dicyclomine (BENTYL) 10 MG capsule Take 1 capsule (10 mg total) by mouth 4 (four) times daily -  before meals and at bedtime. (Patient taking differently: Take 10 mg by mouth 3 (three) times daily as needed for spasms.) 60 capsule 1   fluticasone (FLONASE) 50 MCG/ACT nasal spray Place 2 sprays into both nostrils at bedtime.     gabapentin (NEURONTIN) 300 MG capsule Take 1 capsule by mouth at bedtime 90 capsule 0  hydrochlorothiazide (HYDRODIURIL) 12.5 MG tablet Take 1 tablet (12.5 mg total) by mouth daily. 90 tablet 3   metFORMIN (GLUCOPHAGE) 1000 MG tablet Take 1 tablet (1,000 mg total) by mouth 2 (two) times daily with a meal. 180 tablet 1   metoprolol succinate (TOPROL-XL) 50 MG 24 hr tablet TAKE 1 TABLET BY MOUTH ONCE DAILY(TAKE WITH OR IMMEDIATELY FOLLOWING A MEAL) 90 tablet 3   montelukast (SINGULAIR) 10 MG tablet TAKE 1 TABLET BY MOUTH AT BEDTIME 90 tablet 0   omeprazole (PRILOSEC) 40 MG capsule Take 1 capsule by mouth once daily (Patient taking differently: Take 40 mg by mouth at bedtime.) 30 capsule 11   oxybutynin (DITROPAN-XL) 10 MG 24 hr tablet Take 1 tablet (10 mg total) by mouth daily. 30 tablet 11   Probiotic Product (PROBIOTIC PO) Take 1 capsule by mouth daily.     tamsulosin (FLOMAX) 0.4 MG CAPS capsule Take 1 capsule (0.4 mg total) by mouth daily. 90 capsule 3   TRULICITY 0.34 VQ/2.5ZD SOPN INJECT 0.75 MG SUBCUTANEOUSLY  ONCE A WEEK 4 mL 0   valsartan (DIOVAN) 80 MG tablet Take 1 tablet (80 mg total) by mouth daily. (Patient taking differently: Take 80 mg by mouth every evening.) 90 tablet 3   levocetirizine (XYZAL) 5 MG tablet TAKE 1 TABLET BY MOUTH ONCE DAILY IN THE EVENING 90 tablet 0   Facility-Administered Medications Prior to Visit  Medication Dose Route Frequency Provider Last  Rate Last Admin   sodium chloride flush (NS) 0.9 % injection 3 mL  3 mL Intravenous Q12H Meng, [place], PA        No Known Allergies  ROS Review of Systems  Constitutional:  Positive for fatigue.  HENT:  Positive for postnasal drip and rhinorrhea.   Respiratory:  Positive for cough, shortness of breath and wheezing.   Cardiovascular:  Positive for chest pain and leg swelling.  Endocrine: Negative.   Genitourinary: Negative.   Musculoskeletal: Negative.   Allergic/Immunologic: Positive for environmental allergies.  Neurological: Negative.   Hematological: Negative.      Objective:    Physical Exam Constitutional:      Appearance: He is obese.  Eyes:     Pupils: Pupils are equal, round, and reactive to light.  Cardiovascular:     Rate and Rhythm: Normal rate and regular rhythm.     Pulses: Normal pulses.  Pulmonary:     Effort: Pulmonary effort is normal.  Musculoskeletal:        General: Normal range of motion.  Skin:    General: Skin is warm.  Neurological:     General: No focal deficit present.     Mental Status: Mental status is at baseline.  Psychiatric:        Mood and Affect: Mood normal.        Behavior: Behavior normal.        Thought Content: Thought content normal.        Judgment: Judgment normal.    BP (!) 151/86 (BP Location: Right Arm, Patient Position: Sitting)   Pulse 71   Temp 97.7 F (36.5 C)   Ht '6\' 4"'  (1.93 m)   Wt (!) 343 lb 0.4 oz (155.6 kg)   SpO2 98%   BMI 41.75 kg/m  Wt Readings from Last 3 Encounters:  10/09/20 (!) 343 lb 0.4 oz (155.6 kg)  10/08/20 (!) 341 lb (154.7 kg)  09/11/20 (!) 340 lb (154.2 kg)     There are no preventive care reminders to display for  this patient.  There are no preventive care reminders to display for this patient.  Lab Results  Component Value Date   TSH 1.270 03/13/2020   Lab Results  Component Value Date   WBC 5.8 08/31/2020   HGB 14.4 08/31/2020   HCT 42.4 08/31/2020   MCV 85 08/31/2020    PLT 280 08/31/2020   Lab Results  Component Value Date   NA 140 08/31/2020   K 4.5 08/31/2020   CO2 21 08/31/2020   GLUCOSE 233 (H) 08/31/2020   BUN 13 08/31/2020   CREATININE 0.90 08/31/2020   BILITOT 0.5 06/26/2020   ALKPHOS 71 06/26/2020   AST 43 (H) 06/26/2020   ALT 71 (H) 06/26/2020   PROT 7.2 06/26/2020   ALBUMIN 4.6 06/26/2020   CALCIUM 10.0 08/31/2020   ANIONGAP 13 06/11/2020   EGFR 102 08/31/2020   Lab Results  Component Value Date   CHOL 177 03/13/2020   Lab Results  Component Value Date   HDL 37 (L) 03/13/2020   Lab Results  Component Value Date   LDLCALC 117 (H) 03/13/2020   Lab Results  Component Value Date   TRIG 130 03/13/2020   Lab Results  Component Value Date   CHOLHDL 4.8 03/13/2020   Lab Results  Component Value Date   HGBA1C 7.2 (A) 10/09/2020   HGBA1C 7.2 10/09/2020   HGBA1C 7.2 (A) 10/09/2020   HGBA1C 7.2 (A) 10/09/2020      Assessment & Plan:   Problem List Items Addressed This Visit       Cardiovascular and Mediastinum   Essential hypertension   Relevant Orders   Urinalysis Dipstick (Completed)     Other   Hyperlipidemia LDL goal <100   Relevant Orders   Lipid Panel   Other Visit Diagnoses     Diabetes mellitus type 2 in obese ([place])    -  Primary   Relevant Orders   Urinalysis Dipstick (Completed)   HgB A1c (Completed)   Glucose (CBG) (Completed)   Urine white blood cells increased       Relevant Orders   Urine Culture   Daytime sleepiness       Relevant Orders   Split night study   Snoring       Relevant Orders   Split night study   SOB (shortness of breath) on exertion       Relevant Orders   Split night study   Moderate persistent asthma without complication       Relevant Medications   Fluticasone-Umeclidin-Vilant (TRELEGY ELLIPTA) 100-62.5-25 MCG/INH AEPB   Other Relevant Orders   Ambulatory referral to Allergy   Allergy to environmental factors       Relevant Medications   levocetirizine (XYZAL) 5  MG tablet       Meds ordered this encounter  Medications   Fluticasone-Umeclidin-Vilant (TRELEGY ELLIPTA) 100-62.5-25 MCG/INH AEPB    Sig: Inhale 1 puff into the lungs daily.    Dispense:  1 each    Refill:  11    Order Specific Question:   Supervising Provider    Answer:   Tresa Garter [[id number]]   levocetirizine (XYZAL) 5 MG tablet    Sig: Take 1 tablet (5 mg total) by mouth every evening.    Dispense:  90 tablet    Refill:  1    Order Specific Question:   Supervising Provider    Answer:   Angelica Chessman E [[id number]]   5. Diabetes mellitus type 2 in obese (HCC) Today,  patient's hemoglobin A1c is 7.2, which is much improved from previous.  No medication changes are warranted today.  Patient advised to follow a carbohydrate modified diet divided over small meals throughout the day.  Also, 30 minutes of low impact cardiovascular exercises recommended about 3 times per week. - Urinalysis Dipstick - HgB A1c - Glucose (CBG)  2. Essential hypertension - Continue medication, monitor blood pressure at home. Continue DASH diet.  Reminder to go to the ER if any CP, SOB, nausea, dizziness, severe HA, changes vision/speech, left arm numbness and tingling and jaw pain.   - Urinalysis Dipstick  3. Urine white blood cells increased Urine leukocytes, will follow urine culture.  No empiric treatment warranted at this time, patient asymptomatic. - Urine Culture  4. Daytime sleepiness Patient has some degree of orthopnea.  He is now sleeping on 2 pillows.  He snores.  And on physical exam noted to have a large neck and has increased work of breathing.  Patient may benefit from a sleep study. - Split night study; Future  5. Snoring  - Split night study; Future  6. SOB (shortness of breath) on exertion  - Split night study; Future  7. Moderate persistent asthma without complication Patient's asthma has been worsening.  He has been using albuterol inhaler more than usual.  He is  not on any maintenance long-acting bronchodilators.  We will start Trelegy Ellipta today.  Patient warrants referral to asthma and allergy services for further work-up and evaluation.  We will continue Singulair and Xyzal at bedtime. - Fluticasone-Umeclidin-Vilant (TRELEGY ELLIPTA) 100-62.5-25 MCG/INH AEPB; Inhale 1 puff into the lungs daily.  Dispense: 1 each; Refill: 11 - Ambulatory referral to Allergy  8. Allergy to environmental factors  - levocetirizine (XYZAL) 5 MG tablet; Take 1 tablet (5 mg total) by mouth every evening.  Dispense: 90 tablet; Refill: 1  9. Hyperlipidemia LDL goal <100 The 10-year ASCVD risk score Mikey Bussing DC Jr., et al., 2013) is: 14.3%   Values used to calculate the score:     Age: 34 years     Sex: Male     Is Non-Hispanic African American: No     Diabetic: Yes     Tobacco smoker: No     Systolic Blood Pressure: 625 mmHg     Is BP treated: Yes     HDL Cholesterol: 33 mg/dL     Total Cholesterol: 185 mg/dL  - Lipid Panel  Follow-up: Return in about 3 months (around 01/09/2021) for hypertension.    Donia Pounds  APRN, MSN, FNP-C [facility] [address] [place], [place] [address] [phone]

## 2020-10-10 ENCOUNTER — Telehealth: Payer: Self-pay | Admitting: Family Medicine

## 2020-10-10 ENCOUNTER — Other Ambulatory Visit: Payer: Self-pay | Admitting: Family Medicine

## 2020-10-10 DIAGNOSIS — E785 Hyperlipidemia, unspecified: Secondary | ICD-10-CM

## 2020-10-10 LAB — LIPID PANEL
Chol/HDL Ratio: 5.6 ratio — ABNORMAL HIGH (ref 0.0–5.0)
Cholesterol, Total: 185 mg/dL (ref 100–199)
HDL: 33 mg/dL — ABNORMAL LOW (ref >39)
LDL Chol Calc (NIH): 119 mg/dL — ABNORMAL HIGH (ref 0–99)
Triglycerides: 188 mg/dL — ABNORMAL HIGH (ref 0–149)
VLDL Cholesterol Cal: 33 mg/dL (ref 5–40)

## 2020-10-10 MED ORDER — ATORVASTATIN CALCIUM 40 MG PO TABS: 40.0000 mg | ORAL_TABLET | Freq: Every day | ORAL | 2 refills | Status: DC

## 2020-10-10 NOTE — Progress Notes (Signed)
Meds ordered this encounter  Medications  . atorvastatin (LIPITOR) 40 MG tablet    Sig: Take 1 tablet (40 mg total) by mouth daily.    Dispense:  90 tablet    Refill:  2    Order Specific Question:   Supervising Provider    Answer:   JEGEDE, OLUGBEMIGA E [[id number]]     Scott Buchanan Nhia Heaphy  APRN, MSN, FNP-C [facility] [address]  [place], [place] [address] [phone]  

## 2020-10-10 NOTE — Telephone Encounter (Signed)
Scott Buchanan is a 55 year old male with a medical history significant for type 2 diabetes mellitus, morbid obesity, moderate intermittent asthma, chronic sinusitis, environmental allergies, and hyperlipidemia presented on 10/09/2020 for follow-up of chronic conditions.  Reviewed patient's cholesterol panel, triglycerides elevated from previous at 188, the goal is less than 150.  We will increase atorvastatin to 40 mg/day.  Also, recommend a low-fat, low carbohydrate diet divided over small meals throughout the day.   Nolon Nations  APRN, MSN, FNP-C [facility] [address] [place], [place] [address] [phone]

## 2020-10-10 NOTE — Telephone Encounter (Signed)
Patient notified of results. Confirmed pharmacy. No additional questions or concerns.

## 2020-10-13 LAB — URINE CULTURE

## 2020-10-15 ENCOUNTER — Other Ambulatory Visit: Payer: Self-pay | Admitting: Family Medicine

## 2020-10-15 DIAGNOSIS — N390 Urinary tract infection, site not specified: Secondary | ICD-10-CM

## 2020-10-15 MED ORDER — SULFAMETHOXAZOLE-TRIMETHOPRIM 800-160 MG PO TABS: 1.0000 | ORAL_TABLET | Freq: Two times a day (BID) | ORAL | 0 refills | Status: AC

## 2020-10-15 NOTE — Progress Notes (Signed)
Reviewed PDMP substance reporting system prior to prescribing opiate medications. No inconsistencies noted.  Meds ordered this encounter  Medications   sulfamethoxazole-trimethoprim (BACTRIM DS) 800-160 MG tablet    Sig: Take 1 tablet by mouth 2 (two) times daily for 5 days.    Dispense:  10 tablet    Refill:  0    Order Specific Question:   Supervising Provider    Answer:   Quentin Angst [[id number]]     Nolon Nations  APRN, MSN, FNP-C Patient Care [facility] [address] [employer], [place] [address] [phone]

## 2020-10-17 ENCOUNTER — Other Ambulatory Visit: Payer: Self-pay | Admitting: Family Medicine

## 2020-10-17 DIAGNOSIS — E669 Obesity, unspecified: Secondary | ICD-10-CM

## 2020-10-19 ENCOUNTER — Ambulatory Visit: Payer: PRIVATE HEALTH INSURANCE | Admitting: Cardiology

## 2020-10-29 NOTE — Progress Notes (Signed)
New Patient Note  RE: [name] MRN: [id number] DOB: 1965-11-28 Date of Office Visit: 10/30/2020  Consult requested by: Massie Maroon, FNP Primary care provider: Massie Maroon, FNP  Chief Complaint: Asthma (Allergy induced asthma possible per pcp)  History of Present Illness: I had the pleasure of seeing Scott Buchanan for initial evaluation at the [facility] on 10/30/2020. He is a 55 y.o. male, who is referred here by Massie Maroon, FNP for the evaluation of asthma.  He reports symptoms of chest tightness, shortness of breath, coughing, wheezing for 5-6 years but worse the past 2 months. Current medications include albuterol prn and Trelegy 1 puff once a day x 2 weeks which help. He reports not using aerochamber with inhalers. He tried the following inhalers: not sure of the names. Main triggers are hot weather, humidity, dust, exertion. In the last month, frequency of symptoms: daily. Frequency of nocturnal symptoms: 0x/month. Frequency of SABA use: 2x/day. Interference with physical activity: sometimes. In the last 12 months, emergency room visits/urgent care visits/doctor office visits or hospitalizations due to respiratory issues: one. In the last 12 months, oral steroids courses: none. Lifetime history of hospitalization for respiratory issues: none. Prior intubations: no. History of pneumonia: no. He was evaluated by allergist in the past. Smoking exposure: quit 10-12 years ago. Up to date with flu vaccine: yes. Up to date with COVID-19 vaccine: yes. Prior Covid-19 infection: no. History of reflux: takes omeprazole daily.  Patient had cardiology work up which was normal - had cath.   He reports symptoms of sneezing, nasal congestion, rhinorrhea, throat clearing, PND, watery/burny eyes. Symptoms have been going on for 20+ years. The symptoms are present all year around with worsening in spring and fall. Anosmia: no. Headache: no. He has used Xyzal,  Singulair, Flonase with fair improvement in symptoms. Sinus infections: 2-3. Previous work up includes: 20 years ago and had multiple positives per patient report. No prior AIT. Previous ENT evaluation: yes and had 2 sinus surgeries - 2 years ago, and in the 90s. History of nasal polyps: yes.  10/09/2020 PCP visit: "Moderate persistent asthma without complication Patient's asthma has been worsening.  He has been using albuterol inhaler more than usual.  He is not on any maintenance long-acting bronchodilators.  We will start Trelegy Ellipta today.  Patient warrants referral to asthma and allergy services for further work-up and evaluation.  We will continue Singulair and Xyzal at bedtime."  Assessment and Plan: Scott Buchanan is a 55 y.o. male with: Asthma, not well controlled Diagnosed with asthma over 5 years ago but worsening symptoms the last 2 months.  Recently started on Trelegy 100 mcg 1 puff once a day with some benefit but still using albuterol daily. Today's spirometry shows some restriction with 10% and greater than 200 cc improvement in FEV1 post bronchodilator treatment.  Clinically feeling improved.  Patient also used his own albuterol inhaler 2 hours before visit. Daily controller medication(s): INCREASE Trelegy to 1 puff once a day and rinse mouth after each use. Sample given and demonstrated proper use.  Continue Singulair (montelukast) 10mg  daily at night. May use albuterol rescue inhaler 2 puffs every 4 to 6 hours as needed for shortness of breath, chest tightness, coughing, and wheezing. May use albuterol rescue inhaler 2 puffs 5 to 15 minutes prior to strenuous physical activities. Monitor frequency of use.  Get spirometry at next visit.  Other allergic rhinitis Perennial rhinoconjunctivitis symptoms for 20+ years  with worsening in the spring and fall.  Tried Xyzal, Singulair and Flonase with good benefit.  No prior AIT. Today's skin testing showed: Positive to grass, ragweed,  weed, mold, dust mites, cockroach. Start environmental control measures as below. Use over the counter antihistamines such as Zyrtec (cetirizine), Claritin (loratadine), Allegra (fexofenadine), or Xyzal (levocetirizine) daily as needed. May take twice a day during allergy flares. May switch antihistamines every few months. Continue Singulair (montelukast) 10mg  daily at night. Use Flonase (fluticasone) nasal spray 1 spray per nostril twice a day as needed for nasal congestion.  Use azelastine nasal spray 1-2 sprays per nostril twice a day as needed for runny nose/drainage. Nasal saline spray (i.e., Simply Saline) or nasal saline lavage (i.e., NeilMed) is recommended as needed and prior to medicated nasal sprays. Consider allergy injections for long term control if above medications do not help the symptoms - handout given. Asthma needs to be under better control before starting.  History of nasal polyp History of nasal polyps with 2 sinus surgeries in the past. See assessment and plan as above for allergic rhinitis.  Monitor symptoms.  Return in about 2 months (around 12/31/2020).  Meds ordered this encounter  Medications   azelastine (ASTELIN) 0.1 % nasal spray    Sig: Place 1-2 sprays into both nostrils 2 (two) times daily as needed (nasal drainage). Use in each nostril as directed    Dispense:  30 mL    Refill:  5   Fluticasone-Umeclidin-Vilant (TRELEGY ELLIPTA) 200-62.5-25 MCG/INH AEPB    Sig: Inhale 1 puff into the lungs daily. Rinse mouth after each use.    Dispense:  60 each    Refill:  5    Lab Orders  No laboratory test(s) ordered today    Other allergy screening: Food allergy: no Medication allergy: no Hymenoptera allergy: no Urticaria: no Eczema:no History of recurrent infections suggestive of immunodeficency: no  Diagnostics: Spirometry:  Tracings reviewed. His effort: Good reproducible efforts. FVC: 4.11L FEV1: 3.46L, 76% predicted FEV1/FVC ratio:  84% Interpretation: Spirometry consistent with possible restrictive disease with 10% and greater than 200cc improvement in FEV1 post bronchodilator treatment. Clinically feeling improved.   Took albuterol 2 puffs around 2 hours before appointment. Please see scanned spirometry results for details.  Skin Testing: Environmental allergy panel and select foods. Positive to grass, ragweed, weed, mold, dust mites, cockroach. Results discussed with patient/family.  Airborne Adult Perc - 10/30/20 1138     Time Antigen Placed 1135    Allergen Manufacturer 12/31/20    Location Back    Number of Test 59    1. Control-Buffer 50% Glycerol Negative    2. Control-Histamine 1 mg/ml 2+    3. Albumin saline Negative    4. Bahia 2+    5. Waynette Buttery Negative    6. Johnson Negative    7. [place] Blue 2+    8. Meadow Fescue 2+    9. Perennial Rye 2+    10. Sweet Vernal 2+    11. Timothy 2+    12. Cocklebur Negative    13. Burweed Marshelder Negative    14. Ragweed, short 2+    15. Ragweed, Giant Negative    16. Plantain,  English Negative    17. Lamb's Quarters Negative    18. Sheep Sorrell Negative    19. Rough Pigweed Negative    20. Marsh Elder, Rough Negative    21. Mugwort, Common Negative    22. Ash mix Negative    23. [place] mix  Negative    24. Beech American Negative    25. Box, Elder Negative    26. Cedar, red Negative    27. Cottonwood, Guinea-Bissau Negative    28. Elm mix Negative    29. Hickory Negative    30. Maple mix Negative    31. Oak, Guinea[place] mix Negative    32. Pecan Pollen Negative    33. Pine mix Negative    34. Sycamore Eastern Negative    35. Walnut, Black Pollen Negative    36. Alternaria alternata Negative    37. Cladosporium Herbarum Negative    38. Aspergillus mix Negative    39. Penicillium mix Negative    40. Bipolaris sorokiniana (Helminthosporium) Negative    41. Drechslera spicifera (Curvularia) Negative    42. Mucor plumbeus Negative    43. Fusarium  moniliforme Negative    44. Aureobasidium pullulans (pullulara) Negative    45. Rhizopus oryzae Negative    46. Botrytis cinera Negative    47. Epicoccum nigrum Negative    48. Phoma betae Negative    49. Candida Albicans Negative    50. Trichophyton mentagrophytes 2+    51. Mite, D Farinae  5,000 AU/ml Negative    52. Mite, D Pteronyssinus  5,000 AU/ml 2+    53. Cat Hair 10,000 BAU/ml Negative    54.  Dog Epithelia Negative    55. Mixed Feathers Negative    56. Horse Epithelia Negative    57. Cockroach, German Negative    58. Mouse Negative    59. Tobacco Leaf Negative             Food Perc - 10/30/20 1138       Test Information   Time Antigen Placed 1135    Allergen Manufacturer Waynette Buttery    Location Back    Number of allergen test 10      Food   1. Peanut Negative    2. Soybean food Negative    3. Wheat, whole Negative    4. Sesame Negative    5. Milk, cow Negative    6. Egg White, chicken Negative    7. Casein Negative    8. Shellfish mix Negative    9. Fish mix Negative    10. Cashew Negative             Intradermal - 10/30/20 1157     Time Antigen Placed 1200    Allergen Manufacturer Waynette Buttery    Location Arm    Number of Test 12    Control Negative    [place] 2+    Johnson 2+    Weed mix 3+    Tree mix Negative    Mold 1 3+    Mold 2 3+    Mold 3 3+    Mold 4 3+    Cat Negative    Dog Negative    Cockroach 3+             Past Medical History: Patient Active Problem List   Diagnosis Date Noted   Asthma, not well controlled 10/30/2020   Other allergic rhinitis 10/30/2020   History of nasal polyp 10/30/2020   Heartburn 10/30/2020   Angina pectoris (HCC) 09/04/2020   Atypical chest pain    Shortness of breath    Acute diarrhea 11/20/2019   Change in bowel habits 11/20/2019   Elevated LFTs 11/20/2019   Fatty liver 11/20/2019   History of esophagitis 11/20/2019   Gastritis 03/11/2019   Screening for  viral disease 03/11/2019    Musculoskeletal pain 03/11/2019   Esophagitis 03/11/2019   LUQ pain 03/11/2019   Hav (hallux abducto valgus), right 12/21/2018   Hyperlipidemia associated with type 2 diabetes mellitus (HCC) 05/14/2018   Primary localized osteoarthritis of right knee 03/02/2018   Status post right partial knee replacement 03/02/2018   Morbid obesity (HCC) 07/27/2017   Type 2 diabetes mellitus without complication, without long-term current use of insulin (HCC) 05/12/2017   Chronic frontal sinusitis 01/02/2017   Essential hypertension 10/21/2016   Hyperlipidemia LDL goal <100 10/21/2016   Elevated serum glucose 10/21/2016   Vitamin D deficiency 10/21/2016   Need for Tdap vaccination 10/21/2016   Past Medical History:  Diagnosis Date   Arthritis    Asthma    Diabetes mellitus without complication (HCC)    type 2   Hypertension    Primary localized osteoarthritis of right knee 03/02/2018   Past Surgical History: Past Surgical History:  Procedure Laterality Date   CYSTECTOMY     tailbone   ETHMOIDECTOMY Bilateral 04/07/2017   Procedure: BILATERAL ETHMOIDECTOMY AND SPHENOIDECTOMY;  Surgeon: Newman Pies, MD;  Location: [facility];  Service: ENT;  Laterality: Bilateral;   KNEE ARTHROSCOPY W/ MENISCAL REPAIR     right and left knee one year apart   LEFT HEART CATH AND CORONARY ANGIOGRAPHY N/A 09/04/2020   Procedure: LEFT HEART CATH AND CORONARY ANGIOGRAPHY;  Surgeon: Swaziland, Peter M, MD;  Location: [facility];  Service: Cardiovascular;  Laterality: N/A;   PARTIAL KNEE ARTHROPLASTY Right 03/02/2018   Procedure: UNICOMPARTMENTAL KNEE;  Surgeon: Teryl Lucy, MD;  Location: [facility];  Service: Orthopedics;  Laterality: Right;  with block   SINUS ENDO WITH FUSION Bilateral 04/07/2017   Procedure: BILATERAL FRONTAL RECESS SINUS EXPLORATION WITH SINUS FUSION NAVIGATION;  Surgeon: Newman Pies, MD;  Location: [facility];  Service: ENT;  Laterality: Bilateral;   SINUS EXPLORATION      TONSILLECTOMY     TURBINATE REDUCTION Bilateral 04/07/2017   Procedure: BILATERAL TURBINATE REDUCTION;  Surgeon: Newman Pies, MD;  Location: [facility];  Service: ENT;  Laterality: Bilateral;   unicompartmental right knee     Medication List:  Current Outpatient Medications  Medication Sig Dispense Refill   acetaminophen (TYLENOL) 650 MG CR tablet Take 1,300 mg by mouth every 8 (eight) hours as needed for pain.     albuterol (VENTOLIN HFA) 108 (90 Base) MCG/ACT inhaler Inhale 2 puffs into the lungs every 4 (four) hours as needed for wheezing or shortness of breath. 18 g 3   amLODipine (NORVASC) 10 MG tablet Take 1 tablet by mouth once daily 90 tablet 0   Ascorbic Acid (VITAMIN C) 1000 MG tablet Take 1,000 mg by mouth daily.     aspirin EC 81 MG tablet Take 81 mg by mouth daily.     atorvastatin (LIPITOR) 40 MG tablet Take 1 tablet (40 mg total) by mouth daily. 90 tablet 2   azelastine (ASTELIN) 0.1 % nasal spray Place 1-2 sprays into both nostrils 2 (two) times daily as needed (nasal drainage). Use in each nostril as directed 30 mL 5   Calcium Polycarbophil (FIBER) 625 MG TABS Take 625 mg by mouth daily as needed (indigestion / constipation).     cholecalciferol (VITAMIN D) 1000 units tablet Take 1,000 Units by mouth daily.     dicyclomine (BENTYL) 10 MG capsule Take 1 capsule (10 mg total) by mouth 4 (four) times daily -  before meals  and at bedtime. (Patient taking differently: Take 10 mg by mouth 3 (three) times daily as needed for spasms.) 60 capsule 1   fluticasone (FLONASE) 50 MCG/ACT nasal spray Place 2 sprays into both nostrils at bedtime.     Fluticasone-Umeclidin-Vilant (TRELEGY ELLIPTA) 200-62.5-25 MCG/INH AEPB Inhale 1 puff into the lungs daily. Rinse mouth after each use. 60 each 5   gabapentin (NEURONTIN) 300 MG capsule Take 1 capsule by mouth at bedtime 90 capsule 0   hydrochlorothiazide (HYDRODIURIL) 12.5 MG tablet Take 1 tablet (12.5 mg total) by mouth daily. 90  tablet 3   levocetirizine (XYZAL) 5 MG tablet Take 1 tablet (5 mg total) by mouth every evening. 90 tablet 1   metFORMIN (GLUCOPHAGE) 1000 MG tablet Take 1 tablet (1,000 mg total) by mouth 2 (two) times daily with a meal. 180 tablet 1   metoprolol succinate (TOPROL-XL) 50 MG 24 hr tablet TAKE 1 TABLET BY MOUTH ONCE DAILY(TAKE WITH OR IMMEDIATELY FOLLOWING A MEAL) 90 tablet 3   montelukast (SINGULAIR) 10 MG tablet TAKE 1 TABLET BY MOUTH AT BEDTIME 90 tablet 0   omeprazole (PRILOSEC) 40 MG capsule Take 1 capsule by mouth once daily (Patient taking differently: Take 40 mg by mouth at bedtime.) 30 capsule 11   oxybutynin (DITROPAN-XL) 10 MG 24 hr tablet Take 1 tablet (10 mg total) by mouth daily. 30 tablet 11   Probiotic Product (PROBIOTIC PO) Take 1 capsule by mouth daily.     tamsulosin (FLOMAX) 0.4 MG CAPS capsule Take 1 capsule (0.4 mg total) by mouth daily. 90 capsule 3   TRULICITY 0.75 MG/0.5ML SOPN INJECT 0.75MG   SUBCUTANEOUSLY ONCE A WEEK 4 mL 0   valsartan (DIOVAN) 80 MG tablet Take 1 tablet (80 mg total) by mouth daily. (Patient taking differently: Take 80 mg by mouth every evening.) 90 tablet 3   Current Facility-Administered Medications  Medication Dose Route Frequency Provider Last Rate Last Admin   sodium chloride flush (NS) 0.9 % injection 3 mL  3 mL Intravenous Q12H Azalee Course, PA       Allergies: No Known Allergies Social History: Social History   Socioeconomic History   Marital status: Single    Spouse name: Not on file   Number of children: 1   Years of education: Not on file   Highest education level: Not on file  Occupational History   Occupation: maintance   Tobacco Use   Smoking status: Former    Pack years: 0.00    Types: Cigarettes   Smokeless tobacco: Never   Tobacco comments:    quit 10 years ago  Vaping Use   Vaping Use: Never used  Substance and Sexual Activity   Alcohol use: No   Drug use: No   Sexual activity: Yes  Other Topics Concern   Not on  file  Social History Narrative   Not on file   Social Determinants of Health   Financial Resource Strain: Not on file  Food Insecurity: Not on file  Transportation Needs: Not on file  Physical Activity: Not on file  Stress: Not on file  Social Connections: Not on file   Lives in a 52[place]31 year old home. Smoking: quit Occupation: maintenance  Environmental History: Water Damage/mildew in the house: no Carpet in the family room: yes Carpet in the bedroom: yes Heating: electric Cooling: central Pet: no  Family History: Family History  Problem Relation Age of Onset   Hypertension Mother    Colon cancer Neg Hx  Colon polyps Neg Hx    Stomach cancer Neg Hx    Rectal cancer Neg Hx    Esophageal cancer Neg Hx    Inflammatory bowel disease Neg Hx    Liver disease Neg Hx    Pancreatic cancer Neg Hx    Problem                               Relation Asthma                                   No  Eczema                                No  Food allergy                          No  Allergic rhino conjunctivitis     daughter   Review of Systems  Constitutional:  Negative for appetite change, chills, fever and unexpected weight change.  HENT:  Positive for congestion, postnasal drip, rhinorrhea and sneezing.   Eyes:  Negative for itching.  Respiratory:  Positive for cough, chest tightness, shortness of breath and wheezing.   Cardiovascular:  Negative for chest pain.  Gastrointestinal:  Negative for abdominal pain.  Genitourinary:  Negative for difficulty urinating.  Skin:  Negative for rash.  Allergic/Immunologic: Positive for environmental allergies. Negative for food allergies.  Neurological:  Negative for headaches.   Objective: BP 128/80   Pulse 84   Temp 97.8 F (36.6 C) (Temporal)   Resp 18   Ht 6\' 4"  (1.93 m)   Wt (!) 339 lb (153.8 kg)   SpO2 95%   BMI 41.26 kg/m  Body mass index is 41.26 kg/m. Physical Exam Vitals and nursing note reviewed.  Constitutional:       Appearance: Normal appearance. He is well-developed.  HENT:     Head: Normocephalic and atraumatic.     Right Ear: External ear normal.     Left Ear: External ear normal.     Nose: Nose normal.     Mouth/Throat:     Mouth: Mucous membranes are moist.     Pharynx: Oropharynx is clear.  Eyes:     Conjunctiva/sclera: Conjunctivae normal.  Cardiovascular:     Rate and Rhythm: Normal rate and regular rhythm.     Heart sounds: Normal heart sounds. No murmur heard.   No friction rub. No gallop.  Pulmonary:     Effort: Pulmonary effort is normal.     Breath sounds: Normal breath sounds. No wheezing, rhonchi or rales.  Abdominal:     Palpations: Abdomen is soft.  Musculoskeletal:     Cervical back: Neck supple.  Skin:    General: Skin is warm.     Findings: No rash.  Neurological:     Mental Status: He is alert and oriented to person, place, and time.  Psychiatric:        Behavior: Behavior normal.  The plan was reviewed with the patient/family, and all questions/concerned were addressed.  It was my pleasure to see Onalee HuaDavid today and participate in his care. Please feel free to contact me with any questions or concerns.  Sincerely,  Wyline MoodYoon Aleyah Balik, DO Allergy & Immunology  [facility]  [employer] office: [place] office: [phone]

## 2020-10-30 ENCOUNTER — Other Ambulatory Visit: Payer: Self-pay

## 2020-10-30 ENCOUNTER — Encounter: Payer: Self-pay | Admitting: Allergy

## 2020-10-30 ENCOUNTER — Ambulatory Visit (INDEPENDENT_AMBULATORY_CARE_PROVIDER_SITE_OTHER): Payer: BC Managed Care – PPO | Admitting: Allergy

## 2020-10-30 VITALS — BP 128/80 | HR 84 | Temp 97.8°F | Resp 18 | Ht 76.0 in | Wt 339.0 lb

## 2020-10-30 DIAGNOSIS — Z8709 Personal history of other diseases of the respiratory system: Secondary | ICD-10-CM | POA: Diagnosis not present

## 2020-10-30 DIAGNOSIS — J45909 Unspecified asthma, uncomplicated: Secondary | ICD-10-CM

## 2020-10-30 DIAGNOSIS — R12 Heartburn: Secondary | ICD-10-CM

## 2020-10-30 DIAGNOSIS — J3089 Other allergic rhinitis: Secondary | ICD-10-CM | POA: Diagnosis not present

## 2020-10-30 MED ORDER — TRELEGY ELLIPTA 200-62.5-25 MCG/INH IN AEPB: 1.0000 | INHALATION_SPRAY | Freq: Every day | RESPIRATORY_TRACT | 5 refills | Status: DC

## 2020-10-30 MED ORDER — AZELASTINE HCL 0.1 % NA SOLN: 1.0000 | Freq: Two times a day (BID) | NASAL | 5 refills | Status: DC | PRN

## 2020-10-30 NOTE — Patient Instructions (Addendum)
Today's skin testing showed: Positive to grass, ragweed, weed, mold, dust mites, cockroach. Results given.   Asthma: Daily controller medication(s): INCREASE Trelegy to 1 puff once a day and rinse mouth after each use. Sample given.  Continue Singulair (montelukast) 10mg  daily at night. May use albuterol rescue inhaler 2 puffs every 4 to 6 hours as needed for shortness of breath, chest tightness, coughing, and wheezing. May use albuterol rescue inhaler 2 puffs 5 to 15 minutes prior to strenuous physical activities. Monitor frequency of use.  Asthma control goals:  Full participation in all desired activities (may need albuterol before activity) Albuterol use two times or less a week on average (not counting use with activity) Cough interfering with sleep two times or less a month Oral steroids no more than once a year No hospitalizations  Environmental allergies Start environmental control measures as below. Use over the counter antihistamines such as Zyrtec (cetirizine), Claritin (loratadine), Allegra (fexofenadine), or Xyzal (levocetirizine) daily as needed. May take twice a day during allergy flares. May switch antihistamines every few months. Continue Singulair (montelukast) 10mg  daily at night. Use Flonase (fluticasone) nasal spray 1 spray per nostril twice a day as needed for nasal congestion.  Use azelastine nasal spray 1-2 sprays per nostril twice a day as needed for runny nose/drainage. Nasal saline spray (i.e., Simply Saline) or nasal saline lavage (i.e., NeilMed) is recommended as needed and prior to medicated nasal sprays.  Consider allergy injections for long term control if above medications do not help the symptoms - handout given. Need asthma to be better controlled.   Follow up in 2 months or sooner if needed.   Reducing Pollen Exposure Pollen seasons: trees (spring), grass (summer) and ragweed/weeds (fall). Keep windows closed in your home and car to lower pollen  exposure.  Install air conditioning in the bedroom and throughout the house if possible.  Avoid going out in dry windy days - especially early morning. Pollen counts are highest between 5 - 10 AM and on dry, hot and windy days.  Save outside activities for late afternoon or after a heavy rain, when pollen levels are lower.  Avoid mowing of grass if you have grass pollen allergy. Be aware that pollen can also be transported indoors on people and pets.  Dry your clothes in an automatic dryer rather than hanging them outside where they might collect pollen.  Rinse hair and eyes before bedtime.  Mold Control Mold and fungi can grow on a variety of surfaces provided certain temperature and moisture conditions exist.  Outdoor molds grow on plants, decaying vegetation and soil. The major outdoor mold, Alternaria and Cladosporium, are found in very high numbers during hot and dry conditions. Generally, a late summer - fall peak is seen for common outdoor fungal spores. Rain will temporarily lower outdoor mold spore count, but counts rise rapidly when the rainy period ends. The most important indoor molds are Aspergillus and Penicillium. Dark, humid and poorly ventilated basements are ideal sites for mold growth. The next most common sites of mold growth are the bathroom and the kitchen. Outdoor (Seasonal) Mold Control Use air conditioning and keep windows closed. Avoid exposure to decaying vegetation. Avoid leaf raking. Avoid grain handling. Consider wearing a face mask if working in moldy areas.  Indoor (Perennial) Mold Control  Maintain humidity below 50%. Get rid of mold growth on hard surfaces with water, detergent and, if necessary, 5% bleach (do not mix with other cleaners). Then dry the area completely. If mold  covers an area more than 10 square feet, consider hiring an indoor environmental professional. For clothing, washing with soap and water is best. If moldy items cannot be cleaned and  dried, throw them away. Remove sources e.g. contaminated carpets. Repair and seal leaking roofs or pipes. Using dehumidifiers in damp basements may be helpful, but empty the water and clean units regularly to prevent mildew from forming. All rooms, especially basements, bathrooms and kitchens, require ventilation and cleaning to deter mold and mildew growth. Avoid carpeting on concrete or damp floors, and storing items in damp areas. Control of House Dust Mite Allergen Dust mite allergens are a common trigger of allergy and asthma symptoms. While they can be found throughout the house, these microscopic creatures thrive in warm, humid environments such as bedding, upholstered furniture and carpeting. Because so much time is spent in the bedroom, it is essential to reduce mite levels there.  Encase pillows, mattresses, and box springs in special allergen-proof fabric covers or airtight, zippered plastic covers.  Bedding should be washed weekly in hot water (130 F) and dried in a hot dryer. Allergen-proof covers are available for comforters and pillows that can't be regularly washed.  Wash the allergy-proof covers every few months. Minimize clutter in the bedroom. Keep pets out of the bedroom.  Keep humidity less than 50% by using a dehumidifier or air conditioning. You can buy a humidity measuring device called a hygrometer to monitor this.  If possible, replace carpets with hardwood, linoleum, or washable area rugs. If that's not possible, vacuum frequently with a vacuum that has a HEPA filter. Remove all upholstered furniture and non-washable window drapes from the bedroom. Remove all non-washable stuffed toys from the bedroom.  Wash stuffed toys weekly. Cockroach Allergen Avoidance Cockroaches are often found in the homes of densely populated urban areas, schools or commercial buildings, but these creatures can lurk almost anywhere. This does not mean that you have a dirty house or living  area. Block all areas where roaches can enter the home. This includes crevices, wall cracks and windows.  Cockroaches need water to survive, so fix and seal all leaky faucets and pipes. Have an exterminator go through the house when your family and pets are gone to eliminate any remaining roaches. Keep food in lidded containers and put pet food dishes away after your pets are done eating. Vacuum and sweep the floor after meals, and take out garbage and recyclables. Use lidded garbage containers in the kitchen. Wash dishes immediately after use and clean under stoves, refrigerators or toasters where crumbs can accumulate. Wipe off the stove and other kitchen surfaces and cupboards regularly.

## 2020-10-30 NOTE — Assessment & Plan Note (Signed)
Perennial rhinoconjunctivitis symptoms for 20+ years with worsening in the spring and fall.  Tried Xyzal, Singulair and Flonase with good benefit.  No prior AIT.  Today's skin testing showed: Positive to grass, ragweed, weed, mold, dust mites, cockroach.  Start environmental control measures as below.  Use over the counter antihistamines such as Zyrtec (cetirizine), Claritin (loratadine), Allegra (fexofenadine), or Xyzal (levocetirizine) daily as needed. May take twice a day during allergy flares. May switch antihistamines every few months.  Continue Singulair (montelukast) 10mg  daily at night.  Use Flonase (fluticasone) nasal spray 1 spray per nostril twice a day as needed for nasal congestion.   Use azelastine nasal spray 1-2 sprays per nostril twice a day as needed for runny nose/drainage.  Nasal saline spray (i.e., Simply Saline) or nasal saline lavage (i.e., NeilMed) is recommended as needed and prior to medicated nasal sprays.  Consider allergy injections for long term control if above medications do not help the symptoms - handout given. Asthma needs to be under better control before starting.

## 2020-10-30 NOTE — Assessment & Plan Note (Signed)
History of nasal polyps with 2 sinus surgeries in the past.  See assessment and plan as above for allergic rhinitis.  Monitor symptoms.

## 2020-10-30 NOTE — Assessment & Plan Note (Signed)
Diagnosed with asthma over 5 years ago but worsening symptoms the last 2 months.  Recently started on Trelegy 100 mcg 1 puff once a day with some benefit but still using albuterol daily.  Today's spirometry shows some restriction with 10% and greater than 200 cc improvement in FEV1 post bronchodilator treatment.  Clinically feeling improved.  Patient also used his own albuterol inhaler 2 hours before visit. . Daily controller medication(s): INCREASE Trelegy to 1 puff once a day and rinse mouth after each use. Sample given and demonstrated proper use.  . Continue Singulair (montelukast) 10mg  daily at night. . May use albuterol rescue inhaler 2 puffs every 4 to 6 hours as needed for shortness of breath, chest tightness, coughing, and wheezing. May use albuterol rescue inhaler 2 puffs 5 to 15 minutes prior to strenuous physical activities. Monitor frequency of use.   Get spirometry at next visit.

## 2020-11-14 ENCOUNTER — Other Ambulatory Visit: Payer: Self-pay | Admitting: Gastroenterology

## 2020-11-14 ENCOUNTER — Other Ambulatory Visit: Payer: Self-pay | Admitting: Family Medicine

## 2020-11-14 DIAGNOSIS — E669 Obesity, unspecified: Secondary | ICD-10-CM

## 2020-11-14 DIAGNOSIS — I10 Essential (primary) hypertension: Secondary | ICD-10-CM

## 2020-11-14 DIAGNOSIS — E1169 Type 2 diabetes mellitus with other specified complication: Secondary | ICD-10-CM

## 2020-11-14 DIAGNOSIS — J3089 Other allergic rhinitis: Secondary | ICD-10-CM

## 2020-11-15 ENCOUNTER — Other Ambulatory Visit: Payer: Self-pay

## 2020-11-15 DIAGNOSIS — E1169 Type 2 diabetes mellitus with other specified complication: Secondary | ICD-10-CM

## 2020-11-15 DIAGNOSIS — J3089 Other allergic rhinitis: Secondary | ICD-10-CM

## 2020-11-15 DIAGNOSIS — I10 Essential (primary) hypertension: Secondary | ICD-10-CM

## 2020-11-16 ENCOUNTER — Telehealth: Payer: Self-pay

## 2020-11-16 NOTE — Telephone Encounter (Signed)
Drug Trulicity 0.75MG /0.5ML pen-injectors Key: [id number] Sent to plan via CoverMyMeds.

## 2020-11-19 NOTE — Telephone Encounter (Signed)
PA Response - Approved

## 2020-11-20 ENCOUNTER — Other Ambulatory Visit: Payer: Self-pay | Admitting: Family Medicine

## 2020-11-20 DIAGNOSIS — E669 Obesity, unspecified: Secondary | ICD-10-CM

## 2020-11-20 DIAGNOSIS — E1169 Type 2 diabetes mellitus with other specified complication: Secondary | ICD-10-CM

## 2020-12-12 ENCOUNTER — Other Ambulatory Visit: Payer: Self-pay | Admitting: Gastroenterology

## 2020-12-25 ENCOUNTER — Other Ambulatory Visit: Payer: Self-pay

## 2020-12-25 ENCOUNTER — Ambulatory Visit (HOSPITAL_BASED_OUTPATIENT_CLINIC_OR_DEPARTMENT_OTHER): Payer: 59 | Attending: Family Medicine | Admitting: Internal Medicine

## 2020-12-25 VITALS — Ht 76.0 in | Wt 340.0 lb

## 2020-12-25 DIAGNOSIS — G4733 Obstructive sleep apnea (adult) (pediatric): Secondary | ICD-10-CM | POA: Insufficient documentation

## 2020-12-25 DIAGNOSIS — R0602 Shortness of breath: Secondary | ICD-10-CM | POA: Diagnosis not present

## 2020-12-25 DIAGNOSIS — R400 Somnolence: Secondary | ICD-10-CM | POA: Insufficient documentation

## 2020-12-25 DIAGNOSIS — R0683 Snoring: Secondary | ICD-10-CM | POA: Diagnosis not present

## 2020-12-28 ENCOUNTER — Other Ambulatory Visit: Payer: Self-pay

## 2020-12-28 MED ORDER — OMEPRAZOLE 40 MG PO CPDR: 40.0000 mg | DELAYED_RELEASE_CAPSULE | Freq: Every day | ORAL | 3 refills | Status: DC

## 2020-12-30 DIAGNOSIS — R400 Somnolence: Secondary | ICD-10-CM

## 2020-12-30 NOTE — Procedures (Signed)
Patient Name: Scott Buchanan, Scott Buchanan Date: 12/25/2020 Gender: Male D.O.B: 05/13/1965 Age (years): 55 Referring Provider: Cammie Sickle Height (inches): 76 Interpreting Physician: Baird Lyons MD, ABSM Weight (lbs): 340 RPSGT: Gwenyth Allegra BMI: 41 MRN: [id number] Neck Size: 21.00  CLINICAL INFORMATION Sleep Study Type: Split Night CPAP Indication for sleep study: Diabetes, Hypertension, Snoring Epworth Sleepiness Score: 8  SLEEP STUDY TECHNIQUE As per the AASM Manual for the Scoring of Sleep and Associated Events v2.3 (April 2016) with a hypopnea requiring 4% desaturations.  The channels recorded and monitored were frontal, central and occipital EEG, electrooculogram (EOG), submentalis EMG (chin), nasal and oral airflow, thoracic and abdominal wall motion, anterior tibialis EMG, snore microphone, electrocardiogram, and pulse oximetry. Continuous positive airway pressure (CPAP) was initiated when the patient met split night criteria and was titrated according to treat sleep-disordered breathing.  MEDICATIONS Medications self-administered by patient taken the night of the study : none reported  RESPIRATORY PARAMETERS Diagnostic  Total AHI (/hr): 35.7 RDI (/hr): 39.0 OA Index (/hr): 0.5 CA Index (/hr): 0.0 REM AHI (/hr): N/A NREM AHI (/hr): 35.7 Supine AHI (/hr): 72.0 Non-supine AHI (/hr): 35 Min O2 Sat (%): 84.0 Mean O2 (%): 90.2 Time below 88% (min): 12.8   Titration  Optimal Pressure (cm): 17 AHI at Optimal Pressure (/hr): 3.9 Min O2 at Optimal Pressure (%): 91.0 Supine % at Optimal (%): 100 Sleep % at Optimal (%): 70   SLEEP ARCHITECTURE The recording time for the entire night was 365.2 minutes.  During a baseline period of 150.7 minutes, the patient slept for 126.0 minutes in REM and nonREM, yielding a sleep efficiency of 83.6%%. Sleep onset after lights out was 11.0 minutes with a REM latency of N/A minutes. The patient spent 14.3%% of the night in stage N1 sleep,  85.7%% in stage N2 sleep, 0.0%% in stage N3 and 0% in REM.  During the titration period of 213.6 minutes, the patient slept for 188.5 minutes in REM and nonREM, yielding a sleep efficiency of 88.3%%. Sleep onset after CPAP initiation was 12.3 minutes with a REM latency of 45.5 minutes. The patient spent 5.0%% of the night in stage N1 sleep, 53.8%% in stage N2 sleep, 0.0%% in stage N3 and 41.1% in REM.  CARDIAC DATA The 2 lead EKG demonstrated sinus rhythm. The mean heart rate was 100.0 beats per minute. Other EKG findings include: None.  LEG MOVEMENT DATA The total Periodic Limb Movements of Sleep (PLMS) were 0. The PLMS index was 0.0 .  IMPRESSIONS - Severe obstructive sleep apnea occurred during the diagnostic portion of the study (AHI = 35.7/hour). An optimal PAP pressure was selected for this patient ( 17 cm of water) - No significant central sleep apnea occurred during the diagnostic portion of the study (CAI = 0.0/hour). - Moderate oxygen desaturation was noted during the diagnostic portion of the study (Min O2 =84.0%). Minimum O2 saturation on CPAP 17 was 91%. - The patient snored with moderate snoring volume during the diagnostic portion of the study. - No cardiac abnormalities were noted during this study. - Clinically significant periodic limb movements did not occur during sleep.  DIAGNOSIS - Obstructive Sleep Apnea (G47.33)  RECOMMENDATIONS - Trial of CPAP therapy on 17 cm H2O or Autopap 10-20. - Patient used a Large size Fisher&Paykel Full Face Simplus mask and heated humidification. - Be careful with alcohol, sedatives and other CNS depressants that may worsen sleep apnea and disrupt normal sleep architecture. - Sleep hygiene should be reviewed to assess factors  that may improve sleep quality. - Weight management and regular exercise should be initiated or continued.  [Electronically signed] 12/30/2020 11:13 AM  Baird Lyons MD, ABSM Diplomate, American Board of Sleep  Medicine   NPI: [id number]                         [place] [place] [facility]  ELECTRONICALLY SIGNED ON:  12/30/2020, 11:10 AM [place] PH: [phone]   FX: [phone] [place]

## 2021-01-01 ENCOUNTER — Other Ambulatory Visit: Payer: Self-pay | Admitting: Family Medicine

## 2021-01-04 ENCOUNTER — Other Ambulatory Visit (HOSPITAL_COMMUNITY): Payer: Self-pay

## 2021-01-10 ENCOUNTER — Other Ambulatory Visit: Payer: Self-pay

## 2021-01-10 DIAGNOSIS — E1169 Type 2 diabetes mellitus with other specified complication: Secondary | ICD-10-CM

## 2021-01-10 MED ORDER — TRULICITY 0.75 MG/0.5ML ~~LOC~~ SOAJ: 0.7500 mg | SUBCUTANEOUS | 0 refills | Status: DC

## 2021-01-15 ENCOUNTER — Other Ambulatory Visit: Payer: Self-pay

## 2021-01-15 ENCOUNTER — Telehealth: Payer: Self-pay | Admitting: Family Medicine

## 2021-01-15 ENCOUNTER — Encounter: Payer: Self-pay | Admitting: Family Medicine

## 2021-01-15 ENCOUNTER — Ambulatory Visit (INDEPENDENT_AMBULATORY_CARE_PROVIDER_SITE_OTHER): Payer: 59 | Admitting: Family Medicine

## 2021-01-15 VITALS — BP 140/86 | HR 65 | Temp 98.0°F | Ht 76.0 in | Wt 335.0 lb

## 2021-01-15 DIAGNOSIS — E1169 Type 2 diabetes mellitus with other specified complication: Secondary | ICD-10-CM

## 2021-01-15 DIAGNOSIS — R82998 Other abnormal findings in urine: Secondary | ICD-10-CM

## 2021-01-15 DIAGNOSIS — E669 Obesity, unspecified: Secondary | ICD-10-CM | POA: Diagnosis not present

## 2021-01-15 DIAGNOSIS — I10 Essential (primary) hypertension: Secondary | ICD-10-CM

## 2021-01-15 LAB — POCT GLYCOSYLATED HEMOGLOBIN (HGB A1C)
HbA1c POC (<> result, manual entry): 6.9 % (ref 4.0–5.6)
HbA1c, POC (controlled diabetic range): 6.9 % (ref 0.0–7.0)
HbA1c, POC (prediabetic range): 6.9 % — AB (ref 5.7–6.4)
Hemoglobin A1C: 6.9 % — AB (ref 4.0–5.6)

## 2021-01-15 LAB — POCT URINALYSIS DIP (CLINITEK)
Bilirubin, UA: NEGATIVE
Blood, UA: NEGATIVE
Glucose, UA: NEGATIVE mg/dL
Ketones, POC UA: NEGATIVE mg/dL
Nitrite, UA: NEGATIVE
POC PROTEIN,UA: NEGATIVE
Spec Grav, UA: 1.01 (ref 1.010–1.025)
Urobilinogen, UA: 0.2 E.U./dL
pH, UA: 5.5 (ref 5.0–8.0)

## 2021-01-15 NOTE — Patient Instructions (Signed)
Diabetes Mellitus Action Plan °Following a diabetes action plan is a way for you to manage your diabetes (diabetes mellitus) symptoms. The plan is color-coded to help you understand what actions you need to take based on any symptoms you are having. °If you have symptoms in the red zone, you need medical care right away. °If you have symptoms in the yellow zone, you are having problems. °If you have symptoms in the green zone, you are doing well. °Learning about and understanding diabetes can take time. Follow the plan that you develop with your health care provider. Know the target range for your blood sugar (glucose) level, and review your treatment plan with your health care provider at each visit. °The target range for my blood sugar level is __________________________ mg/dL. °Red zone °Get medical help right away if you have any of the following symptoms: °A blood sugar test result that is below 54 mg/dL (3 mmol/L). °A blood sugar test result that is at or above 240 mg/dL (13.3 mmol/L) for 2 days in a row. °Confusion or trouble thinking clearly. °Difficulty breathing. °Sickness or a fever for 2 or more days that is not getting better. °Moderate or large ketone levels in your urine. °Feeling tired or having no energy. °If you have any red zone symptoms, do not wait to see if the symptoms will go away. Get medical help right away. Call your local emergency services (911 in the [place] Do not drive yourself to the hospital. °If you have severely low blood sugar (severe hypoglycemia) and you cannot eat or drink, you may need glucagon. Make sure a family member or close friend knows how to check your blood sugar and how to give you glucagon. You may need to be treated in a hospital for this condition. °Yellow zone °If you have any of the following symptoms, your diabetes is not under control and you may need to make some changes: °A blood sugar test result that is at or above 240 mg/dL (13.3 mmol/L) for 2 days in a  row. °Blood sugar test results that are below 70 mg/dL (3.9 mmol/L). °Other symptoms of hypoglycemia, such as: °Shaking or feeling light-headed. °Confusion or irritability. °Feeling hungry. °Having a fast heartbeat. °If you have any yellow zone symptoms: °Treat your hypoglycemia by eating or drinking 15 grams of a rapid-acting carbohydrate. Follow the 15:15 rule: °Take 15 grams of a rapid-acting carbohydrate, such as: °1 tube of glucose gel. °4 glucose pills. °4 oz (120 mL) of fruit juice. °4 oz (120 mL) of regular (not diet) soda. °Check your blood sugar 15 minutes after you take the carbohydrate. °If the repeat blood sugar test is still at or below 70 mg/dL (3.9 mmol/L), take 15 grams of a carbohydrate again. °If your blood sugar does not increase above 70 mg/dL (3.9 mmol/L) after 3 tries, get medical help right away. °After your blood sugar returns to normal, eat a meal or a snack within 1 hour. °Keep taking your daily medicines as told by your health care provider. °Check your blood sugar more often than you normally would. °Write down your results. °Call your health care provider if you have trouble keeping your blood sugar in your target range. ° °Green zone °These signs mean you are doing well and you can continue what you are doing to manage your diabetes: °Your blood sugar is within your personal target range. For most people, a blood sugar level before a meal (preprandial) should be 80-130 mg/dL (4.4-7.2 mmol/L). °You feel   well, and you are able to do daily activities. °If you are in the green zone, continue to manage your diabetes as told by your health care provider. To do this: °Eat a healthy diet. °Exercise regularly. °Check your blood sugar as told by your health care provider. °Take your medicines as told by your health care provider. ° °Where to find more information °American Diabetes Association (ADA): diabetes.org °Association of Diabetes Care & Education Specialists (ADCES):  diabeteseducator.org °Summary °Following a diabetes action plan is a way for you to manage your diabetes symptoms. The plan is color-coded to help you understand what actions you need to take based on any symptoms you are having. °Follow the plan that you develop with your health care provider. Make sure you know your personal target blood sugar level. °Review your treatment plan with your health care provider at each visit. °This information is not intended to replace advice given to you by your health care provider. Make sure you discuss any questions you have with your health care provider. °Document Revised: 10/13/2019 Document Reviewed: 10/13/2019 °Elsevier Patient Education © 2022 [employer] ° °

## 2021-01-15 NOTE — Progress Notes (Signed)
[facility]   Established Patient Office Visit  Subjective:  Patient ID: Scott Buchanan, male    DOB: Mar 02, 1966  Age: 55 y.o. MRN: [id number]  CC:  Chief Complaint  Patient presents with   Follow-up    Pt is here today for his 3 month follow up for his diabetes and hypertension. Pt states that he needs to discuss his sleep study because he hasn't heard anything about his results.    HPI Scott Buchanan is a 55 year old male with a medical history significant for type 2 diabetes mellitus, hypertension, hyperlipidemia, and moderate persistent asthma presents for follow-up of chronic conditions.  Patient has a new diagnosis of obstructive sleep apnea.  Patient says that he has been doing fairly well and has minimal complaints today. His compliance includes nasal congestion and sneezing over the past several days.  He attributes current flare to changes in weather.  Patient also underwent a sleep study January 04, 2021, he is awaiting CPAP.  Diabetes He presents for his follow-up diabetic visit. His disease course has been stable. Pertinent negatives for hypoglycemia include no headaches. Pertinent negatives for diabetes include no blurred vision, no chest pain, no fatigue, no foot paresthesias, no polydipsia, no polyphagia, no polyuria, no weakness and no weight loss. Risk factors for coronary artery disease include dyslipidemia and hypertension. Current diabetic treatment includes diet. He is following a diabetic diet. There is no change in his home blood glucose trend. He does not see a podiatrist.Eye exam is current.  Hypertension This is a chronic problem. Pertinent negatives include no anxiety, blurred vision, chest pain, headaches, orthopnea, palpitations, peripheral edema or shortness of breath. Risk factors for coronary artery disease include obesity and sedentary lifestyle. Compliance problems include diet.  There is no history of kidney  disease or CAD/MI.     Past Medical History:  Diagnosis Date   Arthritis    Asthma    Diabetes mellitus without complication ([place])    type 2   Hypertension    Primary localized osteoarthritis of right knee 03/02/2018    Past Surgical History:  Procedure Laterality Date   CYSTECTOMY     tailbone   ETHMOIDECTOMY Bilateral 04/07/2017   Procedure: BILATERAL ETHMOIDECTOMY AND SPHENOIDECTOMY;  Surgeon: Leta Baptist, MD;  Location: [facility];  Service: ENT;  Laterality: Bilateral;   KNEE ARTHROSCOPY W/ MENISCAL REPAIR     right and left knee one year apart   LEFT HEART CATH AND CORONARY ANGIOGRAPHY N/A 09/04/2020   Procedure: LEFT HEART CATH AND CORONARY ANGIOGRAPHY;  Surgeon: Martinique, Peter M, MD;  Location: [facility];  Service: Cardiovascular;  Laterality: N/A;   PARTIAL KNEE ARTHROPLASTY Right 03/02/2018   Procedure: UNICOMPARTMENTAL KNEE;  Surgeon: Marchia Bond, MD;  Location: [facility];  Service: Orthopedics;  Laterality: Right;  with block   SINUS ENDO WITH FUSION Bilateral 04/07/2017   Procedure: BILATERAL FRONTAL RECESS SINUS EXPLORATION WITH SINUS FUSION NAVIGATION;  Surgeon: Leta Baptist, MD;  Location: [facility];  Service: ENT;  Laterality: Bilateral;   SINUS EXPLORATION     TONSILLECTOMY     TURBINATE REDUCTION Bilateral 04/07/2017   Procedure: BILATERAL TURBINATE REDUCTION;  Surgeon: Leta Baptist, MD;  Location: [facility];  Service: ENT;  Laterality: Bilateral;   unicompartmental right knee      Family History  Problem Relation Age of Onset   Hypertension Mother    Colon cancer Neg Hx  Colon polyps Neg Hx    Stomach cancer Neg Hx    Rectal cancer Neg Hx    Esophageal cancer Neg Hx    Inflammatory bowel disease Neg Hx    Liver disease Neg Hx    Pancreatic cancer Neg Hx     Social History   Socioeconomic History   Marital status: Single    Spouse name: Not on file   Number of children: 1   Years of education: Not on  file   Highest education level: Not on file  Occupational History   Occupation: maintance   Tobacco Use   Smoking status: Former    Types: Cigarettes   Smokeless tobacco: Never   Tobacco comments:    quit 10 years ago  Vaping Use   Vaping Use: Never used  Substance and Sexual Activity   Alcohol use: No   Drug use: No   Sexual activity: Yes  Other Topics Concern   Not on file  Social History Narrative   Not on file   Social Determinants of Health   Financial Resource Strain: Not on file  Food Insecurity: Not on file  Transportation Needs: Not on file  Physical Activity: Not on file  Stress: Not on file  Social Connections: Not on file  Intimate Partner Violence: Not on file    Outpatient Medications Prior to Visit  Medication Sig Dispense Refill   acetaminophen (TYLENOL) 650 MG CR tablet Take 1,300 mg by mouth every 8 (eight) hours as needed for pain.     albuterol (VENTOLIN HFA) 108 (90 Base) MCG/ACT inhaler Inhale 2 puffs into the lungs every 4 (four) hours as needed for wheezing or shortness of breath. 18 g 3   amLODipine (NORVASC) 10 MG tablet Take 1 tablet by mouth once daily 90 tablet 0   Ascorbic Acid (VITAMIN C) 1000 MG tablet Take 1,000 mg by mouth daily.     aspirin EC 81 MG tablet Take 81 mg by mouth daily.     atorvastatin (LIPITOR) 40 MG tablet Take 1 tablet (40 mg total) by mouth daily. 90 tablet 2   azelastine (ASTELIN) 0.1 % nasal spray Place 1-2 sprays into both nostrils 2 (two) times daily as needed (nasal drainage). Use in each nostril as directed 30 mL 5   Calcium Polycarbophil (FIBER) 625 MG TABS Take 625 mg by mouth daily as needed (indigestion / constipation).     cholecalciferol (VITAMIN D) 1000 units tablet Take 1,000 Units by mouth daily.     dicyclomine (BENTYL) 10 MG capsule Take 1 capsule (10 mg total) by mouth 4 (four) times daily -  before meals and at bedtime. (Patient taking differently: Take 10 mg by mouth 3 (three) times daily as needed for  spasms.) 60 capsule 1   Dulaglutide (TRULICITY) 9.16 XI/5.0TU SOPN Inject 0.75 mg into the skin once a week. INJECT 0.75MG SUBCUTANEOUSLY ONCE A WEEK 4 mL 0   fluticasone (FLONASE) 50 MCG/ACT nasal spray Place 2 sprays into both nostrils at bedtime.     Fluticasone-Umeclidin-Vilant (TRELEGY ELLIPTA) 200-62.5-25 MCG/INH AEPB Inhale 1 puff into the lungs daily. Rinse mouth after each use. 60 each 5   gabapentin (NEURONTIN) 300 MG capsule Take 1 capsule by mouth at bedtime 90 capsule 0   hydrochlorothiazide (HYDRODIURIL) 12.5 MG tablet Take 1 tablet (12.5 mg total) by mouth daily. 90 tablet 3   levocetirizine (XYZAL) 5 MG tablet Take 1 tablet (5 mg total) by mouth every evening. 90 tablet 1  metFORMIN (GLUCOPHAGE) 1000 MG tablet Take 1 tablet (1,000 mg total) by mouth 2 (two) times daily with a meal. 180 tablet 1   metoprolol succinate (TOPROL-XL) 50 MG 24 hr tablet TAKE 1 TABLET BY MOUTH ONCE DAILY(TAKE WITH OR IMMEDIATELY FOLLOWING A MEAL) 90 tablet 3   montelukast (SINGULAIR) 10 MG tablet TAKE 1 TABLET BY MOUTH AT BEDTIME 90 tablet 0   omeprazole (PRILOSEC) 40 MG capsule Take 1 capsule (40 mg total) by mouth daily. 30 capsule 3   oxybutynin (DITROPAN-XL) 10 MG 24 hr tablet Take 1 tablet (10 mg total) by mouth daily. 30 tablet 11   Probiotic Product (PROBIOTIC PO) Take 1 capsule by mouth daily.     tamsulosin (FLOMAX) 0.4 MG CAPS capsule Take 1 capsule (0.4 mg total) by mouth daily. 90 capsule 3   valsartan (DIOVAN) 80 MG tablet Take 1 tablet (80 mg total) by mouth daily. (Patient taking differently: Take 80 mg by mouth every evening.) 90 tablet 3   Facility-Administered Medications Prior to Visit  Medication Dose Route Frequency Provider Last Rate Last Admin   sodium chloride flush (NS) 0.9 % injection 3 mL  3 mL Intravenous Q12H Meng, Beulah, PA        No Known Allergies  ROS Review of Systems  Constitutional: Negative.  Negative for fatigue and weight loss.  HENT: Negative.    Eyes:   Negative for blurred vision.  Respiratory: Negative.  Negative for shortness of breath.   Cardiovascular:  Negative for chest pain, palpitations and orthopnea.  Gastrointestinal: Negative.  Negative for diarrhea and nausea.  Endocrine: Negative for polydipsia, polyphagia and polyuria.  Genitourinary:  Negative for difficulty urinating and dysuria.  Musculoskeletal:  Negative for arthralgias and back pain.  Skin: Negative.   Neurological:  Negative for weakness and headaches.  Psychiatric/Behavioral: Negative.       Objective:    Physical Exam Constitutional:      Appearance: Normal appearance. He is obese.  Eyes:     Pupils: Pupils are equal, round, and reactive to light.  Cardiovascular:     Rate and Rhythm: Normal rate and regular rhythm.     Pulses: Normal pulses.  Pulmonary:     Effort: Pulmonary effort is normal.  Abdominal:     General: Bowel sounds are normal.  Skin:    General: Skin is warm.  Neurological:     General: No focal deficit present.     Mental Status: He is alert. Mental status is at baseline.  Psychiatric:        Mood and Affect: Mood normal.        Behavior: Behavior normal.        Thought Content: Thought content normal.        Judgment: Judgment normal.    BP (!) 159/94   Pulse 69   Temp 98 F (36.7 C)   Ht _0  (1.93 m)   Wt (!) 335 lb (152 kg)   SpO2 98%   BMI 40.78 kg/m  Wt Readings from Last 3 Encounters:  01/15/21 (!) 335 lb (152 kg)  12/25/20 (!) 340 lb (154.2 kg)  10/30/20 (!) 339 lb (153.8 kg)     Health Maintenance Due  Topic Date Due   Zoster Vaccines- Shingrix (1 of 2) Never done   COVID-19 Vaccine (3 - Booster for Pfizer series) 04/17/2020    There are no preventive care reminders to display for this patient.  Lab Results  Component Value Date   TSH  1.270 03/13/2020   Lab Results  Component Value Date   WBC 5.8 08/31/2020   HGB 14.4 08/31/2020   HCT 42.4 08/31/2020   MCV 85 08/31/2020   PLT 280 08/31/2020    Lab Results  Component Value Date   NA 140 08/31/2020   K 4.5 08/31/2020   CO2 21 08/31/2020   GLUCOSE 233 (H) 08/31/2020   BUN 13 08/31/2020   CREATININE 0.90 08/31/2020   BILITOT 0.5 06/26/2020   ALKPHOS 71 06/26/2020   AST 43 (H) 06/26/2020   ALT 71 (H) 06/26/2020   PROT 7.2 06/26/2020   ALBUMIN 4.6 06/26/2020   CALCIUM 10.0 08/31/2020   ANIONGAP 13 06/11/2020   EGFR 102 08/31/2020   Lab Results  Component Value Date   CHOL 185 10/09/2020   Lab Results  Component Value Date   HDL 33 (L) 10/09/2020   Lab Results  Component Value Date   LDLCALC 119 (H) 10/09/2020   Lab Results  Component Value Date   TRIG 188 (H) 10/09/2020   Lab Results  Component Value Date   CHOLHDL 5.6 (H) 10/09/2020   Lab Results  Component Value Date   HGBA1C 6.9 (A) 01/15/2021   HGBA1C 6.9 01/15/2021   HGBA1C 6.9 (A) 01/15/2021   HGBA1C 6.9 01/15/2021      Assessment & Plan:   Problem List Items Addressed This Visit       Cardiovascular and Mediastinum   Essential hypertension   Relevant Orders   POCT URINALYSIS DIP (CLINITEK) (Completed)   Comprehensive metabolic panel (Completed)   Other Visit Diagnoses     Diabetes mellitus type 2 in obese ([facility])    -  Primary   Relevant Orders   HgB A1c (Completed)   POCT URINALYSIS DIP (CLINITEK) (Completed)   Comprehensive metabolic panel (Completed)   Urine leukocytes       Relevant Orders   Urine Culture      1. Diabetes mellitus type 2 in obese ([facility]) Hemoglobin A1c is 7.8, which is improved from previous.  We will continue antidiabetic medications without any changes. - HgB A1c - POCT URINALYSIS DIP (CLINITEK) - Comprehensive metabolic panel  2. Essential hypertension - Continue medication, monitor blood pressure at home. Continue DASH diet.  Reminder to go to the ER if any CP, SOB, nausea, dizziness, severe HA, changes vision/speech, left arm numbness and tingling and jaw pain.   - POCT URINALYSIS DIP (CLINITEK) -  Comprehensive metabolic panel  3. Urine leukocytes  - Urine Culture  No orders of the defined types were placed in this encounter.   Follow-up: Return in about 3 months (around 04/16/2021) for diabetes, hypertension.    Donia Pounds  APRN, MSN, FNP-C [facility] [address] [place], [place] [address] [phone]

## 2021-01-15 NOTE — Telephone Encounter (Signed)
Patient underwent sleep study on 12/25/2020.  Reviewed study results, diagnosed with obstructive sleep apnea.  CPAP order has been sent to [facility], abdominal oxygen.   Nolon Nations  APRN, MSN, FNP-C Patient Care [facility] [address] [place], [place] [address] [phone]

## 2021-01-16 ENCOUNTER — Telehealth: Payer: Self-pay | Admitting: Family Medicine

## 2021-01-16 ENCOUNTER — Ambulatory Visit: Payer: BC Managed Care – PPO | Admitting: Allergy

## 2021-01-16 LAB — COMPREHENSIVE METABOLIC PANEL
ALT: 61 IU/L — ABNORMAL HIGH (ref 0–44)
AST: 36 IU/L (ref 0–40)
Albumin/Globulin Ratio: 2.2 (ref 1.2–2.2)
Albumin: 5 g/dL — ABNORMAL HIGH (ref 3.8–4.9)
Alkaline Phosphatase: 55 IU/L (ref 44–121)
BUN/Creatinine Ratio: 14 (ref 9–20)
BUN: 14 mg/dL (ref 6–24)
Bilirubin Total: 0.5 mg/dL (ref 0.0–1.2)
CO2: 26 mmol/L (ref 20–29)
Calcium: 10.6 mg/dL — ABNORMAL HIGH (ref 8.7–10.2)
Chloride: 100 mmol/L (ref 96–106)
Creatinine, Ser: 1 mg/dL (ref 0.76–1.27)
Globulin, Total: 2.3 g/dL (ref 1.5–4.5)
Glucose: 129 mg/dL — ABNORMAL HIGH (ref 70–99)
Potassium: 4.6 mmol/L (ref 3.5–5.2)
Sodium: 143 mmol/L (ref 134–144)
Total Protein: 7.3 g/dL (ref 6.0–8.5)
eGFR: 90 mL/min/{1.73_m2} (ref >59)

## 2021-01-16 NOTE — Telephone Encounter (Signed)
Patient notified of results, verbally understood. No questions or concerns °

## 2021-01-16 NOTE — Telephone Encounter (Signed)
Scott Buchanan is a very pleasant 55 year old male with a medical history significant for type 2 diabetes mellitus, hypertension, hyperlipidemia, obesity, and moderate persistent asthma that presented for follow-up of chronic conditions. Reviewed all laboratory values, AST is mildly elevated.  Continue to follow a low-fat, low carbohydrate diet divided over small meals throughout the day.  Also, we faxed his order for CPAP.  Please refer to previous note pertaining to CPAP.  Have him contact the sleep center if there are any further questions about CPAP.   Nolon Nations  APRN, MSN, FNP-C Patient Care [facility] [address] [place], [place] [address] [phone]

## 2021-01-16 NOTE — Telephone Encounter (Signed)
Left a detailed message for the patient about his CPAP information. I advised the patient that if he had any concerns or questions to give our office a call.

## 2021-01-18 ENCOUNTER — Other Ambulatory Visit: Payer: Self-pay | Admitting: Family Medicine

## 2021-01-18 MED ORDER — AMOXICILLIN-POT CLAVULANATE 875-125 MG PO TABS: 1.0000 | ORAL_TABLET | Freq: Two times a day (BID) | ORAL | 0 refills | Status: AC

## 2021-01-18 NOTE — Progress Notes (Signed)
Reviewed PDMP substance reporting system prior to prescribing opiate medications. No inconsistencies noted.  Meds ordered this encounter  Medications   amoxicillin-clavulanate (AUGMENTIN) 875-125 MG tablet    Sig: Take 1 tablet by mouth 2 (two) times daily for 10 days.    Dispense:  20 tablet    Refill:  0    Order Specific Question:   Supervising Provider    Answer:   Quentin Angst [[id number]]     Nolon Nations  APRN, MSN, FNP-C Patient Care [facility] [address] [place], [place] [address] [phone]

## 2021-01-20 ENCOUNTER — Other Ambulatory Visit: Payer: Self-pay | Admitting: Family Medicine

## 2021-01-20 DIAGNOSIS — N390 Urinary tract infection, site not specified: Secondary | ICD-10-CM

## 2021-01-20 LAB — URINE CULTURE

## 2021-01-20 MED ORDER — CIPROFLOXACIN HCL 500 MG PO TABS: 500.0000 mg | ORAL_TABLET | Freq: Two times a day (BID) | ORAL | 0 refills | Status: AC

## 2021-01-20 NOTE — Progress Notes (Signed)
Scott Buchanan is a 55 year old male with a medical history significant for type 2 diabetes mellitus, chronic sinusitis, hypertension, hyperlipidemia, and morbid obesity.  Reviewed urine culture, healed 100,000 colonies of staph epidermis.  We will treat with the following antibiotic:  Meds ordered this encounter  Medications   ciprofloxacin (CIPRO) 500 MG tablet    Sig: Take 1 tablet (500 mg total) by mouth 2 (two) times daily for 3 days.    Dispense:  6 tablet    Refill:  0    Order Specific Question:   Supervising Provider    Answer:   Quentin Angst [[id number]]     Nolon Nations  APRN, MSN, FNP-C Patient Care [facility] [address] [place], [place] [address] [phone]

## 2021-02-15 ENCOUNTER — Other Ambulatory Visit: Payer: Self-pay | Admitting: Family Medicine

## 2021-02-15 ENCOUNTER — Other Ambulatory Visit: Payer: Self-pay

## 2021-02-15 DIAGNOSIS — E1169 Type 2 diabetes mellitus with other specified complication: Secondary | ICD-10-CM

## 2021-02-15 DIAGNOSIS — E669 Obesity, unspecified: Secondary | ICD-10-CM

## 2021-02-17 ENCOUNTER — Other Ambulatory Visit: Payer: Self-pay

## 2021-02-17 DIAGNOSIS — E1169 Type 2 diabetes mellitus with other specified complication: Secondary | ICD-10-CM

## 2021-02-17 DIAGNOSIS — E669 Obesity, unspecified: Secondary | ICD-10-CM

## 2021-02-19 MED ORDER — TRULICITY 0.75 MG/0.5ML ~~LOC~~ SOAJ
0.7500 mg | SUBCUTANEOUS | 0 refills | Status: DC
Start: 2021-02-19 — End: 2021-03-18

## 2021-03-04 ENCOUNTER — Other Ambulatory Visit: Payer: Self-pay | Admitting: Family Medicine

## 2021-03-04 ENCOUNTER — Other Ambulatory Visit: Payer: Self-pay

## 2021-03-04 DIAGNOSIS — I10 Essential (primary) hypertension: Secondary | ICD-10-CM

## 2021-03-04 DIAGNOSIS — J3089 Other allergic rhinitis: Secondary | ICD-10-CM

## 2021-03-04 MED ORDER — MONTELUKAST SODIUM 10 MG PO TABS: 10.0000 mg | ORAL_TABLET | Freq: Every day | ORAL | 0 refills | Status: DC

## 2021-03-04 MED ORDER — AMLODIPINE BESYLATE 10 MG PO TABS: 10.0000 mg | ORAL_TABLET | Freq: Every day | ORAL | 0 refills | Status: DC

## 2021-03-17 NOTE — Progress Notes (Signed)
Follow Up Note  RE: Scott Buchanan MRN: [id number] DOB: [birth year] Date of Office Visit: 03/18/2021  Referring provider: Massie Maroon, FNP Primary care provider: Massie Maroon, FNP  Chief Complaint: Asthma (Uses his rescue throughout the day 1-2 times per day.  Trelegy in the morning helps until he gets down to the last 6 days. )  History of Present Illness: I had the pleasure of seeing Scott Buchanan for a follow up visit at the [facility] on 03/18/2021. He is a 55 y.o. male, who is being followed for asthma, allergic rhinoconjunctivitis and history of nasal polyp. His previous allergy office visit was on 10/30/2020 with Dr. Selena Batten. Today is a regular follow up visit.  Asthma Currently taking Trelegy 1 puff once a day and using albuterol 1-2 times a day due to wheezing from activities. Taking Singulair 10mg  daily  Denies any ER/urgent care visits or prednisone use since the last visit.  He states that the Trelegy doesn't seem to work as well once he is on his last 5 puffs. This happened with 2 of his last inhalers.   Environmental allergies Currently taking Xyzal daily and Flonase 2 sprays per nostril once a day. No nosebleeds. Using azelastine only as needed.  Symptoms stable since no pollen outdoors.   Assessment and Plan: [name] is a 55 y.o. male with: Asthma, not well controlled Past history - Diagnosed with asthma over 5 years ago but worsening symptoms the last 2 months. 2022 spirometry shows some restriction with 10% and greater than 200 cc improvement in FEV1 post bronchodilator treatment.  Clinically feeling improved.  Patient also used his own albuterol inhaler 2 hours before visit. Interim history - still using albuterol on a daily basis. Not sure if increased Trelegy made a difference - feels like inhaler doesn't work as well when only a few doses left. Today's spirometry showed some restriction. Daily controller medication(s): START  Breztri 2 puffs twice a day and rinse mouth after each use. Samples given.  Stop Trelegy. Let 2023 know if [place] is not covered.  Get bloodwork to see if qualify for biologics.  Continue Singulair (montelukast) 10mg  daily at night. May use albuterol rescue inhaler 2 puffs every 4 to 6 hours as needed for shortness of breath, chest tightness, coughing, and wheezing. May use albuterol rescue inhaler 2 puffs 5 to 15 minutes prior to strenuous physical activities. Monitor frequency of use.  Get spirometry at next visit.  Seasonal and perennial allergic rhinoconjunctivitis Past history - Perennial rhinoconjunctivitis symptoms for 20+ years with worsening in the spring and fall.  2022 skin testing showed: Positive to grass, ragweed, weed, mold, dust mites, cockroach. Interim history - symptoms stable with no pollen outdoors.  Continue environmental control measures as below. Use over the counter antihistamines such as Zyrtec (cetirizine), Claritin (loratadine), Allegra (fexofenadine), or Xyzal (levocetirizine) daily as needed. May take twice a day during allergy flares. May switch antihistamines every few months. Continue Singulair (montelukast) 10mg  daily at night. Use Flonase (fluticasone) nasal spray 1 spray per nostril twice a day as needed for nasal congestion.  Use azelastine nasal spray 1-2 sprays per nostril twice a day as needed for runny nose/drainage. Nasal saline spray (i.e., Simply Saline) or nasal saline lavage (i.e., NeilMed) is recommended as needed and prior to medicated nasal sprays. Consider allergy injections for long term control if above medications do not help the symptoms. Need asthma to be better controlled.   History of nasal  polyp 2 polypectomies in the past.   Return in about 2 months (around 05/18/2021).  Meds ordered this encounter  Medications   Budeson-Glycopyrrol-Formoterol (BREZTRI AEROSPHERE) 160-9-4.8 MCG/ACT AERO    Sig: Inhale 2 puffs into the lungs in the  morning and at bedtime.    Dispense:  10.7 g    Refill:  3    Lab Orders         IgE         CBC with Differential/Platelet     Diagnostics: Spirometry:  Tracings reviewed. His effort: Good reproducible efforts. FVC: 3.79L FEV1: 3.08L, 71% predicted FEV1/FVC ratio: 81% Interpretation: Spirometry consistent with possible restrictive disease.  Please see scanned spirometry results for details.   Medication List:  Current Outpatient Medications  Medication Sig Dispense Refill   acetaminophen (TYLENOL) 650 MG CR tablet Take 1,300 mg by mouth every 8 (eight) hours as needed for pain.     albuterol (VENTOLIN HFA) 108 (90 Base) MCG/ACT inhaler Inhale 2 puffs into the lungs every 4 (four) hours as needed for wheezing or shortness of breath. 18 g 3   amLODipine (NORVASC) 10 MG tablet Take 1 tablet (10 mg total) by mouth daily. 90 tablet 0   Ascorbic Acid (VITAMIN C) 1000 MG tablet Take 1,000 mg by mouth daily.     aspirin EC 81 MG tablet Take 81 mg by mouth daily.     azelastine (ASTELIN) 0.1 % nasal spray Place 1-2 sprays into both nostrils 2 (two) times daily as needed (nasal drainage). Use in each nostril as directed 30 mL 5   Budeson-Glycopyrrol-Formoterol (BREZTRI AEROSPHERE) 160-9-4.8 MCG/ACT AERO Inhale 2 puffs into the lungs in the morning and at bedtime. 10.7 g 3   cholecalciferol (VITAMIN D) 1000 units tablet Take 1,000 Units by mouth daily.     dicyclomine (BENTYL) 10 MG capsule Take 1 capsule (10 mg total) by mouth 4 (four) times daily -  before meals and at bedtime. (Patient taking differently: Take 10 mg by mouth 3 (three) times daily as needed for spasms.) 60 capsule 1   fluticasone (FLONASE) 50 MCG/ACT nasal spray Place 2 sprays into both nostrils at bedtime.     Fluticasone-Umeclidin-Vilant (TRELEGY ELLIPTA) 200-62.5-25 MCG/INH AEPB Inhale 1 puff into the lungs daily. Rinse mouth after each use. 60 each 5   gabapentin (NEURONTIN) 300 MG capsule Take 1 capsule by mouth at  bedtime 90 capsule 0   hydrochlorothiazide (HYDRODIURIL) 12.5 MG tablet Take 1 tablet (12.5 mg total) by mouth daily. 90 tablet 3   levocetirizine (XYZAL) 5 MG tablet Take 1 tablet (5 mg total) by mouth every evening. 90 tablet 1   metFORMIN (GLUCOPHAGE) 1000 MG tablet Take 1 tablet (1,000 mg total) by mouth 2 (two) times daily with a meal. 180 tablet 1   metoprolol succinate (TOPROL-XL) 50 MG 24 hr tablet TAKE 1 TABLET BY MOUTH ONCE DAILY(TAKE WITH OR IMMEDIATELY FOLLOWING A MEAL) 90 tablet 3   montelukast (SINGULAIR) 10 MG tablet Take 1 tablet (10 mg total) by mouth at bedtime. 90 tablet 0   omeprazole (PRILOSEC) 40 MG capsule Take 1 capsule (40 mg total) by mouth daily. 30 capsule 3   oxybutynin (DITROPAN-XL) 10 MG 24 hr tablet Take 1 tablet (10 mg total) by mouth daily. 30 tablet 11   Probiotic Product (PROBIOTIC PO) Take 1 capsule by mouth daily.     tamsulosin (FLOMAX) 0.4 MG CAPS capsule Take 1 capsule (0.4 mg total) by mouth daily. 90 capsule 3  TRULICITY 0.75 MG/0.5ML SOPN INJECT 0.75MG  INTO THE SKIN ONCE A WEEK. 4 mL 0   valsartan (DIOVAN) 80 MG tablet Take 1 tablet (80 mg total) by mouth daily. (Patient taking differently: Take 80 mg by mouth every evening.) 90 tablet 3   atorvastatin (LIPITOR) 40 MG tablet Take 1 tablet (40 mg total) by mouth daily. 90 tablet 2   Current Facility-Administered Medications  Medication Dose Route Frequency Provider Last Rate Last Admin   sodium chloride flush (NS) 0.9 % injection 3 mL  3 mL Intravenous Q12H Azalee Course, PA       Allergies: No Known Allergies I reviewed his past medical history, social history, family history, and environmental history and no significant changes have been reported from his previous visit.  Review of Systems  Constitutional:  Negative for appetite change, chills, fever and unexpected weight change.  HENT:  Negative for congestion, postnasal drip and rhinorrhea.   Eyes:  Negative for itching.  Respiratory:  Positive  for cough, chest tightness, shortness of breath and wheezing.   Cardiovascular:  Negative for chest pain.  Gastrointestinal:  Negative for abdominal pain.  Genitourinary:  Negative for difficulty urinating.  Skin:  Negative for rash.  Allergic/Immunologic: Positive for environmental allergies. Negative for food allergies.  Neurological:  Negative for headaches.   Objective: BP (!) 142/84   Pulse 90   Temp 98.8 F (37.1 C)   Resp 18   Ht 6\' 4"  (1.93 m)   Wt (!) 324 lb 6.4 oz (147.1 kg)   SpO2 96%   BMI 39.49 kg/m  Body mass index is 39.49 kg/m. Physical Exam Vitals and nursing note reviewed.  Constitutional:      Appearance: Normal appearance. He is well-developed. He is obese.  HENT:     Head: Normocephalic and atraumatic.     Right Ear: Tympanic membrane and external ear normal.     Left Ear: Tympanic membrane and external ear normal.     Nose: Nose normal.     Mouth/Throat:     Mouth: Mucous membranes are moist.     Pharynx: Oropharynx is clear.  Eyes:     Conjunctiva/sclera: Conjunctivae normal.  Cardiovascular:     Rate and Rhythm: Normal rate and regular rhythm.     Heart sounds: Normal heart sounds. No murmur heard.   No friction rub. No gallop.  Pulmonary:     Effort: Pulmonary effort is normal.     Breath sounds: Normal breath sounds. No wheezing, rhonchi or rales.  Musculoskeletal:     Cervical back: Neck supple.  Skin:    General: Skin is warm.     Findings: No rash.  Neurological:     Mental Status: He is alert and oriented to person, place, and time.  Psychiatric:        Behavior: Behavior normal.  Previous notes and tests were reviewed. The plan was reviewed with the patient/family, and all questions/concerned were addressed.  It was my pleasure to see Angie today and participate in his care. Please feel free to contact me with any questions or concerns.  Sincerely,  Onalee Hua, DO Allergy & Immunology  [facility] office: [phone] [facility] office: [phone]

## 2021-03-18 ENCOUNTER — Other Ambulatory Visit: Payer: Self-pay

## 2021-03-18 ENCOUNTER — Ambulatory Visit (INDEPENDENT_AMBULATORY_CARE_PROVIDER_SITE_OTHER): Payer: 59 | Admitting: Allergy

## 2021-03-18 ENCOUNTER — Encounter: Payer: Self-pay | Admitting: Allergy

## 2021-03-18 VITALS — BP 142/84 | HR 90 | Temp 98.8°F | Resp 18 | Ht 76.0 in | Wt 324.4 lb

## 2021-03-18 DIAGNOSIS — J302 Other seasonal allergic rhinitis: Secondary | ICD-10-CM

## 2021-03-18 DIAGNOSIS — H1013 Acute atopic conjunctivitis, bilateral: Secondary | ICD-10-CM

## 2021-03-18 DIAGNOSIS — J3089 Other allergic rhinitis: Secondary | ICD-10-CM

## 2021-03-18 DIAGNOSIS — Z8709 Personal history of other diseases of the respiratory system: Secondary | ICD-10-CM | POA: Diagnosis not present

## 2021-03-18 DIAGNOSIS — H1010 Acute atopic conjunctivitis, unspecified eye: Secondary | ICD-10-CM

## 2021-03-18 DIAGNOSIS — J45909 Unspecified asthma, uncomplicated: Secondary | ICD-10-CM | POA: Diagnosis not present

## 2021-03-18 MED ORDER — BREZTRI AEROSPHERE 160-9-4.8 MCG/ACT IN AERO: 2.0000 | INHALATION_SPRAY | Freq: Two times a day (BID) | RESPIRATORY_TRACT | 3 refills | Status: DC

## 2021-03-18 NOTE — Assessment & Plan Note (Signed)
Past history - Perennial rhinoconjunctivitis symptoms for 20+ years with worsening in the spring and fall.  2022 skin testing showed: Positive to grass, ragweed, weed, mold, dust mites, cockroach. Interim history - symptoms stable with no pollen outdoors.   Continue environmental control measures as below.  Use over the counter antihistamines such as Zyrtec (cetirizine), Claritin (loratadine), Allegra (fexofenadine), or Xyzal (levocetirizine) daily as needed. May take twice a day during allergy flares. May switch antihistamines every few months.  Continue Singulair (montelukast) 10mg  daily at night.  Use Flonase (fluticasone) nasal spray 1 spray per nostril twice a day as needed for nasal congestion.   Use azelastine nasal spray 1-2 sprays per nostril twice a day as needed for runny nose/drainage.  Nasal saline spray (i.e., Simply Saline) or nasal saline lavage (i.e., NeilMed) is recommended as needed and prior to medicated nasal sprays.  Consider allergy injections for long term control if above medications do not help the symptoms. Need asthma to be better controlled.

## 2021-03-18 NOTE — Assessment & Plan Note (Signed)
Past history - Diagnosed with asthma over 5 years ago but worsening symptoms the last 2 months. 2022 spirometry shows some restriction with 10% and greater than 200 cc improvement in FEV1 post bronchodilator treatment.  Clinically feeling improved.  Patient also used his own albuterol inhaler 2 hours before visit. Interim history - still using albuterol on a daily basis. Not sure if increased Trelegy made a difference - feels like inhaler doesn't work as well when only a few doses left.  Today's spirometry showed some restriction. . Daily controller medication(s): START Breztri 2 puffs twice a day and rinse mouth after each use. Samples given.  . Stop Trelegy. o Let us know if Markus Daft is not covered.  . Get bloodwork to see if qualify for biologics.  . Continue Singulair (montelukast) 10mg  daily at night. . May use albuterol rescue inhaler 2 puffs every 4 to 6 hours as needed for shortness of breath, chest tightness, coughing, and wheezing. May use albuterol rescue inhaler 2 puffs 5 to 15 minutes prior to strenuous physical activities. Monitor frequency of use.   Get spirometry at next visit.

## 2021-03-18 NOTE — Patient Instructions (Addendum)
Asthma: Daily controller medication(s): START Breztri 2 puffs twice a day and rinse mouth after each use. Samples given.  Stop Trelegy. Let us know if Markus Daft is not covered.  Get bloodwork We are ordering labs, so please allow 1-2 weeks for the results to come back. With the newly implemented Cures Act, the labs might be visible to you at the same time that they become visible to me. However, I will not address the results until all of the results are back, so please be patient.  In the meantime, continue recommendations in your patient instructions, including avoidance measures (if applicable), until you hear from me.  Continue Singulair (montelukast) 10mg  daily at night. May use albuterol rescue inhaler 2 puffs every 4 to 6 hours as needed for shortness of breath, chest tightness, coughing, and wheezing. May use albuterol rescue inhaler 2 puffs 5 to 15 minutes prior to strenuous physical activities. Monitor frequency of use.  Asthma control goals:  Full participation in all desired activities (may need albuterol before activity) Albuterol use two times or less a week on average (not counting use with activity) Cough interfering with sleep two times or less a month Oral steroids no more than once a year No hospitalizations  Environmental allergies 2022 skin testing showed: Positive to grass, ragweed, weed, mold, dust mites, cockroach. Continue environmental control measures as below. Use over the counter antihistamines such as Zyrtec (cetirizine), Claritin (loratadine), Allegra (fexofenadine), or Xyzal (levocetirizine) daily as needed. May take twice a day during allergy flares. May switch antihistamines every few months. Continue Singulair (montelukast) 10mg  daily at night. Use Flonase (fluticasone) nasal spray 1 spray per nostril twice a day as needed for nasal congestion.  Use azelastine nasal spray 1-2 sprays per nostril twice a day as needed for runny nose/drainage. Nasal saline spray  (i.e., Simply Saline) or nasal saline lavage (i.e., NeilMed) is recommended as needed and prior to medicated nasal sprays.  Consider allergy injections for long term control if above medications do not help the symptoms. Need asthma to be better controlled.   Follow up in 2 months or sooner if needed.   Reducing Pollen Exposure Pollen seasons: trees (spring), grass (summer) and ragweed/weeds (fall). Keep windows closed in your home and car to lower pollen exposure.  Install air conditioning in the bedroom and throughout the house if possible.  Avoid going out in dry windy days - especially early morning. Pollen counts are highest between 5 - 10 AM and on dry, hot and windy days.  Save outside activities for late afternoon or after a heavy rain, when pollen levels are lower.  Avoid mowing of grass if you have grass pollen allergy. Be aware that pollen can also be transported indoors on people and pets.  Dry your clothes in an automatic dryer rather than hanging them outside where they might collect pollen.  Rinse hair and eyes before bedtime.  Mold Control Mold and fungi can grow on a variety of surfaces provided certain temperature and moisture conditions exist.  Outdoor molds grow on plants, decaying vegetation and soil. The major outdoor mold, Alternaria and Cladosporium, are found in very high numbers during hot and dry conditions. Generally, a late summer - fall peak is seen for common outdoor fungal spores. Rain will temporarily lower outdoor mold spore count, but counts rise rapidly when the rainy period ends. The most important indoor molds are Aspergillus and Penicillium. Dark, humid and poorly ventilated basements are ideal sites for mold growth. The next most common  sites of mold growth are the bathroom and the kitchen. Outdoor (Seasonal) Mold Control Use air conditioning and keep windows closed. Avoid exposure to decaying vegetation. Avoid leaf raking. Avoid grain  handling. Consider wearing a face mask if working in moldy areas.  Indoor (Perennial) Mold Control  Maintain humidity below 50%. Get rid of mold growth on hard surfaces with water, detergent and, if necessary, 5% bleach (do not mix with other cleaners). Then dry the area completely. If mold covers an area more than 10 square feet, consider hiring an indoor environmental professional. For clothing, washing with soap and water is best. If moldy items cannot be cleaned and dried, throw them away. Remove sources e.g. contaminated carpets. Repair and seal leaking roofs or pipes. Using dehumidifiers in damp basements may be helpful, but empty the water and clean units regularly to prevent mildew from forming. All rooms, especially basements, bathrooms and kitchens, require ventilation and cleaning to deter mold and mildew growth. Avoid carpeting on concrete or damp floors, and storing items in damp areas. Control of House Dust Mite Allergen Dust mite allergens are a common trigger of allergy and asthma symptoms. While they can be found throughout the house, these microscopic creatures thrive in warm, humid environments such as bedding, upholstered furniture and carpeting. Because so much time is spent in the bedroom, it is essential to reduce mite levels there.  Encase pillows, mattresses, and box springs in special allergen-proof fabric covers or airtight, zippered plastic covers.  Bedding should be washed weekly in hot water (130 F) and dried in a hot dryer. Allergen-proof covers are available for comforters and pillows that can't be regularly washed.  Wash the allergy-proof covers every few months. Minimize clutter in the bedroom. Keep pets out of the bedroom.  Keep humidity less than 50% by using a dehumidifier or air conditioning. You can buy a humidity measuring device called a hygrometer to monitor this.  If possible, replace carpets with hardwood, linoleum, or washable area rugs. If that's not  possible, vacuum frequently with a vacuum that has a HEPA filter. Remove all upholstered furniture and non-washable window drapes from the bedroom. Remove all non-washable stuffed toys from the bedroom.  Wash stuffed toys weekly. Cockroach Allergen Avoidance Cockroaches are often found in the homes of densely populated urban areas, schools or commercial buildings, but these creatures can lurk almost anywhere. This does not mean that you have a dirty house or living area. Block all areas where roaches can enter the home. This includes crevices, wall cracks and windows.  Cockroaches need water to survive, so fix and seal all leaky faucets and pipes. Have an exterminator go through the house when your family and pets are gone to eliminate any remaining roaches. Keep food in lidded containers and put pet food dishes away after your pets are done eating. Vacuum and sweep the floor after meals, and take out garbage and recyclables. Use lidded garbage containers in the kitchen. Wash dishes immediately after use and clean under stoves, refrigerators or toasters where crumbs can accumulate. Wipe off the stove and other kitchen surfaces and cupboards regularly.

## 2021-03-18 NOTE — Assessment & Plan Note (Addendum)
   2 polypectomies in the past.

## 2021-03-19 ENCOUNTER — Other Ambulatory Visit: Payer: Self-pay

## 2021-03-19 ENCOUNTER — Encounter: Payer: Self-pay | Admitting: Allergy

## 2021-03-19 ENCOUNTER — Other Ambulatory Visit: Payer: Self-pay | Admitting: Family Medicine

## 2021-03-19 MED ORDER — FLUTICASONE-SALMETEROL 500-50 MCG/ACT IN AEPB: 1.0000 | INHALATION_SPRAY | Freq: Two times a day (BID) | RESPIRATORY_TRACT | 5 refills | Status: DC

## 2021-03-19 MED ORDER — SPIRIVA RESPIMAT 1.25 MCG/ACT IN AERS: 2.0000 | INHALATION_SPRAY | Freq: Every day | RESPIRATORY_TRACT | 5 refills | Status: DC

## 2021-03-20 LAB — IGE: IgE (Immunoglobulin E), Serum: 680 IU/mL — ABNORMAL HIGH (ref 6–495)

## 2021-03-20 LAB — CBC WITH DIFFERENTIAL/PLATELET
Basophils Absolute: 0.1 10*3/uL (ref 0.0–0.2)
Basos: 1 %
EOS (ABSOLUTE): 0.2 10*3/uL (ref 0.0–0.4)
Eos: 2 %
Hematocrit: 42.7 % (ref 37.5–51.0)
Hemoglobin: 14.3 g/dL (ref 13.0–17.7)
Immature Grans (Abs): 0 10*3/uL (ref 0.0–0.1)
Immature Granulocytes: 0 %
Lymphocytes Absolute: 2.8 10*3/uL (ref 0.7–3.1)
Lymphs: 36 %
MCH: 28.5 pg (ref 26.6–33.0)
MCHC: 33.5 g/dL (ref 31.5–35.7)
MCV: 85 fL (ref 79–97)
Monocytes Absolute: 0.8 10*3/uL (ref 0.1–0.9)
Monocytes: 10 %
Neutrophils Absolute: 3.9 10*3/uL (ref 1.4–7.0)
Neutrophils: 51 %
Platelets: 397 10*3/uL (ref 150–450)
RBC: 5.02 x10E6/uL (ref 4.14–5.80)
RDW: 13.4 % (ref 11.6–15.4)
WBC: 7.7 10*3/uL (ref 3.4–10.8)

## 2021-03-25 ENCOUNTER — Telehealth: Payer: Self-pay | Admitting: *Deleted

## 2021-03-25 NOTE — Telephone Encounter (Signed)
Spoke to patient and advised approval, copay card and submit to [facility] pharmacy. Gave instrux for delivery, storage, dosing and appt for initial dose in clinic

## 2021-03-25 NOTE — Telephone Encounter (Signed)
Patient called to discuss Dupixent for his asthma. I explained process to patient and advised would be back in touch once approval done to advise further instructions

## 2021-03-25 NOTE — Telephone Encounter (Signed)
Noted  

## 2021-04-03 ENCOUNTER — Other Ambulatory Visit: Payer: Self-pay | Admitting: Family Medicine

## 2021-04-03 DIAGNOSIS — E1169 Type 2 diabetes mellitus with other specified complication: Secondary | ICD-10-CM

## 2021-04-03 DIAGNOSIS — E669 Obesity, unspecified: Secondary | ICD-10-CM

## 2021-04-04 ENCOUNTER — Ambulatory Visit: Payer: 59

## 2021-04-04 ENCOUNTER — Other Ambulatory Visit: Payer: Self-pay

## 2021-04-04 DIAGNOSIS — J4550 Severe persistent asthma, uncomplicated: Secondary | ICD-10-CM

## 2021-04-04 NOTE — Progress Notes (Signed)
Immunotherapy   Patient Details  Name: Scott Buchanan MRN: [id number] Date of Birth: 1966-04-08  04/04/2021  [name] here to learn how to self administer Dupixent. Patient was instructed and shown how to self administer his Dupixent. Patient demonstrated correct administration and will continue to administer at home. Patient waited 30 minutes with no problems. Frequency: every 2 weeks Consent signed and patient instructions given.   [name] 04/04/2021, 8:29 AM

## 2021-04-05 ENCOUNTER — Other Ambulatory Visit: Payer: Self-pay | Admitting: Family Medicine

## 2021-04-05 DIAGNOSIS — E1169 Type 2 diabetes mellitus with other specified complication: Secondary | ICD-10-CM

## 2021-04-05 DIAGNOSIS — E669 Obesity, unspecified: Secondary | ICD-10-CM

## 2021-04-05 MED ORDER — TRULICITY 0.75 MG/0.5ML ~~LOC~~ SOAJ: SUBCUTANEOUS | 11 refills | Status: DC

## 2021-04-05 NOTE — Progress Notes (Signed)
Meds ordered this encounter  Medications   Dulaglutide (TRULICITY) 0.75 MG/0.5ML SOPN    Sig: INJECT 0.5ML (0.75MG ) INTO THE SKIN ONCE WEEKLY    Dispense:  4 mL    Refill:  11    Order Specific Question:   Supervising Provider    Answer:   Quentin Angst [[id number]]

## 2021-04-16 ENCOUNTER — Ambulatory Visit (INDEPENDENT_AMBULATORY_CARE_PROVIDER_SITE_OTHER): Payer: 59 | Admitting: Family Medicine

## 2021-04-16 ENCOUNTER — Encounter: Payer: Self-pay | Admitting: Family Medicine

## 2021-04-16 ENCOUNTER — Other Ambulatory Visit: Payer: Self-pay

## 2021-04-16 VITALS — BP 161/98 | Temp 98.0°F | Ht 76.0 in | Wt 331.2 lb

## 2021-04-16 DIAGNOSIS — Z23 Encounter for immunization: Secondary | ICD-10-CM | POA: Diagnosis not present

## 2021-04-16 DIAGNOSIS — R82998 Other abnormal findings in urine: Secondary | ICD-10-CM

## 2021-04-16 DIAGNOSIS — E889 Metabolic disorder, unspecified: Secondary | ICD-10-CM | POA: Diagnosis not present

## 2021-04-16 DIAGNOSIS — E1169 Type 2 diabetes mellitus with other specified complication: Secondary | ICD-10-CM | POA: Diagnosis not present

## 2021-04-16 DIAGNOSIS — E669 Obesity, unspecified: Secondary | ICD-10-CM

## 2021-04-16 DIAGNOSIS — G63 Polyneuropathy in diseases classified elsewhere: Secondary | ICD-10-CM

## 2021-04-16 LAB — POCT GLYCOSYLATED HEMOGLOBIN (HGB A1C)
HbA1c POC (<> result, manual entry): 6.3 % (ref 4.0–5.6)
HbA1c, POC (controlled diabetic range): 6.3 % (ref 0.0–7.0)
HbA1c, POC (prediabetic range): 6.3 % (ref 5.7–6.4)
Hemoglobin A1C: 6.3 % — AB (ref 4.0–5.6)

## 2021-04-16 LAB — POCT URINALYSIS DIP (CLINITEK)
Bilirubin, UA: NEGATIVE
Blood, UA: NEGATIVE
Glucose, UA: NEGATIVE mg/dL
Ketones, POC UA: NEGATIVE mg/dL
Nitrite, UA: NEGATIVE
POC PROTEIN,UA: 30 — AB
Spec Grav, UA: 1.025 (ref 1.010–1.025)
Urobilinogen, UA: 0.2 E.U./dL
pH, UA: 6 (ref 5.0–8.0)

## 2021-04-16 MED ORDER — GABAPENTIN 300 MG PO CAPS: 300.0000 mg | ORAL_CAPSULE | Freq: Every day | ORAL | 2 refills | Status: DC

## 2021-04-16 NOTE — Progress Notes (Signed)
Patient [facility]   Established Patient Office Visit  Subjective:  Patient ID: Scott Buchanan, male    DOB: 07-Apr-1966  Age: 55 y.o. MRN: [id number]  CC:  Chief Complaint  Patient presents with   Follow-up    Pt is here today for his 3 month follow up visit. No issues pt would like the flu vaccine also needs a refill on gabapentin    HPI Scott Buchanan is a very pleasant 55 year old male with a medical history significant for type 2 diabetes mellitus, hypertension, hyperlipidemia, environmental allergies, and moderate persistent asthma that presents for follow-up of chronic conditions.  Obstructive sleep apnea, patient says that he has been doing well and has minimal complaints on today.  He says that he has not been following a carbohydrate modified diet or exercising consistently over the holidays.  He says that he spent most of his holidays in [place] and has been eating high-fat foods.  He denies any headache, dizziness, polydipsia, polyuria, or polyphagia.  Patient continues to follow with podiatry.  He is up-to-date with eye exam. Patient also has a history of hypertension.  Blood pressure has been well controlled.  He does not check blood pressure at home.  He denies any shortness of breath, chest pain, or lower extremity swelling.  Patient has a history of moderate persistent asthma and is followed by allergy and asthma specialist for this problem.  Past Medical History:  Diagnosis Date   Arthritis    Asthma    Diabetes mellitus without complication ([place])    type 2   Hypertension    Primary localized osteoarthritis of right knee 03/02/2018    Past Surgical History:  Procedure Laterality Date   CYSTECTOMY     tailbone   ETHMOIDECTOMY Bilateral 04/07/2017   Procedure: BILATERAL ETHMOIDECTOMY AND SPHENOIDECTOMY;  Surgeon: Leta Baptist, MD;  Location: [facility];  Service: ENT;  Laterality: Bilateral;   KNEE ARTHROSCOPY W/  MENISCAL REPAIR     right and left knee one year apart   LEFT HEART CATH AND CORONARY ANGIOGRAPHY N/A 09/04/2020   Procedure: LEFT HEART CATH AND CORONARY ANGIOGRAPHY;  Surgeon: Martinique, Peter M, MD;  Location: [facility];  Service: Cardiovascular;  Laterality: N/A;   PARTIAL KNEE ARTHROPLASTY Right 03/02/2018   Procedure: UNICOMPARTMENTAL KNEE;  Surgeon: Marchia Bond, MD;  Location: [facility];  Service: Orthopedics;  Laterality: Right;  with block   SINUS ENDO WITH FUSION Bilateral 04/07/2017   Procedure: BILATERAL FRONTAL RECESS SINUS EXPLORATION WITH SINUS FUSION NAVIGATION;  Surgeon: Leta Baptist, MD;  Location: [facility];  Service: ENT;  Laterality: Bilateral;   SINUS EXPLORATION     TONSILLECTOMY     TURBINATE REDUCTION Bilateral 04/07/2017   Procedure: BILATERAL TURBINATE REDUCTION;  Surgeon: Leta Baptist, MD;  Location: [facility];  Service: ENT;  Laterality: Bilateral;   unicompartmental right knee      Family History  Problem Relation Age of Onset   Hypertension Mother    Colon cancer Neg Hx    Colon polyps Neg Hx    Stomach cancer Neg Hx    Rectal cancer Neg Hx    Esophageal cancer Neg Hx    Inflammatory bowel disease Neg Hx    Liver disease Neg Hx    Pancreatic cancer Neg Hx     Social History   Socioeconomic History   Marital status: Single    Spouse name: Not on file  Number of children: 1   Years of education: Not on file   Highest education level: Not on file  Occupational History   Occupation: maintance   Tobacco Use   Smoking status: Former    Types: Cigarettes   Smokeless tobacco: Never   Tobacco comments:    quit 10 years ago  Vaping Use   Vaping Use: Never used  Substance and Sexual Activity   Alcohol use: No   Drug use: No   Sexual activity: Yes  Other Topics Concern   Not on file  Social History Narrative   Not on file   Social Determinants of Health   Financial Resource Strain: Not on file  Food Insecurity:  Not on file  Transportation Needs: Not on file  Physical Activity: Not on file  Stress: Not on file  Social Connections: Not on file  Intimate Partner Violence: Not on file    Outpatient Medications Prior to Visit  Medication Sig Dispense Refill   acetaminophen (TYLENOL) 650 MG CR tablet Take 1,300 mg by mouth every 8 (eight) hours as needed for pain.     albuterol (VENTOLIN HFA) 108 (90 Base) MCG/ACT inhaler Inhale 2 puffs into the lungs every 4 (four) hours as needed for wheezing or shortness of breath. 18 g 3   amLODipine (NORVASC) 10 MG tablet Take 1 tablet (10 mg total) by mouth daily. 90 tablet 0   Ascorbic Acid (VITAMIN C) 1000 MG tablet Take 1,000 mg by mouth daily.     aspirin EC 81 MG tablet Take 81 mg by mouth daily.     atorvastatin (LIPITOR) 40 MG tablet Take 1 tablet (40 mg total) by mouth daily. 90 tablet 2   azelastine (ASTELIN) 0.1 % nasal spray Place 1-2 sprays into both nostrils 2 (two) times daily as needed (nasal drainage). Use in each nostril as directed 30 mL 5   Budeson-Glycopyrrol-Formoterol (BREZTRI AEROSPHERE) 160-9-4.8 MCG/ACT AERO Inhale 2 puffs into the lungs in the morning and at bedtime. 10.7 g 3   cholecalciferol (VITAMIN D) 1000 units tablet Take 1,000 Units by mouth daily.     dicyclomine (BENTYL) 10 MG capsule Take 1 capsule (10 mg total) by mouth 4 (four) times daily -  before meals and at bedtime. (Patient taking differently: Take 10 mg by mouth 3 (three) times daily as needed for spasms.) 60 capsule 1   Dulaglutide (TRULICITY) 9.51 OA/4.1YS SOPN INJECT 0.5ML (0.75MG) INTO THE SKIN ONCE WEEKLY 4 mL 11   dupilumab (DUPIXENT) 300 MG/2ML prefilled syringe Inject 300 mg into the skin once.     fluticasone (FLONASE) 50 MCG/ACT nasal spray Place 2 sprays into both nostrils at bedtime.     fluticasone-salmeterol (ADVAIR) 500-50 MCG/ACT AEPB Inhale 1 puff into the lungs in the morning and at bedtime. 60 each 5   Fluticasone-Umeclidin-Vilant (TRELEGY ELLIPTA)  200-62.5-25 MCG/INH AEPB Inhale 1 puff into the lungs daily. Rinse mouth after each use. 60 each 5   hydrochlorothiazide (HYDRODIURIL) 12.5 MG tablet Take 1 tablet (12.5 mg total) by mouth daily. 90 tablet 3   levocetirizine (XYZAL) 5 MG tablet Take 1 tablet (5 mg total) by mouth every evening. 90 tablet 1   metFORMIN (GLUCOPHAGE) 1000 MG tablet Take 1 tablet (1,000 mg total) by mouth 2 (two) times daily with a meal. 180 tablet 1   metoprolol succinate (TOPROL-XL) 50 MG 24 hr tablet TAKE 1 TABLET BY MOUTH ONCE DAILY(TAKE WITH OR IMMEDIATELY FOLLOWING A MEAL) 90 tablet 3   montelukast (SINGULAIR)  10 MG tablet Take 1 tablet (10 mg total) by mouth at bedtime. 90 tablet 0   omeprazole (PRILOSEC) 40 MG capsule Take 1 capsule (40 mg total) by mouth daily. 30 capsule 3   oxybutynin (DITROPAN-XL) 10 MG 24 hr tablet Take 1 tablet (10 mg total) by mouth daily. 30 tablet 11   Probiotic Product (PROBIOTIC PO) Take 1 capsule by mouth daily.     tamsulosin (FLOMAX) 0.4 MG CAPS capsule Take 1 capsule (0.4 mg total) by mouth daily. 90 capsule 3   Tiotropium Bromide Monohydrate (SPIRIVA RESPIMAT) 1.25 MCG/ACT AERS Inhale 2 puffs into the lungs daily. 4 g 5   valsartan (DIOVAN) 80 MG tablet Take 1 tablet (80 mg total) by mouth daily. (Patient taking differently: Take 80 mg by mouth every evening.) 90 tablet 3   gabapentin (NEURONTIN) 300 MG capsule Take 1 capsule by mouth at bedtime 90 capsule 0   sodium chloride flush (NS) 0.9 % injection 3 mL      No facility-administered medications prior to visit.    No Known Allergies  ROS Review of Systems  Constitutional: Negative.   HENT: Negative.    Respiratory: Negative.    Gastrointestinal: Negative.      Objective:    Physical Exam Constitutional:      Appearance: He is obese.  Eyes:     Pupils: Pupils are equal, round, and reactive to light.  Cardiovascular:     Rate and Rhythm: Normal rate and regular rhythm.     Pulses: Normal pulses.  Pulmonary:      Effort: Pulmonary effort is normal.  Abdominal:     General: Bowel sounds are normal.  Musculoskeletal:        General: Normal range of motion.  Skin:    General: Skin is warm.  Neurological:     General: No focal deficit present.     Mental Status: Mental status is at baseline.  Psychiatric:        Mood and Affect: Mood normal.        Thought Content: Thought content normal.        Judgment: Judgment normal.    BP (!) 161/98 (BP Location: Left Arm, Patient Position: Sitting)    Temp 98 F (36.7 C)    Ht _0  (1.93 m)    Wt (!) 331 lb 4 oz (150.3 kg)    SpO2 98%    BMI 40.32 kg/m  Wt Readings from Last 3 Encounters:  04/16/21 (!) 331 lb 4 oz (150.3 kg)  03/18/21 (!) 324 lb 6.4 oz (147.1 kg)  01/15/21 (!) 335 lb (152 kg)     Health Maintenance Due  Topic Date Due   Zoster Vaccines- Shingrix (1 of 2) Never done   Pneumococcal Vaccine 65-80 Years old (2 - PCV) 04/27/2018   COVID-19 Vaccine (3 - Booster for Pfizer series) 01/11/2020   FOOT EXAM  03/13/2021    There are no preventive care reminders to display for this patient.  Lab Results  Component Value Date   TSH 1.270 03/13/2020   Lab Results  Component Value Date   WBC 7.7 03/18/2021   HGB 14.3 03/18/2021   HCT 42.7 03/18/2021   MCV 85 03/18/2021   PLT 397 03/18/2021   Lab Results  Component Value Date   NA 143 01/15/2021   K 4.6 01/15/2021   CO2 26 01/15/2021   GLUCOSE 129 (H) 01/15/2021   BUN 14 01/15/2021   CREATININE 1.00 01/15/2021   BILITOT  0.5 01/15/2021   ALKPHOS 55 01/15/2021   AST 36 01/15/2021   ALT 61 (H) 01/15/2021   PROT 7.3 01/15/2021   ALBUMIN 5.0 (H) 01/15/2021   CALCIUM 10.6 (H) 01/15/2021   ANIONGAP 13 06/11/2020   EGFR 90 01/15/2021   Lab Results  Component Value Date   CHOL 185 10/09/2020   Lab Results  Component Value Date   HDL 33 (L) 10/09/2020   Lab Results  Component Value Date   LDLCALC 119 (H) 10/09/2020   Lab Results  Component Value Date   TRIG 188  (H) 10/09/2020   Lab Results  Component Value Date   CHOLHDL 5.6 (H) 10/09/2020   Lab Results  Component Value Date   HGBA1C 6.3 (A) 04/16/2021   HGBA1C 6.3 04/16/2021   HGBA1C 6.3 04/16/2021   HGBA1C 6.3 04/16/2021      Assessment & Plan:   Problem List Items Addressed This Visit   None Visit Diagnoses     Diabetes mellitus type 2 in obese ([place])    -  Primary   Relevant Orders   HgB A1c (Completed)   POCT URINALYSIS DIP (CLIN[facility]K)   Peripheral neuropathy due to metabolic disorder ([facility])       Relevant Medications   gabapentin (NEURONTIN) 300 MG capsule       Meds ordered this encounter  Medications   gabapentin (NEURONTIN) 300 MG capsule    Sig: Take 1 capsule (300 mg total) by mouth at bedtime.    Dispense:  90 capsule    Refill:  2    Order Specific Question:   Supervising Provider    Answer:   Angelica Chessman E [[id number]]   3. Diabetes mellitus type 2 in obese ([facility]) Patient's hemoglobin A1c is 6.3, which is improved from previous.  Following a carbohydrate diet at length.  Patient expressed understanding. - HgB A1c - POCT URINALYSIS DIP (CLINITEK) - CMP and Liver  2. Peripheral neuropathy due to metabolic disorder ([facility])  - gabapentin (NEURONTIN) 300 MG capsule; Take 1 capsule (300 mg total) by mouth at bedtime.  Dispense: 90 capsule; Refill: 2  3. Influenza vaccine needed  - Flu Vaccine QUAD 36+ mos IM (Fluarix, Fluzone & Afluria Quad PF  4. Leukocytes in urine Patient has a history of recurrent urinary tract infections.  Will follow urine culture. - Urine Culture  Follow-up: Follow up in 3 months for chronic conditions   Jayant Kriz Al Decant  APRN, MSN, FNP-C Patient [facility] [address] [place], [place] [address] [phone]

## 2021-04-16 NOTE — Patient Instructions (Signed)
Asthma Action Plan, Adult An asthma action plan helps you understand how to manage your asthma and what to do when you have an asthma attack. The action plan is a color-coded plan that lists the symptoms that indicate whether or not your condition is under control and what actions to take. If you have symptoms in the green zone, you are doing well. If you have symptoms in the yellow zone, you are having problems. If you have symptoms in the red zone, you need medical care right away. Follow the plan that you and your health care provider develop. Review your plan with your health care provider at each visit. What triggers your asthma? Knowing the things that can trigger an asthma attack or make your asthma symptoms worse is very important. Talk to your health care provider about your asthma triggers and how to avoid them. Record your known asthma triggers here: _______________ What is your personal best peak flow reading? If you use a peak flow meter, determine your personal best reading. Record it here: _______________ Green zone This zone means that your asthma is under control. You may not have any symptoms while you are in the green zone. This means that you: Have no coughing or wheezing, even while you are working or playing. Sleep through the night. Are breathing well. Have a peak flow reading that is above __________ (80% of your personal best or greater). If you are in the green zone, continue to manage your asthma as directed. Take these medicines every day: Controller medicine and dosage: _______________ Controller medicine and dosage: _______________ Controller medicine and dosage: _______________ Controller medicine and dosage: _______________ Before exercise, use this reliever or rescue medicine: _______________ Call your health care provider if you are using a reliever or rescue medicine more than 2-3 times a week. Yellow zone Symptoms in this zone mean that your condition may  be getting worse. You may have symptoms that interfere with exercise, are noticeably worse after exposure to triggers, or are worse at the first sign of a cold (upper respiratory infection). These may include: Waking from sleep. Coughing, especially at night or first thing in the morning. Mild wheezing. Chest tightness. A peak flow reading that is __________ to __________ (50-79% of your personal best). If you have any of these symptoms: Add the following medicine to the ones that you use daily: Reliever or rescue medicine and dosage: _______________ Additional medicine and dosage: _______________ Call your health care provider if: You remain in the yellow zone for __________ hours. You are using a reliever or rescue medicine more than 2-3 times a week. Red zone Symptoms in this zone mean that you should get medical help right away. You will likely feel distressed and have symptoms at rest that restrict your activity. You are in the red zone if: You are breathing hard and quickly. Your nose opens wide, your ribs show, and your neck muscles become visible when you breathe in. Your lips, fingers, or toes are a bluish color. You have trouble speaking in full sentences. Your peak flow reading is less than __________ (less than 50% of your personal best). Your symptoms do not improve within 15-20 minutes after you use your reliever or rescue medicine (bronchodilator). If you have any of these symptoms: These symptoms represent a serious problem that is an emergency. Do not wait to see if the symptoms will go away. Get medical help right away. Call your local emergency services (911 in the [place] Do not drive yourself   to the hospital. Use your reliever or rescue medicine. Start a nebulizer treatment or take 2-4 puffs from a metered-dose inhaler with a spacer. Repeat this action every 15-20 minutes until help arrives. Where to find more information You can find more information about asthma  from: Centers for Disease Control and Prevention: www.cdc.gov American Lung Association: www.lung.org This information is not intended to replace advice given to you by your health care provider. Make sure you discuss any questions you have with your health care provider. Document Revised: 05/31/2020 Document Reviewed: 05/31/2020 Elsevier Patient Education  2022 Elsevier Inc.  

## 2021-04-17 ENCOUNTER — Telehealth: Payer: Self-pay | Admitting: Family Medicine

## 2021-04-17 LAB — CMP AND LIVER
ALT: 52 IU/L — ABNORMAL HIGH (ref 0–44)
AST: 31 IU/L (ref 0–40)
Albumin: 4.6 g/dL (ref 3.8–4.9)
Alkaline Phosphatase: 58 IU/L (ref 44–121)
BUN: 21 mg/dL (ref 6–24)
Bilirubin Total: 0.3 mg/dL (ref 0.0–1.2)
Bilirubin, Direct: 0.12 mg/dL (ref 0.00–0.40)
CO2: 22 mmol/L (ref 20–29)
Calcium: 10 mg/dL (ref 8.7–10.2)
Chloride: 103 mmol/L (ref 96–106)
Creatinine, Ser: 1 mg/dL (ref 0.76–1.27)
Glucose: 136 mg/dL — ABNORMAL HIGH (ref 70–99)
Potassium: 4.4 mmol/L (ref 3.5–5.2)
Sodium: 144 mmol/L (ref 134–144)
Total Protein: 6.9 g/dL (ref 6.0–8.5)
eGFR: 90 mL/min/{1.73_m2} (ref >59)

## 2021-04-17 NOTE — Telephone Encounter (Signed)
Scott Buchanan is a 55 year old male with a medical history significant for type 2 diabetes mellitus, morbid obesity, hypertension, hyperlipidemia, moderate persistent asthma, and environmental allergies that was evaluated in clinic for chronic conditions on 04/16/2021. All laboratory values were reviewed, consistent with his baseline.  Liver enzymes continue to be slightly elevated.  More than likely related to metabolic syndrome.  Recommend the patient continues a low-fat, low carbohydrate diet divided over small meals throughout the day.  Also, low impact cardiovascular exercise for example a brisk walk around the block several times per week.  Follow-up in office as scheduled.  Nolon Nations  APRN, MSN, FNP-C Patient Care [facility] [address] [place], [place] [address] [phone]

## 2021-04-18 LAB — URINE CULTURE

## 2021-04-30 ENCOUNTER — Other Ambulatory Visit: Payer: Self-pay | Admitting: Gastroenterology

## 2021-05-11 IMAGING — DX DG CHEST 1V PORT
1 series · 1 of 1 positions shown · non-contrast
Comparison: December 03, 2017.

CLINICAL DATA: Shortness of breath.

EXAM:
PORTABLE CHEST 1 VIEW

[chest]
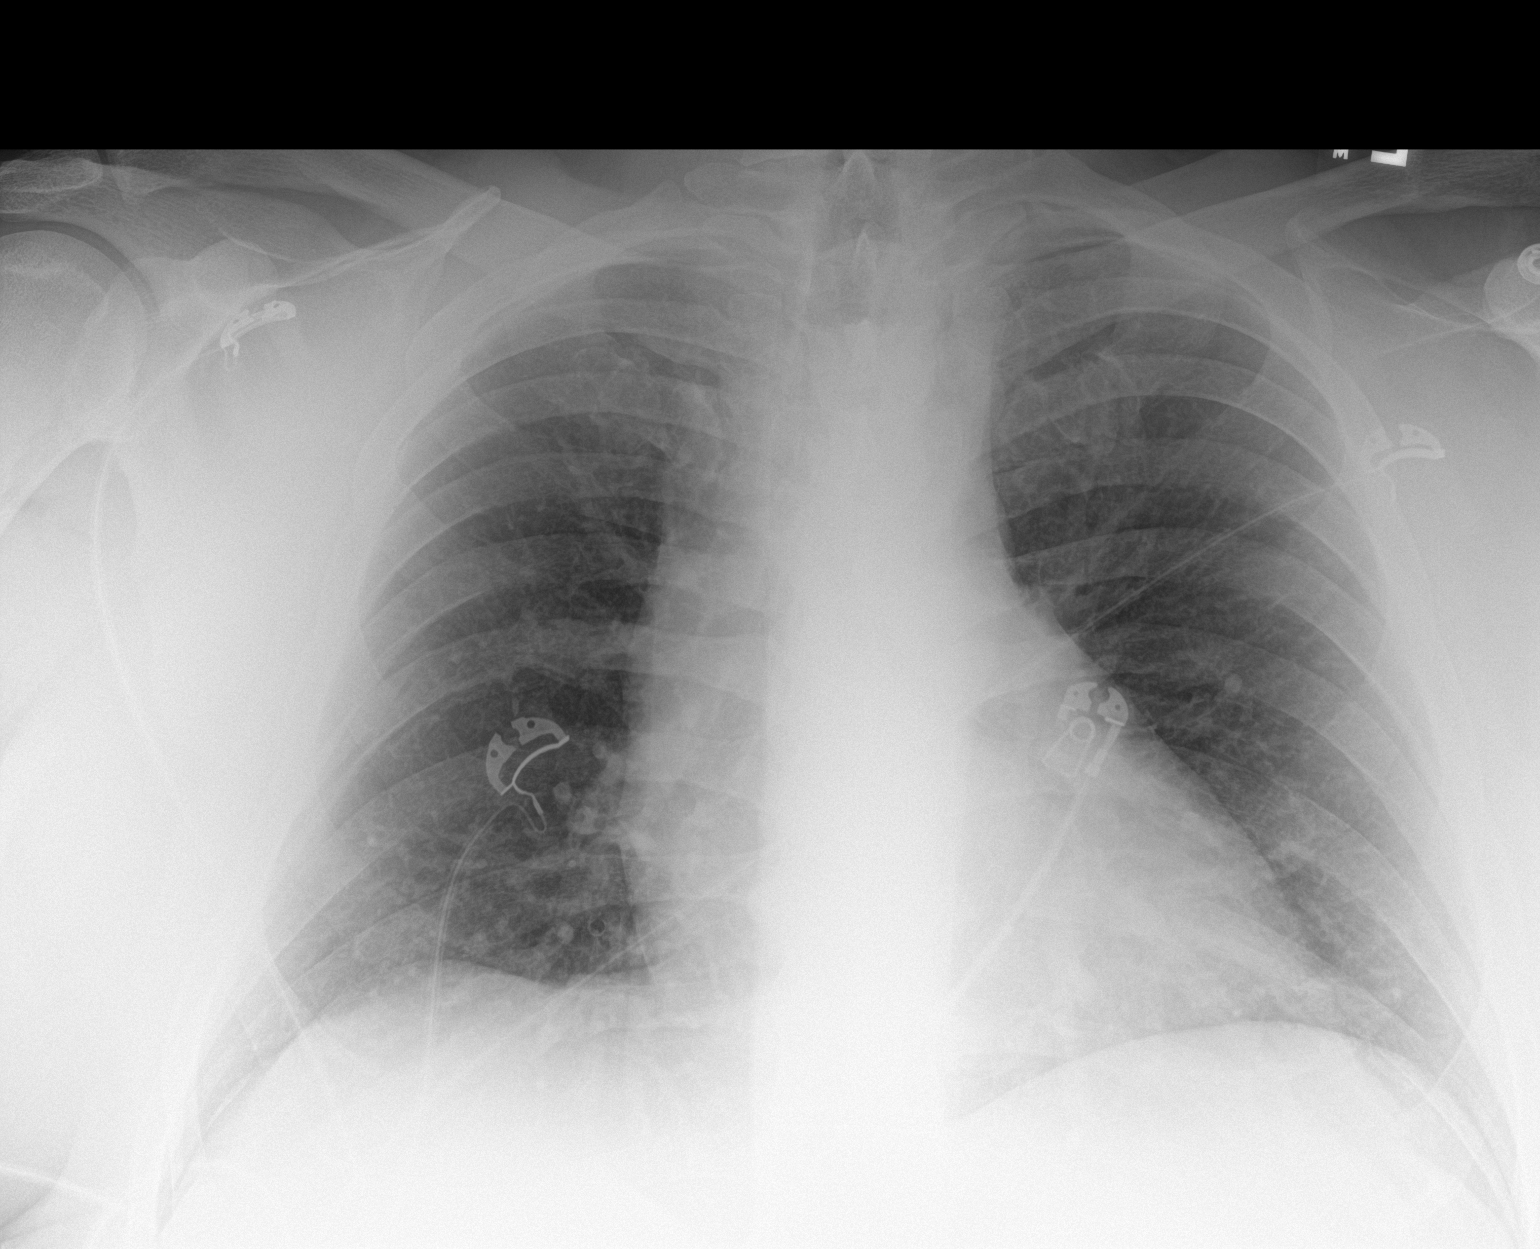

[1 of 1 positions shown; findings below may reference images not displayed]

FINDINGS: The heart size and mediastinal contours are within normal limits.
Both lungs are clear. No pneumothorax or pleural effusion is noted.
The visualized skeletal structures are unremarkable.
IMPRESSION: No active disease.

## 2021-05-15 NOTE — Progress Notes (Signed)
Follow Up Note  RE: Scott Buchanan MRN: [id number] DOB: 1965-05-22 Date of Office Visit: 05/16/2021  Referring provider: Massie Maroon, FNP Primary care provider: Massie Maroon, FNP  Chief Complaint: Asthma (Better //Dupixent injections doing good)  History of Present Illness: I had the pleasure of seeing Scott Buchanan for a follow up visit at the [facility] on 05/16/2021. He is a 56 y.o. male, who is being followed for asthma on Dupixent, allergic rhinoconjunctivitis and history of nasal polyps. His previous allergy office visit was on 03/18/2021 with Dr. Selena Batten. Today is a regular follow up visit.  Asthma Currently on Breztri 2 puffs twice a day with good benefit. Rinsing mouth after each use but has no spacer. He feels like his tongue is raw since starting this inhaler.  Able to get it for free.   Started on Dupixent in December and now doing it at home every 2 weeks. No issues with the injections.  No rescue inhaler use.  Denies any ER/urgent care visits or prednisone use since the last visit.  Taking Singulair at night.   Asthma is doing much better with above regimen.  Seasonal and perennial allergic rhinoconjunctivitis Asymptomatic with Singulair, Xyzal daily, Flonase 2 sprays per nostril once a day. No nosebleeds.   Assessment and Plan: Scott Buchanan is a 56 y.o. male with: Severe persistent asthma without complication Past history - Diagnosed with asthma over 5 years ago but worsening symptoms the last 2 months. 2022 spirometry shows some restriction with 10% and greater than 200 cc improvement in FEV1 post bronchodilator treatment.  Clinically feeling improved.  Patient also used his own albuterol inhaler 2 hours before visit. Interim history - doing much better with Dupixent (started Dec 2022) and Breztri.  Today's spirometry showed some restriction. Daily controller medication(s): continue Breztri 2 puffs twice a day with spacer and rinse mouth  afterwards. Spacer given and demonstrated proper use with inhaler. Patient understood technique and all questions/concerned were addressed.  Continue Dupixent injections at home every 2 weeks.  Continue Singulair (montelukast) 10mg  daily at night. May use albuterol rescue inhaler 2 puffs every 4 to 6 hours as needed for shortness of breath, chest tightness, coughing, and wheezing. May use albuterol rescue inhaler 2 puffs 5 to 15 minutes prior to strenuous physical activities. Monitor frequency of use.  Get spirometry at next visit.  Oral thrush Possible oral thrush. Take nystatin swish and swallow - 16mL four times a day.  Seasonal and perennial allergic rhinoconjunctivitis Past history - Perennial rhinoconjunctivitis symptoms for 20+ years with worsening in the spring and fall.  2022 skin testing showed: Positive to grass, ragweed, weed, mold, dust mites, cockroach. Interim history - symptoms controlled.  Continue environmental control measures as below. Use over the counter antihistamines such as Zyrtec (cetirizine), Claritin (loratadine), Allegra (fexofenadine), or Xyzal (levocetirizine) daily as needed. May take twice a day during allergy flares. May switch antihistamines every few months. Continue Singulair (montelukast) 10mg  daily at night. Use Flonase (fluticasone) nasal spray 1 spray per nostril twice a day as needed for nasal congestion.  Use azelastine nasal spray 1-2 sprays per nostril twice a day as needed for runny nose/drainage. Nasal saline spray (i.e., Simply Saline) or nasal saline lavage (i.e., NeilMed) is recommended as needed and prior to medicated nasal sprays. Consider allergy injections for long term control if above medications do not help the symptoms.   History of nasal polyp Past history - s/p 2 polypectomies.  Return in  about 3 months (around 08/14/2021).  Meds ordered this encounter  Medications   nystatin (MYCOSTATIN) 100000 UNIT/ML suspension    Sig: Take 5  mLs (500,000 Units total) by mouth 4 (four) times daily.    Dispense:  60 mL    Refill:  0   Lab Orders  No laboratory test(s) ordered today    Diagnostics: Spirometry:  Tracings reviewed. His effort: Good reproducible efforts. FVC: 4.44L FEV1: 3.62L, 83% predicted FEV1/FVC ratio: 82% Interpretation: Spirometry consistent with possible restrictive disease.  Please see scanned spirometry results for details.  Medication List:  Current Outpatient Medications  Medication Sig Dispense Refill   acetaminophen (TYLENOL) 650 MG CR tablet Take 1,300 mg by mouth every 8 (eight) hours as needed for pain.     albuterol (VENTOLIN HFA) 108 (90 Base) MCG/ACT inhaler Inhale 2 puffs into the lungs every 4 (four) hours as needed for wheezing or shortness of breath. 18 g 3   amLODipine (NORVASC) 10 MG tablet Take 1 tablet (10 mg total) by mouth daily. 90 tablet 0   Ascorbic Acid (VITAMIN C) 1000 MG tablet Take 1,000 mg by mouth daily.     aspirin EC 81 MG tablet Take 81 mg by mouth daily.     atorvastatin (LIPITOR) 40 MG tablet Take 1 tablet (40 mg total) by mouth daily. 90 tablet 2   azelastine (ASTELIN) 0.1 % nasal spray Place 1-2 sprays into both nostrils 2 (two) times daily as needed (nasal drainage). Use in each nostril as directed 30 mL 5   Budeson-Glycopyrrol-Formoterol (BREZTRI AEROSPHERE) 160-9-4.8 MCG/ACT AERO Inhale 2 puffs into the lungs in the morning and at bedtime. 10.7 g 3   cholecalciferol (VITAMIN D) 1000 units tablet Take 1,000 Units by mouth daily.     Dulaglutide (TRULICITY) 0.75 MG/0.5ML SOPN INJECT 0.5ML (0.75MG ) INTO THE SKIN ONCE WEEKLY 4 mL 11   dupilumab (DUPIXENT) 300 MG/2ML prefilled syringe Inject 300 mg into the skin once.     fluticasone (FLONASE) 50 MCG/ACT nasal spray Place 2 sprays into both nostrils at bedtime.     gabapentin (NEURONTIN) 300 MG capsule Take 1 capsule (300 mg total) by mouth at bedtime. 90 capsule 2   hydrochlorothiazide (HYDRODIURIL) 12.5 MG tablet  Take 1 tablet (12.5 mg total) by mouth daily. 90 tablet 3   levocetirizine (XYZAL) 5 MG tablet Take 1 tablet (5 mg total) by mouth every evening. 90 tablet 1   metFORMIN (GLUCOPHAGE) 1000 MG tablet Take 1 tablet (1,000 mg total) by mouth 2 (two) times daily with a meal. 180 tablet 1   metoprolol succinate (TOPROL-XL) 50 MG 24 hr tablet TAKE 1 TABLET BY MOUTH ONCE DAILY(TAKE WITH OR IMMEDIATELY FOLLOWING A MEAL) 90 tablet 3   montelukast (SINGULAIR) 10 MG tablet Take 1 tablet (10 mg total) by mouth at bedtime. 90 tablet 0   nystatin (MYCOSTATIN) 100000 UNIT/ML suspension Take 5 mLs (500,000 Units total) by mouth 4 (four) times daily. 60 mL 0   omeprazole (PRILOSEC) 40 MG capsule Take 1 capsule by mouth once daily 30 capsule 0   oxybutynin (DITROPAN-XL) 10 MG 24 hr tablet Take 1 tablet (10 mg total) by mouth daily. 30 tablet 11   tamsulosin (FLOMAX) 0.4 MG CAPS capsule Take 1 capsule (0.4 mg total) by mouth daily. 90 capsule 3   valsartan (DIOVAN) 80 MG tablet Take 1 tablet (80 mg total) by mouth daily. (Patient taking differently: Take 80 mg by mouth every evening.) 90 tablet 3   dicyclomine (BENTYL) 10  MG capsule Take 1 capsule (10 mg total) by mouth 4 (four) times daily -  before meals and at bedtime. (Patient not taking: Reported on 05/16/2021) 60 capsule 1   No current facility-administered medications for this visit.   Allergies: No Known Allergies I reviewed his past medical history, social history, family history, and environmental history and no significant changes have been reported from his previous visit.  Review of Systems  Constitutional:  Negative for appetite change, chills, fever and unexpected weight change.  HENT:  Negative for congestion, postnasal drip and rhinorrhea.   Eyes:  Negative for itching.  Respiratory:  Negative for cough, chest tightness, shortness of breath and wheezing.   Cardiovascular:  Negative for chest pain.  Gastrointestinal:  Negative for abdominal pain.   Genitourinary:  Negative for difficulty urinating.  Skin:  Negative for rash.  Allergic/Immunologic: Positive for environmental allergies. Negative for food allergies.  Neurological:  Negative for headaches.   Objective: BP 130/72    Pulse 90    Temp 97.9 F (36.6 C) (Temporal)    Resp 18    SpO2 96%  There is no height or weight on file to calculate BMI. Physical Exam Vitals and nursing note reviewed.  Constitutional:      Appearance: Normal appearance. He is well-developed. He is obese.  HENT:     Head: Normocephalic and atraumatic.     Right Ear: Tympanic membrane and external ear normal.     Left Ear: Tympanic membrane and external ear normal.     Nose: Nose normal.     Mouth/Throat:     Mouth: Mucous membranes are moist.     Pharynx: Oropharynx is clear.  Eyes:     Conjunctiva/sclera: Conjunctivae normal.  Cardiovascular:     Rate and Rhythm: Normal rate and regular rhythm.     Heart sounds: Normal heart sounds. No murmur heard.   No friction rub. No gallop.  Pulmonary:     Effort: Pulmonary effort is normal.     Breath sounds: Normal breath sounds. No wheezing, rhonchi or rales.  Musculoskeletal:     Cervical back: Neck supple.  Skin:    General: Skin is warm.     Findings: No rash.  Neurological:     Mental Status: He is alert and oriented to person, place, and time.  Psychiatric:        Behavior: Behavior normal.  Previous notes and tests were reviewed. The plan was reviewed with the patient/family, and all questions/concerned were addressed.  It was my pleasure to see Scott HuaDavid today and participate in his care. Please feel free to contact me with any questions or concerns.  Sincerely,  Wyline MoodYoon Mahsa Hanser, DO Allergy & Immunology  [facility] [place] office: [phone] [facility] office: [phone]

## 2021-05-16 ENCOUNTER — Encounter: Payer: Self-pay | Admitting: Allergy

## 2021-05-16 ENCOUNTER — Ambulatory Visit (INDEPENDENT_AMBULATORY_CARE_PROVIDER_SITE_OTHER): Payer: 59 | Admitting: Allergy

## 2021-05-16 ENCOUNTER — Other Ambulatory Visit: Payer: Self-pay

## 2021-05-16 VITALS — BP 130/72 | HR 90 | Temp 97.9°F | Resp 18

## 2021-05-16 DIAGNOSIS — J4550 Severe persistent asthma, uncomplicated: Secondary | ICD-10-CM | POA: Insufficient documentation

## 2021-05-16 DIAGNOSIS — H1013 Acute atopic conjunctivitis, bilateral: Secondary | ICD-10-CM

## 2021-05-16 DIAGNOSIS — B370 Candidal stomatitis: Secondary | ICD-10-CM | POA: Diagnosis not present

## 2021-05-16 DIAGNOSIS — Z8709 Personal history of other diseases of the respiratory system: Secondary | ICD-10-CM | POA: Diagnosis not present

## 2021-05-16 DIAGNOSIS — J302 Other seasonal allergic rhinitis: Secondary | ICD-10-CM | POA: Diagnosis not present

## 2021-05-16 HISTORY — DX: Severe persistent asthma, uncomplicated: J45.50

## 2021-05-16 MED ORDER — NYSTATIN 100000 UNIT/ML MT SUSP: 5.0000 mL | Freq: Four times a day (QID) | OROMUCOSAL | 0 refills | Status: DC

## 2021-05-16 NOTE — Patient Instructions (Addendum)
Asthma: Daily controller medication(s): continue Breztri 2 puffs twice a day with spacer and rinse mouth afterwards. Spacer given and demonstrated proper use with inhaler. Patient understood technique and all questions/concerned were addressed.   Continue Dupixent injections at home every 2 weeks.  Continue Singulair (montelukast) 10mg  daily at night. May use albuterol rescue inhaler 2 puffs every 4 to 6 hours as needed for shortness of breath, chest tightness, coughing, and wheezing. May use albuterol rescue inhaler 2 puffs 5 to 15 minutes prior to strenuous physical activities. Monitor frequency of use.  Asthma control goals:  Full participation in all desired activities (may need albuterol before activity) Albuterol use two times or less a week on average (not counting use with activity) Cough interfering with sleep two times or less a month Oral steroids no more than once a year No hospitalizations  Raw tongue - possible thrush Take nystatin swish and swallow - 16mL four times a day.  Environmental allergies 2022 skin testing showed: Positive to grass, ragweed, weed, mold, dust mites, cockroach. Continue environmental control measures as below. Use over the counter antihistamines such as Zyrtec (cetirizine), Claritin (loratadine), Allegra (fexofenadine), or Xyzal (levocetirizine) daily as needed. May take twice a day during allergy flares. May switch antihistamines every few months. Continue Singulair (montelukast) 10mg  daily at night. Use Flonase (fluticasone) nasal spray 1 spray per nostril twice a day as needed for nasal congestion.  Use azelastine nasal spray 1-2 sprays per nostril twice a day as needed for runny nose/drainage. Nasal saline spray (i.e., Simply Saline) or nasal saline lavage (i.e., NeilMed) is recommended as needed and prior to medicated nasal sprays.  Consider allergy injections for long term control if above medications do not help the symptoms.   Follow up in 3  months or sooner if needed.

## 2021-05-16 NOTE — Assessment & Plan Note (Signed)
Past history - Diagnosed with asthma over 5 years ago but worsening symptoms the last 2 months. 2022 spirometry shows some restriction with 10% and greater than 200 cc improvement in FEV1 post bronchodilator treatment.  Clinically feeling improved.  Patient also used his own albuterol inhaler 2 hours before visit. Interim history - doing much better with Dupixent (started Dec 2022) and Breztri.   Today's spirometry showed some restriction.  Daily controller medication(s): continue Breztri 2 puffs twice a day with spacer and rinse mouth afterwards.  Spacer given and demonstrated proper use with inhaler. Patient understood technique and all questions/concerned were addressed.   Continue Dupixent injections at home every 2 weeks.   Continue Singulair (montelukast) 10mg  daily at night.  May use albuterol rescue inhaler 2 puffs every 4 to 6 hours as needed for shortness of breath, chest tightness, coughing, and wheezing. May use albuterol rescue inhaler 2 puffs 5 to 15 minutes prior to strenuous physical activities. Monitor frequency of use.   Get spirometry at next visit.

## 2021-05-16 NOTE — Assessment & Plan Note (Signed)
Past history - s/p 2 polypectomies.

## 2021-05-16 NOTE — Assessment & Plan Note (Signed)
Possible oral thrush.  Take nystatin swish and swallow - 87mL four times a day.

## 2021-05-16 NOTE — Assessment & Plan Note (Signed)
Past history - Perennial rhinoconjunctivitis symptoms for 20+ years with worsening in the spring and fall.  2022 skin testing showed: Positive to grass, ragweed, weed, mold, dust mites, cockroach. Interim history - symptoms controlled.   Continue environmental control measures as below.  Use over the counter antihistamines such as Zyrtec (cetirizine), Claritin (loratadine), Allegra (fexofenadine), or Xyzal (levocetirizine) daily as needed. May take twice a day during allergy flares. May switch antihistamines every few months.  Continue Singulair (montelukast) 10mg  daily at night.  Use Flonase (fluticasone) nasal spray 1 spray per nostril twice a day as needed for nasal congestion.   Use azelastine nasal spray 1-2 sprays per nostril twice a day as needed for runny nose/drainage.  Nasal saline spray (i.e., Simply Saline) or nasal saline lavage (i.e., NeilMed) is recommended as needed and prior to medicated nasal sprays.  Consider allergy injections for long term control if above medications do not help the symptoms.

## 2021-06-02 ENCOUNTER — Other Ambulatory Visit: Payer: Self-pay | Admitting: Gastroenterology

## 2021-06-02 ENCOUNTER — Other Ambulatory Visit: Payer: Self-pay | Admitting: Family Medicine

## 2021-06-02 DIAGNOSIS — Z9109 Other allergy status, other than to drugs and biological substances: Secondary | ICD-10-CM

## 2021-06-02 DIAGNOSIS — J3089 Other allergic rhinitis: Secondary | ICD-10-CM

## 2021-06-02 DIAGNOSIS — I10 Essential (primary) hypertension: Secondary | ICD-10-CM

## 2021-06-04 ENCOUNTER — Other Ambulatory Visit: Payer: Self-pay

## 2021-06-04 ENCOUNTER — Other Ambulatory Visit: Payer: Self-pay | Admitting: Family Medicine

## 2021-06-04 DIAGNOSIS — J3089 Other allergic rhinitis: Secondary | ICD-10-CM

## 2021-06-04 DIAGNOSIS — I10 Essential (primary) hypertension: Secondary | ICD-10-CM

## 2021-06-04 DIAGNOSIS — Z9109 Other allergy status, other than to drugs and biological substances: Secondary | ICD-10-CM

## 2021-06-04 MED ORDER — MONTELUKAST SODIUM 10 MG PO TABS: 10.0000 mg | ORAL_TABLET | Freq: Every day | ORAL | 0 refills | Status: DC

## 2021-06-04 MED ORDER — LEVOCETIRIZINE DIHYDROCHLORIDE 5 MG PO TABS: 5.0000 mg | ORAL_TABLET | Freq: Every evening | ORAL | 1 refills | Status: DC

## 2021-06-04 MED ORDER — FLUTICASONE PROPIONATE 50 MCG/ACT NA SUSP: 2.0000 | Freq: Every day | NASAL | 5 refills | Status: DC

## 2021-06-04 MED ORDER — AMLODIPINE BESYLATE 10 MG PO TABS: 10.0000 mg | ORAL_TABLET | Freq: Every day | ORAL | 0 refills | Status: DC

## 2021-06-04 NOTE — Progress Notes (Signed)
Rx refills were sent to the pharmacy.

## 2021-07-01 ENCOUNTER — Other Ambulatory Visit: Payer: Self-pay | Admitting: Gastroenterology

## 2021-07-02 ENCOUNTER — Other Ambulatory Visit: Payer: Self-pay | Admitting: Cardiology

## 2021-07-02 DIAGNOSIS — E1169 Type 2 diabetes mellitus with other specified complication: Secondary | ICD-10-CM

## 2021-07-11 ENCOUNTER — Ambulatory Visit: Payer: PRIVATE HEALTH INSURANCE | Admitting: Urology

## 2021-07-16 ENCOUNTER — Ambulatory Visit: Payer: 59 | Admitting: Family Medicine

## 2021-07-18 ENCOUNTER — Ambulatory Visit (INDEPENDENT_AMBULATORY_CARE_PROVIDER_SITE_OTHER): Payer: 59 | Admitting: Family Medicine

## 2021-07-18 VITALS — BP 140/83 | HR 80 | Temp 98.2°F | Ht 76.0 in | Wt 330.0 lb

## 2021-07-18 DIAGNOSIS — I10 Essential (primary) hypertension: Secondary | ICD-10-CM

## 2021-07-18 DIAGNOSIS — E1169 Type 2 diabetes mellitus with other specified complication: Secondary | ICD-10-CM

## 2021-07-18 DIAGNOSIS — E669 Obesity, unspecified: Secondary | ICD-10-CM

## 2021-07-18 DIAGNOSIS — J4540 Moderate persistent asthma, uncomplicated: Secondary | ICD-10-CM | POA: Diagnosis not present

## 2021-07-18 LAB — POCT URINALYSIS DIPSTICK
Bilirubin, UA: NEGATIVE
Blood, UA: NEGATIVE
Glucose, UA: NEGATIVE
Ketones, UA: NEGATIVE
Nitrite, UA: POSITIVE
Protein, UA: NEGATIVE
Spec Grav, UA: 1.015 (ref 1.010–1.025)
Urobilinogen, UA: 0.2 E.U./dL
pH, UA: 5 (ref 5.0–8.0)

## 2021-07-18 LAB — POCT GLYCOSYLATED HEMOGLOBIN (HGB A1C)
HbA1c POC (<> result, manual entry): 6.6 % (ref 4.0–5.6)
HbA1c, POC (controlled diabetic range): 6.6 % (ref 0.0–7.0)
HbA1c, POC (prediabetic range): 6.6 % — AB (ref 5.7–6.4)
Hemoglobin A1C: 6.6 % — AB (ref 4.0–5.6)

## 2021-07-18 NOTE — Progress Notes (Signed)
?[facility]  ? ?Established Patient Office Visit ? ?Subjective:  ?Patient ID: Scott Buchanan, male    DOB: 11-07-1965  Age: 56 y.o. MRN: [id number] ? ?CC:  ?Chief Complaint  ?Patient presents with  ? Follow-up  ?  Pt is here for 3 month follow up.  ? ?Diabetes ?He presents for his follow-up diabetic visit. He has type 2 diabetes mellitus. No MedicAlert identification noted. His disease course has been improving. Pertinent negatives for hypoglycemia include no headaches or sweats. Pertinent negatives for diabetes include no blurred vision, no chest pain, no foot paresthesias, no foot ulcerations, no polydipsia, no polyphagia, no polyuria, no visual change, no weakness and no weight loss. Diabetic complications include peripheral neuropathy. Risk factors for coronary artery disease include hypertension. When asked about meal planning, he reported none. He participates in exercise daily. An ACE inhibitor/angiotensin II receptor blocker is being taken. Eye exam is current.  ?Hypertension ?This is a chronic problem. The problem is controlled. Pertinent negatives include no blurred vision, chest pain, headaches, malaise/fatigue, orthopnea, palpitations, peripheral edema, PND, shortness of breath or sweats. Risk factors for coronary artery disease include obesity, sedentary lifestyle and dyslipidemia. Compliance problems include diet.  There is no history of chronic renal disease.  ?Hyperlipidemia ?This is a chronic problem. The problem is controlled. Exacerbating diseases include obesity. He has no history of chronic renal disease, diabetes or liver disease. Pertinent negatives include no chest pain or shortness of breath.  ?Asthma ?There is no cough, difficulty breathing, frequent throat clearing or shortness of breath. This is a chronic problem. The problem has been unchanged. Pertinent negatives include no chest pain, headaches, malaise/fatigue, PND, sweats or weight  loss. He reports no improvement on treatment. His symptoms are not alleviated by beta-agonist. His past medical history is significant for asthma.  ? ? ?Past Medical History:  ?Diagnosis Date  ? Arthritis   ? Asthma   ? Diabetes mellitus without complication (HCC)   ? type 2  ? Hypertension   ? Primary localized osteoarthritis of right knee 03/02/2018  ? ? ?Past Surgical History:  ?Procedure Laterality Date  ? CYSTECTOMY    ? tailbone  ? ETHMOIDECTOMY Bilateral 04/07/2017  ? Procedure: BILATERAL ETHMOIDECTOMY AND SPHENOIDECTOMY;  Surgeon: Newman Pies, MD;  Location: [facility];  Service: ENT;  Laterality: Bilateral;  ? KNEE ARTHROSCOPY W/ MENISCAL REPAIR    ? right and left knee one year apart  ? LEFT HEART CATH AND CORONARY ANGIOGRAPHY N/A 09/04/2020  ? Procedure: LEFT HEART CATH AND CORONARY ANGIOGRAPHY;  Surgeon: Swaziland, Peter M, MD;  Location: [facility] IN[facility] CV [facility];  Service: Cardiovascular;  Laterality: N/A;  ? PARTIAL KNEE ARTHROPLASTY Right 03/02/2018  ? Procedure: UNICOMPARTMENTAL KNEE;  Surgeon: Teryl Lucy, MD;  Location: [facility];  Service: Orthopedics;  Laterality: Right;  with block  ? SINUS ENDO WITH FUSION Bilateral 04/07/2017  ? Procedure: BILATERAL FRONTAL RECESS SINUS EXPLORATION WITH SINUS FUSION NAVIGATION;  Surgeon: Newman Pies, MD;  Location: [facility];  Service: ENT;  Laterality: Bilateral;  ? SINUS EXPLORATION    ? TONSILLECTOMY    ? TURBINATE REDUCTION Bilateral 04/07/2017  ? Procedure: BILATERAL TURBINATE REDUCTION;  Surgeon: Newman Pies, MD;  Location: [facility];  Service: ENT;  Laterality: Bilateral;  ? unicompartmental right knee    ? ? ?Family History  ?Problem Relation Age of Onset  ? Hypertension Mother   ? Colon cancer Neg Hx   ?  Colon polyps Neg Hx   ? Stomach cancer Neg Hx   ? Rectal cancer Neg Hx   ? Esophageal cancer Neg Hx   ? Inflammatory bowel disease Neg Hx   ? Liver disease Neg Hx   ? Pancreatic cancer Neg Hx   ? ? ?Social History   ? ?Socioeconomic History  ? Marital status: Single  ?  Spouse name: Not on file  ? Number of children: 1  ? Years of education: Not on file  ? Highest education level: Not on file  ?Occupational History  ? Occupation: maintance   ?Tobacco Use  ? Smoking status: Former  ?  Types: Cigarettes  ? Smokeless tobacco: Never  ? Tobacco comments:  ?  quit 10 years ago  ?Vaping Use  ? Vaping Use: Never used  ?Substance and Sexual Activity  ? Alcohol use: No  ? Drug use: No  ? Sexual activity: Yes  ?Other Topics Concern  ? Not on file  ?Social History Narrative  ? Not on file  ? ?Social Determinants of Health  ? ?Financial Resource Strain: Not on file  ?Food Insecurity: Not on file  ?Transportation Needs: Not on file  ?Physical Activity: Not on file  ?Stress: Not on file  ?Social Connections: Not on file  ?Intimate Partner Violence: Not on file  ? ? ?Outpatient Medications Prior to Visit  ?Medication Sig Dispense Refill  ? acetaminophen (TYLENOL) 650 MG CR tablet Take 1,300 mg by mouth every 8 (eight) hours as needed for pain.    ? albuterol (VENTOLIN HFA) 108 (90 Base) MCG/ACT inhaler Inhale 2 puffs into the lungs every 4 (four) hours as needed for wheezing or shortness of breath. 18 g 3  ? amLODipine (NORVASC) 10 MG tablet Take 1 tablet (10 mg total) by mouth daily. 90 tablet 0  ? Ascorbic Acid (VITAMIN C) 1000 MG tablet Take 1,000 mg by mouth daily.    ? aspirin EC 81 MG tablet Take 81 mg by mouth daily.    ? atorvastatin (LIPITOR) 40 MG tablet Take 1 tablet (40 mg total) by mouth daily. 90 tablet 2  ? Budeson-Glycopyrrol-Formoterol (BREZTRI AEROSPHERE) 160-9-4.8 MCG/ACT AERO Inhale 2 puffs into the lungs in the morning and at bedtime. 10.7 g 3  ? cholecalciferol (VITAMIN D) 1000 units tablet Take 1,000 Units by mouth daily.    ? dicyclomine (BENTYL) 10 MG capsule Take 1 capsule (10 mg total) by mouth 4 (four) times daily -  before meals and at bedtime. (Patient not taking: Reported on 05/16/2021) 60 capsule 1  ?  Dulaglutide (TRULICITY) 0.75 MG/0.5ML SOPN INJECT 0.5ML (0.75MG ) INTO THE SKIN ONCE WEEKLY 4 mL 11  ? dupilumab (DUPIXENT) 300 MG/2ML prefilled syringe Inject 300 mg into the skin once.    ? fluticasone (FLONASE) 50 MCG/ACT nasal spray Place 2 sprays into both nostrils at bedtime. 11.1 mL 5  ? gabapentin (NEURONTIN) 300 MG capsule Take 1 capsule (300 mg total) by mouth at bedtime. 90 capsule 2  ? hydrochlorothiazide (HYDRODIURIL) 12.5 MG tablet Take 1 tablet (12.5 mg total) by mouth daily. 90 tablet 3  ? levocetirizine (XYZAL) 5 MG tablet Take 1 tablet (5 mg total) by mouth every evening. 90 tablet 1  ? metFORMIN (GLUCOPHAGE) 1000 MG tablet TAKE 1 TABLET BY MOUTH TWICE DAILY WITH A MEAL 30 tablet 1  ? metoprolol succinate (TOPROL-XL) 50 MG 24 hr tablet TAKE 1 TABLET BY MOUTH ONCE DAILY(TAKE WITH OR IMMEDIATELY FOLLOWING A MEAL) 90 tablet 3  ?  montelukast (SINGULAIR) 10 MG tablet Take 1 tablet (10 mg total) by mouth at bedtime. 90 tablet 0  ? nystatin (MYCOSTATIN) 100000 UNIT/ML suspension Take 5 mLs (500,000 Units total) by mouth 4 (four) times daily. 60 mL 0  ? omeprazole (PRILOSEC) 40 MG capsule TAKE 1 CAPSULE BY MOUTH ONCE DAILY . APPOINTMENT REQUIRED FOR FUTURE REFILLS 30 capsule 0  ? oxybutynin (DITROPAN-XL) 10 MG 24 hr tablet Take 1 tablet (10 mg total) by mouth daily. 30 tablet 11  ? tamsulosin (FLOMAX) 0.4 MG CAPS capsule Take 1 capsule (0.4 mg total) by mouth daily. 90 capsule 3  ? valsartan (DIOVAN) 80 MG tablet Take 1 tablet (80 mg total) by mouth daily. (Patient taking differently: Take 80 mg by mouth every evening.) 90 tablet 3  ? azelastine (ASTELIN) 0.1 % nasal spray Place 1-2 sprays into both nostrils 2 (two) times daily as needed (nasal drainage). Use in each nostril as directed 30 mL 5  ? ?No facility-administered medications prior to visit.  ? ? ?No Known Allergies ? ?ROS ?Review of Systems  ?Constitutional: Negative.  Negative for malaise/fatigue and weight loss.  ?HENT: Negative.    ?Eyes:   Negative for blurred vision.  ?Respiratory:  Negative for cough and shortness of breath.   ?Cardiovascular:  Negative for chest pain, palpitations, orthopnea and PND.  ?Gastrointestinal: Negative.   ?Endocrine: Negative for pol

## 2021-07-19 LAB — COMPREHENSIVE METABOLIC PANEL
ALT: 51 IU/L — ABNORMAL HIGH (ref 0–44)
AST: 36 IU/L (ref 0–40)
Albumin/Globulin Ratio: 1.9 (ref 1.2–2.2)
Albumin: 4.5 g/dL (ref 3.8–4.9)
Alkaline Phosphatase: 58 IU/L (ref 44–121)
BUN/Creatinine Ratio: 15 (ref 9–20)
BUN: 13 mg/dL (ref 6–24)
Bilirubin Total: 0.4 mg/dL (ref 0.0–1.2)
CO2: 23 mmol/L (ref 20–29)
Calcium: 9.1 mg/dL (ref 8.7–10.2)
Chloride: 102 mmol/L (ref 96–106)
Creatinine, Ser: 0.89 mg/dL (ref 0.76–1.27)
Globulin, Total: 2.4 g/dL (ref 1.5–4.5)
Glucose: 87 mg/dL (ref 70–99)
Potassium: 4.1 mmol/L (ref 3.5–5.2)
Sodium: 139 mmol/L (ref 134–144)
Total Protein: 6.9 g/dL (ref 6.0–8.5)
eGFR: 102 mL/min/{1.73_m2} (ref >59)

## 2021-07-25 ENCOUNTER — Ambulatory Visit: Payer: 59 | Admitting: Urology

## 2021-07-25 ENCOUNTER — Encounter: Payer: Self-pay | Admitting: Urology

## 2021-07-25 VITALS — BP 164/91 | HR 90 | Ht 76.0 in | Wt 330.0 lb

## 2021-07-25 DIAGNOSIS — N3943 Post-void dribbling: Secondary | ICD-10-CM | POA: Diagnosis not present

## 2021-07-25 DIAGNOSIS — N401 Enlarged prostate with lower urinary tract symptoms: Secondary | ICD-10-CM

## 2021-07-25 DIAGNOSIS — R351 Nocturia: Secondary | ICD-10-CM

## 2021-07-25 LAB — BLADDER SCAN AMB NON-IMAGING: Scan Result: 94

## 2021-07-25 MED ORDER — MIRABEGRON ER 50 MG PO TB24: 50.0000 mg | ORAL_TABLET | Freq: Every day | ORAL | 0 refills | Status: DC

## 2021-07-25 MED ORDER — TAMSULOSIN HCL 0.4 MG PO CAPS: 0.4000 mg | ORAL_CAPSULE | Freq: Every day | ORAL | 3 refills | Status: DC

## 2021-07-25 NOTE — Progress Notes (Signed)
? ?07/25/2021 ?8:00 AM  ? ?Scott Buchanan ?1965-05-19 ?[id number] ? ?Referring provider: Dorena Dew, FNP ?[address] ?[address] ?[place],  [place] [address] ? ?Chief Complaint  ?Patient presents with  ? Benign Prostatic Hypertrophy  ? ? ?HPI: ?56 y.o. male presents for follow-up of BPH with LUTS. ? ?Remains on tamsulosin 0.4 mg daily ?At last years visit had noted worsening distortedly voiding symptoms and nocturia ?Was given a trial of Gemtesa which was effective however this was not covered by his insurance and was cost prohibitive ?He was started on oxybutynin extended release which does not work as well ?IPSS 9/35 ?Denies dysuria, gross hematuria ?No flank, abdominal or pelvic pain ? ? ?PMH: ?Past Medical History:  ?Diagnosis Date  ? Arthritis   ? Asthma   ? Diabetes mellitus without complication ([place])   ? type 2  ? Hypertension   ? Primary localized osteoarthritis of right knee 03/02/2018  ? ? ?Surgical History: ?Past Surgical History:  ?Procedure Laterality Date  ? CYSTECTOMY    ? tailbone  ? ETHMOIDECTOMY Bilateral 04/07/2017  ? Procedure: BILATERAL ETHMOIDECTOMY AND SPHENOIDECTOMY;  Surgeon: Leta Baptist, MD;  Location: [facility];  Service: ENT;  Laterality: Bilateral;  ? KNEE ARTHROSCOPY W/ MENISCAL REPAIR    ? right and left knee one year apart  ? LEFT HEART CATH AND CORONARY ANGIOGRAPHY N/A 09/04/2020  ? Procedure: LEFT HEART CATH AND CORONARY ANGIOGRAPHY;  Surgeon: Martinique, Peter M, MD;  Location: [facility];  Service: Cardiovascular;  Laterality: N/A;  ? PARTIAL KNEE ARTHROPLASTY Right 03/02/2018  ? Procedure: UNICOMPARTMENTAL KNEE;  Surgeon: Marchia Bond, MD;  Location: [facility];  Service: Orthopedics;  Laterality: Right;  with block  ? SINUS ENDO WITH FUSION Bilateral 04/07/2017  ? Procedure: BILATERAL FRONTAL RECESS SINUS EXPLORATION WITH SINUS FUSION NAVIGATION;  Surgeon: Leta Baptist, MD;  Location: [facility];  Service: ENT;  Laterality: Bilateral;  ? SINUS  EXPLORATION    ? TONSILLECTOMY    ? TURBINATE REDUCTION Bilateral 04/07/2017  ? Procedure: BILATERAL TURBINATE REDUCTION;  Surgeon: Leta Baptist, MD;  Location: [facility];  Service: ENT;  Laterality: Bilateral;  ? unicompartmental right knee    ? ? ?Home Medications:  ?Allergies as of 07/25/2021   ?No Known Allergies ?  ? ?  ?Medication List  ?  ? ?  ? Accurate as of July 25, 2021  8:00 AM. If you have any questions, ask your nurse or doctor.  ?  ?  ? ?  ? ?acetaminophen 650 MG CR tablet ?Commonly known as: TYLENOL ?Take 1,300 mg by mouth every 8 (eight) hours as needed for pain. ?  ?albuterol 108 (90 Base) MCG/ACT inhaler ?Commonly known as: VENTOLIN HFA ?Inhale 2 puffs into the lungs every 4 (four) hours as needed for wheezing or shortness of breath. ?  ?amLODipine 10 MG tablet ?Commonly known as: NORVASC ?Take 1 tablet (10 mg total) by mouth daily. ?  ?aspirin EC 81 MG tablet ?Take 81 mg by mouth daily. ?  ?atorvastatin 40 MG tablet ?Commonly known as: LIPITOR ?Take 1 tablet (40 mg total) by mouth daily. ?  ?Breztri Aerosphere 160-9-4.8 MCG/ACT Aero ?Generic drug: Budeson-Glycopyrrol-Formoterol ?Inhale 2 puffs into the lungs in the morning and at bedtime. ?  ?cholecalciferol 1000 units tablet ?Commonly known as: VITAMIN D ?Take 1,000 Units by mouth daily. ?  ?Dupixent 300 MG/2ML prefilled syringe ?Generic drug: dupilumab ?Inject 300 mg into the skin once. ?  ?fluticasone  50 MCG/ACT nasal spray ?Commonly known as: FLONASE ?Place 2 sprays into both nostrils at bedtime. ?  ?gabapentin 300 MG capsule ?Commonly known as: NEURONTIN ?Take 1 capsule (300 mg total) by mouth at bedtime. ?  ?hydrochlorothiazide 12.5 MG tablet ?Commonly known as: HYDRODIURIL ?Take 1 tablet (12.5 mg total) by mouth daily. ?  ?levocetirizine 5 MG tablet ?Commonly known as: XYZAL ?Take 1 tablet (5 mg total) by mouth every evening. ?  ?metFORMIN 1000 MG tablet ?Commonly known as: GLUCOPHAGE ?TAKE 1 TABLET BY MOUTH TWICE DAILY WITH A  MEAL ?  ?metoprolol succinate 50 MG 24 hr tablet ?Commonly known as: TOPROL-XL ?TAKE 1 TABLET BY MOUTH ONCE DAILY(TAKE WITH OR IMMEDIATELY FOLLOWING A MEAL) ?  ?montelukast 10 MG tablet ?Commonly known as: SINGULAIR ?Take 1 tablet (10 mg total) by mouth at bedtime. ?  ?omeprazole 40 MG capsule ?Commonly known as: PRILOSEC ?TAKE 1 CAPSULE BY MOUTH ONCE DAILY . APPOINTMENT REQUIRED FOR FUTURE REFILLS ?  ?oxybutynin 10 MG 24 hr tablet ?Commonly known as: DITROPAN-XL ?Take 1 tablet (10 mg total) by mouth daily. ?  ?tamsulosin 0.4 MG Caps capsule ?Commonly known as: FLOMAX ?Take 1 capsule (0.4 mg total) by mouth daily. ?  ?Trulicity A999333 0000000 Sopn ?Generic drug: Dulaglutide ?INJECT 0.5ML (0.75MG ) INTO THE SKIN ONCE WEEKLY ?  ?valsartan 80 MG tablet ?Commonly known as: Diovan ?Take 1 tablet (80 mg total) by mouth daily. ?What changed: when to take this ?  ?vitamin C 1000 MG tablet ?Take 1,000 mg by mouth daily. ?  ? ?  ? ? ?Allergies: No Known Allergies ? ?Family History: ?Family History  ?Problem Relation Age of Onset  ? Hypertension Mother   ? Colon cancer Neg Hx   ? Colon polyps Neg Hx   ? Stomach cancer Neg Hx   ? Rectal cancer Neg Hx   ? Esophageal cancer Neg Hx   ? Inflammatory bowel disease Neg Hx   ? Liver disease Neg Hx   ? Pancreatic cancer Neg Hx   ? ? ?Social History:  reports that he has quit smoking. His smoking use included cigarettes. He has never used smokeless tobacco. He reports that he does not drink alcohol and does not use drugs. ? ? ?Physical Exam: ?BP (!) 164/91   Pulse 90   Ht 6\' 4"  (1.93 m)   Wt (!) 330 lb (149.7 kg)   BMI 40.17 kg/m?   ?Constitutional:  Alert and oriented, No acute distress. ?HEENT: [place] AT, moist mucus membranes.  Trachea midline, no masses. ?Cardiovascular: No clubbing, cyanosis, or edema. ?Respiratory: Normal respiratory effort, no increased work of breathing. ?Neurologic: Grossly intact, no focal deficits, moving all 4 extremities. ?Psychiatric: Normal mood and  affect. ? ? ?Assessment & Plan:   ? ?1.  BPH with postvoid dribbling ?Stable ?Bladder scan PVR 54 mL ?Oxybutynin not as effective as Gemtesa for storage related symptoms ?Trial Myrbetriq 50 mg-samples given and he will call back regarding efficacy ?Hold oxybutynin while on Myrbetriq ?Tamsulosin refilled ? ?2.  Nocturia ?Stable ? ?Continue annual follow-up or earlier as needed for worsening voiding symptoms ? ? ?Abbie Sons, MD ? ?[facility] ?[address], [address] ?[place], [place] [address] ?[phone] ? ?

## 2021-07-30 ENCOUNTER — Other Ambulatory Visit: Payer: Self-pay | Admitting: Family Medicine

## 2021-07-30 ENCOUNTER — Other Ambulatory Visit: Payer: Self-pay | Admitting: Physician Assistant

## 2021-07-30 DIAGNOSIS — I10 Essential (primary) hypertension: Secondary | ICD-10-CM

## 2021-08-05 ENCOUNTER — Other Ambulatory Visit: Payer: Self-pay | Admitting: Cardiology

## 2021-08-05 DIAGNOSIS — E669 Obesity, unspecified: Secondary | ICD-10-CM

## 2021-08-07 ENCOUNTER — Other Ambulatory Visit: Payer: Self-pay | Admitting: Family Medicine

## 2021-08-07 DIAGNOSIS — E669 Obesity, unspecified: Secondary | ICD-10-CM

## 2021-08-07 MED ORDER — METFORMIN HCL 1000 MG PO TABS: 1000.0000 mg | ORAL_TABLET | Freq: Two times a day (BID) | ORAL | 1 refills | Status: DC

## 2021-08-07 NOTE — Progress Notes (Signed)
Meds ordered this encounter  ?Medications  ? metFORMIN (GLUCOPHAGE) 1000 MG tablet  ?  Sig: Take 1 tablet (1,000 mg total) by mouth 2 (two) times daily with a meal.  ?  Dispense:  180 tablet  ?  Refill:  1  ?  PLEASE SCHEDULE AN APPT FOR FUTURE REFILLS  ?  Order Specific Question:   Supervising Provider  ?  AnswerQuentin Angst [[id number]]  ? Nolon Nations  APRN, MSN, FNP-C ?Patient Care Center ?[facility] ?[address]  ?[place], [place] [address] ?[phone] ? ?

## 2021-08-15 ENCOUNTER — Encounter: Payer: Self-pay | Admitting: Family Medicine

## 2021-08-20 ENCOUNTER — Encounter: Payer: Self-pay | Admitting: Allergy

## 2021-08-20 ENCOUNTER — Ambulatory Visit (INDEPENDENT_AMBULATORY_CARE_PROVIDER_SITE_OTHER): Payer: 59 | Admitting: Allergy

## 2021-08-20 VITALS — BP 138/90 | HR 99 | Temp 98.3°F | Resp 16 | Ht 74.41 in | Wt 326.0 lb

## 2021-08-20 DIAGNOSIS — J4550 Severe persistent asthma, uncomplicated: Secondary | ICD-10-CM

## 2021-08-20 DIAGNOSIS — Z8709 Personal history of other diseases of the respiratory system: Secondary | ICD-10-CM | POA: Diagnosis not present

## 2021-08-20 DIAGNOSIS — J019 Acute sinusitis, unspecified: Secondary | ICD-10-CM | POA: Insufficient documentation

## 2021-08-20 DIAGNOSIS — J302 Other seasonal allergic rhinitis: Secondary | ICD-10-CM | POA: Diagnosis not present

## 2021-08-20 DIAGNOSIS — H1013 Acute atopic conjunctivitis, bilateral: Secondary | ICD-10-CM

## 2021-08-20 DIAGNOSIS — J3089 Other allergic rhinitis: Secondary | ICD-10-CM

## 2021-08-20 MED ORDER — DOXYCYCLINE HYCLATE 100 MG PO CAPS
100.0000 mg | ORAL_CAPSULE | Freq: Two times a day (BID) | ORAL | 0 refills | Status: DC
Start: 2021-08-20 — End: 2021-11-21

## 2021-08-20 NOTE — Assessment & Plan Note (Addendum)
Past history - Perennial rhinoconjunctivitis symptoms for 20+ years with worsening in the spring and fall.  2022 skin testing showed: Positive to grass, ragweed, weed, mold, dust mites, cockroach. ?Interim history - symptoms flared this past week. Not taking antihistamines.  ?? Continue environmental control measures as below. ?? Use over the counter antihistamines such as Zyrtec (cetirizine), Claritin (loratadine), Allegra (fexofenadine), or Xyzal (levocetirizine) daily as needed. May take twice a day during allergy flares. May switch antihistamines every few months. ?? Continue Singulair (montelukast) 10mg  daily at night. ?? Use Flonase (fluticasone) nasal spray 1 spray per nostril twice a day as needed for nasal congestion.  ?? Use azelastine nasal spray 1-2 sprays per nostril twice a day as needed for runny nose/drainage. ?? Nasal saline spray (i.e., Simply Saline) or nasal saline lavage (i.e., NeilMed) is recommended as needed and prior to medicated nasal sprays. ?? Consider allergy injections for long term control if above medications do not help the symptoms - check with your insurance about cost.  ?? Patient on beta blocker.  ?

## 2021-08-20 NOTE — Progress Notes (Signed)
? ?Follow Up Note ? ?RE: Scott BlitzDavid E Buchanan MRN: [id number] DOB: December 27, 1965 ?Date of Office Visit: 08/20/2021 ? ?Referring provider: Massie MaroonHollis, Lachina M, FNP ?Primary care provider: Massie MaroonHollis, Lachina M, FNP ? ?Chief Complaint: Asthma and Allergies (Over half of his time is spent outdoors. Having cloudy yellowish mucus production and head and chest congestion x 1 week. Left ear pain and congestion. ) ? ?History of Present Illness: ?I had the pleasure of seeing Scott DallyDavid Buchanan for a follow up visit at the [facility] on 08/20/2021. He is a 56 y.o. male, who is being followed for asthma on Dupixent injections at home, allergic rhinoconjunctivitis and history of nasal polyps. His previous allergy office visit was on 05/16/2021 with Dr. Selena BattenKim. Today is a regular follow up visit. ? ?Severe persistent asthma ?Currently on Breztri 2 puffs twice a day and Dupixent injection at home every 2 weeks.  ?Used albuterol once in the past 2-3 weeks. ?Using Singulair at night.  ? ?He noted some wheezing at night for the past week and thinks it's due from sinus drainage.   ? ?Denies any ER/urgent care visits or prednisone use since the last visit. ?  ?Seasonal and perennial allergic rhinoconjunctivitis ?Eyes feeling itching/dry and nose feeling itchy. ?As the day goes on he has increased nasal discharge with some discoloration at times. ?Usually gets sinus infection around this time of the year. ?Augmentin caused diarrhea.  ? ?Not taking any additional antihistamines. ?Using Flonase 2 sprays per nostril daily. ?Sometimes dose saline rinses.  ? ?Assessment and Plan: ?Scott Buchanan is a 56 y.o. male with: ?Severe persistent asthma without complication ?Past history - Diagnosed with asthma over 5 years ago but worsening symptoms the last 2 months. 2022 spirometry shows some restriction with 10% and greater than 200 cc improvement in FEV1 post bronchodilator treatment.  Clinically feeling improved.  Started Dupixent Dec 2022. ?Interim history -  was doing well up until this past week, having allergy flare with some wheezing at night.   ?Today's spirometry showed some restriction. ?Daily controller medication(s): continue Breztri 2 puffs twice a day with spacer and rinse mouth afterwards. ?Continue Dupixent injections at home every 2 weeks. ?Continue Singulair (montelukast) 10mg  daily at night. ?May use albuterol rescue inhaler 2 puffs every 4 to 6 hours as needed for shortness of breath, chest tightness, coughing, and wheezing. May use albuterol rescue inhaler 2 puffs 5 to 15 minutes prior to strenuous physical activities. Monitor frequency of use.  ?Get spirometry at next visit. ? ?Seasonal and perennial allergic rhinoconjunctivitis ?Past history - Perennial rhinoconjunctivitis symptoms for 20+ years with worsening in the spring and fall.  2022 skin testing showed: Positive to grass, ragweed, weed, mold, dust mites, cockroach. ?Interim history - symptoms flared this past week. Not taking antihistamines.  ?Continue environmental control measures as below. ?Use over the counter antihistamines such as Zyrtec (cetirizine), Claritin (loratadine), Allegra (fexofenadine), or Xyzal (levocetirizine) daily as needed. May take twice a day during allergy flares. May switch antihistamines every few months. ?Continue Singulair (montelukast) 10mg  daily at night. ?Use Flonase (fluticasone) nasal spray 1 spray per nostril twice a day as needed for nasal congestion.  ?Use azelastine nasal spray 1-2 sprays per nostril twice a day as needed for runny nose/drainage. ?Nasal saline spray (i.e., Simply Saline) or nasal saline lavage (i.e., NeilMed) is recommended as needed and prior to medicated nasal sprays. ?Consider allergy injections for long term control if above medications do not help the symptoms - check with your insurance  about cost.  ?Patient on beta blocker.  ? ?Acute sinusitis ?Monitor symptoms. ?If you think your symptoms are not improving then start doxycycline  100mg  twice a day. ?Okay to use over the counter decongestants for a few days at a time if needed.   ? ?Return in about 3 months (around 11/20/2021). ? ?Meds ordered this encounter  ?Medications  ? doxycycline (VIBRAMYCIN) 100 MG capsule  ?  Sig: Take 1 capsule (100 mg total) by mouth 2 (two) times daily.  ?  Dispense:  20 capsule  ?  Refill:  0  ? ?Lab Orders  ?No laboratory test(s) ordered today  ? ? ?Diagnostics: ?Spirometry:  ?Tracings reviewed. His effort: Good reproducible efforts. ?FVC: 4.48L ?FEV1: 3.57L, 82% predicted ?FEV1/FVC ratio: 80% ?Interpretation: Spirometry consistent with possible restrictive disease.  ?Please see scanned spirometry results for details. ? ?Medication List:  ?Current Outpatient Medications  ?Medication Sig Dispense Refill  ? acetaminophen (TYLENOL) 650 MG CR tablet Take 1,300 mg by mouth every 8 (eight) hours as needed for pain.    ? albuterol (VENTOLIN HFA) 108 (90 Base) MCG/ACT inhaler Inhale 2 puffs into the lungs every 4 (four) hours as needed for wheezing or shortness of breath. 18 g 3  ? amLODipine (NORVASC) 10 MG tablet Take 1 tablet (10 mg total) by mouth daily. 90 tablet 0  ? Ascorbic Acid (VITAMIN C) 1000 MG tablet Take 1,000 mg by mouth daily.    ? aspirin EC 81 MG tablet Take 81 mg by mouth daily.    ? atorvastatin (LIPITOR) 40 MG tablet Take 1 tablet (40 mg total) by mouth daily. 90 tablet 2  ? Budeson-Glycopyrrol-Formoterol (BREZTRI AEROSPHERE) 160-9-4.8 MCG/ACT AERO Inhale 2 puffs into the lungs in the morning and at bedtime. 10.7 g 3  ? cholecalciferol (VITAMIN D) 1000 units tablet Take 1,000 Units by mouth daily.    ? doxycycline (VIBRAMYCIN) 100 MG capsule Take 1 capsule (100 mg total) by mouth 2 (two) times daily. 20 capsule 0  ? Dulaglutide (TRULICITY) 0.75 MG/0.5ML SOPN INJECT 0.5ML (0.75MG ) INTO THE SKIN ONCE WEEKLY 4 mL 11  ? dupilumab (DUPIXENT) 300 MG/2ML prefilled syringe Inject 300 mg into the skin once.    ? fluticasone (FLONASE) 50 MCG/ACT nasal spray  Place 2 sprays into both nostrils at bedtime. 11.1 mL 5  ? gabapentin (NEURONTIN) 300 MG capsule Take 1 capsule (300 mg total) by mouth at bedtime. 90 capsule 2  ? hydrochlorothiazide (HYDRODIURIL) 12.5 MG tablet Take 1 tablet by mouth once daily 90 tablet 0  ? levocetirizine (XYZAL) 5 MG tablet Take 1 tablet (5 mg total) by mouth every evening. 90 tablet 1  ? metFORMIN (GLUCOPHAGE) 1000 MG tablet Take 1 tablet (1,000 mg total) by mouth 2 (two) times daily with a meal. 180 tablet 1  ? metoprolol succinate (TOPROL-XL) 50 MG 24 hr tablet TAKE 1 TABLET BY MOUTH ONCE DAILY(TAKE WITH OR IMMEDIATELY FOLLOWING A MEAL) 90 tablet 3  ? mirabegron ER (MYRBETRIQ) 50 MG TB24 tablet Take 1 tablet (50 mg total) by mouth daily. 28 tablet 0  ? montelukast (SINGULAIR) 10 MG tablet Take 1 tablet (10 mg total) by mouth at bedtime. 90 tablet 0  ? omeprazole (PRILOSEC) 40 MG capsule TAKE 1 CAPSULE BY MOUTH ONCE DAILY . APPOINTMENT REQUIRED FOR FUTURE REFILLS 30 capsule 0  ? tamsulosin (FLOMAX) 0.4 MG CAPS capsule Take 1 capsule (0.4 mg total) by mouth daily. 90 capsule 3  ? valsartan (DIOVAN) 80 MG tablet Take 1 tablet by  mouth once daily 90 tablet 0  ? ?No current facility-administered medications for this visit.  ? ?Allergies: ?No Known Allergies ?I reviewed his past medical history, social history, family history, and environmental history and no significant changes have been reported from his previous visit. ? ?Review of Systems  ?Constitutional:  Negative for appetite change, chills, fever and unexpected weight change.  ?HENT:  Positive for congestion and postnasal drip. Negative for rhinorrhea.   ?Eyes:  Negative for itching.  ?Respiratory:  Positive for wheezing. Negative for cough, chest tightness and shortness of breath.   ?Cardiovascular:  Negative for chest pain.  ?Gastrointestinal:  Negative for abdominal pain.  ?Genitourinary:  Negative for difficulty urinating.  ?Skin:  Negative for rash.  ?Allergic/Immunologic: Positive  for environmental allergies. Negative for food allergies.  ?Neurological:  Negative for headaches.  ? ?Objective: ?BP 138/90   Pulse 99   Temp 98.3 ?F (36.8 ?C) (Temporal)   Resp 16   Ht 6' 2.41" (1.89 m)

## 2021-08-20 NOTE — Patient Instructions (Addendum)
Asthma: ?Daily controller medication(s): continue Breztri 2 puffs twice a day with spacer and rinse mouth afterwards. ?Continue Dupixent injections at home every 2 weeks.  ?Continue Singulair (montelukast) 10mg  daily at night. ?May use albuterol rescue inhaler 2 puffs every 4 to 6 hours as needed for shortness of breath, chest tightness, coughing, and wheezing. May use albuterol rescue inhaler 2 puffs 5 to 15 minutes prior to strenuous physical activities. Monitor frequency of use.  ?Asthma control goals:  ?Full participation in all desired activities (may need albuterol before activity) ?Albuterol use two times or less a week on average (not counting use with activity) ?Cough interfering with sleep two times or less a month ?Oral steroids no more than once a year ?No hospitalizations ? ?Environmental allergies ?2022 skin testing showed: Positive to grass, ragweed, weed, mold, dust mites, cockroach. ?Continue environmental control measures as below. ?Use over the counter antihistamines such as Zyrtec (cetirizine), Claritin (loratadine), Allegra (fexofenadine), or Xyzal (levocetirizine) daily as needed. May take twice a day during allergy flares. May switch antihistamines every few months. ?Continue Singulair (montelukast) 10mg  daily at night. ?Use Flonase (fluticasone) nasal spray 1 spray per nostril twice a day as needed for nasal congestion.  ?Use azelastine nasal spray 1-2 sprays per nostril twice a day as needed for runny nose/drainage. ?Nasal saline spray (i.e., Simply Saline) or nasal saline lavage (i.e., NeilMed) is recommended as needed and prior to medicated nasal sprays. ? ?Consider allergy injections for long term control if above medications do not help the symptoms - check with your insurance about cost.  ? ?Possible sinus infection ?Monitor symptoms. ?If you think your symptoms are not improving then start doxycycline 100mg  twice a day. ?Okay to use over the counter decongestants for a few days at a  time if needed.   ? ?Follow up in 3 months or sooner if needed.  ? ? ?

## 2021-08-20 NOTE — Assessment & Plan Note (Signed)
Past history - Diagnosed with asthma over 5 years ago but worsening symptoms the last 2 months. 2022 spirometry shows some restriction with 10% and greater than 200 cc improvement in FEV1 post bronchodilator treatment.  Clinically feeling improved.  Started Dupixent Dec 2022. ?Interim history - was doing well up until this past week, having allergy flare with some wheezing at night.   ?? Today's spirometry showed some restriction. ?? Daily controller medication(s): continue Breztri 2 puffs twice a day with spacer and rinse mouth afterwards. ?? Continue Dupixent injections at home every 2 weeks. ?? Continue Singulair (montelukast) 10mg  daily at night. ?? May use albuterol rescue inhaler 2 puffs every 4 to 6 hours as needed for shortness of breath, chest tightness, coughing, and wheezing. May use albuterol rescue inhaler 2 puffs 5 to 15 minutes prior to strenuous physical activities. Monitor frequency of use.  ?? Get spirometry at next visit. ?

## 2021-08-20 NOTE — Assessment & Plan Note (Signed)
?   Monitor symptoms. ?? If you think your symptoms are not improving then start doxycycline 100mg  twice a day. ?? Okay to use over the counter decongestants for a few days at a time if needed.   ?

## 2021-08-26 ENCOUNTER — Other Ambulatory Visit: Payer: Self-pay | Admitting: Allergy

## 2021-08-27 ENCOUNTER — Other Ambulatory Visit: Payer: Self-pay

## 2021-08-27 ENCOUNTER — Other Ambulatory Visit: Payer: Self-pay | Admitting: Allergy

## 2021-08-27 DIAGNOSIS — E785 Hyperlipidemia, unspecified: Secondary | ICD-10-CM

## 2021-08-27 DIAGNOSIS — J3089 Other allergic rhinitis: Secondary | ICD-10-CM

## 2021-08-27 MED ORDER — MONTELUKAST SODIUM 10 MG PO TABS: 10.0000 mg | ORAL_TABLET | Freq: Every day | ORAL | 0 refills | Status: DC

## 2021-08-27 MED ORDER — ATORVASTATIN CALCIUM 40 MG PO TABS: 40.0000 mg | ORAL_TABLET | Freq: Every day | ORAL | 2 refills | Status: DC

## 2021-08-29 DIAGNOSIS — J3089 Other allergic rhinitis: Secondary | ICD-10-CM

## 2021-08-29 NOTE — Progress Notes (Signed)
VIALS EXP 08-30-22 ?

## 2021-08-29 NOTE — Progress Notes (Signed)
Aeroallergen Immunotherapy  ? ?Ordering Provider: Dr. Wyline Mood  ? ?Patient Details  ?Name: Scott Buchanan  ?MRN: [id number]  ?Date of Birth: 18-Mar-1966  ? ?Order 1 of 2  ? ?Vial Label: G-Rw-W-M  ? ?0.3 ml (Volume)  BAU Concentration -- 7 Grass Mix* 100,000 ([address] [place], [place], [place], [place], [place], [place], [place])  ?0.2 ml (Volume)  1:20 Concentration -- [place]  ?0.3 ml (Volume)  BAU Concentration -- [place] 10,000  ?0.2 ml (Volume)  1:20 Concentration -- [place]  ?0.3 ml (Volume)  1:20 Concentration -- Ragweed Mix  ?0.5 ml (Volume)  1:20 Concentration -- Weed Mix*  ?0.5 ml (Volume)   AU Concentration -- [place]ite Mix (DF 5,000 & DP 5,000)  ? ? ?2.3  ml Extract Subtotal  ?2.7.  ml Diluent  ?5.0  ml Maintenance Total  ? ?Schedule:  B  ?Silver Vial (1:1,000,000): Schedule B (6 doses)  ?Blue Vial (1:100,000): Schedule B (6 doses)  ?Yellow Vial (1:10,000): Schedule B (6 doses)  ?Green Vial (1:1,000): Schedule B (6 doses)  ?Red Vial (1:100): Schedule A (14 doses)  ? ?Special Instructions: once per week. Patient is on beta blocker. ?

## 2021-08-29 NOTE — Progress Notes (Signed)
Aeroallergen Immunotherapy  ? ?Ordering Provider: Dr. Wyline Mood  ? ?Patient Details  ?Name: Scott Buchanan  ?MRN: [id number]  ?Date of Birth: 1965/05/27  ? ?Order 2 of 2  ? ?Vial Label: M-Cr  ? ?0.2 ml (Volume)  1:20 Concentration -- Alternaria alternata  ?0.2 ml (Volume)  1:20 Concentration -- Cladosporium herbarum  ?0.2 ml (Volume)  1:10 Concentration -- Aspergillus mix  ?0.2 ml (Volume)  1:10 Concentration -- Penicillium mix  ?0.2 ml (Volume)  1:20 Concentration -- Bipolaris sorokiniana  ?0.2 ml (Volume)  1:20 Concentration -- Drechslera spicifera  ?0.2 ml (Volume)  1:10 Concentration -- Mucor plumbeus  ?0.2 ml (Volume)  1:10 Concentration -- Fusarium moniliforme  ?0.2 ml (Volume)  1:40 Concentration -- Aureobasidium pullulans  ?0.2 ml (Volume)  1:10 Concentration -- Rhizopus oryzae  ?0.3 ml (Volume)  1:20 Concentration -- Cockroach, [place]  ? ? ?2.3  ml Extract Subtotal  ?2.7  ml Diluent  ?5.0  ml Maintenance Total  ? ?Schedule:  B  ?Silver Vial (1:1,000,000): Schedule B (6 doses)  ?Blue Vial (1:100,000): Schedule B (6 doses)  ?Yellow Vial (1:10,000): Schedule B (6 doses)  ?Green Vial (1:1,000): Schedule B (6 doses)  ?Red Vial (1:100): Schedule A (14 doses)  ? ?Special Instructions: once per week, patient is in beta blocker. ?

## 2021-08-30 DIAGNOSIS — J302 Other seasonal allergic rhinitis: Secondary | ICD-10-CM

## 2021-08-31 ENCOUNTER — Other Ambulatory Visit: Payer: Self-pay | Admitting: Family Medicine

## 2021-08-31 DIAGNOSIS — I10 Essential (primary) hypertension: Secondary | ICD-10-CM

## 2021-09-02 ENCOUNTER — Other Ambulatory Visit: Payer: Self-pay

## 2021-09-02 DIAGNOSIS — I10 Essential (primary) hypertension: Secondary | ICD-10-CM

## 2021-09-02 LAB — HM DIABETES EYE EXAM

## 2021-09-02 MED ORDER — AMLODIPINE BESYLATE 10 MG PO TABS: 10.0000 mg | ORAL_TABLET | Freq: Every day | ORAL | 0 refills | Status: DC

## 2021-09-12 ENCOUNTER — Ambulatory Visit (INDEPENDENT_AMBULATORY_CARE_PROVIDER_SITE_OTHER): Payer: 59

## 2021-09-12 DIAGNOSIS — J309 Allergic rhinitis, unspecified: Secondary | ICD-10-CM

## 2021-09-12 NOTE — Progress Notes (Signed)
Immunotherapy   Patient Details  Name: Scott Buchanan MRN: [id number] Date of Birth: 03-Jul-1965  09/12/2021  [name] started injections for  G-RW-W-DM and Molds-CR. Patient received 0.05 of both his silver vials with an expiration of 08/30/2022. Patient waited 30 minutes with no problems. Following schedule: B  Frequency:1 time per week Epi-Pen:Epi-Pen Available  Consent signed and patient instructions given.   [name] 09/12/2021, 8:52 AM

## 2021-09-17 ENCOUNTER — Ambulatory Visit: Payer: 59 | Admitting: Family Medicine

## 2021-09-18 ENCOUNTER — Ambulatory Visit (INDEPENDENT_AMBULATORY_CARE_PROVIDER_SITE_OTHER): Payer: 59

## 2021-09-18 DIAGNOSIS — J309 Allergic rhinitis, unspecified: Secondary | ICD-10-CM | POA: Diagnosis not present

## 2021-09-19 ENCOUNTER — Encounter: Payer: Self-pay | Admitting: Family Medicine

## 2021-09-19 ENCOUNTER — Ambulatory Visit (INDEPENDENT_AMBULATORY_CARE_PROVIDER_SITE_OTHER): Payer: 59 | Admitting: Family Medicine

## 2021-09-19 VITALS — BP 138/89 | HR 74 | Temp 98.1°F | Ht 76.0 in | Wt 327.5 lb

## 2021-09-19 DIAGNOSIS — K219 Gastro-esophageal reflux disease without esophagitis: Secondary | ICD-10-CM

## 2021-09-19 DIAGNOSIS — E1169 Type 2 diabetes mellitus with other specified complication: Secondary | ICD-10-CM | POA: Diagnosis not present

## 2021-09-19 DIAGNOSIS — E669 Obesity, unspecified: Secondary | ICD-10-CM | POA: Diagnosis not present

## 2021-09-19 DIAGNOSIS — I10 Essential (primary) hypertension: Secondary | ICD-10-CM | POA: Diagnosis not present

## 2021-09-19 DIAGNOSIS — E785 Hyperlipidemia, unspecified: Secondary | ICD-10-CM

## 2021-09-19 LAB — POCT GLYCOSYLATED HEMOGLOBIN (HGB A1C)
HbA1c POC (<> result, manual entry): 6.7 % (ref 4.0–5.6)
HbA1c, POC (controlled diabetic range): 6.7 % (ref 0.0–7.0)
HbA1c, POC (prediabetic range): 6.7 % — AB (ref 5.7–6.4)
Hemoglobin A1C: 6.7 % — AB (ref 4.0–5.6)

## 2021-09-19 LAB — POCT URINALYSIS DIP (CLINITEK)
Bilirubin, UA: NEGATIVE
Blood, UA: NEGATIVE
Glucose, UA: NEGATIVE mg/dL
Ketones, POC UA: NEGATIVE mg/dL
Leukocytes, UA: NEGATIVE
Nitrite, UA: NEGATIVE
POC PROTEIN,UA: NEGATIVE
Spec Grav, UA: 1.01 (ref 1.010–1.025)
Urobilinogen, UA: 0.2 E.U./dL
pH, UA: 5.5 (ref 5.0–8.0)

## 2021-09-19 MED ORDER — OMEPRAZOLE 40 MG PO CPDR: DELAYED_RELEASE_CAPSULE | ORAL | 1 refills | Status: DC

## 2021-09-19 NOTE — Progress Notes (Signed)
Patient [facility]   Established Patient Office Visit  Subjective   Patient ID: Scott Buchanan, male    DOB: 20-Aug-1965  Age: 56 y.o. MRN: [id number]  Chief Complaint  Patient presents with   Follow-up    Pt is here for DM follow up.    Scott Buchanan is a 56 year old male with a medical history significant for type 2 diabetes mellitus, hypertension, hyperlipidemia, morbid obesity, and GERD presents for a follow-up of chronic conditions.  Patient has been doing very well and is without complaint.  He continues to be followed by allergist for moderate persistent asthma and is currently on allergy injections that started last week.  Patient states that he has not had any complications.  Diabetes He presents for his follow-up diabetic visit. He has type 2 diabetes mellitus. His disease course has been improving. Pertinent negatives for hypoglycemia include no headaches or sweats. Pertinent negatives for diabetes include no blurred vision, no chest pain, no fatigue, no foot paresthesias, no polydipsia, no polyphagia, no polyuria and no visual change. Pertinent negatives for diabetic complications include no retinopathy. Risk factors for coronary artery disease include obesity and sedentary lifestyle.  Hypertension This is a chronic problem. The problem is controlled. Pertinent negatives include no anxiety, blurred vision, chest pain, headaches, neck pain, orthopnea, palpitations, PND, shortness of breath or sweats. Risk factors for coronary artery disease include male gender and sedentary lifestyle. There is no history of kidney disease or retinopathy. There is no history of sleep apnea.   Patient Active Problem List   Diagnosis Date Noted   Acute sinusitis 08/20/2021   Severe persistent asthma without complication 40/98/1191   Seasonal and perennial allergic rhinoconjunctivitis 03/18/2021   Asthma, not well controlled 10/30/2020   History of nasal polyp  10/30/2020   Heartburn 10/30/2020   Angina pectoris ([place]) 09/04/2020   Atypical chest pain    Shortness of breath    Acute diarrhea 11/20/2019   Change in bowel habits 11/20/2019   Elevated LFTs 11/20/2019   Fatty liver 11/20/2019   History of esophagitis 11/20/2019   Gastritis 03/11/2019   Screening for viral disease 03/11/2019   Musculoskeletal pain 03/11/2019   Esophagitis 03/11/2019   LUQ pain 03/11/2019   Hav (hallux abducto valgus), right 12/21/2018   Hyperlipidemia associated with type 2 diabetes mellitus ([place]) 05/14/2018   Primary localized osteoarthritis of right knee 03/02/2018   Status post right partial knee replacement 03/02/2018   Morbid obesity ([place]) 07/27/2017   Type 2 diabetes mellitus without complication, without long-term current use of insulin ([place]) 05/12/2017   Chronic frontal sinusitis 01/02/2017   Essential hypertension 10/21/2016   Hyperlipidemia LDL goal <100 10/21/2016   Elevated serum glucose 10/21/2016   Vitamin D deficiency 10/21/2016   Need for Tdap vaccination 10/21/2016   Obesity, morbid, BMI 40.0-49.9 ([place]) 06/26/2015   Knee instability 12/15/2013   Seasonal allergies 11/09/2013   Past Medical History:  Diagnosis Date   Arthritis    Asthma    Diabetes mellitus without complication ([place])    type 2   Hypertension    Primary localized osteoarthritis of right knee 03/02/2018   Past Surgical History:  Procedure Laterality Date   CYSTECTOMY     tailbone   ETHMOIDECTOMY Bilateral 04/07/2017   Procedure: BILATERAL ETHMOIDECTOMY AND SPHENOIDECTOMY;  Surgeon: Leta Baptist, MD;  Location: [facility];  Service: ENT;  Laterality: Bilateral;   KNEE ARTHROSCOPY W/ MENISCAL REPAIR  right and left knee one year apart   LEFT HEART CATH AND CORONARY ANGIOGRAPHY N/A 09/04/2020   Procedure: LEFT HEART CATH AND CORONARY ANGIOGRAPHY;  Surgeon: Martinique, Peter M, MD;  Location: [facility];  Service: Cardiovascular;  Laterality: N/A;    PARTIAL KNEE ARTHROPLASTY Right 03/02/2018   Procedure: UNICOMPARTMENTAL KNEE;  Surgeon: Marchia Bond, MD;  Location: [facility];  Service: Orthopedics;  Laterality: Right;  with block   SINUS ENDO WITH FUSION Bilateral 04/07/2017   Procedure: BILATERAL FRONTAL RECESS SINUS EXPLORATION WITH SINUS FUSION NAVIGATION;  Surgeon: Leta Baptist, MD;  Location: [facility];  Service: ENT;  Laterality: Bilateral;   SINUS EXPLORATION     TONSILLECTOMY     TURBINATE REDUCTION Bilateral 04/07/2017   Procedure: BILATERAL TURBINATE REDUCTION;  Surgeon: Leta Baptist, MD;  Location: [facility];  Service: ENT;  Laterality: Bilateral;   unicompartmental right knee     Social History   Tobacco Use   Smoking status: Former    Types: Cigarettes   Smokeless tobacco: Never   Tobacco comments:    quit 10 years ago  Vaping Use   Vaping Use: Never used  Substance Use Topics   Alcohol use: No   Drug use: No   Social History   Socioeconomic History   Marital status: Single    Spouse name: Not on file   Number of children: 1   Years of education: Not on file   Highest education level: Not on file  Occupational History   Occupation: maintance   Tobacco Use   Smoking status: Former    Types: Cigarettes   Smokeless tobacco: Never   Tobacco comments:    quit 10 years ago  Vaping Use   Vaping Use: Never used  Substance and Sexual Activity   Alcohol use: No   Drug use: No   Sexual activity: Yes  Other Topics Concern   Not on file  Social History Narrative   Not on file   Social Determinants of Health   Financial Resource Strain: Not on file  Food Insecurity: Not on file  Transportation Needs: Not on file  Physical Activity: Not on file  Stress: Not on file  Social Connections: Not on file  Intimate Partner Violence: Not on file   Family Status  Relation Name Status   Mother  Deceased   Father  Deceased   Neg Hx  (Not Specified)   Family History  Problem Relation  Age of Onset   Hypertension Mother    Colon cancer Neg Hx    Colon polyps Neg Hx    Stomach cancer Neg Hx    Rectal cancer Neg Hx    Esophageal cancer Neg Hx    Inflammatory bowel disease Neg Hx    Liver disease Neg Hx    Pancreatic cancer Neg Hx    No Known Allergies    Review of Systems  Constitutional: Negative.  Negative for fatigue.  HENT: Negative.    Eyes: Negative.  Negative for blurred vision.  Respiratory: Negative.  Negative for shortness of breath.   Cardiovascular: Negative.  Negative for chest pain, palpitations, orthopnea and PND.  Gastrointestinal: Negative.   Genitourinary: Negative.   Musculoskeletal: Negative.  Negative for neck pain.  Skin: Negative.   Neurological: Negative.  Negative for headaches.  Endo/Heme/Allergies:  Negative for polydipsia and polyphagia.  Psychiatric/Behavioral: Negative.       Objective:     BP 138/89 (BP Location: Right Arm, Patient  Position: Sitting, Cuff Size: Large)   Pulse 74   Temp 98.1 F (36.7 C)   Ht '6\' 4"'  (1.93 m)   Wt (!) 327 lb 8 oz (148.6 kg)   SpO2 98%   BMI 39.86 kg/m  BP Readings from Last 3 Encounters:  09/19/21 138/89  08/20/21 138/90  07/25/21 (!) 164/91   Wt Readings from Last 3 Encounters:  09/19/21 (!) 327 lb 8 oz (148.6 kg)  08/20/21 (!) 326 lb (147.9 kg)  07/25/21 (!) 330 lb (149.7 kg)     Physical Exam Constitutional:      Appearance: He is obese.  Eyes:     Pupils: Pupils are equal, round, and reactive to light.  Cardiovascular:     Rate and Rhythm: Normal rate and regular rhythm.  Pulmonary:     Effort: Pulmonary effort is normal.  Abdominal:     General: Bowel sounds are normal.  Skin:    General: Skin is warm.  Neurological:     General: No focal deficit present.     Mental Status: Mental status is at baseline.  Psychiatric:        Mood and Affect: Mood normal.        Behavior: Behavior normal.        Thought Content: Thought content normal.        Judgment: Judgment  normal.     Results for orders placed or performed in visit on 09/19/21  POCT glycosylated hemoglobin (Hb A1C)  Result Value Ref Range   Hemoglobin A1C 6.7 (A) 4.0 - 5.6 %   HbA1c POC (<> result, manual entry) 6.7 4.0 - 5.6 %   HbA1c, POC (prediabetic range) 6.7 (A) 5.7 - 6.4 %   HbA1c, POC (controlled diabetic range) 6.7 0.0 - 7.0 %    Last CBC Lab Results  Component Value Date   WBC 7.7 03/18/2021   HGB 14.3 03/18/2021   HCT 42.7 03/18/2021   MCV 85 03/18/2021   MCH 28.5 03/18/2021   RDW 13.4 03/18/2021   PLT 397 39/76/7341   Last metabolic panel Lab Results  Component Value Date   GLUCOSE 87 07/18/2021   NA 139 07/18/2021   K 4.1 07/18/2021   CL 102 07/18/2021   CO2 23 07/18/2021   BUN 13 07/18/2021   CREATININE 0.89 07/18/2021   EGFR 102 07/18/2021   CALCIUM 9.1 07/18/2021   PROT 6.9 07/18/2021   ALBUMIN 4.5 07/18/2021   LABGLOB 2.4 07/18/2021   AGRATIO 1.9 07/18/2021   BILITOT 0.4 07/18/2021   ALKPHOS 58 07/18/2021   AST 36 07/18/2021   ALT 51 (H) 07/18/2021   ANIONGAP 13 06/11/2020   Last lipids Lab Results  Component Value Date   CHOL 185 10/09/2020   HDL 33 (L) 10/09/2020   LDLCALC 119 (H) 10/09/2020   TRIG 188 (H) 10/09/2020   CHOLHDL 5.6 (H) 10/09/2020   Last hemoglobin A1c Lab Results  Component Value Date   HGBA1C 6.7 (A) 09/19/2021   HGBA1C 6.7 09/19/2021   HGBA1C 6.7 (A) 09/19/2021   HGBA1C 6.7 09/19/2021   Last thyroid functions Lab Results  Component Value Date   TSH 1.270 03/13/2020   T4TOTAL 6.5 11/28/2016   Last vitamin D Lab Results  Component Value Date   VD25OH 30.0 03/13/2020   Last vitamin B12 and Folate Lab Results  Component Value Date   [id number] 353 12/13/2019      The 10-year ASCVD risk score (Arnett DK, et al., 2019) is: 30.1%  Assessment & Plan:   Problem List Items Addressed This Visit   None Visit Diagnoses     Diabetes mellitus type 2 in obese ([place])    -  Primary   Relevant Orders   POCT  glycosylated hemoglobin (Hb A1C) (Completed)   POCT URINALYSIS DIP (CLINITEK)     1. Diabetes mellitus type 2 in obese ([facility]) Hemoglobin A1c is 6.7, which is at goal.  No medication changes are warranted.  Discussed the importance of following a carbohydrate modified diet in order to achieve positive outcomes, patient expressed understanding. - POCT glycosylated hemoglobin (Hb A1C) - POCT URINALYSIS DIP (CLINITEK) - Lipid Panel - Comprehensive metabolic panel  2. Gastroesophageal reflux disease without esophagitis Patient to follow-up with gastroenterology team as scheduled. - omeprazole (PRILOSEC) 40 MG capsule; TAKE 1 CAPSULE BY MOUTH ONCE DAILY . APPOINTMENT REQUIRED FOR FUTURE REFILLS  Dispense: 90 capsule; Refill: 1   3. Morbid obesity ([place]) The patient is asked to make an attempt to improve diet and exercise patterns to aid in medical management of this problem.  - Lipid Panel  4. Essential hypertension BP 138/89 (BP Location: Right Arm, Patient Position: Sitting, Cuff Size: Large)   Pulse 74   Temp 98.1 F (36.7 C)   Ht '6\' 4"'  (1.93 m)   Wt (!) 327 lb 8 oz (148.6 kg)   SpO2 98%   BMI 39.86 kg/m  - Continue medication, monitor blood pressure at home. Continue DASH diet.  Reminder to go to the ER if any CP, SOB, nausea, dizziness, severe HA, changes vision/speech, left arm numbness and tingling and jaw pain.   - Comprehensive metabolic panel  5. Hyperlipidemia LDL goal <100 The 10-year ASCVD risk score (Arnett DK, et al., 2019) is: 30.1%   Values used to calculate the score:     Age: 81 years     Sex: Male     Is Non-Hispanic African American: No     Diabetic: Yes     Tobacco smoker: Yes     Systolic Blood Pressure: 277 mmHg     Is BP treated: Yes     HDL Cholesterol: 33 mg/dL     Total Cholesterol: 185 mg/dL  - Lipid Panel  Return in about 3 months (around 12/20/2021) for diabetes, obesity, hypertension.   Donia Pounds  APRN, MSN, FNP-C Patient [facility] [address] [place], [place] [address] [phone]

## 2021-09-20 LAB — COMPREHENSIVE METABOLIC PANEL
ALT: 58 IU/L — ABNORMAL HIGH (ref 0–44)
AST: 34 IU/L (ref 0–40)
Albumin/Globulin Ratio: 2 (ref 1.2–2.2)
Albumin: 4.7 g/dL (ref 3.8–4.9)
Alkaline Phosphatase: 53 IU/L (ref 44–121)
BUN/Creatinine Ratio: 11 (ref 9–20)
BUN: 11 mg/dL (ref 6–24)
Bilirubin Total: 0.5 mg/dL (ref 0.0–1.2)
CO2: 22 mmol/L (ref 20–29)
Calcium: 10.1 mg/dL (ref 8.7–10.2)
Chloride: 103 mmol/L (ref 96–106)
Creatinine, Ser: 0.99 mg/dL (ref 0.76–1.27)
Globulin, Total: 2.3 g/dL (ref 1.5–4.5)
Glucose: 119 mg/dL — ABNORMAL HIGH (ref 70–99)
Potassium: 4.8 mmol/L (ref 3.5–5.2)
Sodium: 141 mmol/L (ref 134–144)
Total Protein: 7 g/dL (ref 6.0–8.5)
eGFR: 91 mL/min/{1.73_m2} (ref >59)

## 2021-09-20 LAB — LIPID PANEL
Chol/HDL Ratio: 4.1 ratio (ref 0.0–5.0)
Cholesterol, Total: 136 mg/dL (ref 100–199)
HDL: 33 mg/dL — ABNORMAL LOW (ref >39)
LDL Chol Calc (NIH): 79 mg/dL (ref 0–99)
Triglycerides: 131 mg/dL (ref 0–149)
VLDL Cholesterol Cal: 24 mg/dL (ref 5–40)

## 2021-09-23 ENCOUNTER — Ambulatory Visit (INDEPENDENT_AMBULATORY_CARE_PROVIDER_SITE_OTHER): Payer: 59

## 2021-09-23 DIAGNOSIS — J309 Allergic rhinitis, unspecified: Secondary | ICD-10-CM

## 2021-09-27 ENCOUNTER — Other Ambulatory Visit: Payer: Self-pay | Admitting: Cardiology

## 2021-09-30 ENCOUNTER — Ambulatory Visit (INDEPENDENT_AMBULATORY_CARE_PROVIDER_SITE_OTHER): Payer: 59

## 2021-09-30 DIAGNOSIS — J309 Allergic rhinitis, unspecified: Secondary | ICD-10-CM

## 2021-10-07 ENCOUNTER — Ambulatory Visit (INDEPENDENT_AMBULATORY_CARE_PROVIDER_SITE_OTHER): Payer: 59

## 2021-10-07 DIAGNOSIS — J309 Allergic rhinitis, unspecified: Secondary | ICD-10-CM

## 2021-10-14 ENCOUNTER — Other Ambulatory Visit: Payer: Self-pay

## 2021-10-14 DIAGNOSIS — I10 Essential (primary) hypertension: Secondary | ICD-10-CM

## 2021-10-14 MED ORDER — HYDROCHLOROTHIAZIDE 12.5 MG PO TABS: 12.5000 mg | ORAL_TABLET | Freq: Every day | ORAL | 0 refills | Status: DC

## 2021-10-15 ENCOUNTER — Ambulatory Visit (INDEPENDENT_AMBULATORY_CARE_PROVIDER_SITE_OTHER): Payer: 59

## 2021-10-15 DIAGNOSIS — J309 Allergic rhinitis, unspecified: Secondary | ICD-10-CM | POA: Diagnosis not present

## 2021-10-24 ENCOUNTER — Other Ambulatory Visit: Payer: Self-pay | Admitting: Cardiology

## 2021-10-25 ENCOUNTER — Ambulatory Visit (INDEPENDENT_AMBULATORY_CARE_PROVIDER_SITE_OTHER): Payer: 59 | Admitting: *Deleted

## 2021-10-25 DIAGNOSIS — J309 Allergic rhinitis, unspecified: Secondary | ICD-10-CM

## 2021-10-27 ENCOUNTER — Other Ambulatory Visit: Payer: Self-pay | Admitting: Physician Assistant

## 2021-10-27 DIAGNOSIS — E1169 Type 2 diabetes mellitus with other specified complication: Secondary | ICD-10-CM

## 2021-10-31 ENCOUNTER — Ambulatory Visit (INDEPENDENT_AMBULATORY_CARE_PROVIDER_SITE_OTHER): Payer: 59

## 2021-10-31 ENCOUNTER — Other Ambulatory Visit: Payer: Self-pay | Admitting: Physician Assistant

## 2021-10-31 DIAGNOSIS — E1169 Type 2 diabetes mellitus with other specified complication: Secondary | ICD-10-CM

## 2021-10-31 DIAGNOSIS — J309 Allergic rhinitis, unspecified: Secondary | ICD-10-CM | POA: Diagnosis not present

## 2021-11-05 ENCOUNTER — Ambulatory Visit (INDEPENDENT_AMBULATORY_CARE_PROVIDER_SITE_OTHER): Payer: 59

## 2021-11-05 DIAGNOSIS — J309 Allergic rhinitis, unspecified: Secondary | ICD-10-CM

## 2021-11-13 ENCOUNTER — Other Ambulatory Visit: Payer: Self-pay | Admitting: Family Medicine

## 2021-11-13 ENCOUNTER — Ambulatory Visit (INDEPENDENT_AMBULATORY_CARE_PROVIDER_SITE_OTHER): Payer: 59

## 2021-11-13 DIAGNOSIS — J3089 Other allergic rhinitis: Secondary | ICD-10-CM

## 2021-11-13 DIAGNOSIS — E1169 Type 2 diabetes mellitus with other specified complication: Secondary | ICD-10-CM

## 2021-11-13 DIAGNOSIS — J309 Allergic rhinitis, unspecified: Secondary | ICD-10-CM | POA: Diagnosis not present

## 2021-11-13 MED ORDER — OXYBUTYNIN CHLORIDE ER 10 MG PO TB24: 10.0000 mg | ORAL_TABLET | Freq: Every day | ORAL | 5 refills | Status: DC

## 2021-11-13 MED ORDER — MONTELUKAST SODIUM 10 MG PO TABS: 10.0000 mg | ORAL_TABLET | Freq: Every day | ORAL | 1 refills | Status: DC

## 2021-11-13 NOTE — Progress Notes (Signed)
Meds ordered this encounter  Medications   montelukast (SINGULAIR) 10 MG tablet    Sig: Take 1 tablet (10 mg total) by mouth at bedtime.    Dispense:  90 tablet    Refill:  1    Order Specific Question:   Supervising Provider    Answer:   Quentin Angst [[id number]]   oxybutynin (DITROPAN-XL) 10 MG 24 hr tablet    Sig: Take 1 tablet (10 mg total) by mouth daily.    Dispense:  30 tablet    Refill:  5    Order Specific Question:   Supervising Provider    Answer:   Quentin Angst [[id number]]      Nolon Nations  APRN, MSN, FNP-C Patient Care [facility] [address] [place], [place] [address] [phone]

## 2021-11-15 ENCOUNTER — Telehealth: Payer: Self-pay

## 2021-11-15 NOTE — Telephone Encounter (Signed)
Trulicity 0.75MG /0.5ML pen-injectors does not need a prior authorization   [employer] Rx Prior Authorization Team is unable to review this request for prior authorization as the requested medication is on formulary and does not require a prior authorization. No further action is needed at this time.

## 2021-11-16 ENCOUNTER — Other Ambulatory Visit: Payer: Self-pay | Admitting: Family Medicine

## 2021-11-16 DIAGNOSIS — E1169 Type 2 diabetes mellitus with other specified complication: Secondary | ICD-10-CM

## 2021-11-16 DIAGNOSIS — J3089 Other allergic rhinitis: Secondary | ICD-10-CM

## 2021-11-16 MED ORDER — MONTELUKAST SODIUM 10 MG PO TABS: 10.0000 mg | ORAL_TABLET | Freq: Every day | ORAL | 3 refills | Status: DC

## 2021-11-16 MED ORDER — OXYBUTYNIN CHLORIDE ER 10 MG PO TB24: 10.0000 mg | ORAL_TABLET | Freq: Every day | ORAL | 5 refills | Status: DC

## 2021-11-16 NOTE — Progress Notes (Signed)
Meds ordered this encounter  Medications   montelukast (SINGULAIR) 10 MG tablet    Sig: Take 1 tablet (10 mg total) by mouth at bedtime.    Dispense:  90 tablet    Refill:  3    Order Specific Question:   Supervising Provider    Answer:   Quentin Angst [[id number]]   oxybutynin (DITROPAN-XL) 10 MG 24 hr tablet    Sig: Take 1 tablet (10 mg total) by mouth daily.    Dispense:  30 tablet    Refill:  5    Order Specific Question:   Supervising Provider    Answer:   Quentin Angst [id number]

## 2021-11-17 ENCOUNTER — Other Ambulatory Visit: Payer: Self-pay | Admitting: Allergy

## 2021-11-20 NOTE — Progress Notes (Unsigned)
Follow Up Note  RE: Scott Buchanan MRN: [id number] DOB: 02/27/1966 Date of Office Visit: 11/21/2021  Referring provider: Massie Maroon, FNP Primary care provider: Massie Maroon, FNP  Chief Complaint: No chief complaint on file.  History of Present Illness: I had the pleasure of seeing Scott Buchanan for a follow up visit at the [facility] on 11/20/2021. He is a 56 y.o. male, who is being followed for asthma on Dupixent, allergic rhinoconjunctivitis on AIT. His previous allergy office visit was on 08/20/2021 with Dr. Selena Batten. Today is a regular follow up visit.  Severe persistent asthma without complication Past history - Diagnosed with asthma over 5 years ago but worsening symptoms the last 2 months. 2022 spirometry shows some restriction with 10% and greater than 200 cc improvement in FEV1 post bronchodilator treatment.  Clinically feeling improved.  Started Dupixent Dec 2022. Interim history - was doing well up until this past week, having allergy flare with some wheezing at night.   Today's spirometry showed some restriction. Daily controller medication(s): continue Breztri 2 puffs twice a day with spacer and rinse mouth afterwards. Continue Dupixent injections at home every 2 weeks. Continue Singulair (montelukast) 10mg  daily at night. May use albuterol rescue inhaler 2 puffs every 4 to 6 hours as needed for shortness of breath, chest tightness, coughing, and wheezing. May use albuterol rescue inhaler 2 puffs 5 to 15 minutes prior to strenuous physical activities. Monitor frequency of use.  Get spirometry at next visit.   Seasonal and perennial allergic rhinoconjunctivitis Past history - Perennial rhinoconjunctivitis symptoms for 20+ years with worsening in the spring and fall.  2022 skin testing showed: Positive to grass, ragweed, weed, mold, dust mites, cockroach. Interim history - symptoms flared this past week. Not taking antihistamines.  Continue environmental  control measures as below. Use over the counter antihistamines such as Zyrtec (cetirizine), Claritin (loratadine), Allegra (fexofenadine), or Xyzal (levocetirizine) daily as needed. May take twice a day during allergy flares. May switch antihistamines every few months. Continue Singulair (montelukast) 10mg  daily at night. Use Flonase (fluticasone) nasal spray 1 spray per nostril twice a day as needed for nasal congestion.  Use azelastine nasal spray 1-2 sprays per nostril twice a day as needed for runny nose/drainage. Nasal saline spray (i.e., Simply Saline) or nasal saline lavage (i.e., NeilMed) is recommended as needed and prior to medicated nasal sprays. Consider allergy injections for long term control if above medications do not help the symptoms - check with your insurance about cost.  Patient on beta blocker.    Acute sinusitis Monitor symptoms. If you think your symptoms are not improving then start doxycycline 100mg  twice a day. Okay to use over the counter decongestants for a few days at a time if needed.     Return in about 3 months (around 11/20/2021).  Assessment and Plan: Scott Buchanan is a 56 y.o. male with: No problem-specific Assessment & Plan notes found for this encounter.  No follow-ups on file.  No orders of the defined types were placed in this encounter.  Lab Orders  No laboratory test(s) ordered today    Diagnostics: Spirometry:  Tracings reviewed. His effort: {Blank single:19197::"Good reproducible efforts.","It was hard to get consistent efforts and there is a question as to whether this reflects a maximal maneuver.","Poor effort, data can not be interpreted."} FVC: ***L FEV1: ***L, ***% predicted FEV1/FVC ratio: ***% Interpretation: {Blank single:19197::"Spirometry consistent with mild obstructive disease","Spirometry consistent with moderate obstructive disease","Spirometry consistent with severe obstructive  disease","Spirometry consistent with possible restrictive  disease","Spirometry consistent with mixed obstructive and restrictive disease","Spirometry uninterpretable due to technique","Spirometry consistent with normal pattern","No overt abnormalities noted given today's efforts"}.  Please see scanned spirometry results for details.  Skin Testing: {Blank single:19197::"Select foods","Environmental allergy panel","Environmental allergy panel and select foods","Food allergy panel","None","Deferred due to recent antihistamines use"}. *** Results discussed with patient/family.   Medication List:  Current Outpatient Medications  Medication Sig Dispense Refill  . acetaminophen (TYLENOL) 650 MG CR tablet Take 1,300 mg by mouth every 8 (eight) hours as needed for pain.    Marland Kitchen albuterol (VENTOLIN HFA) 108 (90 Base) MCG/ACT inhaler Inhale 2 puffs into the lungs every 4 (four) hours as needed for wheezing or shortness of breath. 18 g 3  . amLODipine (NORVASC) 10 MG tablet Take 1 tablet (10 mg total) by mouth daily. 90 tablet 0  . Ascorbic Acid (VITAMIN C) 1000 MG tablet Take 1,000 mg by mouth daily.    Marland Kitchen aspirin EC 81 MG tablet Take 81 mg by mouth daily.    Marland Kitchen atorvastatin (LIPITOR) 40 MG tablet Take 1 tablet (40 mg total) by mouth daily. 90 tablet 2  . BREZTRI AEROSPHERE 160-9-4.8 MCG/ACT AERO INHALE 2 PUFFS IN THE MORNING AND AT BEDTIME 10.7 g 5  . cholecalciferol (VITAMIN D) 1000 units tablet Take 1,000 Units by mouth daily.    Marland Kitchen doxycycline (VIBRAMYCIN) 100 MG capsule Take 1 capsule (100 mg total) by mouth 2 (two) times daily. 20 capsule 0  . Dulaglutide (TRULICITY) 0.75 MG/0.5ML SOPN INJECT 0.5ML (0.75MG ) INTO THE SKIN ONCE WEEKLY 4 mL 11  . dupilumab (DUPIXENT) 300 MG/2ML prefilled syringe Inject 300 mg into the skin once.    . fluticasone (FLONASE) 50 MCG/ACT nasal spray Place 2 sprays into both nostrils at bedtime. 11.1 mL 5  . gabapentin (NEURONTIN) 300 MG capsule Take 1 capsule (300 mg total) by mouth at bedtime. 90 capsule 2  . hydrochlorothiazide  (HYDRODIURIL) 12.5 MG tablet Take 1 tablet (12.5 mg total) by mouth daily. 90 tablet 0  . levocetirizine (XYZAL) 5 MG tablet Take 1 tablet (5 mg total) by mouth every evening. 90 tablet 1  . metFORMIN (GLUCOPHAGE) 1000 MG tablet Take 1 tablet (1,000 mg total) by mouth 2 (two) times daily with a meal. 180 tablet 1  . metoprolol succinate (TOPROL-XL) 50 MG 24 hr tablet TAKE 1 TABLET BY MOUTH ONCE DAILY(TAKE WITH OR IMMEDIATELY FOLLOWING A MEAL) 90 tablet 0  . mirabegron ER (MYRBETRIQ) 50 MG TB24 tablet Take 1 tablet (50 mg total) by mouth daily. 28 tablet 0  . montelukast (SINGULAIR) 10 MG tablet Take 1 tablet (10 mg total) by mouth at bedtime. 90 tablet 3  . omeprazole (PRILOSEC) 40 MG capsule TAKE 1 CAPSULE BY MOUTH ONCE DAILY . APPOINTMENT REQUIRED FOR FUTURE REFILLS 90 capsule 1  . oxybutynin (DITROPAN-XL) 10 MG 24 hr tablet Take 1 tablet (10 mg total) by mouth daily. 30 tablet 5  . tamsulosin (FLOMAX) 0.4 MG CAPS capsule Take 1 capsule (0.4 mg total) by mouth daily. 90 capsule 3  . valsartan (DIOVAN) 80 MG tablet Take 1 tablet (80 mg total) by mouth daily. Must schedule appointment for further refills. 1st attempt. 30 tablet 1   No current facility-administered medications for this visit.   Allergies: No Known Allergies I reviewed his past medical history, social history, family history, and environmental history and no significant changes have been reported from his previous visit.  Review of Systems  Constitutional:  Negative for  appetite change, chills, fever and unexpected weight change.  HENT:  Positive for congestion and postnasal drip. Negative for rhinorrhea.   Eyes:  Negative for itching.  Respiratory:  Positive for wheezing. Negative for cough, chest tightness and shortness of breath.   Cardiovascular:  Negative for chest pain.  Gastrointestinal:  Negative for abdominal pain.  Genitourinary:  Negative for difficulty urinating.  Skin:  Negative for rash.  Allergic/Immunologic:  Positive for environmental allergies. Negative for food allergies.  Neurological:  Negative for headaches.   Objective: There were no vitals taken for this visit. There is no height or weight on file to calculate BMI. Physical Exam Vitals and nursing note reviewed.  Constitutional:      Appearance: Normal appearance. He is well-developed. He is obese.  HENT:     Head: Normocephalic and atraumatic.     Right Ear: Tympanic membrane and external ear normal.     Left Ear: Tympanic membrane and external ear normal.     Nose: Nose normal.     Mouth/Throat:     Mouth: Mucous membranes are moist.     Pharynx: Oropharynx is clear.  Eyes:     Conjunctiva/sclera: Conjunctivae normal.  Cardiovascular:     Rate and Rhythm: Normal rate and regular rhythm.     Heart sounds: Normal heart sounds. No murmur heard.    No friction rub. No gallop.  Pulmonary:     Effort: Pulmonary effort is normal.     Breath sounds: Normal breath sounds. No wheezing, rhonchi or rales.  Musculoskeletal:     Cervical back: Neck supple.  Skin:    General: Skin is warm.     Findings: No rash.  Neurological:     Mental Status: He is alert and oriented to person, place, and time.  Psychiatric:        Behavior: Behavior normal.  Previous notes and tests were reviewed. The plan was reviewed with the patient/family, and all questions/concerned were addressed.  It was my pleasure to see Scott Buchanan today and participate in his care. Please feel free to contact me with any questions or concerns.  Sincerely,  Wyline Mood, DO Allergy & Immunology  [facility] office: [phone] [facility] office: [phone]

## 2021-11-21 ENCOUNTER — Ambulatory Visit (INDEPENDENT_AMBULATORY_CARE_PROVIDER_SITE_OTHER): Payer: 59 | Admitting: Allergy

## 2021-11-21 ENCOUNTER — Ambulatory Visit: Payer: Self-pay

## 2021-11-21 ENCOUNTER — Encounter: Payer: Self-pay | Admitting: Allergy

## 2021-11-21 VITALS — BP 110/78 | HR 82 | Temp 98.5°F

## 2021-11-21 DIAGNOSIS — J302 Other seasonal allergic rhinitis: Secondary | ICD-10-CM | POA: Diagnosis not present

## 2021-11-21 DIAGNOSIS — J4550 Severe persistent asthma, uncomplicated: Secondary | ICD-10-CM

## 2021-11-21 DIAGNOSIS — H1013 Acute atopic conjunctivitis, bilateral: Secondary | ICD-10-CM | POA: Diagnosis not present

## 2021-11-21 DIAGNOSIS — B354 Tinea corporis: Secondary | ICD-10-CM | POA: Diagnosis not present

## 2021-11-21 DIAGNOSIS — J309 Allergic rhinitis, unspecified: Secondary | ICD-10-CM

## 2021-11-21 DIAGNOSIS — Z8709 Personal history of other diseases of the respiratory system: Secondary | ICD-10-CM

## 2021-11-21 DIAGNOSIS — H1010 Acute atopic conjunctivitis, unspecified eye: Secondary | ICD-10-CM

## 2021-11-21 MED ORDER — KETOCONAZOLE 2 % EX CREA: 1.0000 | TOPICAL_CREAM | Freq: Every day | CUTANEOUS | 0 refills | Status: DC

## 2021-11-21 MED ORDER — EPINEPHRINE 0.3 MG/0.3ML IJ SOAJ: 0.3000 mg | INTRAMUSCULAR | 1 refills | Status: AC | PRN

## 2021-11-21 NOTE — Assessment & Plan Note (Signed)
Past history - Diagnosed with asthma over 5 years ago but worsening symptoms the last 2 months. 2022 spirometry shows some restriction with 10% and greater than 200 cc improvement in FEV1 post bronchodilator treatment.  Clinically feeling improved.  Started Dupixent Dec 2022. Interim history - well controlled. No prednisone.   Today's spirometry showed some restriction. . Daily controller medication(s): continue Breztri 2 puffs twice a day with spacer and rinse mouth afterwards. . Continue Dupixent injections at home every 2 weeks.  . Continue Singulair (montelukast) 10mg  daily at night. . May use albuterol rescue inhaler 2 puffs every 4 to 6 hours as needed for shortness of breath, chest tightness, coughing, and wheezing. May use albuterol rescue inhaler 2 puffs 5 to 15 minutes prior to strenuous physical activities. Monitor frequency of use.  . Get spirometry at next visit.

## 2021-11-21 NOTE — Assessment & Plan Note (Signed)
Past history - Perennial rhinoconjunctivitis symptoms for 20+ years with worsening in the spring and fall.  2022 skin testing showed: Positive to grass, ragweed, weed, mold, dust mites, cockroach. Interim history - started AIT on 09/12/2021 (G-RW-W-DM and Molds-CR) and tolerating it well.   Continue environmental control measures.  Continue allergy injections - received today.  On beta blocker.  Use over the counter antihistamines such as Zyrtec (cetirizine), Claritin (loratadine), Allegra (fexofenadine), or Xyzal (levocetirizine) daily as needed. May take twice a day during allergy flares. May switch antihistamines every few months.  Continue Singulair (montelukast) 10mg  daily at night.  Use Flonase (fluticasone) nasal spray 1 spray per nostril twice a day as needed for nasal congestion.   Use azelastine nasal spray 1-2 sprays per nostril twice a day as needed for runny nose/drainage.  Nasal saline spray (i.e., Simply Saline) or nasal saline lavage (i.e., NeilMed) is recommended as needed and prior to medicated nasal sprays.

## 2021-11-21 NOTE — Patient Instructions (Addendum)
Asthma: Daily controller medication(s): continue Breztri 2 puffs twice a day with spacer and rinse mouth afterwards. Continue Dupixent injections at home every 2 weeks.  Continue Singulair (montelukast) 10mg  daily at night. May use albuterol rescue inhaler 2 puffs every 4 to 6 hours as needed for shortness of breath, chest tightness, coughing, and wheezing. May use albuterol rescue inhaler 2 puffs 5 to 15 minutes prior to strenuous physical activities. Monitor frequency of use.  Asthma control goals:  Full participation in all desired activities (may need albuterol before activity) Albuterol use two times or less a week on average (not counting use with activity) Cough interfering with sleep two times or less a month Oral steroids no more than once a year No hospitalizations  Environmental allergies 2022 skin testing showed: Positive to grass, ragweed, weed, mold, dust mites, cockroach. Continue environmental control measures. Continue allergy injections.  Use over the counter antihistamines such as Zyrtec (cetirizine), Claritin (loratadine), Allegra (fexofenadine), or Xyzal (levocetirizine) daily as needed. May take twice a day during allergy flares. May switch antihistamines every few months. Continue Singulair (montelukast) 10mg  daily at night. Use Flonase (fluticasone) nasal spray 1 spray per nostril twice a day as needed for nasal congestion.  Use azelastine nasal spray 1-2 sprays per nostril twice a day as needed for runny nose/drainage. Nasal saline spray (i.e., Simply Saline) or nasal saline lavage (i.e., NeilMed) is recommended as needed and prior to medicated nasal sprays.  Ringworm Use ketoconazole cream once day until rash clears up.  Follow up in 6 months or sooner if needed.

## 2021-11-21 NOTE — Assessment & Plan Note (Signed)
.   Use ketoconazole cream once day until rash clears up.

## 2021-12-01 ENCOUNTER — Other Ambulatory Visit: Payer: Self-pay | Admitting: Family Medicine

## 2021-12-01 DIAGNOSIS — Z9109 Other allergy status, other than to drugs and biological substances: Secondary | ICD-10-CM

## 2021-12-01 DIAGNOSIS — I10 Essential (primary) hypertension: Secondary | ICD-10-CM

## 2021-12-02 ENCOUNTER — Ambulatory Visit (INDEPENDENT_AMBULATORY_CARE_PROVIDER_SITE_OTHER): Payer: 59 | Admitting: *Deleted

## 2021-12-02 ENCOUNTER — Telehealth: Payer: Self-pay

## 2021-12-02 DIAGNOSIS — J309 Allergic rhinitis, unspecified: Secondary | ICD-10-CM | POA: Diagnosis not present

## 2021-12-02 DIAGNOSIS — I10 Essential (primary) hypertension: Secondary | ICD-10-CM

## 2021-12-02 MED ORDER — AMLODIPINE BESYLATE 10 MG PO TABS: 10.0000 mg | ORAL_TABLET | Freq: Every day | ORAL | 0 refills | Status: DC

## 2021-12-02 NOTE — Telephone Encounter (Signed)
No additional notes needed  

## 2021-12-03 ENCOUNTER — Telehealth: Payer: Self-pay

## 2021-12-03 DIAGNOSIS — E669 Obesity, unspecified: Secondary | ICD-10-CM

## 2021-12-03 DIAGNOSIS — J3089 Other allergic rhinitis: Secondary | ICD-10-CM

## 2021-12-03 MED ORDER — OXYBUTYNIN CHLORIDE ER 10 MG PO TB24: 10.0000 mg | ORAL_TABLET | Freq: Every day | ORAL | 5 refills | Status: DC

## 2021-12-03 MED ORDER — MONTELUKAST SODIUM 10 MG PO TABS: 10.0000 mg | ORAL_TABLET | Freq: Every day | ORAL | 3 refills | Status: DC

## 2021-12-03 NOTE — Telephone Encounter (Signed)
No additional notes

## 2021-12-09 ENCOUNTER — Encounter: Payer: Self-pay | Admitting: Allergy

## 2021-12-09 MED ORDER — KETOCONAZOLE 2 % EX CREA
1.0000 | TOPICAL_CREAM | Freq: Every day | CUTANEOUS | 0 refills | Status: DC
Start: 2021-12-09 — End: 2022-03-25

## 2021-12-10 ENCOUNTER — Ambulatory Visit (INDEPENDENT_AMBULATORY_CARE_PROVIDER_SITE_OTHER): Payer: 59 | Admitting: *Deleted

## 2021-12-10 DIAGNOSIS — J309 Allergic rhinitis, unspecified: Secondary | ICD-10-CM | POA: Diagnosis not present

## 2021-12-18 ENCOUNTER — Ambulatory Visit (INDEPENDENT_AMBULATORY_CARE_PROVIDER_SITE_OTHER): Payer: 59 | Admitting: *Deleted

## 2021-12-18 ENCOUNTER — Other Ambulatory Visit: Payer: Self-pay | Admitting: Physician Assistant

## 2021-12-18 DIAGNOSIS — J309 Allergic rhinitis, unspecified: Secondary | ICD-10-CM | POA: Diagnosis not present

## 2021-12-24 ENCOUNTER — Ambulatory Visit (INDEPENDENT_AMBULATORY_CARE_PROVIDER_SITE_OTHER): Payer: Self-pay | Admitting: *Deleted

## 2021-12-24 ENCOUNTER — Ambulatory Visit (INDEPENDENT_AMBULATORY_CARE_PROVIDER_SITE_OTHER): Payer: 59 | Admitting: Family Medicine

## 2021-12-24 ENCOUNTER — Encounter: Payer: Self-pay | Admitting: Family Medicine

## 2021-12-24 VITALS — BP 139/84 | HR 80 | Temp 97.7°F | Ht 76.0 in | Wt 328.2 lb

## 2021-12-24 DIAGNOSIS — M25562 Pain in left knee: Secondary | ICD-10-CM

## 2021-12-24 DIAGNOSIS — E669 Obesity, unspecified: Secondary | ICD-10-CM | POA: Diagnosis not present

## 2021-12-24 DIAGNOSIS — I10 Essential (primary) hypertension: Secondary | ICD-10-CM

## 2021-12-24 DIAGNOSIS — E1169 Type 2 diabetes mellitus with other specified complication: Secondary | ICD-10-CM

## 2021-12-24 DIAGNOSIS — R0989 Other specified symptoms and signs involving the circulatory and respiratory systems: Secondary | ICD-10-CM | POA: Diagnosis not present

## 2021-12-24 DIAGNOSIS — J309 Allergic rhinitis, unspecified: Secondary | ICD-10-CM

## 2021-12-24 LAB — POCT URINALYSIS DIP (CLINITEK)
Bilirubin, UA: NEGATIVE
Blood, UA: NEGATIVE
Glucose, UA: NEGATIVE mg/dL
Ketones, POC UA: NEGATIVE mg/dL
Leukocytes, UA: NEGATIVE
Nitrite, UA: NEGATIVE
POC PROTEIN,UA: 30 — AB
Spec Grav, UA: 1.02 (ref 1.010–1.025)
Urobilinogen, UA: 0.2 E.U./dL
pH, UA: 5.5 (ref 5.0–8.0)

## 2021-12-24 LAB — POCT GLYCOSYLATED HEMOGLOBIN (HGB A1C)
HbA1c POC (<> result, manual entry): 6.4 % (ref 4.0–5.6)
HbA1c, POC (controlled diabetic range): 6.4 % (ref 0.0–7.0)
HbA1c, POC (prediabetic range): 6.4 % (ref 5.7–6.4)
Hemoglobin A1C: 6.4 % — AB (ref 4.0–5.6)

## 2021-12-24 MED ORDER — MELOXICAM 7.5 MG PO TABS: 7.5000 mg | ORAL_TABLET | Freq: Every day | ORAL | 0 refills | Status: DC

## 2021-12-24 MED ORDER — AZITHROMYCIN 250 MG PO TABS: ORAL_TABLET | ORAL | 0 refills | Status: AC

## 2021-12-24 NOTE — Progress Notes (Signed)
Patient [facility]  Established Patient Office Visit  Subjective   Patient ID: Scott Buchanan, male    DOB: 05-11-1965  Age: 56 y.o. MRN: [id number]  Chief Complaint  Patient presents with   Follow-up    Pt is here for 3 month DM follow up visit. Pt states he has nasal congestions for about a week requesting medication for it also has LT knee pain     Emeril Buchanan is a very pleasant 56 year old male with a medical history consistent of type 2 diabetes mellitus, obesity, hypertension, hyperlipidemia, severe persistent asthma, and environmental allergies that presented to clinic for follow-up of chronic conditions. Patient states that he has mostly been doing well with the exception of sinusitis.  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  Patient also endorses runny nose and drainage primarily in AM.  He has been taking over-the-counter medications with some relief. Patient has a history of type 2 diabetes mellitus.  Patient's most recent hemoglobin A1c was 6.7, which remains at goal.  Patient has not been following a low-fat, low carbohydrate diet.  He remains very active, but no intentional exercise.  He denies polyuria, polydipsia, or polyphagia.  No dizziness or blurred vision.  Patient is up-to-date with eye exam.  Patient checks his feet daily. History of hypertension.  Patient does not check blood pressures at home.  He has been taking all medications consistently.  He denies any lower extremity swelling, chest pain, heart palpitations, or shortness of breath.    Patient Active Problem List   Diagnosis Date Noted   Tinea corporis 11/21/2021   Severe persistent asthma without complication 41/66/0630   Seasonal and perennial allergic rhinoconjunctivitis 03/18/2021   Asthma, not well controlled 10/30/2020   History of nasal  polyp 10/30/2020   Heartburn 10/30/2020   Angina pectoris ([place]) 09/04/2020   Atypical chest pain    Shortness of breath    Acute diarrhea 11/20/2019   Change in bowel habits 11/20/2019   Elevated LFTs 11/20/2019   Fatty liver 11/20/2019   History of esophagitis 11/20/2019   Gastritis 03/11/2019   Screening for viral disease 03/11/2019   Musculoskeletal pain 03/11/2019   Esophagitis 03/11/2019   LUQ pain 03/11/2019   Hav (hallux abducto valgus), right 12/21/2018   Hyperlipidemia associated with type 2 diabetes mellitus ([place]) 05/14/2018   Primary localized osteoarthritis of right knee 03/02/2018   Status post right partial knee replacement 03/02/2018   Morbid obesity ([place]) 07/27/2017   Type 2 diabetes mellitus without complication, without long-term current use of insulin ([place]) 05/12/2017   Chronic frontal sinusitis 01/02/2017   Essential hypertension 10/21/2016   Hyperlipidemia LDL goal <100 10/21/2016   Elevated serum glucose 10/21/2016   Vitamin D deficiency 10/21/2016   Need for Tdap vaccination 10/21/2016   Obesity, morbid, BMI 40.0-49.9 ([place]) 06/26/2015   Knee instability 12/15/2013   Seasonal allergies 11/09/2013   Past Medical History:  Diagnosis Date   Arthritis    Asthma    Diabetes mellitus without complication ([place])    type 2   Hypertension    Primary localized osteoarthritis of right knee 03/02/2018   Past Surgical History:  Procedure Laterality Date   CYSTECTOMY     tailbone   ETHMOIDECTOMY Bilateral 04/07/2017   Procedure: BILATERAL ETHMOIDECTOMY AND SPHENOIDECTOMY;  Surgeon: Leta Baptist, MD;  Location: [facility];  Service: ENT;  Laterality: Bilateral;   KNEE ARTHROSCOPY W/ MENISCAL REPAIR     right and left knee one year apart   LEFT HEART CATH AND CORONARY ANGIOGRAPHY N/A 09/04/2020   Procedure: LEFT HEART CATH AND CORONARY ANGIOGRAPHY;  Surgeon: Martinique, Peter M, MD;  Location: [facility];  Service: Cardiovascular;  Laterality: N/A;    PARTIAL KNEE ARTHROPLASTY Right 03/02/2018   Procedure: UNICOMPARTMENTAL KNEE;  Surgeon: Marchia Bond, MD;  Location: [facility];  Service: Orthopedics;  Laterality: Right;  with block   SINUS ENDO WITH FUSION Bilateral 04/07/2017   Procedure: BILATERAL FRONTAL RECESS SINUS EXPLORATION WITH SINUS FUSION NAVIGATION;  Surgeon: Leta Baptist, MD;  Location: [facility];  Service: ENT;  Laterality: Bilateral;   SINUS EXPLORATION     TONSILLECTOMY     TURBINATE REDUCTION Bilateral 04/07/2017   Procedure: BILATERAL TURBINATE REDUCTION;  Surgeon: Leta Baptist, MD;  Location: [facility];  Service: ENT;  Laterality: Bilateral;   unicompartmental right knee     Social History   Tobacco Use   Smoking status: Former    Types: Cigarettes   Smokeless tobacco: Never   Tobacco comments:    quit 10 years ago  Vaping Use   Vaping Use: Never used  Substance Use Topics   Alcohol use: No   Drug use: No   Social History   Socioeconomic History   Marital status: Single    Spouse name: Not on file   Number of children: 1   Years of education: Not on file   Highest education level: Not on file  Occupational History   Occupation: maintance   Tobacco Use   Smoking status: Former    Types: Cigarettes   Smokeless tobacco: Never   Tobacco comments:    quit 10 years ago  Vaping Use   Vaping Use: Never used  Substance and Sexual Activity   Alcohol use: No   Drug use: No   Sexual activity: Yes  Other Topics Concern   Not on file  Social History Narrative   Not on file   Social Determinants of Health   Financial Resource Strain: Not on file  Food Insecurity: Not on file  Transportation Needs: Not on file  Physical Activity: Not on file  Stress: Not on file  Social Connections: Not on file  Intimate Partner Violence: Not on file   Family Status  Relation Name Status   Mother  Deceased   Father  Deceased   Neg Hx  (Not Specified)   Family History  Problem Relation  Age of Onset   Hypertension Mother    Colon cancer Neg Hx    Colon polyps Neg Hx    Stomach cancer Neg Hx    Rectal cancer Neg Hx    Esophageal cancer Neg Hx    Inflammatory bowel disease Neg Hx    Liver disease Neg Hx    Pancreatic cancer Neg Hx    No Known Allergies    Review of Systems  Constitutional: Negative.   HENT: Negative.    Eyes: Negative.   Respiratory: Negative.    Cardiovascular: Negative.   Gastrointestinal: Negative.   Genitourinary: Negative.   Musculoskeletal:  Positive for joint pain.  Skin: Negative.   Neurological: Negative.   Psychiatric/Behavioral: Negative.        Objective:     BP 139/84 (BP Location: Left Arm, Patient Position: Sitting, Cuff Size: Large)   Pulse 80   Temp 97.7  F (36.5 C)   Ht '6\' 4"'  (1.93 m)   Wt (!) 328 lb 4 oz (148.9 kg)   SpO2 100%   BMI 39.96 kg/m  BP Readings from Last 3 Encounters:  12/24/21 139/84  11/21/21 110/78  09/19/21 138/89   Wt Readings from Last 3 Encounters:  12/24/21 (!) 328 lb 4 oz (148.9 kg)  09/19/21 (!) 327 lb 8 oz (148.6 kg)  08/20/21 (!) 326 lb (147.9 kg)      Physical Exam Constitutional:      Appearance: He is obese.  Eyes:     Pupils: Pupils are equal, round, and reactive to light.  Cardiovascular:     Rate and Rhythm: Normal rate and regular rhythm.     Pulses: Normal pulses.  Pulmonary:     Effort: Pulmonary effort is normal.  Abdominal:     General: Bowel sounds are normal.  Musculoskeletal:     Left knee: Swelling present. Decreased range of motion.  Skin:    General: Skin is warm.  Neurological:     General: No focal deficit present.     Mental Status: Mental status is at baseline.  Psychiatric:        Mood and Affect: Mood normal.        Behavior: Behavior normal.        Thought Content: Thought content normal.        Judgment: Judgment normal.     Results for orders placed or performed in visit on 12/24/21  POCT glycosylated hemoglobin (Hb A1C)  Result Value  Ref Range   Hemoglobin A1C 6.4 (A) 4.0 - 5.6 %   HbA1c POC (<> result, manual entry) 6.4 4.0 - 5.6 %   HbA1c, POC (prediabetic range) 6.4 5.7 - 6.4 %   HbA1c, POC (controlled diabetic range) 6.4 0.0 - 7.0 %    Last CBC Lab Results  Component Value Date   WBC 7.7 03/18/2021   HGB 14.3 03/18/2021   HCT 42.7 03/18/2021   MCV 85 03/18/2021   MCH 28.5 03/18/2021   RDW 13.4 03/18/2021   PLT 397 87/86/7672   Last metabolic panel Lab Results  Component Value Date   GLUCOSE 119 (H) 09/19/2021   NA 141 09/19/2021   K 4.8 09/19/2021   CL 103 09/19/2021   CO2 22 09/19/2021   BUN 11 09/19/2021   CREATININE 0.99 09/19/2021   EGFR 91 09/19/2021   CALCIUM 10.1 09/19/2021   PROT 7.0 09/19/2021   ALBUMIN 4.7 09/19/2021   LABGLOB 2.3 09/19/2021   AGRATIO 2.0 09/19/2021   BILITOT 0.5 09/19/2021   ALKPHOS 53 09/19/2021   AST 34 09/19/2021   ALT 58 (H) 09/19/2021   ANIONGAP 13 06/11/2020   Last lipids Lab Results  Component Value Date   CHOL 136 09/19/2021   HDL 33 (L) 09/19/2021   LDLCALC 79 09/19/2021   TRIG 131 09/19/2021   CHOLHDL 4.1 09/19/2021   Last hemoglobin A1c Lab Results  Component Value Date   HGBA1C 6.4 (A) 12/24/2021   HGBA1C 6.4 12/24/2021   HGBA1C 6.4 12/24/2021   HGBA1C 6.4 12/24/2021   Last thyroid functions Lab Results  Component Value Date   TSH 1.270 03/13/2020   T4TOTAL 6.5 11/28/2016   Last vitamin D Lab Results  Component Value Date   VD25OH 30.0 03/13/2020   Last vitamin B12 and Folate Lab Results  Component Value Date   CNOBSJGG83 662 12/13/2019      The 10-year ASCVD risk score (Arnett DK,  et al., 2019) is: 22%    Assessment & Plan:   Problem List Items Addressed This Visit       Cardiovascular and Mediastinum   Essential hypertension   Relevant Orders   Basic Metabolic Panel   Other Visit Diagnoses     Diabetes mellitus type 2 in obese ([place])    -  Primary   Relevant Orders   POCT glycosylated hemoglobin (Hb A1C)  (Completed)   Basic Metabolic Panel   Upper respiratory symptom       Relevant Medications   azithromycin (ZITHROMAX) 250 MG tablet      1. Diabetes mellitus type 2 in obese ([facility]) Hemoglobin A1c is 6.4, which is improved from previous.  No medication changes warranted.  Recommend carbohydrate modified diet divided over small meals throughout the day. Also, recommend low impact cardiovascular exercise 3 to 5 days/week. - POCT glycosylated hemoglobin (Hb G3R) - Basic Metabolic Panel - POCT URINALYSIS DIP (CLINITEK)  2. Upper respiratory symptom  - azithromycin (ZITHROMAX) 250 MG tablet; Take 2 tablets on day 1, then 1 tablet daily on days 2 through 5  Dispense: 6 tablet; Refill: 0  3. Essential hypertension BP 139/84 (BP Location: Left Arm, Patient Position: Sitting, Cuff Size: Large)   Pulse 80   Temp 97.7 F (36.5 C)   Ht '6\' 4"'  (1.93 m)   Wt (!) 328 lb 4 oz (148.9 kg)   SpO2 100%   BMI 39.96 kg/m   - Basic Metabolic Panel  4. Acute pain of left knee  - meloxicam (MOBIC) 7.5 MG tablet; Take 1 tablet (7.5 mg total) by mouth daily.  Dispense: 30 tablet; Refill: 0   Return in about 3 months (around 03/25/2022) for diabetes.   Donia Pounds  APRN, MSN, FNP-C Patient [facility] [address] [place], [place] [address] [phone]

## 2021-12-24 NOTE — Patient Instructions (Signed)
Acute Knee Pain, Adult Many things can cause knee pain. Sometimes, knee pain is sudden (acute) and may be caused by damage, swelling, or irritation of the muscles and tissues that support your knee. The pain often goes away on its own with time and rest. If the pain does not go away, tests may be done to find out what is causing the pain. Follow these instructions at home: If you have a knee sleeve or brace:  Wear the knee sleeve or brace as told by your doctor. Take it off only as told by your doctor. Loosen it if your toes: Tingle. Become numb. Turn cold and blue. Keep it clean. If the knee sleeve or brace is not waterproof: Do not let it get wet. Cover it with a watertight covering when you take a bath or shower. Activity Rest your knee. Do not do things that cause pain or make pain worse. Avoid activities where both feet leave the ground at the same time (high-impact activities). Examples are running, jumping rope, and doing jumping jacks. Work with a physical therapist to make a safe exercise program, as told by your doctor. Managing pain, stiffness, and swelling  If told, put ice on the knee. To do this: If you have a removable knee sleeve or brace, take it off as told by your doctor. Put ice in a plastic bag. Place a towel between your skin and the bag. Leave the ice on for 20 minutes, 2-3 times a day. Take off the ice if your skin turns bright red. This is very important. If you cannot feel pain, heat, or cold, you have a greater risk of damage to the area. If told, use an elastic bandage to put pressure (compression) on your injured knee. Raise your knee above the level of your heart while you are sitting or lying down. Sleep with a pillow under your knee. General instructions Take over-the-counter and prescription medicines only as told by your doctor. Do not smoke or use any products that contain nicotine or tobacco. If you need help quitting, ask your doctor. If you are  overweight, work with your doctor and a food expert (dietitian) to set goals to lose weight. Being overweight can make your knee hurt more. Watch for any changes in your symptoms. Keep all follow-up visits. Contact a doctor if: The knee pain does not stop. The knee pain changes or gets worse. You have a fever along with knee pain. Your knee is red or feels warm when you touch it. Your knee gives out or locks up. Get help right away if: Your knee swells, and the swelling gets worse. You cannot move your knee. You have very bad knee pain that does not get better with pain medicine. Summary Many things can cause knee pain. The pain often goes away on its own with time and rest. Your doctor may do tests to find out the cause of the pain. Watch for any changes in your symptoms. Relieve your pain with rest, medicines, light activity, and use of ice. Get help right away if you cannot move your knee or your knee pain is very bad. This information is not intended to replace advice given to you by your health care provider. Make sure you discuss any questions you have with your health care provider. Document Revised: 09/21/2019 Document Reviewed: 09/21/2019 Elsevier Patient Education  2023 Elsevier Inc.  

## 2021-12-25 LAB — BASIC METABOLIC PANEL
BUN/Creatinine Ratio: 13 (ref 9–20)
BUN: 12 mg/dL (ref 6–24)
CO2: 21 mmol/L (ref 20–29)
Calcium: 10.1 mg/dL (ref 8.7–10.2)
Chloride: 104 mmol/L (ref 96–106)
Creatinine, Ser: 0.94 mg/dL (ref 0.76–1.27)
Glucose: 182 mg/dL — ABNORMAL HIGH (ref 70–99)
Potassium: 4.7 mmol/L (ref 3.5–5.2)
Sodium: 142 mmol/L (ref 134–144)
eGFR: 96 mL/min/{1.73_m2} (ref >59)

## 2021-12-25 NOTE — Progress Notes (Signed)
Scott Buchanan is a 56 year old male with a medical history significant for type 2 diabetes mellitus, essential hypertension, hyperlipidemia, environmental allergies, moderate persistent asthma that was evaluated in clinic for chronic conditions on 12/24/2021.  All laboratory values were reviewed and consistent with his baseline.  Recommend that patient continues a carbohydrate modified diet divided over small meals throughout the day.  Also continue increasing daily physical activity as tolerated.  Follow-up as scheduled. Nolon Nations  APRN, MSN, FNP-C Patient [facility] [address] [place], [place] [address] [phone]

## 2021-12-27 ENCOUNTER — Telehealth: Payer: Self-pay

## 2021-12-27 NOTE — Telephone Encounter (Signed)
Breztri Aero 160-9-4.8 MCG/ACT PA...  KEYDietrich Pates ID: [id number] - Rx#: [id number] created: 12/27/21  PA has been sent [facility] for review.

## 2021-12-30 MED ORDER — BUDESONIDE-FORMOTEROL FUMARATE 160-4.5 MCG/ACT IN AERO: 2.0000 | INHALATION_SPRAY | Freq: Two times a day (BID) | RESPIRATORY_TRACT | 5 refills | Status: DC

## 2021-12-30 MED ORDER — SPIRIVA RESPIMAT 1.25 MCG/ACT IN AERS: 2.0000 | INHALATION_SPRAY | Freq: Every day | RESPIRATORY_TRACT | 5 refills | Status: DC

## 2021-12-30 NOTE — Telephone Encounter (Signed)
Please call patient and let him know that breztri is not covered.  Will send in Symbicort 2 puffs BID with spacer and rinse mouth afterwards and Spiriva 1.56mcg 2 puffs once a day.

## 2021-12-30 NOTE — Addendum Note (Signed)
Addended by: Ellamae Sia on: 12/30/2021 05:09 PM   Modules accepted: Orders

## 2021-12-30 NOTE — Telephone Encounter (Signed)
PA was denied for Scott Buchanan stating that preferred alternatives are Advair Diskus, Anoro, SYmbicort, Tiotropium Bromide/Respitat, Bevespi, and Yupelri. Please advise change in inhaler. Thank You.

## 2021-12-30 NOTE — Telephone Encounter (Signed)
Called and left a voicemail asking for patient to return call to inform.  

## 2021-12-31 NOTE — Telephone Encounter (Signed)
Patient called back and was informed. Patient verbalized understanding.  °

## 2022-01-01 ENCOUNTER — Ambulatory Visit (INDEPENDENT_AMBULATORY_CARE_PROVIDER_SITE_OTHER): Payer: Self-pay | Admitting: *Deleted

## 2022-01-01 DIAGNOSIS — J309 Allergic rhinitis, unspecified: Secondary | ICD-10-CM

## 2022-01-02 ENCOUNTER — Other Ambulatory Visit: Payer: Self-pay

## 2022-01-02 MED ORDER — METOPROLOL SUCCINATE ER 50 MG PO TB24: ORAL_TABLET | ORAL | 0 refills | Status: DC

## 2022-01-08 ENCOUNTER — Ambulatory Visit (INDEPENDENT_AMBULATORY_CARE_PROVIDER_SITE_OTHER): Payer: Self-pay | Admitting: *Deleted

## 2022-01-08 DIAGNOSIS — J309 Allergic rhinitis, unspecified: Secondary | ICD-10-CM

## 2022-01-14 ENCOUNTER — Other Ambulatory Visit: Payer: Self-pay | Admitting: Family Medicine

## 2022-01-14 DIAGNOSIS — I10 Essential (primary) hypertension: Secondary | ICD-10-CM

## 2022-01-15 ENCOUNTER — Ambulatory Visit (INDEPENDENT_AMBULATORY_CARE_PROVIDER_SITE_OTHER): Payer: Self-pay | Admitting: *Deleted

## 2022-01-15 ENCOUNTER — Telehealth: Payer: Self-pay

## 2022-01-15 DIAGNOSIS — J309 Allergic rhinitis, unspecified: Secondary | ICD-10-CM

## 2022-01-15 NOTE — Telephone Encounter (Signed)
PA for Trulicity submitted today, waiting on ins decision

## 2022-01-16 ENCOUNTER — Other Ambulatory Visit: Payer: Self-pay

## 2022-01-16 NOTE — Telephone Encounter (Signed)
PA for Trulicity approved until 01/16/23

## 2022-01-17 ENCOUNTER — Telehealth: Payer: Self-pay | Admitting: *Deleted

## 2022-01-17 NOTE — Telephone Encounter (Signed)
Advised patient was awaiting his new Ins info in order to get approval to send Rx to new pharmacy. He now has [place] will start aprpoval and reach out to patient

## 2022-01-17 NOTE — Telephone Encounter (Signed)
-----   Message from Scott Buchanan, [place] sent at 01/16/2022  3:57 PM EDT ----- Regarding: Dupixent Patient came in for his allergy injection yesterday and stated that [facility] won't deliver his Dupixent because the provider deleted or canceled the prescription.

## 2022-01-20 MED ORDER — DUPIXENT 300 MG/2ML ~~LOC~~ SOSY: 300.0000 mg | PREFILLED_SYRINGE | SUBCUTANEOUS | 11 refills | Status: DC

## 2022-01-20 NOTE — Telephone Encounter (Signed)
L/m for patient approval with new Ins and Rx now to [facility]. Also needs to make sure he has copay card inof to give to new pharmacy.

## 2022-01-20 NOTE — Addendum Note (Signed)
Addended by: Carin Hock on: 01/20/2022 11:33 AM   Modules accepted: Orders

## 2022-01-22 ENCOUNTER — Ambulatory Visit (INDEPENDENT_AMBULATORY_CARE_PROVIDER_SITE_OTHER): Payer: 59

## 2022-01-22 DIAGNOSIS — J309 Allergic rhinitis, unspecified: Secondary | ICD-10-CM

## 2022-01-27 ENCOUNTER — Other Ambulatory Visit: Payer: Self-pay | Admitting: Family Medicine

## 2022-01-27 ENCOUNTER — Other Ambulatory Visit: Payer: Self-pay | Admitting: Cardiology

## 2022-01-27 DIAGNOSIS — E1169 Type 2 diabetes mellitus with other specified complication: Secondary | ICD-10-CM

## 2022-01-29 ENCOUNTER — Ambulatory Visit (INDEPENDENT_AMBULATORY_CARE_PROVIDER_SITE_OTHER): Payer: 59

## 2022-01-29 DIAGNOSIS — J309 Allergic rhinitis, unspecified: Secondary | ICD-10-CM | POA: Diagnosis not present

## 2022-01-30 ENCOUNTER — Other Ambulatory Visit: Payer: Self-pay | Admitting: Cardiology

## 2022-01-30 MED ORDER — METOPROLOL SUCCINATE ER 50 MG PO TB24: 50.0000 mg | ORAL_TABLET | Freq: Every day | ORAL | 0 refills | Status: DC

## 2022-01-30 NOTE — Telephone Encounter (Signed)
Rx(s) sent to pharmacy electronically. Patient last seen 09/2020

## 2022-02-06 ENCOUNTER — Ambulatory Visit (INDEPENDENT_AMBULATORY_CARE_PROVIDER_SITE_OTHER): Payer: 59 | Admitting: *Deleted

## 2022-02-06 DIAGNOSIS — J309 Allergic rhinitis, unspecified: Secondary | ICD-10-CM

## 2022-02-12 ENCOUNTER — Ambulatory Visit (INDEPENDENT_AMBULATORY_CARE_PROVIDER_SITE_OTHER): Payer: 59 | Admitting: *Deleted

## 2022-02-12 DIAGNOSIS — J309 Allergic rhinitis, unspecified: Secondary | ICD-10-CM

## 2022-02-15 ENCOUNTER — Other Ambulatory Visit: Payer: Self-pay | Admitting: Cardiology

## 2022-02-19 ENCOUNTER — Ambulatory Visit (INDEPENDENT_AMBULATORY_CARE_PROVIDER_SITE_OTHER): Payer: 59

## 2022-02-19 DIAGNOSIS — J309 Allergic rhinitis, unspecified: Secondary | ICD-10-CM

## 2022-02-26 ENCOUNTER — Ambulatory Visit (INDEPENDENT_AMBULATORY_CARE_PROVIDER_SITE_OTHER): Payer: 59 | Admitting: *Deleted

## 2022-02-26 DIAGNOSIS — J309 Allergic rhinitis, unspecified: Secondary | ICD-10-CM

## 2022-03-03 ENCOUNTER — Other Ambulatory Visit: Payer: Self-pay | Admitting: Family Medicine

## 2022-03-03 ENCOUNTER — Other Ambulatory Visit: Payer: Self-pay | Admitting: Cardiology

## 2022-03-03 DIAGNOSIS — I10 Essential (primary) hypertension: Secondary | ICD-10-CM

## 2022-03-03 DIAGNOSIS — Z9109 Other allergy status, other than to drugs and biological substances: Secondary | ICD-10-CM

## 2022-03-04 MED ORDER — METOPROLOL SUCCINATE ER 50 MG PO TB24: 50.0000 mg | ORAL_TABLET | Freq: Every day | ORAL | 0 refills | Status: DC

## 2022-03-05 ENCOUNTER — Ambulatory Visit (INDEPENDENT_AMBULATORY_CARE_PROVIDER_SITE_OTHER): Payer: 59 | Admitting: *Deleted

## 2022-03-05 DIAGNOSIS — J309 Allergic rhinitis, unspecified: Secondary | ICD-10-CM

## 2022-03-07 ENCOUNTER — Other Ambulatory Visit: Payer: Self-pay | Admitting: Family Medicine

## 2022-03-07 DIAGNOSIS — K219 Gastro-esophageal reflux disease without esophagitis: Secondary | ICD-10-CM

## 2022-03-19 ENCOUNTER — Ambulatory Visit (INDEPENDENT_AMBULATORY_CARE_PROVIDER_SITE_OTHER): Payer: 59

## 2022-03-19 DIAGNOSIS — J309 Allergic rhinitis, unspecified: Secondary | ICD-10-CM | POA: Diagnosis not present

## 2022-03-25 ENCOUNTER — Ambulatory Visit: Payer: 59 | Admitting: Family Medicine

## 2022-03-25 ENCOUNTER — Ambulatory Visit (INDEPENDENT_AMBULATORY_CARE_PROVIDER_SITE_OTHER): Payer: 59

## 2022-03-25 ENCOUNTER — Encounter: Payer: Self-pay | Admitting: Family Medicine

## 2022-03-25 VITALS — BP 135/78 | HR 74 | Temp 98.2°F | Ht 76.0 in | Wt 328.6 lb

## 2022-03-25 DIAGNOSIS — I10 Essential (primary) hypertension: Secondary | ICD-10-CM

## 2022-03-25 DIAGNOSIS — E119 Type 2 diabetes mellitus without complications: Secondary | ICD-10-CM

## 2022-03-25 DIAGNOSIS — J309 Allergic rhinitis, unspecified: Secondary | ICD-10-CM | POA: Diagnosis not present

## 2022-03-25 DIAGNOSIS — E785 Hyperlipidemia, unspecified: Secondary | ICD-10-CM

## 2022-03-25 DIAGNOSIS — J321 Chronic frontal sinusitis: Secondary | ICD-10-CM | POA: Diagnosis not present

## 2022-03-25 LAB — POCT GLYCOSYLATED HEMOGLOBIN (HGB A1C): Hemoglobin A1C: 6.8 % — AB (ref 4.0–5.6)

## 2022-03-25 MED ORDER — AZITHROMYCIN 250 MG PO TABS: ORAL_TABLET | ORAL | 0 refills | Status: AC

## 2022-03-25 NOTE — Progress Notes (Signed)
Established Patient Office Visit  Subjective   Patient ID: Scott Buchanan, male    DOB: 1965-11-30  Age: 56 y.o. MRN: [id number]  Chief Complaint  Patient presents with   Diabetes    Follow up     Scott Buchanan is a very pleasant 56 year old male with a medical history significant for chronic sinusitis, type 2 diabetes mellitus, obesity, hypertension, hyperlipidemia, and severe persistent asthma presents for follow-up of chronic conditions.  Patient has a primary complaint of sinus pain and increased congestion over the past several days.  He attributes this to changes in weather.  Patient continues to utilize prescribed inhalers.  He is also using over-the-counter antihistamines to assist with this problem. Patient has a history of hypertension.  He has been taking his blood pressure medication consistently.  He does not check blood pressure at home.  He denies any lower extremity swelling, chest pain, heart palpitations, or shortness of breath. Patient has a history of type 2 diabetes mellitus.  He has not been following a low-fat, low carbohydrate diet.  Patient works a very physical job, but does not intentionally exercise.  He denies any blurry vision, polyuria, polydipsia, or polyphagia.  Patient is up-to-date with his eye exam.  He checks his feet daily.  Diabetes He presents for his follow-up diabetic visit. He has type 2 diabetes mellitus. His disease course has been stable. Pertinent negatives for diabetes include no blurred vision, no chest pain, no fatigue, no foot paresthesias, no foot ulcerations, no polydipsia, no polyphagia, no polyuria, no visual change, no weakness and no weight loss. He is compliant with treatment most of the time. He is following a generally unhealthy diet. When asked about meal planning, he reported none. He has not had a previous visit with a dietitian. He participates in exercise intermittently. He does not see a podiatrist.Eye exam is current.    Patient  Active Problem List   Diagnosis Date Noted   Tinea corporis 11/21/2021   Severe persistent asthma without complication 03/70/4888   Seasonal and perennial allergic rhinoconjunctivitis 03/18/2021   Asthma, not well controlled 10/30/2020   History of nasal polyp 10/30/2020   Heartburn 10/30/2020   Angina pectoris ([facility]) 09/04/2020   Atypical chest pain    Shortness of breath    Acute diarrhea 11/20/2019   Change in bowel habits 11/20/2019   Elevated LFTs 11/20/2019   Fatty liver 11/20/2019   History of esophagitis 11/20/2019   Gastritis 03/11/2019   Screening for viral disease 03/11/2019   Musculoskeletal pain 03/11/2019   Esophagitis 03/11/2019   LUQ pain 03/11/2019   Hav (hallux abducto valgus), right 12/21/2018   Hyperlipidemia associated with type 2 diabetes mellitus ([place]) 05/14/2018   Primary localized osteoarthritis of right knee 03/02/2018   Status post right partial knee replacement 03/02/2018   Morbid obesity ([place]) 07/27/2017   Type 2 diabetes mellitus without complication, without long-term current use of insulin ([place]) 05/12/2017   Chronic frontal sinusitis 01/02/2017   Essential hypertension 10/21/2016   Hyperlipidemia LDL goal <100 10/21/2016   Elevated serum glucose 10/21/2016   Vitamin D deficiency 10/21/2016   Need for Tdap vaccination 10/21/2016   Obesity, morbid, BMI 40.0-49.9 ([place]) 06/26/2015   Knee instability 12/15/2013   Seasonal allergies 11/09/2013   Past Medical History:  Diagnosis Date   Arthritis    Asthma    Diabetes mellitus without complication ([place])    type 2   Hypertension    Primary localized osteoarthritis of right knee 03/02/2018  Past Surgical History:  Procedure Laterality Date   CYSTECTOMY     tailbone   ETHMOIDECTOMY Bilateral 04/07/2017   Procedure: BILATERAL ETHMOIDECTOMY AND SPHENOIDECTOMY;  Surgeon: Leta Baptist, MD;  Location: [facility];  Service: ENT;  Laterality: Bilateral;   KNEE ARTHROSCOPY W/ MENISCAL REPAIR      right and left knee one year apart   LEFT HEART CATH AND CORONARY ANGIOGRAPHY N/A 09/04/2020   Procedure: LEFT HEART CATH AND CORONARY ANGIOGRAPHY;  Surgeon: Martinique, Peter M, MD;  Location: [facility];  Service: Cardiovascular;  Laterality: N/A;   PARTIAL KNEE ARTHROPLASTY Right 03/02/2018   Procedure: UNICOMPARTMENTAL KNEE;  Surgeon: Marchia Bond, MD;  Location: [facility];  Service: Orthopedics;  Laterality: Right;  with block   SINUS ENDO WITH FUSION Bilateral 04/07/2017   Procedure: BILATERAL FRONTAL RECESS SINUS EXPLORATION WITH SINUS FUSION NAVIGATION;  Surgeon: Leta Baptist, MD;  Location: [facility];  Service: ENT;  Laterality: Bilateral;   SINUS EXPLORATION     TONSILLECTOMY     TURBINATE REDUCTION Bilateral 04/07/2017   Procedure: BILATERAL TURBINATE REDUCTION;  Surgeon: Leta Baptist, MD;  Location: [facility];  Service: ENT;  Laterality: Bilateral;   unicompartmental right knee     Social History   Tobacco Use   Smoking status: Former    Types: Cigarettes   Smokeless tobacco: Never   Tobacco comments:    quit 10 years ago  Vaping Use   Vaping Use: Never used  Substance Use Topics   Alcohol use: No   Drug use: No   Social History   Socioeconomic History   Marital status: Single    Spouse name: Not on file   Number of children: 1   Years of education: Not on file   Highest education level: Not on file  Occupational History   Occupation: maintance   Tobacco Use   Smoking status: Former    Types: Cigarettes   Smokeless tobacco: Never   Tobacco comments:    quit 10 years ago  Vaping Use   Vaping Use: Never used  Substance and Sexual Activity   Alcohol use: No   Drug use: No   Sexual activity: Yes  Other Topics Concern   Not on file  Social History Narrative   Not on file   Social Determinants of Health   Financial Resource Strain: Not on file  Food Insecurity: Not on file  Transportation Needs: Not on file  Physical  Activity: Not on file  Stress: Not on file  Social Connections: Not on file  Intimate Partner Violence: Not on file   Family Status  Relation Name Status   Mother  Deceased   Father  Deceased   Neg Hx  (Not Specified)   Family History  Problem Relation Age of Onset   Hypertension Mother    Colon cancer Neg Hx    Colon polyps Neg Hx    Stomach cancer Neg Hx    Rectal cancer Neg Hx    Esophageal cancer Neg Hx    Inflammatory bowel disease Neg Hx    Liver disease Neg Hx    Pancreatic cancer Neg Hx    No Known Allergies    Review of Systems  Constitutional: Negative.  Negative for fatigue and weight loss.  HENT:  Positive for sinus pain. Negative for ear discharge, ear pain and sore throat.   Eyes: Negative.  Negative for blurred vision.  Respiratory: Negative.    Cardiovascular: Negative.  Negative for chest pain.  Gastrointestinal: Negative.   Genitourinary: Negative.   Musculoskeletal: Negative.   Skin: Negative.   Neurological: Negative.  Negative for weakness.  Endo/Heme/Allergies:  Negative for polydipsia and polyphagia.  Psychiatric/Behavioral: Negative.        Objective:     BP 135/78   Pulse 74   Temp 98.2 F (36.8 C)   Ht _0  (1.93 m)   Wt (!) 328 lb 9.6 oz (149.1 kg)   SpO2 98%   BMI 40.00 kg/m  BP Readings from Last 3 Encounters:  03/25/22 135/78  12/24/21 139/84  11/21/21 110/78   Wt Readings from Last 3 Encounters:  03/25/22 (!) 328 lb 9.6 oz (149.1 kg)  12/24/21 (!) 328 lb 4 oz (148.9 kg)  09/19/21 (!) 327 lb 8 oz (148.6 kg)      Physical Exam Constitutional:      Appearance: He is obese.  Eyes:     Pupils: Pupils are equal, round, and reactive to light.  Cardiovascular:     Rate and Rhythm: Normal rate and regular rhythm.     Pulses: Normal pulses.  Pulmonary:     Effort: Pulmonary effort is normal.  Abdominal:     General: Abdomen is flat. Bowel sounds are normal.  Musculoskeletal:        General: Normal range of motion.   Skin:    General: Skin is warm.  Neurological:     General: No focal deficit present.     Mental Status: He is alert. Mental status is at baseline.  Psychiatric:        Mood and Affect: Mood normal.        Behavior: Behavior normal.        Thought Content: Thought content normal.        Judgment: Judgment normal.     Results for orders placed or performed in visit on 03/25/22  POCT glycosylated hemoglobin (Hb A1C)  Result Value Ref Range   Hemoglobin A1C 6.8 (A) 4.0 - 5.6 %   HbA1c POC (<> result, manual entry)     HbA1c, POC (prediabetic range)     HbA1c, POC (controlled diabetic range)      Last CBC Lab Results  Component Value Date   WBC 7.7 03/18/2021   HGB 14.3 03/18/2021   HCT 42.7 03/18/2021   MCV 85 03/18/2021   MCH 28.5 03/18/2021   RDW 13.4 03/18/2021   PLT 397 44/96/7591   Last metabolic panel Lab Results  Component Value Date   GLUCOSE 182 (H) 12/24/2021   NA 142 12/24/2021   K 4.7 12/24/2021   CL 104 12/24/2021   CO2 21 12/24/2021   BUN 12 12/24/2021   CREATININE 0.94 12/24/2021   EGFR 96 12/24/2021   CALCIUM 10.1 12/24/2021   PROT 7.0 09/19/2021   ALBUMIN 4.7 09/19/2021   LABGLOB 2.3 09/19/2021   AGRATIO 2.0 09/19/2021   BILITOT 0.5 09/19/2021   ALKPHOS 53 09/19/2021   AST 34 09/19/2021   ALT 58 (H) 09/19/2021   ANIONGAP 13 06/11/2020   Last lipids Lab Results  Component Value Date   CHOL 136 09/19/2021   HDL 33 (L) 09/19/2021   LDLCALC 79 09/19/2021   TRIG 131 09/19/2021   CHOLHDL 4.1 09/19/2021   Last hemoglobin A1c Lab Results  Component Value Date   HGBA1C 6.8 (A) 03/25/2022   Last thyroid functions Lab Results  Component Value Date   TSH 1.270 03/13/2020   T4TOTAL 6.5 11/28/2016  Last vitamin D Lab Results  Component Value Date   VD25OH 30.0 03/13/2020   Last vitamin B12 and Folate Lab Results  Component Value Date   [id number] 092 12/13/2019      The 10-year ASCVD risk score (Arnett DK, et al., 2019) is:  21%    Assessment & Plan:   Problem List Items Addressed This Visit       Cardiovascular and Mediastinum   Essential hypertension     Endocrine   Type 2 diabetes mellitus without complication, without long-term current use of insulin ([place]) - Primary   Relevant Orders   Microalbumin/Creatinine Ratio, Urine   POCT glycosylated hemoglobin (Hb A1C) (Completed)     Other   Hyperlipidemia LDL goal <100    1. Type 2 diabetes mellitus without complication, without long-term current use of insulin ([facility]) Patient's hemoglobin A1c remains at goal.  A1c is less than 7.  Recommend that patient continues a carbohydrate modified diet.  No medication changes on today. - Microalbumin/Creatinine Ratio, Urine - POCT glycosylated hemoglobin (Hb A1C)  2. Hyperlipidemia LDL goal <100 The 10-year ASCVD risk score (Arnett DK, et al., 2019) is: 21%   Values used to calculate the score:     Age: 78 years     Sex: Male     Is Non-Hispanic African American: No     Diabetic: Yes     Tobacco smoker: Yes     Systolic Blood Pressure: 957 mmHg     Is BP treated: Yes     HDL Cholesterol: 33 mg/dL     Total Cholesterol: 136 mg/dL   3. Essential hypertension BP 135/78   Pulse 74   Temp 98.2 F (36.8 C)   Ht _0  (1.93 m)   Wt (!) 328 lb 9.6 oz (149.1 kg)   SpO2 98%   BMI 40.00 kg/m  - Continue medication, monitor blood pressure at home. Continue DASH diet.  Reminder to go to the ER if any CP, SOB, nausea, dizziness, severe HA, changes vision/speech, left arm numbness and tingling and jaw pain.    4. Chronic frontal sinusitis - azithromycin (ZITHROMAX) 250 MG tablet; Take 2 tablets on day 1, then 1 tablet daily on days 2 through 5  Dispense: 6 tablet; Refill: 0   Return in about 3 months (around 06/24/2022) for obesity, hypertension, hyperlipidemia.    Donia Pounds  APRN, MSN, FNP-C [facility] [address] [place], [place]  [address] [phone]

## 2022-03-27 LAB — MICROALBUMIN / CREATININE URINE RATIO
Creatinine, Urine: 95.1 mg/dL
Microalb/Creat Ratio: 79 mg/g creat — ABNORMAL HIGH (ref 0–29)
Microalbumin, Urine: 75.3 ug/mL

## 2022-03-29 ENCOUNTER — Other Ambulatory Visit: Payer: Self-pay | Admitting: Cardiology

## 2022-04-01 MED ORDER — METOPROLOL SUCCINATE ER 50 MG PO TB24: 50.0000 mg | ORAL_TABLET | Freq: Every day | ORAL | 0 refills | Status: DC

## 2022-04-02 ENCOUNTER — Ambulatory Visit (INDEPENDENT_AMBULATORY_CARE_PROVIDER_SITE_OTHER): Payer: 59

## 2022-04-02 DIAGNOSIS — J309 Allergic rhinitis, unspecified: Secondary | ICD-10-CM | POA: Diagnosis not present

## 2022-04-08 ENCOUNTER — Other Ambulatory Visit: Payer: Self-pay | Admitting: Family Medicine

## 2022-04-08 ENCOUNTER — Ambulatory Visit (INDEPENDENT_AMBULATORY_CARE_PROVIDER_SITE_OTHER): Payer: 59

## 2022-04-08 DIAGNOSIS — E1169 Type 2 diabetes mellitus with other specified complication: Secondary | ICD-10-CM

## 2022-04-08 DIAGNOSIS — J309 Allergic rhinitis, unspecified: Secondary | ICD-10-CM | POA: Diagnosis not present

## 2022-04-15 ENCOUNTER — Other Ambulatory Visit: Payer: Self-pay | Admitting: Family Medicine

## 2022-04-15 DIAGNOSIS — E889 Metabolic disorder, unspecified: Secondary | ICD-10-CM

## 2022-04-19 ENCOUNTER — Other Ambulatory Visit: Payer: Self-pay | Admitting: Family Medicine

## 2022-04-19 DIAGNOSIS — E1169 Type 2 diabetes mellitus with other specified complication: Secondary | ICD-10-CM

## 2022-04-19 NOTE — Telephone Encounter (Signed)
From: [name] To: Office of Julianne Handler, [place] Sent: 04/14/2022 9:21 AM EST Subject: Medication Renewal Request  Refills have been requested for the following medications:   TRULICITY 0.75 MG/0.5ML SOPN [Scott Buchanan]  Preferred pharmacy: [facility] - [place] ([place]), [place] - [address] Delivery method: [employer]

## 2022-04-22 ENCOUNTER — Ambulatory Visit (INDEPENDENT_AMBULATORY_CARE_PROVIDER_SITE_OTHER): Payer: 59

## 2022-04-22 ENCOUNTER — Other Ambulatory Visit: Payer: Self-pay

## 2022-04-22 DIAGNOSIS — J309 Allergic rhinitis, unspecified: Secondary | ICD-10-CM

## 2022-04-22 DIAGNOSIS — I10 Essential (primary) hypertension: Secondary | ICD-10-CM

## 2022-04-22 MED ORDER — HYDROCHLOROTHIAZIDE 12.5 MG PO TABS: 12.5000 mg | ORAL_TABLET | Freq: Every day | ORAL | 0 refills | Status: DC

## 2022-05-06 ENCOUNTER — Ambulatory Visit (INDEPENDENT_AMBULATORY_CARE_PROVIDER_SITE_OTHER): Payer: 59

## 2022-05-06 DIAGNOSIS — J309 Allergic rhinitis, unspecified: Secondary | ICD-10-CM | POA: Diagnosis not present

## 2022-05-09 ENCOUNTER — Other Ambulatory Visit: Payer: Self-pay

## 2022-05-09 DIAGNOSIS — E1169 Type 2 diabetes mellitus with other specified complication: Secondary | ICD-10-CM

## 2022-05-09 DIAGNOSIS — E889 Metabolic disorder, unspecified: Secondary | ICD-10-CM

## 2022-05-09 MED ORDER — DULAGLUTIDE 0.75 MG/0.5ML ~~LOC~~ SOAJ: 0.7500 mg | SUBCUTANEOUS | 1 refills | Status: DC

## 2022-05-09 MED ORDER — TRULICITY 0.75 MG/0.5ML ~~LOC~~ SOAJ: SUBCUTANEOUS | 0 refills | Status: DC

## 2022-05-09 NOTE — Telephone Encounter (Signed)
From: Ralph Dowdy To: Office of Cammie Sickle, [place] Sent: 05/08/2022 4:46 PM EST Subject: Medication Renewal Request  Refills have been requested for the following medications:   TRULICITY 3.74 MO/7.0BE SOPN [Lachina Hollis]   gabapentin (NEURONTIN) 300 MG capsule [Olugbemiga Jegede]  Preferred pharmacy: [facility] ([place]), [place] - [address] Delivery method: [employer]

## 2022-05-09 NOTE — Telephone Encounter (Signed)
Trulicity was sent in. [facility]

## 2022-05-12 ENCOUNTER — Other Ambulatory Visit: Payer: Self-pay | Admitting: Internal Medicine

## 2022-05-12 ENCOUNTER — Other Ambulatory Visit: Payer: Self-pay

## 2022-05-12 DIAGNOSIS — E1169 Type 2 diabetes mellitus with other specified complication: Secondary | ICD-10-CM

## 2022-05-12 DIAGNOSIS — E889 Metabolic disorder, unspecified: Secondary | ICD-10-CM

## 2022-05-12 MED ORDER — METFORMIN HCL 1000 MG PO TABS: ORAL_TABLET | ORAL | 0 refills | Status: DC

## 2022-05-20 ENCOUNTER — Ambulatory Visit (INDEPENDENT_AMBULATORY_CARE_PROVIDER_SITE_OTHER): Payer: 59

## 2022-05-20 DIAGNOSIS — J309 Allergic rhinitis, unspecified: Secondary | ICD-10-CM | POA: Diagnosis not present

## 2022-05-27 ENCOUNTER — Ambulatory Visit: Payer: Self-pay | Admitting: Allergy

## 2022-06-02 ENCOUNTER — Other Ambulatory Visit: Payer: Self-pay | Admitting: Family Medicine

## 2022-06-02 DIAGNOSIS — E785 Hyperlipidemia, unspecified: Secondary | ICD-10-CM

## 2022-06-03 ENCOUNTER — Ambulatory Visit (INDEPENDENT_AMBULATORY_CARE_PROVIDER_SITE_OTHER): Payer: 59

## 2022-06-03 DIAGNOSIS — J309 Allergic rhinitis, unspecified: Secondary | ICD-10-CM

## 2022-06-04 ENCOUNTER — Other Ambulatory Visit: Payer: Self-pay

## 2022-06-04 DIAGNOSIS — I10 Essential (primary) hypertension: Secondary | ICD-10-CM

## 2022-06-04 DIAGNOSIS — Z9109 Other allergy status, other than to drugs and biological substances: Secondary | ICD-10-CM

## 2022-06-04 MED ORDER — LEVOCETIRIZINE DIHYDROCHLORIDE 5 MG PO TABS: 5.0000 mg | ORAL_TABLET | Freq: Every evening | ORAL | 0 refills | Status: DC

## 2022-06-04 MED ORDER — AMLODIPINE BESYLATE 10 MG PO TABS: 10.0000 mg | ORAL_TABLET | Freq: Every day | ORAL | 0 refills | Status: DC

## 2022-06-10 ENCOUNTER — Ambulatory Visit: Payer: Self-pay | Admitting: Allergy

## 2022-06-10 NOTE — Progress Notes (Deleted)
Follow Up Note  RE: Scott Buchanan MRN: [id number] DOB: [birth year] Date of Office Visit: 06/10/2022  Referring provider: Dorena Dew, FNP Primary care provider: Dorena Dew, FNP  Chief Complaint: No chief complaint on file.  History of Present Illness: I had the pleasure of seeing Scott Buchanan for a follow up visit at the [facility] on 06/10/2022. He is a 57 y.o. male, who is being followed for asthma, allergic rhinitis on AIT. His previous allergy office visit was on 12/08/2021 with Dr. Maudie Mercury. Today is a regular follow up visit.  evere persistent asthma without complication Past history - Diagnosed with asthma over 5 years ago but worsening symptoms the last 2 months. 2022 spirometry shows some restriction with 10% and greater than 200 cc improvement in FEV1 post bronchodilator treatment.  Clinically feeling improved.  Started Dupixent Dec 2022. Interim history - well controlled. No prednisone.  Today's spirometry showed some restriction. Daily controller medication(s): continue Breztri 2 puffs twice a day with spacer and rinse mouth afterwards. Continue Dupixent injections at home every 2 weeks.  Continue Singulair (montelukast) 80m daily at night. May use albuterol rescue inhaler 2 puffs every 4 to 6 hours as needed for shortness of breath, chest tightness, coughing, and wheezing. May use albuterol rescue inhaler 2 puffs 5 to 15 minutes prior to strenuous physical activities. Monitor frequency of use.  Get spirometry at next visit.   Seasonal and perennial allergic rhinoconjunctivitis Past history - Perennial rhinoconjunctivitis symptoms for 20+ years with worsening in the spring and fall.  2022 skin testing showed: Positive to grass, ragweed, weed, mold, dust mites, cockroach. Interim history - started AIT on 09/12/2021 (G-RW-W-DM and Molds-CR) and tolerating it well.  Continue environmental control measures. Continue allergy injections - received  today. On beta blocker. Use over the counter antihistamines such as Zyrtec (cetirizine), Claritin (loratadine), Allegra (fexofenadine), or Xyzal (levocetirizine) daily as needed. May take twice a day during allergy flares. May switch antihistamines every few months. Continue Singulair (montelukast) 140mdaily at night. Use Flonase (fluticasone) nasal spray 1 spray per nostril twice a day as needed for nasal congestion.  Use azelastine nasal spray 1-2 sprays per nostril twice a day as needed for runny nose/drainage. Nasal saline spray (i.e., Simply Saline) or nasal saline lavage (i.e., NeilMed) is recommended as needed and prior to medicated nasal sprays.  Assessment and Plan: DaFenners a [age].o. male with: No problem-specific Assessment & Plan notes found for this encounter.  No follow-ups on file.  No orders of the defined types were placed in this encounter.  Lab Orders  No laboratory test(s) ordered today    Diagnostics: Spirometry:  Tracings reviewed. His effort: {Blank single:19197::"Good reproducible efforts.","It was hard to get consistent efforts and there is a question as to whether this reflects a maximal maneuver.","Poor effort, data can not be interpreted."} FVC: ***L FEV1: ***L, ***% predicted FEV1/FVC ratio: ***% Interpretation: {Blank single:19197::"Spirometry consistent with mild obstructive disease","Spirometry consistent with moderate obstructive disease","Spirometry consistent with severe obstructive disease","Spirometry consistent with possible restrictive disease","Spirometry consistent with mixed obstructive and restrictive disease","Spirometry uninterpretable due to technique","Spirometry consistent with normal pattern","No overt abnormalities noted given today's efforts"}.  Please see scanned spirometry results for details.  Skin Testing: {Blank single:19197::"Select foods","Environmental allergy panel","Environmental allergy panel and select foods","Food allergy  panel","None","Deferred due to recent antihistamines use"}. *** Results discussed with patient/family.   Medication List:  Current Outpatient Medications  Medication Sig Dispense Refill   Dulaglutide 0.75 MG/0.5ML SOPN  Inject 0.75 mg into the skin once a week. 6 mL 1   acetaminophen (TYLENOL) 650 MG CR tablet Take 1,300 mg by mouth every 8 (eight) hours as needed for pain.     albuterol (VENTOLIN HFA) 108 (90 Base) MCG/ACT inhaler Inhale 2 puffs into the lungs every 4 (four) hours as needed for wheezing or shortness of breath. 18 g 3   amLODipine (NORVASC) 10 MG tablet Take 1 tablet (10 mg total) by mouth daily. 90 tablet 0   Ascorbic Acid (VITAMIN C) 1000 MG tablet Take 1,000 mg by mouth daily.     aspirin EC 81 MG tablet Take 81 mg by mouth daily.     atorvastatin (LIPITOR) 40 MG tablet Take 1 tablet by mouth once daily 90 tablet 0   budesonide-formoterol (SYMBICORT) 160-4.5 MCG/ACT inhaler Inhale 2 puffs into the lungs in the morning and at bedtime. with spacer and rinse mouth afterwards. 1 each 5   cholecalciferol (VITAMIN D) 1000 units tablet Take 1,000 Units by mouth daily.     EPINEPHrine 0.3 mg/0.3 mL IJ SOAJ injection Inject 0.3 mg into the muscle as needed for anaphylaxis. (Patient not taking: Reported on 03/25/2022) 2 each 1   fluticasone (FLONASE) 50 MCG/ACT nasal spray USE 2 SPRAY(S) IN EACH NOSTRIL AT BEDTIME 16 g 0   gabapentin (NEURONTIN) 300 MG capsule Take 1 capsule by mouth at bedtime 30 capsule 0   hydrochlorothiazide (HYDRODIURIL) 12.5 MG tablet Take 1 tablet (12.5 mg total) by mouth daily. 90 tablet 0   levocetirizine (XYZAL) 5 MG tablet Take 1 tablet (5 mg total) by mouth every evening. 90 tablet 0   metFORMIN (GLUCOPHAGE) 1000 MG tablet TAKE 1 TABLET BY MOUTH TWICE DAILY WITH A MEAL . APPOINTMENT REQUIRED FOR FUTURE REFILLS 180 tablet 0   metoprolol succinate (TOPROL-XL) 50 MG 24 hr tablet Take 1 tablet (50 mg total) by mouth daily. Take with or immediately following a  meal. 15 tablet 0   montelukast (SINGULAIR) 10 MG tablet Take 1 tablet (10 mg total) by mouth at bedtime. 90 tablet 3   omeprazole (PRILOSEC) 40 MG capsule TAKE 1 CAPSULE BY MOUTH ONCE DAILY . APPOINTMENT REQUIRED FOR FUTURE REFILLS 90 capsule 0   oxybutynin (DITROPAN-XL) 10 MG 24 hr tablet Take 1 tablet (10 mg total) by mouth daily. 30 tablet 5   tamsulosin (FLOMAX) 0.4 MG CAPS capsule Take 1 capsule (0.4 mg total) by mouth daily. 90 capsule 3   Tiotropium Bromide Monohydrate (SPIRIVA RESPIMAT) 1.25 MCG/ACT AERS Inhale 2 puffs into the lungs daily. 4 g 5   No current facility-administered medications for this visit.   Allergies: No Known Allergies I reviewed his past medical history, social history, family history, and environmental history and no significant changes have been reported from his previous visit.  Review of Systems  Constitutional:  Negative for appetite change, chills, fever and unexpected weight change.  HENT:  Negative for congestion, postnasal drip and rhinorrhea.   Eyes:  Negative for itching.  Respiratory:  Negative for cough, chest tightness, shortness of breath and wheezing.   Cardiovascular:  Negative for chest pain.  Gastrointestinal:  Negative for abdominal pain.  Genitourinary:  Negative for difficulty urinating.  Skin:  Positive for rash.  Allergic/Immunologic: Positive for environmental allergies. Negative for food allergies.  Neurological:  Negative for headaches.    Objective: There were no vitals taken for this visit. There is no height or weight on file to calculate BMI. Physical Exam Vitals and nursing note  reviewed.  Constitutional:      Appearance: Normal appearance. He is well-developed. He is obese.  HENT:     Head: Normocephalic and atraumatic.     Right Ear: Tympanic membrane and external ear normal.     Left Ear: Tympanic membrane and external ear normal.     Nose: Nose normal.     Mouth/Throat:     Mouth: Mucous membranes are moist.      Pharynx: Oropharynx is clear.     Comments: Poor dentition Eyes:     Conjunctiva/sclera: Conjunctivae normal.  Cardiovascular:     Rate and Rhythm: Normal rate and regular rhythm.     Heart sounds: Normal heart sounds. No murmur heard.    No friction rub. No gallop.  Pulmonary:     Effort: Pulmonary effort is normal.     Breath sounds: Normal breath sounds. No wheezing, rhonchi or rales.  Musculoskeletal:     Cervical back: Neck supple.  Skin:    General: Skin is warm.     Findings: Rash present.     Comments: 2 circular rash on left upper arm  Neurological:     Mental Status: He is alert and oriented to person, place, and time.  Psychiatric:        Behavior: Behavior normal.    Previous notes and tests were reviewed. The plan was reviewed with the patient/family, and all questions/concerned were addressed.  It was my pleasure to see Micahi today and participate in his care. Please feel free to contact me with any questions or concerns.  Sincerely,  Rexene Alberts, DO Allergy & Immunology  [facility] office: [place] office: [phone]

## 2022-06-11 ENCOUNTER — Other Ambulatory Visit: Payer: Self-pay

## 2022-06-11 DIAGNOSIS — J3089 Other allergic rhinitis: Secondary | ICD-10-CM

## 2022-06-11 DIAGNOSIS — K219 Gastro-esophageal reflux disease without esophagitis: Secondary | ICD-10-CM

## 2022-06-11 MED ORDER — OMEPRAZOLE 40 MG PO CPDR: DELAYED_RELEASE_CAPSULE | ORAL | 0 refills | Status: DC

## 2022-06-11 NOTE — Telephone Encounter (Signed)
From: Ralph Dowdy To: Office of Cammie Sickle, [place] Sent: 06/10/2022 8:10 PM EST Subject: Medication Renewal Request  Refills have been requested for the following medications:   omeprazole (PRILOSEC) 40 MG capsule [Lachina Hollis]  Preferred pharmacy: [facility] ([place]), [place] - [address] Delivery method: [employer]

## 2022-06-17 ENCOUNTER — Ambulatory Visit (INDEPENDENT_AMBULATORY_CARE_PROVIDER_SITE_OTHER): Payer: 59

## 2022-06-17 DIAGNOSIS — J309 Allergic rhinitis, unspecified: Secondary | ICD-10-CM

## 2022-06-24 ENCOUNTER — Encounter: Payer: Self-pay | Admitting: Family Medicine

## 2022-06-24 ENCOUNTER — Ambulatory Visit (INDEPENDENT_AMBULATORY_CARE_PROVIDER_SITE_OTHER): Payer: 59

## 2022-06-24 ENCOUNTER — Ambulatory Visit (INDEPENDENT_AMBULATORY_CARE_PROVIDER_SITE_OTHER): Payer: Self-pay | Admitting: Family Medicine

## 2022-06-24 VITALS — BP 159/93 | HR 85 | Temp 97.5°F | Ht 76.0 in | Wt 330.6 lb

## 2022-06-24 DIAGNOSIS — E785 Hyperlipidemia, unspecified: Secondary | ICD-10-CM

## 2022-06-24 DIAGNOSIS — J0111 Acute recurrent frontal sinusitis: Secondary | ICD-10-CM

## 2022-06-24 DIAGNOSIS — G63 Polyneuropathy in diseases classified elsewhere: Secondary | ICD-10-CM

## 2022-06-24 DIAGNOSIS — K219 Gastro-esophageal reflux disease without esophagitis: Secondary | ICD-10-CM

## 2022-06-24 DIAGNOSIS — J309 Allergic rhinitis, unspecified: Secondary | ICD-10-CM | POA: Diagnosis not present

## 2022-06-24 DIAGNOSIS — B370 Candidal stomatitis: Secondary | ICD-10-CM

## 2022-06-24 DIAGNOSIS — E119 Type 2 diabetes mellitus without complications: Secondary | ICD-10-CM

## 2022-06-24 DIAGNOSIS — I10 Essential (primary) hypertension: Secondary | ICD-10-CM

## 2022-06-24 DIAGNOSIS — E889 Metabolic disorder, unspecified: Secondary | ICD-10-CM

## 2022-06-24 LAB — POCT URINALYSIS DIPSTICK
Bilirubin, UA: NEGATIVE
Glucose, UA: NEGATIVE
Ketones, UA: NEGATIVE
Nitrite, UA: POSITIVE
Protein, UA: POSITIVE — AB
Spec Grav, UA: 1.02 (ref 1.010–1.025)
Urobilinogen, UA: 0.2 E.U./dL
pH, UA: 6 (ref 5.0–8.0)

## 2022-06-24 LAB — POCT GLYCOSYLATED HEMOGLOBIN (HGB A1C): Hemoglobin A1C: 8.2 % — AB (ref 4.0–5.6)

## 2022-06-24 MED ORDER — AMOXICILLIN-POT CLAVULANATE 875-125 MG PO TABS: 1.0000 | ORAL_TABLET | Freq: Two times a day (BID) | ORAL | 0 refills | Status: DC

## 2022-06-24 MED ORDER — NYSTATIN 100000 UNIT/ML MT SUSP: 5.0000 mL | Freq: Four times a day (QID) | OROMUCOSAL | 0 refills | Status: AC

## 2022-06-24 MED ORDER — GABAPENTIN 300 MG PO CAPS: 300.0000 mg | ORAL_CAPSULE | Freq: Every day | ORAL | 1 refills | Status: DC

## 2022-06-24 MED ORDER — OMEPRAZOLE 40 MG PO CPDR: DELAYED_RELEASE_CAPSULE | ORAL | 1 refills | Status: DC

## 2022-06-24 NOTE — Progress Notes (Signed)
Established Patient Office Visit  Subjective   Patient ID: Scott Buchanan, male    DOB: [birth year]  Age: 57 y.o. MRN: [id number]    Diabetes He has type 2 diabetes mellitus. His disease course has been stable. Pertinent negatives for diabetes include no blurred vision, no chest pain, no fatigue, no foot paresthesias, no foot ulcerations, no polydipsia, no polyphagia, no polyuria and no visual change. Symptoms are stable.    Patient Active Problem List   Diagnosis Date Noted  . Tinea corporis 11/21/2021  . Severe persistent asthma without complication [id number]  . Seasonal and perennial allergic rhinoconjunctivitis 03/18/2021  . Asthma, not well controlled 10/30/2020  . History of nasal polyp 10/30/2020  . Heartburn 10/30/2020  . Angina pectoris ([place]) 09/04/2020  . Atypical chest pain   . Shortness of breath   . Acute diarrhea 11/20/2019  . Change in bowel habits 11/20/2019  . Elevated LFTs 11/20/2019  . Fatty liver 11/20/2019  . History of esophagitis 11/20/2019  . Gastritis 03/11/2019  . Screening for viral disease 03/11/2019  . Musculoskeletal pain 03/11/2019  . Esophagitis 03/11/2019  . LUQ pain 03/11/2019  . Hav (hallux abducto valgus), right 12/21/2018  . Hyperlipidemia associated with type 2 diabetes mellitus ([place]) 05/14/2018  . Primary localized osteoarthritis of right knee 03/02/2018  . Status post right partial knee replacement 03/02/2018  . Morbid obesity ([place]) 07/27/2017  . Type 2 diabetes mellitus without complication, without long-term current use of insulin ([place]) 05/12/2017  . Chronic frontal sinusitis 01/02/2017  . Essential hypertension 10/21/2016  . Hyperlipidemia LDL goal <100 10/21/2016  . Elevated serum glucose 10/21/2016  . Vitamin D deficiency 10/21/2016  . Need for Tdap vaccination 10/21/2016  . Obesity, morbid, BMI 40.0-49.9 ([place]) 06/26/2015  . Knee instability 12/15/2013  . Seasonal allergies 11/09/2013   Past Medical History:  Diagnosis  Date  . Arthritis   . Asthma   . Diabetes mellitus without complication ([place])    type 2  . Hypertension   . Primary localized osteoarthritis of right knee 03/02/2018   Past Surgical History:  Procedure Laterality Date  . CYSTECTOMY     tailbone  . ETHMOIDECTOMY Bilateral 04/07/2017   Procedure: BILATERAL ETHMOIDECTOMY AND SPHENOIDECTOMY;  Surgeon: Leta Baptist, MD;  Location: [facility];  Service: ENT;  Laterality: Bilateral;  . KNEE ARTHROSCOPY W/ MENISCAL REPAIR     right and left knee one year apart  . LEFT HEART CATH AND CORONARY ANGIOGRAPHY N/A 09/04/2020   Procedure: LEFT HEART CATH AND CORONARY ANGIOGRAPHY;  Surgeon: Martinique, Peter M, MD;  Location: [facility];  Service: Cardiovascular;  Laterality: N/A;  . PARTIAL KNEE ARTHROPLASTY Right 03/02/2018   Procedure: UNICOMPARTMENTAL KNEE;  Surgeon: Marchia Bond, MD;  Location: [facility];  Service: Orthopedics;  Laterality: Right;  with block  . SINUS ENDO WITH FUSION Bilateral 04/07/2017   Procedure: BILATERAL FRONTAL RECESS SINUS EXPLORATION WITH SINUS FUSION NAVIGATION;  Surgeon: Leta Baptist, MD;  Location: [facility];  Service: ENT;  Laterality: Bilateral;  . SINUS EXPLORATION    . TONSILLECTOMY    . TURBINATE REDUCTION Bilateral 04/07/2017   Procedure: BILATERAL TURBINATE REDUCTION;  Surgeon: Leta Baptist, MD;  Location: [facility];  Service: ENT;  Laterality: Bilateral;  . unicompartmental right knee     Social History   Tobacco Use  . Smoking status: Former    Types: Cigarettes  . Smokeless tobacco: Never  . Tobacco comments:    quit 10 years  ago  Vaping Use  . Vaping Use: Never used  Substance Use Topics  . Alcohol use: No  . Drug use: No   Social History   Socioeconomic History  . Marital status: Single    Spouse name: Not on file  . Number of children: 1  . Years of education: Not on file  . Highest education level: Not on file  Occupational History  . Occupation:  maintance   Tobacco Use  . Smoking status: Former    Types: Cigarettes  . Smokeless tobacco: Never  . Tobacco comments:    quit 10 years ago  Vaping Use  . Vaping Use: Never used  Substance and Sexual Activity  . Alcohol use: No  . Drug use: No  . Sexual activity: Yes  Other Topics Concern  . Not on file  Social History Narrative  . Not on file   Social Determinants of Health   Financial Resource Strain: Not on file  Food Insecurity: Not on file  Transportation Needs: Not on file  Physical Activity: Not on file  Stress: Not on file  Social Connections: Not on file  Intimate Partner Violence: Not on file   Family Status  Relation Name Status  . Mother  Deceased  . Father  Deceased  . Neg Hx  (Not Specified)   Family History  Problem Relation Age of Onset  . Hypertension Mother   . Colon cancer Neg Hx   . Colon polyps Neg Hx   . Stomach cancer Neg Hx   . Rectal cancer Neg Hx   . Esophageal cancer Neg Hx   . Inflammatory bowel disease Neg Hx   . Liver disease Neg Hx   . Pancreatic cancer Neg Hx    No Known Allergies    Review of Systems  Constitutional: Negative.  Negative for fatigue.  HENT: Negative.    Eyes: Negative.  Negative for blurred vision.  Respiratory: Negative.    Cardiovascular: Negative.  Negative for chest pain.  Gastrointestinal: Negative.   Genitourinary: Negative.   Musculoskeletal:  Positive for back pain and joint pain.  Skin: Negative.   Neurological: Negative.   Endo/Heme/Allergies: Negative.  Negative for polydipsia and polyphagia.  Psychiatric/Behavioral: Negative.        Objective:     There were no vitals taken for this visit. BP Readings from Last 3 Encounters:  03/25/22 135/78  12/24/21 139/84  11/21/21 110/78   Wt Readings from Last 3 Encounters:  03/25/22 (!) 328 lb 9.6 oz (149.1 kg)  12/24/21 (!) 328 lb 4 oz (148.9 kg)  09/19/21 (!) 327 lb 8 oz (148.6 kg)      Physical Exam Constitutional:      Appearance:  Normal appearance. He is obese.  Eyes:     Pupils: Pupils are equal, round, and reactive to light.  Cardiovascular:     Rate and Rhythm: Normal rate and regular rhythm.     Pulses: Normal pulses.  Abdominal:     General: Bowel sounds are normal.  Musculoskeletal:        General: Normal range of motion.  Skin:    General: Skin is warm.  Neurological:     General: No focal deficit present.     Mental Status: He is alert. Mental status is at baseline.  Psychiatric:        Mood and Affect: Mood normal.        Behavior: Behavior normal.        Thought Content: Thought  content normal.        Judgment: Judgment normal.     No results found for any visits on 06/24/22.  Last CBC Lab Results  Component Value Date   WBC 7.7 03/18/2021   HGB 14.3 03/18/2021   HCT 42.7 03/18/2021   MCV 85 03/18/2021   MCH 28.5 03/18/2021   RDW 13.4 03/18/2021   PLT 397 [id number]   Last metabolic panel Lab Results  Component Value Date   GLUCOSE 182 (H) 12/24/2021   NA 142 12/24/2021   K 4.7 12/24/2021   CL 104 12/24/2021   CO2 21 12/24/2021   BUN 12 12/24/2021   CREATININE 0.94 12/24/2021   EGFR 96 12/24/2021   CALCIUM 10.1 12/24/2021   PROT 7.0 09/19/2021   ALBUMIN 4.7 09/19/2021   LABGLOB 2.3 09/19/2021   AGRATIO 2.0 09/19/2021   BILITOT 0.5 09/19/2021   ALKPHOS 53 09/19/2021   AST 34 09/19/2021   ALT 58 (H) 09/19/2021   ANIONGAP 13 06/11/2020   Last lipids Lab Results  Component Value Date   CHOL 136 09/19/2021   HDL 33 (L) 09/19/2021   LDLCALC 79 09/19/2021   TRIG 131 09/19/2021   CHOLHDL 4.1 09/19/2021   Last hemoglobin A1c Lab Results  Component Value Date   HGBA1C 6.8 (A) 03/25/2022   Last thyroid functions Lab Results  Component Value Date   TSH 1.270 03/13/2020   T4TOTAL 6.5 11/28/2016   Last vitamin D Lab Results  Component Value Date   VD25OH 30.0 03/13/2020   Last vitamin B12 and Folate Lab Results  Component Value Date   [id number] 12/13/2019       The 10-year ASCVD risk score (Arnett DK, et al., 2019) is: 21%    Assessment & Plan:   Problem List Items Addressed This Visit       Cardiovascular and Mediastinum   Essential hypertension   Relevant Orders   HgB A1c   Urinalysis Dipstick   Basic Metabolic Panel     Endocrine   Type 2 diabetes mellitus without complication, without long-term current use of insulin ([place]) - Primary   Relevant Orders   HgB A1c   Urinalysis Dipstick   Basic Metabolic Panel     Other   Morbid obesity ([place])   Hyperlipidemia LDL goal <100    Return in about 3 months (around 09/24/2022) for obesity, diabetes, hypertension, hyperlipidemia.  Donia Pounds  APRN, MSN, FNP-C [facility] [address] [place], [place] [address] [phone]

## 2022-06-25 LAB — BASIC METABOLIC PANEL
BUN/Creatinine Ratio: 13 (ref 9–20)
BUN: 11 mg/dL (ref 6–24)
CO2: 20 mmol/L (ref 20–29)
Calcium: 10.2 mg/dL (ref 8.7–10.2)
Chloride: 100 mmol/L (ref 96–106)
Creatinine, Ser: 0.88 mg/dL (ref 0.76–1.27)
Glucose: 170 mg/dL — ABNORMAL HIGH (ref 70–99)
Potassium: 4.4 mmol/L (ref 3.5–5.2)
Sodium: 140 mmol/L (ref 134–144)
eGFR: 102 mL/min/{1.73_m2} (ref >59)

## 2022-07-01 ENCOUNTER — Ambulatory Visit (INDEPENDENT_AMBULATORY_CARE_PROVIDER_SITE_OTHER): Payer: 59

## 2022-07-01 DIAGNOSIS — J309 Allergic rhinitis, unspecified: Secondary | ICD-10-CM | POA: Diagnosis not present

## 2022-07-01 NOTE — Progress Notes (Signed)
VIALS EXP 07-01-23

## 2022-07-02 DIAGNOSIS — J3089 Other allergic rhinitis: Secondary | ICD-10-CM

## 2022-07-09 ENCOUNTER — Ambulatory Visit (INDEPENDENT_AMBULATORY_CARE_PROVIDER_SITE_OTHER): Payer: 59

## 2022-07-09 DIAGNOSIS — J309 Allergic rhinitis, unspecified: Secondary | ICD-10-CM

## 2022-07-11 ENCOUNTER — Other Ambulatory Visit: Payer: Self-pay | Admitting: Urology

## 2022-07-11 DIAGNOSIS — R351 Nocturia: Secondary | ICD-10-CM

## 2022-07-14 ENCOUNTER — Other Ambulatory Visit: Payer: Self-pay | Admitting: Family Medicine

## 2022-07-14 DIAGNOSIS — I10 Essential (primary) hypertension: Secondary | ICD-10-CM

## 2022-07-15 ENCOUNTER — Other Ambulatory Visit: Payer: Self-pay | Admitting: Nurse Practitioner

## 2022-07-15 ENCOUNTER — Other Ambulatory Visit: Payer: Self-pay | Admitting: Urology

## 2022-07-15 ENCOUNTER — Ambulatory Visit (INDEPENDENT_AMBULATORY_CARE_PROVIDER_SITE_OTHER): Payer: 59

## 2022-07-15 DIAGNOSIS — R351 Nocturia: Secondary | ICD-10-CM

## 2022-07-15 DIAGNOSIS — J309 Allergic rhinitis, unspecified: Secondary | ICD-10-CM | POA: Diagnosis not present

## 2022-07-23 ENCOUNTER — Ambulatory Visit (INDEPENDENT_AMBULATORY_CARE_PROVIDER_SITE_OTHER): Payer: 59

## 2022-07-23 DIAGNOSIS — J309 Allergic rhinitis, unspecified: Secondary | ICD-10-CM

## 2022-07-30 ENCOUNTER — Ambulatory Visit (INDEPENDENT_AMBULATORY_CARE_PROVIDER_SITE_OTHER): Payer: 59

## 2022-07-30 ENCOUNTER — Ambulatory Visit: Payer: 59 | Admitting: Urology

## 2022-07-30 DIAGNOSIS — J309 Allergic rhinitis, unspecified: Secondary | ICD-10-CM | POA: Diagnosis not present

## 2022-08-06 ENCOUNTER — Ambulatory Visit (INDEPENDENT_AMBULATORY_CARE_PROVIDER_SITE_OTHER): Payer: 59

## 2022-08-06 DIAGNOSIS — J309 Allergic rhinitis, unspecified: Secondary | ICD-10-CM

## 2022-08-12 ENCOUNTER — Ambulatory Visit (INDEPENDENT_AMBULATORY_CARE_PROVIDER_SITE_OTHER): Payer: 59

## 2022-08-12 DIAGNOSIS — J309 Allergic rhinitis, unspecified: Secondary | ICD-10-CM | POA: Diagnosis not present

## 2022-08-13 ENCOUNTER — Other Ambulatory Visit: Payer: Self-pay

## 2022-08-13 DIAGNOSIS — E119 Type 2 diabetes mellitus without complications: Secondary | ICD-10-CM

## 2022-08-13 DIAGNOSIS — E669 Obesity, unspecified: Secondary | ICD-10-CM

## 2022-08-13 MED ORDER — METFORMIN HCL 1000 MG PO TABS: ORAL_TABLET | ORAL | 0 refills | Status: DC

## 2022-08-14 ENCOUNTER — Ambulatory Visit (INDEPENDENT_AMBULATORY_CARE_PROVIDER_SITE_OTHER): Payer: 59 | Admitting: Urology

## 2022-08-14 ENCOUNTER — Encounter: Payer: Self-pay | Admitting: Urology

## 2022-08-14 VITALS — BP 193/92 | HR 102 | Ht 76.0 in | Wt 330.0 lb

## 2022-08-14 DIAGNOSIS — N3943 Post-void dribbling: Secondary | ICD-10-CM | POA: Diagnosis not present

## 2022-08-14 DIAGNOSIS — R351 Nocturia: Secondary | ICD-10-CM

## 2022-08-14 DIAGNOSIS — N401 Enlarged prostate with lower urinary tract symptoms: Secondary | ICD-10-CM | POA: Diagnosis not present

## 2022-08-14 DIAGNOSIS — R8281 Pyuria: Secondary | ICD-10-CM | POA: Diagnosis not present

## 2022-08-14 LAB — URINALYSIS, COMPLETE
Bilirubin, UA: NEGATIVE
Ketones, UA: NEGATIVE
Nitrite, UA: NEGATIVE
Specific Gravity, UA: 1.02 (ref 1.005–1.030)
Urobilinogen, Ur: 0.2 mg/dL (ref 0.2–1.0)
pH, UA: 5.5 (ref 5.0–7.5)

## 2022-08-14 LAB — MICROSCOPIC EXAMINATION: WBC, UA: >30 /hpf — AB (ref 0–5)

## 2022-08-14 LAB — BLADDER SCAN AMB NON-IMAGING: Scan Result: 15

## 2022-08-14 MED ORDER — TAMSULOSIN HCL 0.4 MG PO CAPS: 0.4000 mg | ORAL_CAPSULE | Freq: Every day | ORAL | 3 refills | Status: DC

## 2022-08-14 NOTE — Progress Notes (Signed)
I, DeAsia L Maxie,acting as a scribe for Riki Altes, MD.,have documented all relevant documentation on the behalf of Riki Altes, MD,as directed by  Riki Altes, MD while in the presence of Riki Altes, MD.   Albin Fischer Abdulla,acting as a scribe for Riki Altes, MD.,have documented all relevant documentation on the behalf of Riki Altes, MD,as directed by  Riki Altes, MD while in the presence of Riki Altes, MD.   08/14/2022 1:04 PM   Scott Buchanan 12/23/1965 [id number]  Referring provider: Massie Maroon, FNP [address] [place],  [place] [address]  Chief Complaint  Patient presents with   Benign Prostatic Hypertrophy   Urologic History  1.BPH with LUTS -Managed on Tamsulosin 0.4 mg  HPI: 57 y.o. male presents for annual follow-up of BPH with LUTS.  Stable LUTS since last visit. IPSS today 11/35, with most bothersome symptoms urgency and nocturia x3. He does have sleep apnea and was set up to get a CPAP, however due to cost was unable to obtain  Denies dysuria, gross hematuria. Denies flank, abdominal or pelvic pain.   PMH: Past Medical History:  Diagnosis Date   Arthritis    Asthma    Diabetes mellitus without complication    type 2   Hypertension    Primary localized osteoarthritis of right knee 03/02/2018    Surgical History: Past Surgical History:  Procedure Laterality Date   CYSTECTOMY     tailbone   ETHMOIDECTOMY Bilateral 04/07/2017   Procedure: BILATERAL ETHMOIDECTOMY AND SPHENOIDECTOMY;  Surgeon: Newman Pies, MD;  Location: [facility];  Service: ENT;  Laterality: Bilateral;   KNEE ARTHROSCOPY W/ MENISCAL REPAIR     right and left knee one year apart   LEFT HEART CATH AND CORONARY ANGIOGRAPHY N/A 09/04/2020   Procedure: LEFT HEART CATH AND CORONARY ANGIOGRAPHY;  Surgeon: Swaz[place], Peter M, MD;  [facility];  Service: Cardiovascular;  Laterality: N/A;   PARTIAL KNEE ARTHROPLASTY  Right 03/02/2018   Procedure: UNICOMPARTMENTAL KNEE;  Surgeon: Teryl Lucy, MD;  [facility];  Service: Orthopedics;  Laterality: Right;  with block   SINUS ENDO WITH FUSION Bilateral 04/07/2017   Procedure: BILATERAL FRONTAL RECESS SINUS EXPLORATION WITH SINUS FUSION NAVIGATION;  Surgeon: Newman Pies, MD;  Location: [facility];  Service: ENT;  Laterality: Bilateral;   SINUS EXPLORATION     TONSILLECTOMY     TURBINATE REDUCTION Bilateral 04/07/2017   Procedure: BILATERAL TURBINATE REDUCTION;  Surgeon: Newman Pies, MD;  Location: [facility];  Service: ENT;  Laterality: Bilateral;   unicompartmental right knee       Family History: Family History  Problem Relation Age of Onset   Hypertension Mother    Colon cancer Neg Hx    Colon polyps Neg Hx    Stomach cancer Neg Hx    Rectal cancer Neg Hx    Esophageal cancer Neg Hx    Inflammatory bowel disease Neg Hx    Liver disease Neg Hx    Pancreatic cancer Neg Hx     Social History:  reports that he has quit smoking. His smoking use included cigarettes. He has never used smokeless tobacco. He reports that he does not drink alcohol and does not use drugs.   Physical Exam: BP (!) 193/92   Pulse (!) 102   Ht 6\' 4"  (1.93 m)   Wt (!) 330 lb (149.7 kg)  BMI 40.17 kg/m   Constitutional:  Alert and oriented, No acute distress. HEENT: [place] AT Respiratory: Normal respiratory effort, no increased work of breathing. Psychiatric: Normal mood and affect.  Urinalysis  Dipstick 3+ glucose/trace blood/2+ protein/1+ leukocytes Microscopy >30 WBC  Assessment & Plan:    1. BPH with LUTS Stable lower urinary tract symptoms on Tamsulosin, refills sent to pharmacy. PVR today 15 mL.  2. Nocturia We discussed untreated sleep apnea is a common cause of nocturia, and if he is subsequently able to obtain a CPAP, there is an excellent chance his nocturia will improve.  3. Pyuria Asymptomatic with normal PVR. Urine  C&S ordered in the event he develops symptoms.  I have reviewed the above documentation for accuracy and completeness, and I agree with the above.   Riki Altes, MD  [facility] [address], [address] [place], [place] [address] [phone]

## 2022-08-19 ENCOUNTER — Ambulatory Visit (INDEPENDENT_AMBULATORY_CARE_PROVIDER_SITE_OTHER): Payer: 59

## 2022-08-19 DIAGNOSIS — J309 Allergic rhinitis, unspecified: Secondary | ICD-10-CM | POA: Diagnosis not present

## 2022-08-19 LAB — CULTURE, URINE COMPREHENSIVE

## 2022-08-27 ENCOUNTER — Ambulatory Visit (INDEPENDENT_AMBULATORY_CARE_PROVIDER_SITE_OTHER): Payer: 59 | Admitting: *Deleted

## 2022-08-27 DIAGNOSIS — J309 Allergic rhinitis, unspecified: Secondary | ICD-10-CM | POA: Diagnosis not present

## 2022-09-01 ENCOUNTER — Other Ambulatory Visit: Payer: Self-pay

## 2022-09-01 DIAGNOSIS — Z9109 Other allergy status, other than to drugs and biological substances: Secondary | ICD-10-CM

## 2022-09-01 DIAGNOSIS — E785 Hyperlipidemia, unspecified: Secondary | ICD-10-CM

## 2022-09-01 DIAGNOSIS — I10 Essential (primary) hypertension: Secondary | ICD-10-CM

## 2022-09-01 MED ORDER — AMLODIPINE BESYLATE 10 MG PO TABS: 10.0000 mg | ORAL_TABLET | Freq: Every day | ORAL | 0 refills | Status: DC

## 2022-09-01 MED ORDER — FLUTICASONE PROPIONATE 50 MCG/ACT NA SUSP: 2.0000 | Freq: Every evening | NASAL | 0 refills | Status: DC

## 2022-09-01 MED ORDER — ATORVASTATIN CALCIUM 40 MG PO TABS: 40.0000 mg | ORAL_TABLET | Freq: Every day | ORAL | 0 refills | Status: DC

## 2022-09-01 MED ORDER — LEVOCETIRIZINE DIHYDROCHLORIDE 5 MG PO TABS
5.0000 mg | ORAL_TABLET | Freq: Every evening | ORAL | 0 refills | Status: DC
Start: 2022-09-01 — End: 2022-11-27

## 2022-09-01 NOTE — Telephone Encounter (Signed)
From: [name] To: Office of Julianne Handler, [place] Sent: 09/01/2022 9:16 AM EDT Subject: Medication Renewal Request  Refills have been requested for the following medications:   atorvastatin (LIPITOR) 40 MG tablet [Lachina Hollis]   amLODipine (NORVASC) 10 MG tablet [Lachina Hollis]   levocetirizine (XYZAL) 5 MG tablet [Lachina Hollis]  Preferred pharmacy: [id number] - [place] ([place]), [place] - [address] Delivery method: Pickup   Medication renewals requested in this message routed separately:   fluticasone (FLONASE) 50 MCG/ACT nasal spray [Tonya S Nichols]

## 2022-09-01 NOTE — Telephone Encounter (Signed)
From: [name] To: Office of Ivonne Andrew, NP Sent: 09/01/2022 9:16 AM EDT Subject: Medication Renewal Request  Refills have been requested for the following medications:   fluticasone (FLONASE) 50 MCG/ACT nasal spray [Tonya S Nichols]  Preferred pharmacy: [facility] - [place] ([place]), [place] - [address] Delivery method: Pickup   Medication renewals requested in this message routed separately:   atorvastatin (LIPITOR) 40 MG tablet [Lachina Hollis]   amLODipine (NORVASC) 10 MG tablet [Lachina Hollis]   levocetirizine (XYZAL) 5 MG tablet [Lachina Hollis]

## 2022-09-03 ENCOUNTER — Ambulatory Visit (INDEPENDENT_AMBULATORY_CARE_PROVIDER_SITE_OTHER): Payer: 59 | Admitting: *Deleted

## 2022-09-03 DIAGNOSIS — J309 Allergic rhinitis, unspecified: Secondary | ICD-10-CM

## 2022-09-09 ENCOUNTER — Ambulatory Visit (INDEPENDENT_AMBULATORY_CARE_PROVIDER_SITE_OTHER): Payer: 59

## 2022-09-09 DIAGNOSIS — J309 Allergic rhinitis, unspecified: Secondary | ICD-10-CM | POA: Diagnosis not present

## 2022-09-16 ENCOUNTER — Ambulatory Visit (INDEPENDENT_AMBULATORY_CARE_PROVIDER_SITE_OTHER): Payer: 59 | Admitting: *Deleted

## 2022-09-16 DIAGNOSIS — J309 Allergic rhinitis, unspecified: Secondary | ICD-10-CM | POA: Diagnosis not present

## 2022-09-17 DIAGNOSIS — J3089 Other allergic rhinitis: Secondary | ICD-10-CM

## 2022-09-17 NOTE — Progress Notes (Signed)
VIALS EXP 09-17-23 

## 2022-09-23 ENCOUNTER — Ambulatory Visit (INDEPENDENT_AMBULATORY_CARE_PROVIDER_SITE_OTHER): Payer: 59 | Admitting: *Deleted

## 2022-09-23 ENCOUNTER — Ambulatory Visit (INDEPENDENT_AMBULATORY_CARE_PROVIDER_SITE_OTHER): Payer: 59 | Admitting: Nurse Practitioner

## 2022-09-23 ENCOUNTER — Encounter: Payer: Self-pay | Admitting: Nurse Practitioner

## 2022-09-23 VITALS — BP 142/77 | HR 97 | Temp 97.7°F | Ht 76.0 in | Wt 321.4 lb

## 2022-09-23 DIAGNOSIS — E785 Hyperlipidemia, unspecified: Secondary | ICD-10-CM | POA: Diagnosis not present

## 2022-09-23 DIAGNOSIS — E1169 Type 2 diabetes mellitus with other specified complication: Secondary | ICD-10-CM | POA: Diagnosis not present

## 2022-09-23 DIAGNOSIS — J309 Allergic rhinitis, unspecified: Secondary | ICD-10-CM | POA: Diagnosis not present

## 2022-09-23 DIAGNOSIS — I10 Essential (primary) hypertension: Secondary | ICD-10-CM | POA: Diagnosis not present

## 2022-09-23 LAB — POCT GLYCOSYLATED HEMOGLOBIN (HGB A1C): Hemoglobin A1C: 8.5 % — AB (ref 4.0–5.6)

## 2022-09-23 MED ORDER — GLIPIZIDE ER 5 MG PO TB24
5.0000 mg | ORAL_TABLET | Freq: Every day | ORAL | 0 refills | Status: DC
Start: 2022-09-23 — End: 2022-11-19

## 2022-09-23 MED ORDER — METOPROLOL SUCCINATE ER 50 MG PO TB24: 50.0000 mg | ORAL_TABLET | Freq: Every day | ORAL | 0 refills | Status: DC

## 2022-09-23 MED ORDER — HYDROCHLOROTHIAZIDE 25 MG PO TABS
25.0000 mg | ORAL_TABLET | Freq: Every day | ORAL | 3 refills | Status: DC
Start: 2022-09-23 — End: 2022-09-23

## 2022-09-23 MED ORDER — HYDROCHLOROTHIAZIDE 12.5 MG PO TABS: 12.5000 mg | ORAL_TABLET | Freq: Every day | ORAL | 1 refills | Status: DC

## 2022-09-23 NOTE — Patient Instructions (Addendum)
Fasting labs in 2 weeks   1. Hyperlipidemia associated with type 2 diabetes mellitus (HCC)  - POCT glycosylated hemoglobin (Hb A1C) - glipiZIDE (GLUCOTROL XL) 5 MG 24 hr tablet; Take 1 tablet (5 mg total) by mouth daily with breakfast.  Dispense: 60 tablet; Refill: 0  Goal for fasting blood sugar ranges from 80 to 120 and 2 hours after any meal or at bedtime should be between 130 to 170.    2. Essential hypertension  - hydrochlorothiazide (HYDRODIURIL) 25 MG tablet; Take 1 tablet (25 mg total) by mouth daily.  Dispense: 90 tablet; Refill: 3    Around 3 times per week, check your blood pressure 2 times per day. once in the morning and once in the evening. The readings should be at least one minute apart. Write down these values and bring them to your next nurse visit/appointment.  When you check your BP, make sure you have been doing something calm/relaxing 5 minutes prior to checking. Both feet should be flat on the floor and you should be sitting. Use your left arm and make sure it is in a relaxed position (on a table), and that the cuff is at the approximate level/height of your heart.  Blood pressure goal is less than 130/80

## 2022-09-23 NOTE — Progress Notes (Signed)
Established Patient Office Visit  Subjective:  Patient ID: Scott Buchanan, male    DOB: [birth year]  Age: 57 y.o. MRN: [id number]  CC:  Chief Complaint  Patient presents with   Follow-up    HPI Scott Buchanan is a 57 y.o. male  has a past medical history of Arthritis, Asthma, Diabetes mellitus without complication (HCC), Hypertension, and Primary localized osteoarthritis of right knee (03/02/2018).  Patient presents for follow-up for his chronic medical conditions.  Type 2 diabetes.  Currently on metformin 1000 mg twice daily.  He was taking Trulicity 0.75 mg once weekly injection up till about 30 days ago.  He had stopped taking Trulicity due to not being able to afford the cost of the medication.  He does not monitor his blood sugar.  He denies polyuria polyphagia polydipsia. Does a lot of walking at work, also working on his diet.   Hypertension.  Currently on hydrochlorothiazide 12.5 mg daily, amlodipine 10 mg daily, patient answered yes to taking metoprolol 50 mg daily but this medication has not been refilled since Dec 2023.  He denies chest pain, dizziness, edema.  He does not check his blood pressure regularly at home.  States that his blood pressure is sometimes in the 140s over 80s     Past Medical History:  Diagnosis Date   Arthritis    Asthma    Diabetes mellitus without complication (HCC)    type 2   Hypertension    Primary localized osteoarthritis of right knee 03/02/2018    Past Surgical History:  Procedure Laterality Date   CYSTECTOMY     tailbone   ETHMOIDECTOMY Bilateral 04/07/2017   Procedure: BILATERAL ETHMOIDECTOMY AND SPHENOIDECTOMY;  Surgeon: Newman Pies, MD;  Location: [facility];  Service: ENT;  Laterality: Bilateral;   KNEE ARTHROSCOPY W/ MENISCAL REPAIR     right and left knee one year apart   LEFT HEART CATH AND CORONARY ANGIOGRAPHY N/A 09/04/2020   Procedure: LEFT HEART CATH AND CORONARY ANGIOGRAPHY;  Surgeon: [place], Peter M, MD;   Location: [facility];  Service: Cardiovascular;  Laterality: N/A;   PARTIAL KNEE ARTHROPLASTY Right 03/02/2018   Procedure: UNICOMPARTMENTAL KNEE;  Surgeon: Teryl Lucy, MD;  Location: [facility];  Service: Orthopedics;  Laterality: Right;  with block   SINUS ENDO WITH FUSION Bilateral 04/07/2017   Procedure: BILATERAL FRONTAL RECESS SINUS EXPLORATION WITH SINUS FUSION NAVIGATION;  Surgeon: Newman Pies, MD;  Location: [facility];  Service: ENT;  Laterality: Bilateral;   SINUS EXPLORATION     TONSILLECTOMY     TURBINATE REDUCTION Bilateral 04/07/2017   Procedure: BILATERAL TURBINATE REDUCTION;  Surgeon: Newman Pies, MD;  Location: [facility];  Service: ENT;  Laterality: Bilateral;   unicompartmental right knee      Family History  Problem Relation Age of Onset   Hypertension Mother    Colon cancer Neg Hx    Colon polyps Neg Hx    Stomach cancer Neg Hx    Rectal cancer Neg Hx    Esophageal cancer Neg Hx    Inflammatory bowel disease Neg Hx    Liver disease Neg Hx    Pancreatic cancer Neg Hx     Social History   Socioeconomic History   Marital status: Single    Spouse name: Not on file   Number of children: 1   Years of education: Not on file   Highest education level: Bachelor's degree (e.g., BA, AB, BS)  Occupational  History   Occupation: maintance   Tobacco Use   Smoking status: Former    Types: Cigarettes   Smokeless tobacco: Never   Tobacco comments:    quit 10 years ago  Vaping Use   Vaping Use: Never used  Substance and Sexual Activity   Alcohol use: No   Drug use: No   Sexual activity: Yes  Other Topics Concern   Not on file  Social History Narrative   Not on file   Social Determinants of Health   Financial Resource Strain: Low Risk  (09/22/2022)   Overall Financial Resource Strain (CARDIA)    Difficulty of Paying Living Expenses: Not very hard  Food Insecurity: No Food Insecurity (09/22/2022)   Hunger Vital Sign    Worried  About Running Out of Food in the Last Year: Never true    Ran Out of Food in the Last Year: Never true  Transportation Needs: No Transportation Needs (09/22/2022)   PRAPARE - Administrator, Civil Service (Medical): No    Lack of Transportation (Non-Medical): No  Physical Activity: Insufficiently Active (09/22/2022)   Exercise Vital Sign    Days of Exercise per Week: 5 days    Minutes of Exercise per Session: 10 min  Stress: No Stress Concern Present (09/22/2022)   [employer] of Occupational Health - Occupational Stress Questionnaire    Feeling of Stress : Not at all  Social Connections: Socially Isolated (09/22/2022)   Social Connection and Isolation Panel [NHANES]    Frequency of Communication with Friends and Family: Twice a week    Frequency of Social Gatherings with Friends and Family: Once a week    Attends Religious Services: Never    Database administrator or Organizations: No    Attends Engineer, structural: Not on file    Marital Status: Never married  Intimate Partner Violence: Not on file    Outpatient Medications Prior to Visit  Medication Sig Dispense Refill   acetaminophen (TYLENOL) 650 MG CR tablet Take 1,300 mg by mouth every 8 (eight) hours as needed for pain.     albuterol (VENTOLIN HFA) 108 (90 Base) MCG/ACT inhaler Inhale 2 puffs into the lungs every 4 (four) hours as needed for wheezing or shortness of breath. 18 g 3   amLODipine (NORVASC) 10 MG tablet Take 1 tablet (10 mg total) by mouth daily. 90 tablet 0   Ascorbic Acid (VITAMIN C) 1000 MG tablet Take 1,000 mg by mouth daily.     aspirin EC 81 MG tablet Take 81 mg by mouth daily.     atorvastatin (LIPITOR) 40 MG tablet Take 1 tablet (40 mg total) by mouth daily. 90 tablet 0   budesonide-formoterol (SYMBICORT) 160-4.5 MCG/ACT inhaler Inhale 2 puffs into the lungs in the morning and at bedtime. with spacer and rinse mouth afterwards. 1 each 5   cholecalciferol (VITAMIN D) 1000 units tablet  Take 1,000 Units by mouth daily.     EPINEPHrine 0.3 mg/0.3 mL IJ SOAJ injection Inject 0.3 mg into the muscle as needed for anaphylaxis. 2 each 1   fluticasone (FLONASE) 50 MCG/ACT nasal spray Place 2 sprays into both nostrils at bedtime. 16 g 0   gabapentin (NEURONTIN) 300 MG capsule Take 1 capsule (300 mg total) by mouth at bedtime. 90 capsule 1   levocetirizine (XYZAL) 5 MG tablet Take 1 tablet (5 mg total) by mouth every evening. 90 tablet 0   metFORMIN (GLUCOPHAGE) 1000 MG tablet TAKE 1 TABLET BY  MOUTH TWICE DAILY WITH A MEAL . APPOINTMENT REQUIRED FOR FUTURE REFILLS 180 tablet 0   montelukast (SINGULAIR) 10 MG tablet Take 1 tablet (10 mg total) by mouth at bedtime. 90 tablet 3   omeprazole (PRILOSEC) 40 MG capsule TAKE 1 CAPSULE BY MOUTH ONCE DAILY 90 capsule 1   oxybutynin (DITROPAN-XL) 10 MG 24 hr tablet Take 1 tablet (10 mg total) by mouth daily. 30 tablet 5   tamsulosin (FLOMAX) 0.4 MG CAPS capsule Take 1 capsule (0.4 mg total) by mouth daily. 90 capsule 3   hydrochlorothiazide (HYDRODIURIL) 12.5 MG tablet Take 1 tablet by mouth once daily 90 tablet 0   metoprolol succinate (TOPROL-XL) 50 MG 24 hr tablet Take 1 tablet (50 mg total) by mouth daily. Take with or immediately following a meal. 15 tablet 0   Dulaglutide 0.75 MG/0.5ML SOPN Inject 0.75 mg into the skin once a week. (Patient not taking: Reported on 09/23/2022) 6 mL 1   Tiotropium Bromide Monohydrate (SPIRIVA RESPIMAT) 1.25 MCG/ACT AERS Inhale 2 puffs into the lungs daily. (Patient not taking: Reported on 09/23/2022) 4 g 5   amoxicillin-clavulanate (AUGMENTIN) 875-125 MG tablet Take 1 tablet by mouth 2 (two) times daily. (Patient not taking: Reported on 09/23/2022) 20 tablet 0   No facility-administered medications prior to visit.    No Known Allergies  ROS Review of Systems  Constitutional:  Negative for activity change, appetite change, chills, diaphoresis, fatigue, fever and unexpected weight change.  HENT:  Negative for  congestion, dental problem, drooling and ear discharge.   Eyes:  Negative for pain, discharge, redness and itching.  Respiratory:  Negative for apnea, cough, choking, chest tightness, shortness of breath and wheezing.   Cardiovascular: Negative.  Negative for chest pain, palpitations and leg swelling.  Gastrointestinal:  Negative for abdominal distention, abdominal pain, anal bleeding, blood in stool, constipation, diarrhea and vomiting.  Endocrine: Negative for polydipsia, polyphagia and polyuria.  Genitourinary:  Negative for difficulty urinating, flank pain, frequency and genital sores.  Musculoskeletal: Negative.  Negative for arthralgias, back pain, gait problem and joint swelling.  Skin:  Negative for color change, pallor and rash.  Neurological:  Negative for dizziness, facial asymmetry, light-headedness, numbness and headaches.  Psychiatric/Behavioral:  Negative for agitation, behavioral problems, confusion, hallucinations, self-injury, sleep disturbance and suicidal ideas.       Objective:    Physical Exam Vitals and nursing note reviewed.  Constitutional:      General: He is not in acute distress.    Appearance: Normal appearance. He is obese. He is not ill-appearing, toxic-appearing or diaphoretic.  HENT:     Mouth/Throat:     Mouth: Mucous membranes are moist.     Pharynx: Oropharynx is clear. No oropharyngeal exudate or posterior oropharyngeal erythema.  Eyes:     General: No scleral icterus.       Right eye: No discharge.        Left eye: No discharge.     Extraocular Movements: Extraocular movements intact.     Conjunctiva/sclera: Conjunctivae normal.  Cardiovascular:     Rate and Rhythm: Normal rate and regular rhythm.     Pulses: Normal pulses.     Heart sounds: Normal heart sounds. No murmur heard.    No friction rub. No gallop.  Pulmonary:     Effort: Pulmonary effort is normal. No respiratory distress.     Breath sounds: Normal breath sounds. No stridor. No  wheezing, rhonchi or rales.  Chest:     Chest wall: No tenderness.  Abdominal:     General: There is no distension.     Palpations: Abdomen is soft.     Tenderness: There is no abdominal tenderness. There is no right CVA tenderness, left CVA tenderness or guarding.  Musculoskeletal:        General: No swelling, tenderness, deformity or signs of injury.     Right lower leg: No edema.     Left lower leg: No edema.  Skin:    General: Skin is warm and dry.     Capillary Refill: Capillary refill takes less than 2 seconds.     Coloration: Skin is not jaundiced or pale.     Findings: No bruising, erythema or lesion.  Neurological:     Mental Status: He is alert and oriented to person, place, and time.     Motor: No weakness.     Coordination: Coordination normal.     Gait: Gait normal.  Psychiatric:        Mood and Affect: Mood normal.        Behavior: Behavior normal.        Thought Content: Thought content normal.        Judgment: Judgment normal.     BP (!) 142/77   Pulse 97   Temp 97.7 F (36.5 C)   Ht 6\' 4"  (1.93 m)   Wt (!) 321 lb 6.4 oz (145.8 kg)   SpO2 97%   BMI 39.12 kg/m  Wt Readings from Last 3 Encounters:  09/23/22 (!) 321 lb 6.4 oz (145.8 kg)  08/14/22 (!) 330 lb (149.7 kg)  06/24/22 (!) 330 lb 9.6 oz (150 kg)    Lab Results  Component Value Date   TSH 1.270 03/13/2020   Lab Results  Component Value Date   WBC 7.7 03/18/2021   HGB 14.3 03/18/2021   HCT 42.7 03/18/2021   MCV 85 03/18/2021   PLT 397 03/18/2021   Lab Results  Component Value Date   NA 140 06/24/2022   K 4.4 06/24/2022   CO2 20 06/24/2022   GLUCOSE 170 (H) 06/24/2022   BUN 11 06/24/2022   CREATININE 0.88 06/24/2022   BILITOT 0.5 09/19/2021   ALKPHOS 53 09/19/2021   AST 34 09/19/2021   ALT 58 (H) 09/19/2021   PROT 7.0 09/19/2021   ALBUMIN 4.7 09/19/2021   CALCIUM 10.2 06/24/2022   ANIONGAP 13 06/11/2020   EGFR 102 06/24/2022   Lab Results  Component Value Date   CHOL 136  09/19/2021   Lab Results  Component Value Date   HDL 33 (L) 09/19/2021   Lab Results  Component Value Date   LDLCALC 79 09/19/2021   Lab Results  Component Value Date   TRIG 131 09/19/2021   Lab Results  Component Value Date   CHOLHDL 4.1 09/19/2021   Lab Results  Component Value Date   HGBA1C 8.5 (A) 09/23/2022      Assessment & Plan:   Problem List Items Addressed This Visit       Cardiovascular and Mediastinum   Essential hypertension    BP Readings from Last 3 Encounters:  09/23/22 (!) 142/77  08/14/22 (!) 193/92  06/24/22 (!) 159/93   HTN unControlled on amlodipine 10 mg daily, hydrochlorothiazide 12.5 mg daily.  Has metoprolol ordered but he has not been taking the medication.  Metoprolol 50 mg daily refilled.  I initially increased hydrochlorothiazide to 25 mg daily but I have called the patient and updated him with a new plan to continue hydrochlorothiazide  12.5 mg daily, restart metoprolol 50 mg daily, continue amlodipine 10 mg daily Patient encouraged to monitor blood pressure at home and reports blood pressure readings consistently greater than 130/80. Discussed DASH diet and dietary sodium restrictions Continue to increase dietary efforts and exercise.  Follow-up in 3 months        Relevant Medications   metoprolol succinate (TOPROL-XL) 50 MG 24 hr tablet   hydrochlorothiazide (HYDRODIURIL) 12.5 MG tablet   Other Relevant Orders   Basic Metabolic Panel   Lipid panel     Endocrine   Hyperlipidemia associated with type 2 diabetes mellitus (HCC) - Primary    Lab Results  Component Value Date   HGBA1C 8.5 (A) 09/23/2022  Chronic condition uncontrolled Currently on metformin 1000 mg twice daily Not taking Trulicity due to cost of medication Continue metformin 1000 mg twice daily start glipizide 5 mg daily in the mean time. Will consult the clinical pharmacist to see if the patient qualifies for cost of medication assistance for Trulicity. CBG goals  discussed with the patient Patient counseled on low-carb modified diet Check lipid panel at next visit, currently on atorvastatin 40 mg daily      Relevant Medications   glipiZIDE (GLUCOTROL XL) 5 MG 24 hr tablet   metoprolol succinate (TOPROL-XL) 50 MG 24 hr tablet   hydrochlorothiazide (HYDRODIURIL) 12.5 MG tablet   Other Relevant Orders   POCT glycosylated hemoglobin (Hb A1C) (Completed)   Basic Metabolic Panel   Lipid panel   AMB Referral to Pharmacy Medication Management     Other   Obesity, morbid, BMI 40.0-49.9 (HCC)    Wt Readings from Last 3 Encounters:  09/23/22 (!) 321 lb 6.4 oz (145.8 kg)  08/14/22 (!) 330 lb (149.7 kg)  06/24/22 (!) 330 lb 9.6 oz (150 kg)   Body mass index is 39.12 kg/m.  Patient counseled on low-carb modified diet Was encouraged to engage in regular moderate to vigorous exercise at least 150 minutes weekly      Relevant Medications   glipiZIDE (GLUCOTROL XL) 5 MG 24 hr tablet    Meds ordered this encounter  Medications   DISCONTD: hydrochlorothiazide (HYDRODIURIL) 25 MG tablet    Sig: Take 1 tablet (25 mg total) by mouth daily.    Dispense:  90 tablet    Refill:  3   glipiZIDE (GLUCOTROL XL) 5 MG 24 hr tablet    Sig: Take 1 tablet (5 mg total) by mouth daily with breakfast.    Dispense:  60 tablet    Refill:  0   metoprolol succinate (TOPROL-XL) 50 MG 24 hr tablet    Sig: Take 1 tablet (50 mg total) by mouth daily. Take with or immediately following a meal.    Dispense:  90 tablet    Refill:  0    APPOINTMENT REQUIRED FOR FUTURE REFILLS. LAST ATTEMPT   hydrochlorothiazide (HYDRODIURIL) 12.5 MG tablet    Sig: Take 1 tablet (12.5 mg total) by mouth daily.    Dispense:  90 tablet    Refill:  1    Please do not fill the prescription for hydrochlorothiazide 25 mg tablet    Follow-up: Return in about 3 months (around 12/24/2022) for HTN, DM.    Donell Beers, FNP

## 2022-09-23 NOTE — Assessment & Plan Note (Addendum)
Lab Results  Component Value Date   HGBA1C 8.5 (A) 09/23/2022  Chronic condition uncontrolled Currently on metformin 1000 mg twice daily Not taking Trulicity due to cost of medication Continue metformin 1000 mg twice daily start glipizide 5 mg daily in the mean time. Will consult the clinical pharmacist to see if the patient qualifies for cost of medication assistance for Trulicity. CBG goals discussed with the patient Patient counseled on low-carb modified diet Check lipid panel at next visit, currently on atorvastatin 40 mg daily

## 2022-09-23 NOTE — Assessment & Plan Note (Signed)
Wt Readings from Last 3 Encounters:  09/23/22 (!) 321 lb 6.4 oz (145.8 kg)  08/14/22 (!) 330 lb (149.7 kg)  06/24/22 (!) 330 lb 9.6 oz (150 kg)   Body mass index is 39.12 kg/m.  Patient counseled on low-carb modified diet Was encouraged to engage in regular moderate to vigorous exercise at least 150 minutes weekly

## 2022-09-23 NOTE — Assessment & Plan Note (Signed)
BP Readings from Last 3 Encounters:  09/23/22 (!) 142/77  08/14/22 (!) 193/92  06/24/22 (!) 159/93   HTN unControlled on amlodipine 10 mg daily, hydrochlorothiazide 12.5 mg daily.  Has metoprolol ordered but he has not been taking the medication.  Metoprolol 50 mg daily refilled.  I initially increased hydrochlorothiazide to 25 mg daily but I have called the patient and updated him with a new plan to continue hydrochlorothiazide 12.5 mg daily, restart metoprolol 50 mg daily, continue amlodipine 10 mg daily Patient encouraged to monitor blood pressure at home and reports blood pressure readings consistently greater than 130/80. Discussed DASH diet and dietary sodium restrictions Continue to increase dietary efforts and exercise.  Follow-up in 3 months

## 2022-09-25 ENCOUNTER — Telehealth: Payer: Self-pay

## 2022-09-25 NOTE — Progress Notes (Signed)
   Care Guide Note  09/25/2022 Name: Scott Buchanan MRN: [id number] DOB: 03-12-1966  Referred by: Massie Maroon, FNP Reason for referral : Care Coordination (Outreach to schedule with Pharm d )   [name] is a 57 y.o. year old male who is a primary care patient of Massie Maroon, FNP. [name] was referred to the pharmacist for assistance related to DM.    Successful contact was made with the patient to discuss pharmacy services including being ready for the pharmacist to call at least 5 minutes before the scheduled appointment time, to have medication bottles and any blood sugar or blood pressure readings ready for review. The patient agreed to meet with the pharmacist via with the pharmacist via telephone visit on (date/time).  10/15/2022  [name], RMA Care Guide [facility]  [place], [place] [address] Direct Dial: [phone] [name]

## 2022-10-02 ENCOUNTER — Ambulatory Visit (INDEPENDENT_AMBULATORY_CARE_PROVIDER_SITE_OTHER): Payer: 59

## 2022-10-02 DIAGNOSIS — J309 Allergic rhinitis, unspecified: Secondary | ICD-10-CM | POA: Diagnosis not present

## 2022-10-06 ENCOUNTER — Other Ambulatory Visit: Payer: Self-pay

## 2022-10-14 ENCOUNTER — Other Ambulatory Visit: Payer: Self-pay | Admitting: Family Medicine

## 2022-10-15 ENCOUNTER — Other Ambulatory Visit: Payer: 59 | Admitting: Pharmacist

## 2022-10-15 ENCOUNTER — Other Ambulatory Visit (HOSPITAL_COMMUNITY): Payer: Self-pay

## 2022-10-15 NOTE — Progress Notes (Unsigned)
10/15/2022 Name: Scott Buchanan MRN: [id number] DOB: 1965-10-07  Chief Complaint  Patient presents with   Medication Management   Diabetes    Scott Buchanan is a 57 y.o. year old male who presented for a telephone visit.   They were referred to the pharmacist by their PCP for assistance in managing diabetes.    Subjective:  Care Team: Primary Care Provider: Massie Maroon, FNP ; Next Scheduled Visit: 12/30/22  Medication Access/Adherence  Current Pharmacy:  [facility] [address] ([address]), [place] - [address] [place] ([place]) [place] [address] Phone: [phone] Fax: [phone]  [facility] - [place], [place] - [address] [place] [address] Phone: [phone] Fax: [phone]   Patient reports affordability concerns with their medications: Yes  Patient reports access/transportation concerns to their pharmacy: No  Patient reports adherence concerns with their medications:  No    Diabetes:  Current medications: metformin 1000 mg twice daily, glipizide XL 5 mg daily, Trulicity 0.75 mg week  Reports he stopped Trulicity in ~ March due to cost. [employer], copay through insurance is [phone]. Copay card would only take off $150.    Hyperlipidemia/ASCVD Risk Reduction  Current lipid lowering medications: atorvastatin 40 mg daily    Asthma:  Current medications: prescribed Breztri previously, now prescribed Symbicort + Spiriva by allergy; fluticasone nasal spray, montelukast 10 mg daily, levocetirizine 5 mg daily  Objective:  Lab Results  Component Value Date   HGBA1C 8.5 (A) 09/23/2022    Lab Results  Component Value Date   CREATININE 0.88 06/24/2022   BUN 11 06/24/2022   NA 140 06/24/2022   K 4.4 06/24/2022   CL 100 06/24/2022   CO2 20 06/24/2022    Lab Results  Component Value Date   CHOL 136 09/19/2021   HDL 33 (L) 09/19/2021   LDLCALC 79 09/19/2021   TRIG  131 09/19/2021   CHOLHDL 4.1 09/19/2021    Medications Reviewed Today     Reviewed by Alden Hipp, RPH-CPP (Pharmacist) on 10/15/22 at 0902  Med List Status: <None>   Medication Order Taking? Sig Documenting Provider Last Dose Status Informant  acetaminophen (TYLENOL) 650 MG CR tablet [id number]  Take 1,300 mg by mouth every 8 (eight) hours as needed for pain. [provider]  Active Self           Med Note Sherilyn Cooter, [facility]   Tue Mar 25, 2022  8:37 AM) Prn last dose today  albuterol (VENTOLIN HFA) 108 (90 Base) MCG/ACT inhaler [id number]  Inhale 2 puffs into the lungs every 4 (four) hours as needed for wheezing or shortness of breath. Massie Maroon, FNP  Active Self  amLODipine (NORVASC) 10 MG tablet [id number] Yes Take 1 tablet (10 mg total) by mouth daily. Massie Maroon, FNP Taking Active   Ascorbic Acid (VITAMIN C) 1000 MG tablet [id number] Yes Take 1,000 mg by mouth daily. [provider] Taking Active Self  aspirin EC 81 MG tablet [id number] Yes Take 81 mg by mouth daily. [provider] Taking Active Self  atorvastatin (LIPITOR) 40 MG tablet [id number] Yes Take 1 tablet (40 mg total) by mouth daily. Massie Maroon, FNP Taking Active   budesonide-formoterol [facility]) 160-4.5 MCG/ACT inhaler [id number]  Inhale 2 puffs into the lungs in the morning and at bedtime. with spacer and rinse mouth afterwards. Ellamae Sia, DO  Active   cholecalciferol (VITAMIN D) 1000 units  tablet [id number] Yes Take 1,000 Units by mouth daily. [provider] Taking Active Self  Dulaglutide 0.75 MG/0.5ML SOPN [id number]  Inject 0.75 mg into the skin once a week.  Patient not taking: Reported on 09/23/2022   Massie Maroon, FNP  Active   EPINEPHrine 0.3 mg/0.3 mL IJ SOAJ injection [id number]  Inject 0.3 mg into the muscle as needed for anaphylaxis. Ellamae Sia, DO  Active   fluticasone [facility]) 50 MCG/ACT nasal spray [id number] Yes USE 2 SPRAY(S) IN EACH NOSTRIL AT  BEDTIME Massie Maroon, FNP Taking Active   gabapentin (NEURONTIN) 300 MG capsule [id number] Yes Take 1 capsule (300 mg total) by mouth at bedtime. Massie Maroon, FNP Taking Active   glipiZIDE (GLUCOTROL XL) 5 MG 24 hr tablet [id number] Yes Take 1 tablet (5 mg total) by mouth daily with breakfast. Donell Beers, FNP Taking Active   hydrochlorothiazide (HYDRODIURIL) 12.5 MG tablet [id number] Yes Take 1 tablet (12.5 mg total) by mouth daily. Donell Beers, FNP Taking Active   levocetirizine (XYZAL) 5 MG tablet [id number] Yes Take 1 tablet (5 mg total) by mouth every evening. Massie Maroon, FNP Taking Active   metFORMIN (GLUCOPHAGE) 1000 MG tablet [id number] Yes TAKE 1 TABLET BY MOUTH TWICE DAILY WITH A MEAL . APPOINTMENT REQUIRED FOR FUTURE REFILLS Massie Maroon, FNP Taking Active   metoprolol succinate (TOPROL-XL) 50 MG 24 hr tablet [id number] Yes Take 1 tablet (50 mg total) by mouth daily. Take with or immediately following a meal. [name], Baird Kay, FNP Taking Active   montelukast (SINGULAIR) 10 MG tablet [id number] Yes Take 1 tablet (10 mg total) by mouth at bedtime. Massie Maroon, FNP Taking Active   omeprazole (PRILOSEC) 40 MG capsule [id number] Yes TAKE 1 CAPSULE BY MOUTH ONCE DAILY Massie Maroon, FNP Taking Active   oxybutynin (DITROPAN-XL) 10 MG 24 hr tablet [id number] Yes Take 1 tablet (10 mg total) by mouth daily. Massie Maroon, FNP Taking Active   tamsulosin [facility]) 0.4 MG CAPS capsule [id number] Yes Take 1 capsule (0.4 mg total) by mouth daily. Riki Altes, MD Taking Active   Tiotropium Bromide Monohydrate (SPIRIVA RESPIMAT) 1.25 MCG/ACT AERS [id number]  Inhale 2 puffs into the lungs daily.  Patient not taking: Reported on 09/23/2022   Ellamae Sia, DO  Active               Assessment/Plan:   Diabetes:f - Currently uncontrolled - Coordinated with pharmacy technician team. SGLT2 or GLP1 are going to be >$500 right now, appears patient has a high  deductible plan. He is unsure how close he is to meeting deductible, so he will call and investigate. He will let me know. If not close to meeting deductible, will consider basal insulin.  - Recommend to check glucose twice daily, fasting and 2 hour post prandial - Continue metformin and glipizide  Hyperlipidemia/ASCVD Risk Reduction: - Currently controlled.  - Recommend to continue current regimen at this time   Asthma: - Currently suboptimally treated due to medication access - As above, will follow up on status of deductible. Markus Daft is currently ~$600, if he meets deductible would be ~ $88 before copay card applied, which could bring to zero. Generic Symbicort is covered as Tier 1B on his formulary, will consider moving forward.    Follow Up Plan: pending patient formulary information  Catie TClearance Coots, PharmD, BCACP, CPP Clinical Pharmacist [facility] [phone]

## 2022-10-16 NOTE — Patient Instructions (Signed)
Tayven,   Let me know via MyChart when you have more information regarding your insurance deductible.   Thanks!  Catie Eppie Gibson, PharmD, BCACP, CPP Clinical Pharmacist [facility] [phone]

## 2022-10-30 ENCOUNTER — Other Ambulatory Visit: Payer: Self-pay

## 2022-10-30 DIAGNOSIS — E119 Type 2 diabetes mellitus without complications: Secondary | ICD-10-CM

## 2022-10-30 MED ORDER — METFORMIN HCL 1000 MG PO TABS
ORAL_TABLET | ORAL | 0 refills | Status: DC
Start: 2022-10-30 — End: 2023-01-15

## 2022-11-10 ENCOUNTER — Other Ambulatory Visit: Payer: Self-pay | Admitting: Family Medicine

## 2022-11-19 ENCOUNTER — Other Ambulatory Visit: Payer: Self-pay | Admitting: Nurse Practitioner

## 2022-11-19 DIAGNOSIS — E1169 Type 2 diabetes mellitus with other specified complication: Secondary | ICD-10-CM

## 2022-11-27 ENCOUNTER — Other Ambulatory Visit: Payer: Self-pay | Admitting: Family Medicine

## 2022-11-27 DIAGNOSIS — Z9109 Other allergy status, other than to drugs and biological substances: Secondary | ICD-10-CM

## 2022-12-01 ENCOUNTER — Other Ambulatory Visit: Payer: Self-pay | Admitting: Family Medicine

## 2022-12-01 DIAGNOSIS — E785 Hyperlipidemia, unspecified: Secondary | ICD-10-CM

## 2022-12-01 DIAGNOSIS — I10 Essential (primary) hypertension: Secondary | ICD-10-CM

## 2022-12-07 ENCOUNTER — Other Ambulatory Visit: Payer: Self-pay | Admitting: Family Medicine

## 2022-12-07 DIAGNOSIS — E1169 Type 2 diabetes mellitus with other specified complication: Secondary | ICD-10-CM

## 2022-12-15 ENCOUNTER — Other Ambulatory Visit: Payer: Self-pay | Admitting: Family Medicine

## 2022-12-15 ENCOUNTER — Other Ambulatory Visit: Payer: Self-pay | Admitting: Nurse Practitioner

## 2022-12-15 DIAGNOSIS — I10 Essential (primary) hypertension: Secondary | ICD-10-CM

## 2022-12-15 DIAGNOSIS — G63 Polyneuropathy in diseases classified elsewhere: Secondary | ICD-10-CM

## 2022-12-15 DIAGNOSIS — K219 Gastro-esophageal reflux disease without esophagitis: Secondary | ICD-10-CM

## 2022-12-19 ENCOUNTER — Other Ambulatory Visit: Payer: Self-pay

## 2022-12-19 DIAGNOSIS — I10 Essential (primary) hypertension: Secondary | ICD-10-CM

## 2022-12-19 DIAGNOSIS — K219 Gastro-esophageal reflux disease without esophagitis: Secondary | ICD-10-CM

## 2022-12-19 DIAGNOSIS — E889 Metabolic disorder, unspecified: Secondary | ICD-10-CM

## 2022-12-19 MED ORDER — OMEPRAZOLE 40 MG PO CPDR: DELAYED_RELEASE_CAPSULE | ORAL | 0 refills | Status: DC

## 2022-12-19 MED ORDER — METOPROLOL SUCCINATE ER 50 MG PO TB24: 50.0000 mg | ORAL_TABLET | Freq: Every day | ORAL | 0 refills | Status: DC

## 2022-12-19 MED ORDER — GABAPENTIN 300 MG PO CAPS
300.0000 mg | ORAL_CAPSULE | Freq: Every day | ORAL | 0 refills | Status: DC
Start: 2022-12-19 — End: 2023-06-18

## 2022-12-19 NOTE — Telephone Encounter (Signed)
Please advise Kh 

## 2022-12-30 ENCOUNTER — Other Ambulatory Visit: Payer: Self-pay | Admitting: Family Medicine

## 2022-12-30 ENCOUNTER — Encounter: Payer: Self-pay | Admitting: Family Medicine

## 2022-12-30 ENCOUNTER — Ambulatory Visit (INDEPENDENT_AMBULATORY_CARE_PROVIDER_SITE_OTHER): Payer: 59 | Admitting: Family Medicine

## 2022-12-30 VITALS — BP 140/78 | HR 82 | Temp 97.2°F | Wt 327.0 lb

## 2022-12-30 DIAGNOSIS — Z23 Encounter for immunization: Secondary | ICD-10-CM | POA: Diagnosis not present

## 2022-12-30 DIAGNOSIS — E119 Type 2 diabetes mellitus without complications: Secondary | ICD-10-CM | POA: Diagnosis not present

## 2022-12-30 DIAGNOSIS — I10 Essential (primary) hypertension: Secondary | ICD-10-CM | POA: Diagnosis not present

## 2022-12-30 DIAGNOSIS — J321 Chronic frontal sinusitis: Secondary | ICD-10-CM

## 2022-12-30 DIAGNOSIS — E785 Hyperlipidemia, unspecified: Secondary | ICD-10-CM

## 2022-12-30 DIAGNOSIS — N3000 Acute cystitis without hematuria: Secondary | ICD-10-CM

## 2022-12-30 LAB — POCT URINALYSIS DIP (PROADVANTAGE DEVICE)
Bilirubin, UA: NEGATIVE
Blood, UA: NEGATIVE
Glucose, UA: NEGATIVE mg/dL
Ketones, POC UA: NEGATIVE mg/dL
Nitrite, UA: POSITIVE — AB
Protein Ur, POC: 100 mg/dL — AB
Specific Gravity, Urine: 1.02
Urobilinogen, Ur: 0.2
pH, UA: 5.5 (ref 5.0–8.0)

## 2022-12-30 LAB — POCT GLYCOSYLATED HEMOGLOBIN (HGB A1C): Hemoglobin A1C: 8.1 % — AB (ref 4.0–5.6)

## 2022-12-30 MED ORDER — AMOXICILLIN-POT CLAVULANATE 875-125 MG PO TABS
1.0000 | ORAL_TABLET | Freq: Two times a day (BID) | ORAL | 0 refills | Status: DC
Start: 2022-12-30 — End: 2023-03-31

## 2022-12-30 NOTE — Progress Notes (Signed)
Established Patient Office Visit  Subjective   Patient ID: Scott Buchanan, male    DOB: 1966-03-18  Age: 57 y.o. MRN: [id number]  Chief Complaint  Patient presents with   Diabetes    Follow up fasting     Scott Buchanan is a 57 year old male with a medical history significant for type 2 diabetes mellitus, hypertension, chronic frontal sinusitis, obesity, hyperlipidemia, and moderate persistent asthma that presents for follow-up of chronic conditions. Patient has a primary complaint of nasal congestion, headache, and ear fullness.  Patient attributes sinusitis flare to changes in weather.  He has been taking over-the-counter medications without very much relief. Patient has a history of type 2 diabetes mellitus.  He has been taking medications consistently.  He has not been exercising or following a carbohydrate modified diet.  He denies any polyuria, polydipsia, or polyphasia.  No dizziness or blurry vision. Patient is up-to-date with eye exam. Patient has a history of hypertension.  Taking all medications consistently.  Does not check blood pressure at home.  Denies any lower extremity swelling, chest pain, or heart palpitations.  Patient is a non-smoker.    Patient Active Problem List   Diagnosis Date Noted   Tinea corporis 11/21/2021   Severe persistent asthma without complication 05/16/2021   Seasonal and perennial allergic rhinoconjunctivitis 03/18/2021   Asthma, not well controlled 10/30/2020   History of nasal polyp 10/30/2020   Heartburn 10/30/2020   Angina pectoris (HCC) 09/04/2020   Atypical chest pain    Shortness of breath    Acute diarrhea 11/20/2019   Change in bowel habits 11/20/2019   Elevated LFTs 11/20/2019   Fatty liver 11/20/2019   History of esophagitis 11/20/2019   Gastritis 03/11/2019   Screening for viral disease 03/11/2019   Musculoskeletal pain 03/11/2019   Esophagitis 03/11/2019   LUQ pain 03/11/2019   Hav (hallux abducto valgus), right 12/21/2018    Hyperlipidemia associated with type 2 diabetes mellitus (HCC) 05/14/2018   Primary localized osteoarthritis of right knee 03/02/2018   Status post right partial knee replacement 03/02/2018   Morbid obesity (HCC) 07/27/2017   Type 2 diabetes mellitus without complication, without long-term current use of insulin (HCC) 05/12/2017   Chronic frontal sinusitis 01/02/2017   Essential hypertension 10/21/2016   Hyperlipidemia LDL goal <100 10/21/2016   Elevated serum glucose 10/21/2016   Vitamin D deficiency 10/21/2016   Need for Tdap vaccination 10/21/2016   Obesity, morbid, BMI 40.0-49.9 (HCC) 06/26/2015   Knee instability 12/15/2013   Seasonal allergies 11/09/2013   Past Medical History:  Diagnosis Date   Arthritis    Asthma    Diabetes mellitus without complication (HCC)    type 2   Hypertension    Primary localized osteoarthritis of right knee 03/02/2018   Past Surgical History:  Procedure Laterality Date   CYSTECTOMY     tailbone   ETHMOIDECTOMY Bilateral 04/07/2017   Procedure: BILATERAL ETHMOIDECTOMY AND SPHENOIDECTOMY;  Surgeon: Newman Pies, MD;  Location: [facility];  Service: ENT;  Laterality: Bilateral;   KNEE ARTHROSCOPY W/ MENISCAL REPAIR     right and left knee one year apart   LEFT HEART CATH AND CORONARY ANGIOGRAPHY N/A 09/04/2020   Procedure: LEFT HEART CATH AND CORONARY ANGIOGRAPHY;  Surgeon: Swaziland, Peter M, MD;  Location[facility] INVAS[facility] CV [facility];  Service: Cardiovascular;  Laterality: N/A;   PARTIAL KNEE ARTHROPLASTY Right 03/02/2018   Procedure: UNICOMPARTMENTAL KNEE;  Surgeon: Teryl Lucy, MD;  Location: [facility];  Service: Orthopedics;  Laterality: Right;  with block   SINUS ENDO WITH FUSION Bilateral 04/07/2017   Procedure: BILATERAL FRONTAL RECESS SINUS EXPLORATION WITH SINUS FUSION NAVIGATION;  Surgeon: Newman Pies, MD;  Location: [facility];  Service: ENT;  Laterality: Bilateral;   SINUS EXPLORATION     TONSILLECTOMY     TURBINATE  REDUCTION Bilateral 04/07/2017   Procedure: BILATERAL TURBINATE REDUCTION;  Surgeon: Newman Pies, MD;  Location: [facility];  Service: ENT;  Laterality: Bilateral;   unicompartmental right knee        Review of Systems  Constitutional: Negative.   HENT:  Positive for congestion and sinus pain. Negative for ear discharge, ear pain, nosebleeds and tinnitus.   Eyes: Negative.   Respiratory:  Positive for cough and shortness of breath. Negative for sputum production and wheezing.   Cardiovascular: Negative.  Negative for palpitations and PND.  Genitourinary: Negative.   Musculoskeletal: Negative.   Skin: Negative.   Neurological: Negative.   Psychiatric/Behavioral: Negative.        Objective:     BP (!) 140/78   Pulse 82   Temp (!) 97.2 F (36.2 C)   Wt (!) 327 lb (148.3 kg)   SpO2 97%   BMI 39.80 kg/m  BP Readings from Last 3 Encounters:  12/30/22 (!) 140/78  09/23/22 (!) 142/77  08/14/22 (!) 193/92   Wt Readings from Last 3 Encounters:  12/30/22 (!) 327 lb (148.3 kg)  09/23/22 (!) 321 lb 6.4 oz (145.8 kg)  08/14/22 (!) 330 lb (149.7 kg)      Physical Exam Constitutional:      Appearance: He is obese.  Eyes:     Pupils: Pupils are equal, round, and reactive to light.  Skin:    General: Skin is warm.  Neurological:     General: No focal deficit present.     Mental Status: Mental status is at baseline.  Psychiatric:        Mood and Affect: Mood normal.        Behavior: Behavior normal.        Thought Content: Thought content normal.        Judgment: Judgment normal.     Results for orders placed or performed in visit on 12/30/22  POCT glycosylated hemoglobin (Hb A1C)  Result Value Ref Range   Hemoglobin A1C 8.1 (A) 4.0 - 5.6 %   HbA1c POC (<> result, manual entry)     HbA1c, POC (prediabetic range)     HbA1c, POC (controlled diabetic range)      Last CBC Lab Results  Component Value Date   WBC 7.7 03/18/2021   HGB 14.3 03/18/2021   HCT  42.7 03/18/2021   MCV 85 03/18/2021   MCH 28.5 03/18/2021   RDW 13.4 03/18/2021   PLT 397 03/18/2021   Last metabolic panel Lab Results  Component Value Date   GLUCOSE 170 (H) 06/24/2022   NA 140 06/24/2022   K 4.4 06/24/2022   CL 100 06/24/2022   CO2 20 06/24/2022   BUN 11 06/24/2022   CREATININE 0.88 06/24/2022   EGFR 102 06/24/2022   CALCIUM 10.2 06/24/2022   PROT 7.0 09/19/2021   ALBUMIN 4.7 09/19/2021   LABGLOB 2.3 09/19/2021   AGRATIO 2.0 09/19/2021   BILITOT 0.5 09/19/2021   ALKPHOS 53 09/19/2021   AST 34 09/19/2021   ALT 58 (H) 09/19/2021   ANIONGAP 13 06/11/2020   Last lipids Lab Results  Component Value Date   CHOL 136 09/19/2021  HDL 33 (L) 09/19/2021   LDLCALC 79 09/19/2021   TRIG 131 09/19/2021   CHOLHDL 4.1 09/19/2021      The 10-year ASCVD risk score (Arnett DK, et al., 2019) is: 13.2%    Assessment & Plan:   Problem List Items Addressed This Visit       Endocrine   Type 2 diabetes mellitus without complication, without long-term current use of insulin (HCC) - Primary   Relevant Orders   POCT glycosylated hemoglobin (Hb A1C) (Completed)   Other Visit Diagnoses     Need for influenza vaccination       Relevant Orders   Flu vaccine trivalent PF, 6mos and older(Flulaval,Afluria,Fluarix,Fluzone) (Completed)     1. Type 2 diabetes mellitus without complication, without long-term current use of insulin (HCC) Patient has not been following a carbohydrate modified diet.  Recommend avoiding simple carbohydrates.  Discussed at length.  Will continue current medication regimen.  Will recheck hemoglobin A1c in 3 months. - POCT glycosylated hemoglobin (Hb A1C) - Lipid Panel - Comprehensive metabolic panel - POCT Urinalysis DIP (Proadvantage Device)  2. Need for influenza vaccination  - Flu vaccine trivalent PF, 6mos and older(Flulaval,Afluria,Fluarix,Fluzone)  3. Essential hypertension BP (!) 140/78   Pulse 82   Temp (!) 97.2 F (36.2 C)    Wt (!) 327 lb (148.3 kg)   SpO2 97%   BMI 39.80 kg/m  - Continue medication, monitor blood pressure at home. Continue DASH diet.  Reminder to go to the ER if any CP, SOB, nausea, dizziness, severe HA, changes vision/speech, left arm numbness and tingling and jaw pain.    4. Obesity, morbid, BMI 40.0-49.9 (HCC) The patient is asked to make an attempt to improve diet and exercise patterns to aid in medical management of this problem.   5. Chronic frontal sinusitis  - amoxicillin-clavulanate (AUGMENTIN) 875-125 MG tablet; Take 1 tablet by mouth 2 (two) times daily.  Dispense: 20 tablet; Refill: 0  6. Hyperlipidemia LDL goal <100  - Lipid Panel - Comprehensive metabolic panel  7. Acute cystitis without hematuria  - Urine Culture  Follow up in 3 months for chronic conditions

## 2022-12-31 LAB — LIPID PANEL
Chol/HDL Ratio: 5.3 ratio — ABNORMAL HIGH (ref 0.0–5.0)
Cholesterol, Total: 176 mg/dL (ref 100–199)
HDL: 33 mg/dL — ABNORMAL LOW (ref >39)
LDL Chol Calc (NIH): 109 mg/dL — ABNORMAL HIGH (ref 0–99)
Triglycerides: 195 mg/dL — ABNORMAL HIGH (ref 0–149)
VLDL Cholesterol Cal: 34 mg/dL (ref 5–40)

## 2022-12-31 LAB — COMPREHENSIVE METABOLIC PANEL
ALT: 51 IU/L — ABNORMAL HIGH (ref 0–44)
AST: 28 IU/L (ref 0–40)
Albumin: 4.5 g/dL (ref 3.8–4.9)
Alkaline Phosphatase: 57 IU/L (ref 44–121)
BUN/Creatinine Ratio: 16 (ref 9–20)
BUN: 14 mg/dL (ref 6–24)
Bilirubin Total: 0.4 mg/dL (ref 0.0–1.2)
CO2: 19 mmol/L — ABNORMAL LOW (ref 20–29)
Calcium: 9.6 mg/dL (ref 8.7–10.2)
Chloride: 104 mmol/L (ref 96–106)
Creatinine, Ser: 0.85 mg/dL (ref 0.76–1.27)
Globulin, Total: 2.5 g/dL (ref 1.5–4.5)
Glucose: 209 mg/dL — ABNORMAL HIGH (ref 70–99)
Potassium: 4.2 mmol/L (ref 3.5–5.2)
Sodium: 139 mmol/L (ref 134–144)
Total Protein: 7 g/dL (ref 6.0–8.5)
eGFR: 103 mL/min/{1.73_m2} (ref >59)

## 2023-01-02 ENCOUNTER — Other Ambulatory Visit: Payer: Self-pay | Admitting: Family Medicine

## 2023-01-02 DIAGNOSIS — N3000 Acute cystitis without hematuria: Secondary | ICD-10-CM

## 2023-01-02 LAB — URINE CULTURE

## 2023-01-02 MED ORDER — CIPROFLOXACIN HCL 500 MG PO TABS
500.0000 mg | ORAL_TABLET | Freq: Two times a day (BID) | ORAL | 0 refills | Status: AC
Start: 2023-01-02 — End: 2023-01-07

## 2023-01-02 NOTE — Progress Notes (Signed)
Meds ordered this encounter  Medications   ciprofloxacin (CIPRO) 500 MG tablet    Sig: Take 1 tablet (500 mg total) by mouth 2 (two) times daily for 5 days.    Dispense:  10 tablet    Refill:  0    Order Specific Question:   Supervising Provider    Answer:   Quentin Angst [[id number]]   Nolon Nations  APRN, MSN, FNP-C [facility] [address] [place], [place] [address] [phone]

## 2023-01-15 ENCOUNTER — Other Ambulatory Visit: Payer: Self-pay | Admitting: Family Medicine

## 2023-01-15 DIAGNOSIS — E119 Type 2 diabetes mellitus without complications: Secondary | ICD-10-CM

## 2023-01-15 MED ORDER — METFORMIN HCL 1000 MG PO TABS
ORAL_TABLET | ORAL | 0 refills | Status: DC
Start: 2023-01-15 — End: 2023-05-04

## 2023-01-19 ENCOUNTER — Other Ambulatory Visit: Payer: Self-pay | Admitting: Nurse Practitioner

## 2023-01-19 DIAGNOSIS — E1169 Type 2 diabetes mellitus with other specified complication: Secondary | ICD-10-CM

## 2023-01-25 ENCOUNTER — Other Ambulatory Visit: Payer: Self-pay | Admitting: Nurse Practitioner

## 2023-01-25 DIAGNOSIS — E1169 Type 2 diabetes mellitus with other specified complication: Secondary | ICD-10-CM

## 2023-02-02 ENCOUNTER — Other Ambulatory Visit: Payer: Self-pay | Admitting: Family Medicine

## 2023-02-02 ENCOUNTER — Other Ambulatory Visit: Payer: Self-pay | Admitting: Nurse Practitioner

## 2023-02-02 DIAGNOSIS — E1169 Type 2 diabetes mellitus with other specified complication: Secondary | ICD-10-CM

## 2023-03-01 ENCOUNTER — Other Ambulatory Visit: Payer: Self-pay | Admitting: Family Medicine

## 2023-03-01 DIAGNOSIS — E1169 Type 2 diabetes mellitus with other specified complication: Secondary | ICD-10-CM

## 2023-03-01 DIAGNOSIS — E669 Obesity, unspecified: Secondary | ICD-10-CM

## 2023-03-04 ENCOUNTER — Other Ambulatory Visit: Payer: Self-pay | Admitting: Family Medicine

## 2023-03-04 DIAGNOSIS — J3089 Other allergic rhinitis: Secondary | ICD-10-CM

## 2023-03-09 ENCOUNTER — Other Ambulatory Visit: Payer: Self-pay | Admitting: Family Medicine

## 2023-03-09 DIAGNOSIS — I10 Essential (primary) hypertension: Secondary | ICD-10-CM

## 2023-03-09 DIAGNOSIS — Z9109 Other allergy status, other than to drugs and biological substances: Secondary | ICD-10-CM

## 2023-03-09 DIAGNOSIS — E785 Hyperlipidemia, unspecified: Secondary | ICD-10-CM

## 2023-03-16 ENCOUNTER — Other Ambulatory Visit: Payer: Self-pay | Admitting: Family Medicine

## 2023-03-16 DIAGNOSIS — I10 Essential (primary) hypertension: Secondary | ICD-10-CM

## 2023-03-23 ENCOUNTER — Other Ambulatory Visit: Payer: Self-pay | Admitting: Family Medicine

## 2023-03-23 DIAGNOSIS — E1169 Type 2 diabetes mellitus with other specified complication: Secondary | ICD-10-CM

## 2023-03-24 ENCOUNTER — Other Ambulatory Visit (HOSPITAL_COMMUNITY): Payer: Self-pay | Admitting: Nurse Practitioner

## 2023-03-24 DIAGNOSIS — I10 Essential (primary) hypertension: Secondary | ICD-10-CM

## 2023-03-24 MED ORDER — METOPROLOL SUCCINATE ER 50 MG PO TB24: 50.0000 mg | ORAL_TABLET | Freq: Every day | ORAL | 1 refills | Status: DC

## 2023-03-25 ENCOUNTER — Other Ambulatory Visit: Payer: Self-pay | Admitting: Nurse Practitioner

## 2023-03-25 DIAGNOSIS — I10 Essential (primary) hypertension: Secondary | ICD-10-CM

## 2023-03-25 MED ORDER — METOPROLOL SUCCINATE ER 50 MG PO TB24: 50.0000 mg | ORAL_TABLET | Freq: Every day | ORAL | 0 refills | Status: DC

## 2023-03-31 ENCOUNTER — Encounter: Payer: Self-pay | Admitting: Nurse Practitioner

## 2023-03-31 ENCOUNTER — Ambulatory Visit (INDEPENDENT_AMBULATORY_CARE_PROVIDER_SITE_OTHER): Payer: 59 | Admitting: Nurse Practitioner

## 2023-03-31 ENCOUNTER — Ambulatory Visit: Payer: Self-pay | Admitting: Family Medicine

## 2023-03-31 VITALS — BP 149/85 | HR 80 | Temp 97.2°F | Wt 328.0 lb

## 2023-03-31 DIAGNOSIS — I10 Essential (primary) hypertension: Secondary | ICD-10-CM | POA: Diagnosis not present

## 2023-03-31 DIAGNOSIS — M25511 Pain in right shoulder: Secondary | ICD-10-CM | POA: Insufficient documentation

## 2023-03-31 DIAGNOSIS — E119 Type 2 diabetes mellitus without complications: Secondary | ICD-10-CM | POA: Diagnosis not present

## 2023-03-31 DIAGNOSIS — B370 Candidal stomatitis: Secondary | ICD-10-CM | POA: Insufficient documentation

## 2023-03-31 LAB — POCT GLYCOSYLATED HEMOGLOBIN (HGB A1C): Hemoglobin A1C: 7.4 % — AB (ref 4.0–5.6)

## 2023-03-31 MED ORDER — NYSTATIN 100000 UNIT/ML MT SUSP: 5.0000 mL | Freq: Four times a day (QID) | OROMUCOSAL | 0 refills | Status: AC

## 2023-03-31 MED ORDER — NYSTATIN 100000 UNIT/ML MT SUSP: 5.0000 mL | Freq: Four times a day (QID) | OROMUCOSAL | 0 refills | Status: DC

## 2023-03-31 MED ORDER — HYDROCHLOROTHIAZIDE 25 MG PO TABS: 25.0000 mg | ORAL_TABLET | Freq: Every day | ORAL | 0 refills | Status: DC

## 2023-03-31 MED ORDER — IBUPROFEN 600 MG PO TABS: 600.0000 mg | ORAL_TABLET | Freq: Three times a day (TID) | ORAL | 0 refills | Status: DC | PRN

## 2023-03-31 MED ORDER — TRULICITY 1.5 MG/0.5ML ~~LOC~~ SOAJ: 1.5000 mg | SUBCUTANEOUS | 0 refills | Status: DC

## 2023-03-31 NOTE — Assessment & Plan Note (Signed)
-   nystatin (MYCOSTATIN) 100000 UNIT/ML suspension; Take 5 mLs (500,000 Units total) by mouth 4 (four) times daily for 7 days.  Dispense: 140 mL; Refill: 0

## 2023-03-31 NOTE — Assessment & Plan Note (Signed)
A1c 7.4 Continue glipizide 5 mg daily, metformin 1000 mg twice daily increase Trulicity to 1.5 mg once weekly.  Taking atorvastatin 40 mg daily for hyperlipidemia Patient counseled on low-carb modified diet, encouraged to engage in regular moderate exercises at least 150 minutes weekly CBG goals discussed encouraged to report blood sugar readings of less than 70 Up-to-date with diabetic eye exam records requested today Checking urine microalbumin, lipid panel, Will recheck A1c in 3 months

## 2023-03-31 NOTE — Progress Notes (Signed)
New Patient Office Visit  Subjective:  Patient ID: Scott Buchanan, male    DOB: 1966-02-15  Age: 57 y.o. MRN: [id number]  CC:  Chief Complaint  Patient presents with   Hypertension    Patient stated that he took his BP medication at 8am   Diabetes    HPI Scott Buchanan is a 57 y.o. male  has a past medical history of Arthritis, Asthma, Diabetes mellitus without complication (HCC), Fatty liver (11/20/2019), Hyperlipidemia associated with type 2 diabetes mellitus (HCC) (05/14/2018), Hypertension, Morbid obesity (HCC) (07/27/2017), Primary localized osteoarthritis of right knee (03/02/2018), Seasonal allergies (11/09/2013), Severe persistent asthma without complication (05/16/2021), and Type 2 diabetes mellitus without complication, without long-term current use of insulin (HCC) (05/12/2017).  Patient presents for follow-up for his chronic medical conditions Patient denies any adverse reactions to current medications  Hypertension.  Currently on amlodipine 10 mg daily, hydrochlorothiazide 12.5 mg daily, metoprolol 50 mg daily.  Stated that blood pressure has been elevated at home.  No complaints of shortness of breath dizziness edema  Uncontrolled type 2 diabetes.  Currently on glipizide 5 mg daily, Trulicity 0.75 mg once weekly, metformin 1000 mg twice daily.  Patient does not monitor blood sugars at home he denies signs of hypoglycemia.  Takes atorvastatin 40 mg daily for hyperlipidemia.  States that his diet can be better walks about 20,000 steps daily.   Acute right shoulder pain.  Patient complains of right shoulder pain that started on 3 days ago after lifting something heavy he has been taking Tylenol and aspirin as needed, currently has aching pain rated 5/10.  Oral thrush.  Patient complains of oral thrush, stated that he had these in the past and was treated with medication.   Patient encouraged to get shingles vaccine at the pharmacy      Past Medical History:  Diagnosis  Date   Arthritis    Asthma    Diabetes mellitus without complication (HCC)    type 2   Fatty liver 11/20/2019   Hyperlipidemia associated with type 2 diabetes mellitus (HCC) 05/14/2018   Hypertension    Morbid obesity (HCC) 07/27/2017   Primary localized osteoarthritis of right knee 03/02/2018   Seasonal allergies 11/09/2013   Severe persistent asthma without complication 05/16/2021   Type 2 diabetes mellitus without complication, without long-term current use of insulin (HCC) 05/12/2017    Past Surgical History:  Procedure Laterality Date   CYSTECTOMY     tailbone   ETHMOIDECTOMY Bilateral 04/07/2017   Procedure: BILATERAL ETHMOIDECTOMY AND SPHENOIDECTOMY;  Surgeon: Newman Pies, MD;  Location: [facility];  Service: ENT;  Laterality: Bilateral;   KNEE ARTHROSCOPY W/ MENISCAL REPAIR     right and left knee one year apart   LEFT HEART CATH AND CORONARY ANGIOGRAPHY N/A 09/04/2020   Procedure: LEFT HEART CATH AND CORONARY ANGIOGRAPHY;  Surgeon: Swaziland, Peter M, MD;  Location: [facility];  Service: Cardiovascular;  Laterality: N/A;   PARTIAL KNEE ARTHROPLASTY Right 03/02/2018   Procedure: UNICOMPARTMENTAL KNEE;  Surgeon: Teryl Lucy, MD;  Location: [facility];  Service: Orthopedics;  Laterality: Right;  with block   SINUS ENDO WITH FUSION Bilateral 04/07/2017   Procedure: BILATERAL FRONTAL RECESS SINUS EXPLORATION WITH SINUS FUSION NAVIGATION;  Surgeon: Newman Pies, MD;  Location: [facility];  Service: ENT;  Laterality: Bilateral;   SINUS EXPLORATION     TONSILLECTOMY     TURBINATE REDUCTION Bilateral 04/07/2017   Procedure: BILATERAL TURBINATE REDUCTION;  Surgeon: Suszanne Conners,  Su, MD;  Location: [facility];  Service: ENT;  Laterality: Bilateral;   unicompartmental right knee      Family History  Problem Relation Age of Onset   Hypertension Mother    Colon cancer Neg Hx    Colon polyps Neg Hx    Stomach cancer Neg Hx    Rectal cancer Neg Hx     Esophageal cancer Neg Hx    Inflammatory bowel disease Neg Hx    Liver disease Neg Hx    Pancreatic cancer Neg Hx     Social History   Socioeconomic History   Marital status: Single    Spouse name: Not on file   Number of children: 1   Years of education: Not on file   Highest education level: Bachelor's degree (e.g., BA, AB, BS)  Occupational History   Occupation: maintance   Tobacco Use   Smoking status: Former    Types: Cigarettes   Smokeless tobacco: Never   Tobacco comments:    quit 10 years ago  Vaping Use   Vaping status: Never Used  Substance and Sexual Activity   Alcohol use: No   Drug use: No   Sexual activity: Yes  Other Topics Concern   Not on file  Social History Narrative   Not on file   Social Determinants of Health   Financial Resource Strain: Low Risk  (03/28/2023)   Overall Financial Resource Strain (CARDIA)    Difficulty of Paying Living Expenses: Not very hard  Food Insecurity: No Food Insecurity (03/28/2023)   Hunger Vital Sign    Worried About Running Out of Food in the Last Year: Never true    Ran Out of Food in the Last Year: Never true  Transportation Needs: No Transportation Needs (03/28/2023)   PRAPARE - Administrator, Civil Service (Medical): No    Lack of Transportation (Non-Medical): No  Physical Activity: Sufficiently Active (03/28/2023)   Exercise Vital Sign    Days of Exercise per Week: 5 days    Minutes of Exercise per Session: 30 min  Stress: No Stress Concern Present (03/28/2023)   [employer] of Occupational Health - Occupational Stress Questionnaire    Feeling of Stress : Only a little  Social Connections: Socially Isolated (03/28/2023)   Social Connection and Isolation Panel [NHANES]    Frequency of Communication with Friends and Family: Once a week    Frequency of Social Gatherings with Friends and Family: Once a week    Attends Religious Services: Never    Database administrator or Organizations: No     Attends Engineer, structural: Not on file    Marital Status: Never married  Intimate Partner Violence: Not on file    ROS Review of Systems  Constitutional:  Negative for appetite change, chills, fatigue and fever.  HENT:  Negative for congestion, postnasal drip, rhinorrhea and sneezing.   Respiratory:  Negative for cough, shortness of breath and wheezing.   Cardiovascular:  Negative for chest pain, palpitations and leg swelling.  Gastrointestinal:  Negative for abdominal pain, constipation, nausea and vomiting.  Genitourinary:  Negative for difficulty urinating, dysuria, flank pain and frequency.  Musculoskeletal:  Positive for arthralgias. Negative for back pain, joint swelling and myalgias.  Skin:  Negative for color change, pallor, rash and wound.  Neurological:  Negative for dizziness, facial asymmetry, weakness, numbness and headaches.  Psychiatric/Behavioral:  Negative for behavioral problems, confusion, self-injury and suicidal ideas.     Objective:  Today's Vitals: BP (!) 149/85   Pulse 80   Temp (!) 97.2 F (36.2 C)   Wt (!) 328 lb (148.8 kg)   SpO2 97%   BMI 39.93 kg/m   Physical Exam Vitals and nursing note reviewed.  Constitutional:      General: He is not in acute distress.    Appearance: Normal appearance. He is obese. He is not ill-appearing, toxic-appearing or diaphoretic.  HENT:     Mouth/Throat:     Mouth: Mucous membranes are moist.     Pharynx: Oropharynx is clear. No oropharyngeal exudate or posterior oropharyngeal erythema.     Comments: white patches noted on the tongue, Eyes:     General: No scleral icterus.       Right eye: No discharge.        Left eye: No discharge.     Extraocular Movements: Extraocular movements intact.     Conjunctiva/sclera: Conjunctivae normal.  Cardiovascular:     Rate and Rhythm: Normal rate and regular rhythm.     Pulses: Normal pulses.     Heart sounds: Normal heart sounds. No murmur heard.    No  friction rub. No gallop.  Pulmonary:     Effort: Pulmonary effort is normal. No respiratory distress.     Breath sounds: Normal breath sounds. No stridor. No wheezing, rhonchi or rales.  Chest:     Chest wall: No tenderness.  Abdominal:     General: There is no distension.     Palpations: Abdomen is soft.     Tenderness: There is no abdominal tenderness. There is no right CVA tenderness, left CVA tenderness or guarding.  Musculoskeletal:        General: No swelling, tenderness, deformity or signs of injury.     Right lower leg: No edema.     Left lower leg: No edema.     Comments: Tenderness on range of motion of right shoulder, skin warm and dry no redness or swelling noted has palpable radial pulse  Skin:    General: Skin is warm and dry.     Capillary Refill: Capillary refill takes less than 2 seconds.     Coloration: Skin is not jaundiced or pale.     Findings: No bruising, erythema or lesion.  Neurological:     Mental Status: He is alert and oriented to person, place, and time.     Motor: No weakness.     Coordination: Coordination normal.     Gait: Gait normal.  Psychiatric:        Mood and Affect: Mood normal.        Behavior: Behavior normal.        Thought Content: Thought content normal.        Judgment: Judgment normal.     Assessment & Plan:   Problem List Items Addressed This Visit       Cardiovascular and Mediastinum   Essential hypertension    BP Readings from Last 3 Encounters:  03/31/23 (!) 149/85  12/30/22 (!) 140/78  09/23/22 (!) 142/77   HTN unControlled .  Continue metoprolol 50 mg daily, amlodipine 10 mg daily, increase hydrochlorothiazide to 12.5 mg daily BMP today, BMP in 2 weeks Discussed DASH diet and dietary sodium restrictions Continue to increase dietary efforts and exercise.  Encouraged to monitor blood pressure at home BP goal is less than 130 keep a log and bring to next appointment in 4 weeks        Relevant Medications  hydrochlorothiazide (HYDRODIURIL) 25 MG tablet   Other Relevant Orders   Basic metabolic panel   Basic metabolic panel     Digestive   Thrush, oral     - nystatin (MYCOSTATIN) 100000 UNIT/ML suspension; Take 5 mLs (500,000 Units total) by mouth 4 (four) times daily for 7 days.  Dispense: 140 mL; Refill: 0       Relevant Medications   nystatin (MYCOSTATIN) 100000 UNIT/ML suspension     Endocrine   Type 2 diabetes mellitus without complication, without long-term current use of insulin (HCC) - Primary    A1c 7.4 Continue glipizide 5 mg daily, metformin 1000 mg twice daily increase Trulicity to 1.5 mg once weekly.  Taking atorvastatin 40 mg daily for hyperlipidemia Patient counseled on low-carb modified diet, encouraged to engage in regular moderate exercises at least 150 minutes weekly CBG goals discussed encouraged to report blood sugar readings of less than 70 Up-to-date with diabetic eye exam records requested today Checking urine microalbumin, lipid panel, Will recheck A1c in 3 months      Relevant Medications   Dulaglutide (TRULICITY) 1.5 MG/0.5ML SOAJ   Other Relevant Orders   POCT glycosylated hemoglobin (Hb A1C)   Lipid panel   Microalbumin / creatinine urine ratio     Other   Acute pain of right shoulder    Take ibuprofen 600 mg 3 times daily as needed alternate with Tylenol 650 mg every 6 hours as needed Application of heat or ice encouraged      Relevant Medications   ibuprofen (ADVIL) 600 MG tablet    Outpatient Encounter Medications as of 03/31/2023  Medication Sig   acetaminophen (TYLENOL) 650 MG CR tablet Take 1,300 mg by mouth every 8 (eight) hours as needed for pain.   albuterol (VENTOLIN HFA) 108 (90 Base) MCG/ACT inhaler Inhale 2 puffs into the lungs every 4 (four) hours as needed for wheezing or shortness of breath.   amLODipine (NORVASC) 10 MG tablet Take 1 tablet by mouth once daily   Ascorbic Acid (VITAMIN C) 1000 MG tablet Take 1,000 mg by mouth  daily.   aspirin EC 81 MG tablet Take 81 mg by mouth daily.   atorvastatin (LIPITOR) 40 MG tablet Take 1 tablet by mouth once daily   cholecalciferol (VITAMIN D) 1000 units tablet Take 1,000 Units by mouth daily.   Dulaglutide (TRULICITY) 1.5 MG/0.5ML SOAJ Inject 1.5 mg into the skin once a week.   fluticasone (FLONASE) 50 MCG/ACT nasal spray USE 2 SPRAY(S) IN EACH NOSTRIL AT BEDTIME   gabapentin (NEURONTIN) 300 MG capsule Take 1 capsule (300 mg total) by mouth at bedtime.   glipiZIDE (GLUCOTROL XL) 5 MG 24 hr tablet Take 1 tablet by mouth once daily with breakfast   hydrochlorothiazide (HYDRODIURIL) 25 MG tablet Take 1 tablet (25 mg total) by mouth daily.   ibuprofen (ADVIL) 600 MG tablet Take 1 tablet (600 mg total) by mouth every 8 (eight) hours as needed.   levocetirizine (XYZAL) 5 MG tablet TAKE 1 TABLET BY MOUTH ONCE DAILY IN THE EVENING   metFORMIN (GLUCOPHAGE) 1000 MG tablet TAKE 1 TABLET BY MOUTH TWICE DAILY WITH A MEAL . APPOINTMENT REQUIRED FOR FUTURE REFILLS   metoprolol succinate (TOPROL-XL) 50 MG 24 hr tablet Take 1 tablet (50 mg total) by mouth daily. Take with or immediately following a meal.   montelukast (SINGULAIR) 10 MG tablet TAKE 1 TABLET BY MOUTH AT BEDTIME   omeprazole (PRILOSEC) 40 MG capsule TAKE 1 CAPSULE BY MOUTH ONCE DAILY  oxybutynin (DITROPAN-XL) 10 MG 24 hr tablet Take 1 tablet by mouth once daily   tamsulosin (FLOMAX) 0.4 MG CAPS capsule Take 1 capsule (0.4 mg total) by mouth daily.   [DISCONTINUED] hydrochlorothiazide (HYDRODIURIL) 12.5 MG tablet Take 1 tablet (12.5 mg total) by mouth daily.   [DISCONTINUED] metoprolol succinate (TOPROL-XL) 50 MG 24 hr tablet Take 1 tablet (50 mg total) by mouth daily. Take with or immediately following a meal.   [DISCONTINUED] nystatin (MYCOSTATIN) 100000 UNIT/ML suspension Take 5 mLs (500,000 Units total) by mouth 4 (four) times daily for 7 days.   [DISCONTINUED] TRULICITY 0.75 MG/0.5ML SOAJ SMARTSIG:0.5 Milliliter(s) SUB-Q  Once a Week   EPINEPHrine 0.3 mg/0.3 mL IJ SOAJ injection Inject 0.3 mg into the muscle as needed for anaphylaxis. (Patient not taking: Reported on 03/31/2023)   nystatin (MYCOSTATIN) 100000 UNIT/ML suspension Take 5 mLs (500,000 Units total) by mouth 4 (four) times daily for 7 days.   [DISCONTINUED] amoxicillin-clavulanate (AUGMENTIN) 875-125 MG tablet Take 1 tablet by mouth 2 (two) times daily. (Patient not taking: Reported on 03/31/2023)   No facility-administered encounter medications on file as of 03/31/2023.    Follow-up: Return in about 4 weeks (around 04/28/2023) for HTN.   Donell Beers, FNP

## 2023-03-31 NOTE — Patient Instructions (Addendum)
Please take nystatin 500,000  units 4 times daily; swish in the mouth and retain for as long as possible (several minutes) before swallowing. Duration is for 7    1. Type 2 diabetes mellitus without complication, without long-term current use of insulin (HCC)  - POCT glycosylated hemoglobin (Hb A1C) - Lipid panel - Microalbumin / creatinine urine ratio  2. Essential hypertension  - hydrochlorothiazide (HYDRODIURIL) 25 MG tablet; Take 1 tablet (25 mg total) by mouth daily.  Dispense: 90 tablet; Refill: 0 - Basic metabolic panel - Basic metabolic panel; Future  3. Acute pain of right shoulder Please take Tylenol 650 mg every 6 hours as needed, I encourage application of heat or ice, stretching exercises as well.  4. Thrush, oral  - nystatin (MYCOSTATIN) 100000 UNIT/ML suspension; Take 5 mLs (500,000 Units total) by mouth 4 (four) times daily for 7 days.  Dispense: 60 mL; Refill: 0 .   Around 3 times per week, check your blood pressure 2 times per day. once in the morning and once in the evening. The readings should be at least one minute apart. Write down these values and bring them to your next nurse visit/appointment.  When you check your BP, make sure you have been doing something calm/relaxing 5 minutes prior to checking. Both feet should be flat on the floor and you should be sitting. Use your left arm and make sure it is in a relaxed position (on a table), and that the cuff is at the approximate level/height of your heart.     It is important that you exercise regularly at least 30 minutes 5 times a week as tolerated  Think about what you will eat, plan ahead. Choose " clean, green, fresh or frozen" over canned, processed or packaged foods which are more sugary, salty and fatty. 70 to 75% of food eaten should be vegetables and fruit. Three meals at set times with snacks allowed between meals, but they must be fruit or vegetables. Aim to eat over a 12 hour period , example 7 am  to 7 pm, and STOP after  your last meal of the day. Drink water,generally about 64 ounces per day, no other drink is as healthy. Fruit juice is best enjoyed in a healthy way, by EATING the fruit.  Thanks for choosing [facility] we consider it a privelige to serve you.

## 2023-03-31 NOTE — Assessment & Plan Note (Signed)
BP Readings from Last 3 Encounters:  03/31/23 (!) 149/85  12/30/22 (!) 140/78  09/23/22 (!) 142/77   HTN unControlled .  Continue metoprolol 50 mg daily, amlodipine 10 mg daily, increase hydrochlorothiazide to 12.5 mg daily BMP today, BMP in 2 weeks Discussed DASH diet and dietary sodium restrictions Continue to increase dietary efforts and exercise.  Encouraged to monitor blood pressure at home BP goal is less than 130 keep a log and bring to next appointment in 4 weeks

## 2023-03-31 NOTE — Assessment & Plan Note (Signed)
Take ibuprofen 600 mg 3 times daily as needed alternate with Tylenol 650 mg every 6 hours as needed Application of heat or ice encouraged

## 2023-04-01 ENCOUNTER — Other Ambulatory Visit (HOSPITAL_COMMUNITY): Payer: Self-pay | Admitting: Nurse Practitioner

## 2023-04-01 DIAGNOSIS — E785 Hyperlipidemia, unspecified: Secondary | ICD-10-CM

## 2023-04-01 LAB — BASIC METABOLIC PANEL
BUN/Creatinine Ratio: 19 (ref 9–20)
BUN: 16 mg/dL (ref 6–24)
CO2: 22 mmol/L (ref 20–29)
Calcium: 9.8 mg/dL (ref 8.7–10.2)
Chloride: 105 mmol/L (ref 96–106)
Creatinine, Ser: 0.84 mg/dL (ref 0.76–1.27)
Glucose: 177 mg/dL — ABNORMAL HIGH (ref 70–99)
Potassium: 4.7 mmol/L (ref 3.5–5.2)
Sodium: 141 mmol/L (ref 134–144)
eGFR: 103 mL/min/{1.73_m2} (ref >59)

## 2023-04-01 LAB — LIPID PANEL
Chol/HDL Ratio: 4.9 {ratio} (ref 0.0–5.0)
Cholesterol, Total: 166 mg/dL (ref 100–199)
HDL: 34 mg/dL — ABNORMAL LOW (ref >39)
LDL Chol Calc (NIH): 102 mg/dL — ABNORMAL HIGH (ref 0–99)
Triglycerides: 169 mg/dL — ABNORMAL HIGH (ref 0–149)
VLDL Cholesterol Cal: 30 mg/dL (ref 5–40)

## 2023-04-01 MED ORDER — ATORVASTATIN CALCIUM 80 MG PO TABS: 80.0000 mg | ORAL_TABLET | Freq: Every day | ORAL | 1 refills | Status: DC

## 2023-04-02 ENCOUNTER — Other Ambulatory Visit: Payer: Self-pay | Admitting: Family Medicine

## 2023-04-02 DIAGNOSIS — E1169 Type 2 diabetes mellitus with other specified complication: Secondary | ICD-10-CM

## 2023-04-02 LAB — MICROALBUMIN / CREATININE URINE RATIO
Creatinine, Urine: 128.5 mg/dL
Microalb/Creat Ratio: 143 mg/g{creat} — ABNORMAL HIGH (ref 0–29)
Microalbumin, Urine: 184.3 ug/mL

## 2023-04-03 ENCOUNTER — Other Ambulatory Visit: Payer: Self-pay | Admitting: Family Medicine

## 2023-04-03 ENCOUNTER — Other Ambulatory Visit (HOSPITAL_COMMUNITY): Payer: Self-pay | Admitting: Nurse Practitioner

## 2023-04-03 DIAGNOSIS — E1169 Type 2 diabetes mellitus with other specified complication: Secondary | ICD-10-CM

## 2023-04-03 MED ORDER — OXYBUTYNIN CHLORIDE ER 10 MG PO TB24: 10.0000 mg | ORAL_TABLET | Freq: Every day | ORAL | 1 refills | Status: DC

## 2023-04-16 ENCOUNTER — Other Ambulatory Visit: Payer: Self-pay | Admitting: Nurse Practitioner

## 2023-04-16 DIAGNOSIS — I10 Essential (primary) hypertension: Secondary | ICD-10-CM

## 2023-04-21 ENCOUNTER — Other Ambulatory Visit: Payer: 59

## 2023-04-21 DIAGNOSIS — I10 Essential (primary) hypertension: Secondary | ICD-10-CM

## 2023-04-22 LAB — BASIC METABOLIC PANEL
BUN/Creatinine Ratio: 14 (ref 9–20)
BUN: 13 mg/dL (ref 6–24)
CO2: 22 mmol/L (ref 20–29)
Calcium: 9.3 mg/dL (ref 8.7–10.2)
Chloride: 103 mmol/L (ref 96–106)
Creatinine, Ser: 0.9 mg/dL (ref 0.76–1.27)
Glucose: 212 mg/dL — ABNORMAL HIGH (ref 70–99)
Potassium: 4 mmol/L (ref 3.5–5.2)
Sodium: 140 mmol/L (ref 134–144)
eGFR: 101 mL/min/{1.73_m2} (ref >59)

## 2023-05-04 ENCOUNTER — Other Ambulatory Visit: Payer: Self-pay | Admitting: Family Medicine

## 2023-05-04 DIAGNOSIS — E119 Type 2 diabetes mellitus without complications: Secondary | ICD-10-CM

## 2023-05-05 ENCOUNTER — Ambulatory Visit (INDEPENDENT_AMBULATORY_CARE_PROVIDER_SITE_OTHER): Payer: Self-pay | Admitting: Nurse Practitioner

## 2023-05-05 ENCOUNTER — Encounter: Payer: Self-pay | Admitting: Nurse Practitioner

## 2023-05-05 VITALS — BP 135/84 | HR 81 | Temp 97.8°F | Wt 334.0 lb

## 2023-05-05 DIAGNOSIS — E785 Hyperlipidemia, unspecified: Secondary | ICD-10-CM

## 2023-05-05 DIAGNOSIS — J302 Other seasonal allergic rhinitis: Secondary | ICD-10-CM

## 2023-05-05 DIAGNOSIS — I10 Essential (primary) hypertension: Secondary | ICD-10-CM

## 2023-05-05 DIAGNOSIS — K219 Gastro-esophageal reflux disease without esophagitis: Secondary | ICD-10-CM

## 2023-05-05 DIAGNOSIS — R351 Nocturia: Secondary | ICD-10-CM

## 2023-05-05 MED ORDER — SALINE SPRAY 0.65 % NA SOLN: 1.0000 | NASAL | Status: AC | PRN

## 2023-05-05 MED ORDER — METOPROLOL SUCCINATE ER 50 MG PO TB24: 50.0000 mg | ORAL_TABLET | Freq: Every day | ORAL | 1 refills | Status: DC

## 2023-05-05 MED ORDER — TAMSULOSIN HCL 0.4 MG PO CAPS: 0.4000 mg | ORAL_CAPSULE | Freq: Every day | ORAL | 3 refills | Status: DC

## 2023-05-05 MED ORDER — HYDROCHLOROTHIAZIDE 25 MG PO TABS: 25.0000 mg | ORAL_TABLET | Freq: Every day | ORAL | 1 refills | Status: DC

## 2023-05-05 MED ORDER — OMEPRAZOLE 40 MG PO CPDR: DELAYED_RELEASE_CAPSULE | ORAL | 0 refills | Status: DC

## 2023-05-05 NOTE — Assessment & Plan Note (Addendum)
 On tamsulosin 0.4 mg daily, oxybutynin 10 mg daily Tamsulosin refilled

## 2023-05-05 NOTE — Assessment & Plan Note (Signed)
 BP Readings from Last 3 Encounters:  05/05/23 135/84  03/31/23 (!) 149/85  12/30/22 (!) 140/78  Blood pressure is much improved on amlodipine  10 mg daily, metoprolol  50 mg daily, hydrochlorothiazide  25 mg daily, medications last taking less than 2 hours ago Patient encouraged to monitor blood pressure at home, blood pressure goal is less than 130/80 Discussed DASH diet and dietary sodium restrictions Continue to increase dietary efforts and exercise.

## 2023-05-05 NOTE — Assessment & Plan Note (Signed)
 Controlled on omeprazole 40 mg daily Medication refilled

## 2023-05-05 NOTE — Assessment & Plan Note (Addendum)
 Lab Results  Component Value Date   CHOL 166 03/31/2023   HDL 34 (L) 03/31/2023   LDLCALC 102 (H) 03/31/2023   TRIG 169 (H) 03/31/2023   CHOLHDL 4.9 03/31/2023  LDL goal is less than 70 Checking lipid panel Currently on atorvastatin  80 mg daily Avoid fatty fried foods lose weight

## 2023-05-05 NOTE — Patient Instructions (Addendum)
Around 3 times per week, check your blood pressure 2 times per day. once in the morning and once in the evening. The readings should be at least one minute apart. Write down these values and bring them to your next nurse visit/appointment.  When you check your BP, make sure you have been doing something calm/relaxing 5 minutes prior to checking. Both feet should be flat on the floor and you should be sitting. Use your left arm and make sure it is in a relaxed position (on a table), and that the cuff is at the approximate level/height of your heart.   Blood pressure goal is less than 130/80.    It is important that you exercise regularly at least 30 minutes 5 times a week as tolerated  Think about what you will eat, plan ahead. Choose " clean, green, fresh or frozen" over canned, processed or packaged foods which are more sugary, salty and fatty. 70 to 75% of food eaten should be vegetables and fruit. Three meals at set times with snacks allowed between meals, but they must be fruit or vegetables. Aim to eat over a 12 hour period , example 7 am to 7 pm, and STOP after  your last meal of the day. Drink water,generally about 64 ounces per day, no other drink is as healthy. Fruit juice is best enjoyed in a healthy way, by EATING the fruit.  Thanks for choosing [facility] we consider it a privelige to serve you.

## 2023-05-05 NOTE — Progress Notes (Signed)
 Established Patient Office Visit  Subjective:  Patient ID: Scott Buchanan, male    DOB: [birth year]  Age: 58 y.o. MRN: [id number]  CC:  Chief Complaint  Patient presents with   Hypertension    HPI Scott Buchanan is a 58 y.o. male  has a past medical history of Arthritis, Asthma, Diabetes mellitus without complication (HCC), Fatty liver (11/20/2019), Hyperlipidemia associated with type 2 diabetes mellitus (HCC) (05/14/2018), Hypertension, Morbid obesity (HCC) (07/27/2017), Primary localized osteoarthritis of right knee (03/02/2018), Seasonal allergies (11/09/2013), Severe persistent asthma without complication (05/16/2021), and Type 2 diabetes mellitus without complication, without long-term current use of insulin  (HCC) (05/12/2017).  Patient presents for follow-up for hypertension and hyperlipidemia  Hypertension.  Currently on metoprolol  50 mg daily, amlodipine  10 mg daily, hydrochlorothiazide  25 mg daily.  Has a blood pressure cuff at home but has not been monitoring his blood pressure, gets a lot of walking done at work.  No complaints of shortness of breath chest pain dizziness  Hyperlipidemia.  Taking atorvastatin  80 mg daily  Allergies.  Currently on Flonase  nasal spray 2 spray into both nostrils daily, montelukast  10 mg daily, level cetirizine 5 mg daily.  Stated that he was on injections for allergies in the past stopped seeing the allergist due to cost of the injections.  He reports nasal congestion ,patient denies fever cough, shortness of breath,    Past Medical History:  Diagnosis Date   Arthritis    Asthma    Diabetes mellitus without complication (HCC)    type 2   Fatty liver 11/20/2019   Hyperlipidemia associated with type 2 diabetes mellitus (HCC) 05/14/2018   Hypertension    Morbid obesity (HCC) 07/27/2017   Primary localized osteoarthritis of right knee 03/02/2018   Seasonal allergies 11/09/2013   Severe persistent asthma without complication 05/16/2021   Type 2  diabetes mellitus without complication, without long-term current use of insulin  (HCC) 05/12/2017    Past Surgical History:  Procedure Laterality Date   CYSTECTOMY     tailbone   ETHMOIDECTOMY Bilateral 04/07/2017   Procedure: BILATERAL ETHMOIDECTOMY AND SPHENOIDECTOMY;  Surgeon: Karis Clunes, MD;  Location: [facility];  Service: ENT;  Laterality: Bilateral;   KNEE ARTHROSCOPY W/ MENISCAL REPAIR     right and left knee one year apart   LEFT HEART CATH AND CORONARY ANGIOGRAPHY N/A 09/04/2020   Procedure: LEFT HEART CATH AND CORONARY ANGIOGRAPHY;  Surgeon: Jordan, Peter M, MD;  Location: [facility];  Service: Cardiovascular;  Laterality: N/A;   PARTIAL KNEE ARTHROPLASTY Right 03/02/2018   Procedure: UNICOMPARTMENTAL KNEE;  Surgeon: Josefina Chew, MD;  Location: [facility];  Service: Orthopedics;  Laterality: Right;  with block   SINUS ENDO WITH FUSION Bilateral 04/07/2017   Procedure: BILATERAL FRONTAL RECESS SINUS EXPLORATION WITH SINUS FUSION NAVIGATION;  Surgeon: Karis Clunes, MD;  Location: [facility];  Service: ENT;  Laterality: Bilateral;   SINUS EXPLORATION     TONSILLECTOMY     TURBINATE REDUCTION Bilateral 04/07/2017   Procedure: BILATERAL TURBINATE REDUCTION;  Surgeon: Karis Clunes, MD;  Location: [facility];  Service: ENT;  Laterality: Bilateral;   unicompartmental right knee      Family History  Problem Relation Age of Onset   Hypertension Mother    Colon cancer Neg Hx    Colon polyps Neg Hx    Stomach cancer Neg Hx    Rectal cancer Neg Hx    Esophageal cancer Neg Hx    Inflammatory  bowel disease Neg Hx    Liver disease Neg Hx    Pancreatic cancer Neg Hx     Social History   Socioeconomic History   Marital status: Single    Spouse name: Not on file   Number of children: 1   Years of education: Not on file   Highest education level: Bachelor's degree (e.g., BA, AB, BS)  Occupational History   Occupation: maintance   Tobacco  Use   Smoking status: Former    Types: Cigarettes   Smokeless tobacco: Never   Tobacco comments:    quit 10 years ago  Vaping Use   Vaping status: Never Used  Substance and Sexual Activity   Alcohol use: No   Drug use: No   Sexual activity: Yes  Other Topics Concern   Not on file  Social History Narrative   Not on file   Social Drivers of Health   Financial Resource Strain: Medium Risk (05/04/2023)   Overall Financial Resource Strain (CARDIA)    Difficulty of Paying Living Expenses: Somewhat hard  Food Insecurity: No Food Insecurity (05/04/2023)   Hunger Vital Sign    Worried About Running Out of Food in the Last Year: Never true    Ran Out of Food in the Last Year: Never true  Transportation Needs: No Transportation Needs (05/04/2023)   PRAPARE - Administrator, Civil Service (Medical): No    Lack of Transportation (Non-Medical): No  Physical Activity: Insufficiently Active (05/04/2023)   Exercise Vital Sign    Days of Exercise per Week: 5 days    Minutes of Exercise per Session: 10 min  Stress: No Stress Concern Present (05/04/2023)   [employer] of Occupational Health - Occupational Stress Questionnaire    Feeling of Stress : Only a little  Social Connections: Moderately Isolated (05/04/2023)   Social Connection and Isolation Panel [NHANES]    Frequency of Communication with Friends and Family: Twice a week    Frequency of Social Gatherings with Friends and Family: Once a week    Attends Religious Services: 1 to 4 times per year    Active Member of [employer] or Organizations: No    Attends Engineer, Structural: Not on file    Marital Status: Never married  Intimate Partner Violence: Not on file    Outpatient Medications Prior to Visit  Medication Sig Dispense Refill   acetaminophen  (TYLENOL ) 650 MG CR tablet Take 1,300 mg by mouth every 8 (eight) hours as needed for pain.     albuterol  (VENTOLIN  HFA) 108 (90 Base) MCG/ACT inhaler Inhale 2 puffs  into the lungs every 4 (four) hours as needed for wheezing or shortness of breath. 18 g 3   amLODipine  (NORVASC ) 10 MG tablet Take 1 tablet by mouth once daily 90 tablet 0   Ascorbic Acid  (VITAMIN C ) 1000 MG tablet Take 1,000 mg by mouth daily.     aspirin  EC 81 MG tablet Take 81 mg by mouth daily.     atorvastatin  (LIPITOR) 80 MG tablet Take 1 tablet (80 mg total) by mouth daily. 90 tablet 1   cholecalciferol  (VITAMIN D ) 1000 units tablet Take 1,000 Units by mouth daily.     Dulaglutide  (TRULICITY ) 1.5 MG/0.5ML SOAJ Inject 1.5 mg into the skin once a week. 2 mL 0   fluticasone  (FLONASE ) 50 MCG/ACT nasal spray USE 2 SPRAY(S) IN EACH NOSTRIL AT BEDTIME 16 g 0   gabapentin  (NEURONTIN ) 300 MG capsule Take 1 capsule (300 mg total)  by mouth at bedtime. 90 capsule 0   glipiZIDE  (GLUCOTROL  XL) 5 MG 24 hr tablet Take 1 tablet by mouth once daily with breakfast 60 tablet 0   levocetirizine (XYZAL ) 5 MG tablet TAKE 1 TABLET BY MOUTH ONCE DAILY IN THE EVENING 90 tablet 0   metFORMIN  (GLUCOPHAGE ) 1000 MG tablet TAKE 1 TABLET BY MOUTH TWICE DAILY WITH A MEAL . APPOINTMENT REQUIRED FOR FUTURE REFILLS 180 tablet 0   montelukast  (SINGULAIR ) 10 MG tablet TAKE 1 TABLET BY MOUTH AT BEDTIME 90 tablet 0   oxybutynin  (DITROPAN -XL) 10 MG 24 hr tablet Take 1 tablet (10 mg total) by mouth daily. 90 tablet 1   hydrochlorothiazide  (HYDRODIURIL ) 25 MG tablet Take 1 tablet (25 mg total) by mouth daily. 90 tablet 0   metoprolol  succinate (TOPROL -XL) 50 MG 24 hr tablet Take 1 tablet (50 mg total) by mouth daily. Take with or immediately following a meal. 90 tablet 0   omeprazole  (PRILOSEC) 40 MG capsule TAKE 1 CAPSULE BY MOUTH ONCE DAILY 90 capsule 0   tamsulosin  (FLOMAX ) 0.4 MG CAPS capsule Take 1 capsule (0.4 mg total) by mouth daily. 90 capsule 3   EPINEPHrine  0.3 mg/0.3 mL IJ SOAJ injection Inject 0.3 mg into the muscle as needed for anaphylaxis. (Patient not taking: Reported on 05/05/2023) 2 each 1   hydrochlorothiazide   (HYDRODIURIL ) 12.5 MG tablet Take 1 tablet by mouth once daily 90 tablet 0   ibuprofen  (ADVIL ) 600 MG tablet Take 1 tablet (600 mg total) by mouth every 8 (eight) hours as needed. 15 tablet 0   No facility-administered medications prior to visit.    No Known Allergies  ROS Review of Systems  Constitutional:  Negative for appetite change, chills, fatigue and fever.  HENT:  Positive for congestion. Negative for dental problem, drooling, ear discharge and ear pain.   Respiratory:  Negative for cough, shortness of breath and wheezing.   Cardiovascular:  Negative for chest pain, palpitations and leg swelling.  Gastrointestinal:  Negative for abdominal pain, constipation, nausea and vomiting.  Genitourinary:  Negative for difficulty urinating, dysuria, flank pain and frequency.  Musculoskeletal:  Negative for arthralgias, back pain, joint swelling and myalgias.  Skin:  Negative for color change, pallor, rash and wound.  Neurological:  Negative for dizziness, facial asymmetry, weakness, numbness and headaches.  Psychiatric/Behavioral:  Negative for behavioral problems, confusion, self-injury and suicidal ideas.       Objective:    Physical Exam Vitals and nursing note reviewed.  Constitutional:      General: He is not in acute distress.    Appearance: Normal appearance. He is obese. He is not ill-appearing, toxic-appearing or diaphoretic.  HENT:     Nose: No congestion or rhinorrhea.     Mouth/Throat:     Mouth: Mucous membranes are moist.     Pharynx: Oropharynx is clear. No oropharyngeal exudate or posterior oropharyngeal erythema.  Eyes:     General: No scleral icterus.       Right eye: No discharge.        Left eye: No discharge.     Extraocular Movements: Extraocular movements intact.     Conjunctiva/sclera: Conjunctivae normal.  Cardiovascular:     Rate and Rhythm: Normal rate and regular rhythm.     Pulses: Normal pulses.     Heart sounds: Normal heart sounds. No murmur  heard.    No friction rub. No gallop.  Pulmonary:     Effort: Pulmonary effort is normal. No respiratory distress.  Breath sounds: Normal breath sounds. No stridor. No wheezing, rhonchi or rales.  Chest:     Chest wall: No tenderness.  Abdominal:     General: There is no distension.     Palpations: Abdomen is soft.     Tenderness: There is no abdominal tenderness. There is no right CVA tenderness, left CVA tenderness or guarding.  Musculoskeletal:        General: No swelling, tenderness, deformity or signs of injury.     Right lower leg: No edema.     Left lower leg: No edema.  Skin:    General: Skin is warm and dry.     Capillary Refill: Capillary refill takes less than 2 seconds.     Coloration: Skin is not jaundiced or pale.     Findings: No bruising, erythema or lesion.  Neurological:     Mental Status: He is alert and oriented to person, place, and time.     Motor: No weakness.     Coordination: Coordination normal.     Gait: Gait normal.  Psychiatric:        Mood and Affect: Mood normal.        Behavior: Behavior normal.        Thought Content: Thought content normal.        Judgment: Judgment normal.     BP 135/84   Pulse 81   Temp 97.8 F (36.6 C)   Wt (!) 334 lb (151.5 kg)   SpO2 98%   BMI 40.66 kg/m  Wt Readings from Last 3 Encounters:  05/05/23 (!) 334 lb (151.5 kg)  03/31/23 (!) 328 lb (148.8 kg)  12/30/22 (!) 327 lb (148.3 kg)    Lab Results  Component Value Date   TSH 1.270 03/13/2020   Lab Results  Component Value Date   WBC 7.7 03/18/2021   HGB 14.3 03/18/2021   HCT 42.7 03/18/2021   MCV 85 03/18/2021   PLT 397 03/18/2021   Lab Results  Component Value Date   NA 140 04/21/2023   K 4.0 04/21/2023   CO2 22 04/21/2023   GLUCOSE 212 (H) 04/21/2023   BUN 13 04/21/2023   CREATININE 0.90 04/21/2023   BILITOT 0.4 12/30/2022   ALKPHOS 57 12/30/2022   AST 28 12/30/2022   ALT 51 (H) 12/30/2022   PROT 7.0 12/30/2022   ALBUMIN 4.5  12/30/2022   CALCIUM  9.3 04/21/2023   ANIONGAP 13 06/11/2020   EGFR 101 04/21/2023   Lab Results  Component Value Date   CHOL 166 03/31/2023   Lab Results  Component Value Date   HDL 34 (L) 03/31/2023   Lab Results  Component Value Date   LDLCALC 102 (H) 03/31/2023   Lab Results  Component Value Date   TRIG 169 (H) 03/31/2023   Lab Results  Component Value Date   CHOLHDL 4.9 03/31/2023   Lab Results  Component Value Date   HGBA1C 7.4 (A) 03/31/2023      Assessment & Plan:   Problem List Items Addressed This Visit       Cardiovascular and Mediastinum   Essential hypertension   BP Readings from Last 3 Encounters:  05/05/23 135/84  03/31/23 (!) 149/85  12/30/22 (!) 140/78  Blood pressure is much improved on amlodipine  10 mg daily, metoprolol  50 mg daily, hydrochlorothiazide  25 mg daily, medications last taking less than 2 hours ago Patient encouraged to monitor blood pressure at home, blood pressure goal is less than 130/80 Discussed DASH diet and dietary  sodium restrictions Continue to increase dietary efforts and exercise.         Relevant Medications   metoprolol  succinate (TOPROL -XL) 50 MG 24 hr tablet   hydrochlorothiazide  (HYDRODIURIL ) 25 MG tablet     Digestive   Gastroesophageal reflux disease without esophagitis   Controlled on omeprazole  40 mg daily Medication refilled      Relevant Medications   omeprazole  (PRILOSEC) 40 MG capsule     Other   Dyslipidemia, goal LDL below 70 - Primary   Lab Results  Component Value Date   CHOL 166 03/31/2023   HDL 34 (L) 03/31/2023   LDLCALC 102 (H) 03/31/2023   TRIG 169 (H) 03/31/2023   CHOLHDL 4.9 03/31/2023  LDL goal is less than 70 Checking lipid panel Currently on atorvastatin  80 mg daily Avoid fatty fried foods lose weight      Relevant Medications   metoprolol  succinate (TOPROL -XL) 50 MG 24 hr tablet   hydrochlorothiazide  (HYDRODIURIL ) 25 MG tablet   Other Relevant Orders   Lipid panel    Seasonal allergies   Sample of saline nasal spray provided in the office today Continue Flonase  nasal spray 2 spray into both nostrils daily, singular 10 mg daily cetirizine 5 mg daily      Relevant Medications   sodium chloride  (OCEAN) 0.65 % SOLN nasal spray   Nocturia   On tamsulosin  0.4 mg daily, oxybutynin  10 mg daily Tamsulosin  refilled      Relevant Medications   tamsulosin  (FLOMAX ) 0.4 MG CAPS capsule    Meds ordered this encounter  Medications   metoprolol  succinate (TOPROL -XL) 50 MG 24 hr tablet    Sig: Take 1 tablet (50 mg total) by mouth daily. Take with or immediately following a meal.    Dispense:  90 tablet    Refill:  1   omeprazole  (PRILOSEC) 40 MG capsule    Sig: TAKE 1 CAPSULE BY MOUTH ONCE DAILY    Dispense:  90 capsule    Refill:  0   tamsulosin  (FLOMAX ) 0.4 MG CAPS capsule    Sig: Take 1 capsule (0.4 mg total) by mouth daily.    Dispense:  90 capsule    Refill:  3   hydrochlorothiazide  (HYDRODIURIL ) 25 MG tablet    Sig: Take 1 tablet (25 mg total) by mouth daily.    Dispense:  90 tablet    Refill:  1   sodium chloride  (OCEAN) 0.65 % SOLN nasal spray    Sig: Place 1 spray into both nostrils as needed.    Follow-up: Return in about 2 months (around 07/03/2023) for DM.    Cheresa Siers R Thaxton Pelley, FNP

## 2023-05-05 NOTE — Assessment & Plan Note (Signed)
 Sample of saline nasal spray provided in the office today Continue Flonase nasal spray 2 spray into both nostrils daily, singular 10 mg daily cetirizine 5 mg daily

## 2023-05-06 ENCOUNTER — Other Ambulatory Visit: Payer: Self-pay | Admitting: Nurse Practitioner

## 2023-05-06 DIAGNOSIS — E785 Hyperlipidemia, unspecified: Secondary | ICD-10-CM

## 2023-05-06 LAB — LIPID PANEL
Chol/HDL Ratio: 5.2 {ratio} — ABNORMAL HIGH (ref 0.0–5.0)
Cholesterol, Total: 165 mg/dL (ref 100–199)
HDL: 32 mg/dL — ABNORMAL LOW (ref >39)
LDL Chol Calc (NIH): 103 mg/dL — ABNORMAL HIGH (ref 0–99)
Triglycerides: 173 mg/dL — ABNORMAL HIGH (ref 0–149)
VLDL Cholesterol Cal: 30 mg/dL (ref 5–40)

## 2023-05-08 ENCOUNTER — Other Ambulatory Visit: Payer: Self-pay | Admitting: Nurse Practitioner

## 2023-05-08 LAB — LDL CHOLESTEROL, DIRECT: LDL Direct: 98 mg/dL (ref 0–99)

## 2023-05-08 LAB — SPECIMEN STATUS REPORT

## 2023-05-08 MED ORDER — EZETIMIBE 10 MG PO TABS: 10.0000 mg | ORAL_TABLET | Freq: Every day | ORAL | 1 refills | Status: DC

## 2023-05-09 ENCOUNTER — Other Ambulatory Visit: Payer: Self-pay | Admitting: Nurse Practitioner

## 2023-05-10 ENCOUNTER — Other Ambulatory Visit: Payer: Self-pay | Admitting: Nurse Practitioner

## 2023-05-11 ENCOUNTER — Telehealth: Payer: Self-pay

## 2023-05-11 NOTE — Telephone Encounter (Signed)
-----   Message from [place] sent at 05/08/2023  6:48 PM EST ----- Please call the patient when you get a chance to make sure he understands the instructions and recommendations since have made changes to his medications ----- Message ----- From: Interface, [facility] Lab Results In Sent: 05/06/2023   5:38 AM EST To: Donell Beers, FNP

## 2023-05-11 NOTE — Telephone Encounter (Signed)
Pt was advised [facility] 

## 2023-05-14 ENCOUNTER — Other Ambulatory Visit: Payer: Self-pay

## 2023-05-14 LAB — HM DIABETES EYE EXAM

## 2023-05-14 MED ORDER — FLUTICASONE PROPIONATE 50 MCG/ACT NA SUSP: 2.0000 | Freq: Every day | NASAL | 0 refills | Status: DC

## 2023-06-01 ENCOUNTER — Other Ambulatory Visit: Payer: Self-pay | Admitting: Family Medicine

## 2023-06-01 ENCOUNTER — Other Ambulatory Visit: Payer: Self-pay

## 2023-06-01 DIAGNOSIS — J3089 Other allergic rhinitis: Secondary | ICD-10-CM

## 2023-06-01 DIAGNOSIS — E1169 Type 2 diabetes mellitus with other specified complication: Secondary | ICD-10-CM

## 2023-06-01 DIAGNOSIS — I10 Essential (primary) hypertension: Secondary | ICD-10-CM

## 2023-06-01 MED ORDER — MONTELUKAST SODIUM 10 MG PO TABS: 10.0000 mg | ORAL_TABLET | Freq: Every day | ORAL | 0 refills | Status: DC

## 2023-06-01 MED ORDER — AMLODIPINE BESYLATE 10 MG PO TABS: 10.0000 mg | ORAL_TABLET | Freq: Every day | ORAL | 0 refills | Status: DC

## 2023-06-01 MED ORDER — GLIPIZIDE ER 5 MG PO TB24: 5.0000 mg | ORAL_TABLET | Freq: Every day | ORAL | 0 refills | Status: DC

## 2023-06-14 ENCOUNTER — Other Ambulatory Visit: Payer: Self-pay | Admitting: Family Medicine

## 2023-06-14 DIAGNOSIS — Z9109 Other allergy status, other than to drugs and biological substances: Secondary | ICD-10-CM

## 2023-06-18 ENCOUNTER — Other Ambulatory Visit: Payer: Self-pay

## 2023-06-18 DIAGNOSIS — E889 Metabolic disorder, unspecified: Secondary | ICD-10-CM

## 2023-06-18 DIAGNOSIS — Z9109 Other allergy status, other than to drugs and biological substances: Secondary | ICD-10-CM

## 2023-06-18 NOTE — Telephone Encounter (Signed)
 Please advise [facility]

## 2023-06-19 MED ORDER — GABAPENTIN 300 MG PO CAPS: 300.0000 mg | ORAL_CAPSULE | Freq: Every day | ORAL | 0 refills | Status: DC

## 2023-07-07 ENCOUNTER — Ambulatory Visit (INDEPENDENT_AMBULATORY_CARE_PROVIDER_SITE_OTHER): Payer: Self-pay | Admitting: Nurse Practitioner

## 2023-07-07 ENCOUNTER — Encounter: Payer: Self-pay | Admitting: Nurse Practitioner

## 2023-07-07 VITALS — BP 151/90 | HR 79 | Temp 97.4°F | Wt 331.6 lb

## 2023-07-07 DIAGNOSIS — E785 Hyperlipidemia, unspecified: Secondary | ICD-10-CM

## 2023-07-07 DIAGNOSIS — I10 Essential (primary) hypertension: Secondary | ICD-10-CM

## 2023-07-07 DIAGNOSIS — E119 Type 2 diabetes mellitus without complications: Secondary | ICD-10-CM

## 2023-07-07 LAB — POCT GLYCOSYLATED HEMOGLOBIN (HGB A1C): Hemoglobin A1C: 9.6 % — AB (ref 4.0–5.6)

## 2023-07-07 MED ORDER — AMLODIPINE BESYLATE 10 MG PO TABS: 10.0000 mg | ORAL_TABLET | Freq: Every day | ORAL | 2 refills | Status: DC

## 2023-07-07 MED ORDER — GLIPIZIDE ER 10 MG PO TB24: 10.0000 mg | ORAL_TABLET | Freq: Every day | ORAL | 1 refills | Status: DC

## 2023-07-07 MED ORDER — METOPROLOL SUCCINATE ER 100 MG PO TB24: 100.0000 mg | ORAL_TABLET | Freq: Every day | ORAL | 3 refills | Status: DC

## 2023-07-07 NOTE — Assessment & Plan Note (Signed)
 Wt Readings from Last 3 Encounters:  07/07/23 (!) 331 lb 9.6 oz (150.4 kg)  05/05/23 (!) 334 lb (151.5 kg)  03/31/23 (!) 328 lb (148.8 kg)   Body mass index is 40.36 kg/m.  Patient counseled on low-carb diet Encouraged engage in regular moderate to vigorous exercise at least 250 minutes weekly as tolerated Was on Trulicity but has stopped taking the medication due to cost I have referred the patient to the clinical pharmacist for medication assistance for Trulicity

## 2023-07-07 NOTE — Assessment & Plan Note (Signed)
 Currently on atorvastatin 80 mg daily, Zetia 10 mg daily LDL goal is less than 70 checking lipid panel Avoid fatty fried foods  Lab Results  Component Value Date   CHOL 165 05/05/2023   HDL 32 (L) 05/05/2023   LDLCALC 103 (H) 05/05/2023   LDLDIRECT 98 05/05/2023   TRIG 173 (H) 05/05/2023   CHOLHDL 5.2 (H) 05/05/2023

## 2023-07-07 NOTE — Patient Instructions (Addendum)
 Goal for fasting blood sugar ranges from 80 to 120 and 2 hours after any meal or at bedtime should be between 130 to 170.   Around 3 times per week, check your blood pressure 2 times per day. once in the morning and once in the evening. The readings should be at least one minute apart. Write down these values and bring them to your next nurse visit/appointment.  When you check your BP, make sure you have been doing something calm/relaxing 5 minutes prior to checking. Both feet should be flat on the floor and you should be sitting. Use your left arm and make sure it is in a relaxed position (on a table), and that the cuff is at the approximate level/height of your heart. Blood pressure goal is less than 130/80  Please consider getting Shingrix and Pneumococcal  vaccine at local pharmacy.    1. Type 2 diabetes mellitus with obesity (HCC) (Primary)  - POCT glycosylated hemoglobin (Hb A1C) - glipiZIDE (GLUCOTROL XL) 10 MG 24 hr tablet; Take 1 tablet (10 mg total) by mouth daily with breakfast.  Dispense: 90 tablet; Refill: 1 - AMB Referral [facility]  2. Essential hypertension  - metoprolol succinate (TOPROL-XL) 100 MG 24 hr tablet; Take 1 tablet (100 mg total) by mouth daily. Take with or immediately following a meal.  Dispense: 90 tablet; Refill: 3    It is important that you exercise regularly at least 30 minutes 5 times a week as tolerated  Think about what you will eat, plan ahead. Choose " clean, green, fresh or frozen" over canned, processed or packaged foods which are more sugary, salty and fatty. 70 to 75% of food eaten should be vegetables and fruit. Three meals at set times with snacks allowed between meals, but they must be fruit or vegetables. Aim to eat over a 12 hour period , example 7 am to 7 pm, and STOP after  your last meal of the day. Drink water,generally about 64 ounces per day, no other drink is as healthy. Fruit juice is best enjoyed in a healthy way, by EATING  the fruit.  Thanks for choosing [facility] we consider it a privelige to serve you.

## 2023-07-07 NOTE — Assessment & Plan Note (Signed)
 Currently on amlodipine 10 mg daily, hydrochlorothiazide 25 mg daily, metoprolol 50 mg daily.  Increased metoprolol to 100 mg daily, continue amlodipine 10 mg daily, hydrochlorothiazide 25 mg daily  DASH diet and commitment to daily physical activity for a minimum of 30 minutes discussed and encouraged, as a part of hypertension management. The importance of attaining a healthy weight is also discussed.     07/07/2023    8:23 AM 07/07/2023    8:21 AM 07/07/2023    8:10 AM 05/05/2023    9:35 AM 05/05/2023    9:02 AM 05/05/2023    8:51 AM 03/31/2023    9:30 AM  BP/Weight  Systolic BP 151 153 163 135 148 140 149  Diastolic BP 90 94 86 84 81 81 85  Wt. (Lbs)   331.6   334   BMI   40.36 kg/m2   40.66 kg/m2

## 2023-07-07 NOTE — Assessment & Plan Note (Addendum)
 Lab Results  Component Value Date   HGBA1C 9.6 (A) 07/07/2023  Chronic uncontrolled condition Continue metformin 1000 mg twice daily, increase glipizide to 10 mg daily Will refer the patient to the clinical pharmacist for assistance with cost of Trulicity Patient counseled on low-carb diet, encouraged to lose weight CBG goals discussed Diabetes foot exam completed Follow-up in 4 weeks     - POCT glycosylated hemoglobin (Hb A1C) - glipiZIDE (GLUCOTROL XL) 10 MG 24 hr tablet; Take 1 tablet (10 mg total) by mouth daily with breakfast.  Dispense: 90 tablet; Refill: 1 - AMB Referral [facility] Care Management

## 2023-07-07 NOTE — Progress Notes (Signed)
 Established Patient Office Visit  Subjective:  Patient ID: Scott Buchanan, male    DOB: April 25, 1965  Age: 58 y.o. MRN: [id number]  CC: No chief complaint on file.   HPI Scott Buchanan is a 58 y.o. male.  has a past medical history of Arthritis, Asthma, Diabetes mellitus without complication (HCC), Fatty liver (11/20/2019), Hyperlipidemia associated with type 2 diabetes mellitus (HCC) (05/14/2018), Hypertension, Morbid obesity (HCC) (07/27/2017), Primary localized osteoarthritis of right knee (03/02/2018), Seasonal allergies (11/09/2013), Severe persistent asthma without complication (05/16/2021), and Type 2 diabetes mellitus without complication, without long-term current use of insulin (HCC) (05/12/2017).  Patient present for follow-up for his chronic medical conditions  Hypertension.  Currently on amlodipine 10 mg daily, hydrochlorothiazide 25 mg daily, metoprolol 50 mg daily.  States that his home blood pressure readings has been between 1 45-1 50s over 80s to 90s.  Patient denies chest pain shortness of breath edema  Uncontrolled type 2 diabetes.  Currently on glipizide 5 mg daily, metformin 1000 mg twice daily, stated that he stopped taking Trulicity 1.5 mg injection about a month ago because of the cost of the medication, patient states that his diet can be better.  No polyphagia polyuria polydipsia.  Takes Zetia 10 mg daily and atorvastatin 40 mg daily for hyperlipidemia   Patient encouraged to get shingles vaccine and pneumococcal vaccine at the pharmacy,     Past Medical History:  Diagnosis Date   Arthritis    Asthma    Diabetes mellitus without complication (HCC)    type 2   Fatty liver 11/20/2019   Hyperlipidemia associated with type 2 diabetes mellitus (HCC) 05/14/2018   Hypertension    Morbid obesity (HCC) 07/27/2017   Primary localized osteoarthritis of right knee 03/02/2018   Seasonal allergies 11/09/2013   Severe persistent asthma without complication 05/16/2021    Type 2 diabetes mellitus without complication, without long-term current use of insulin (HCC) 05/12/2017    Past Surgical History:  Procedure Laterality Date   CYSTECTOMY     tailbone   ETHMOIDECTOMY Bilateral 04/07/2017   Procedure: BILATERAL ETHMOIDECTOMY AND SPHENOIDECTOMY;  Surgeon: Newman Pies, MD;  Location: [facility];  Service: ENT;  Laterality: Bilateral;   KNEE ARTHROSCOPY W/ MENISCAL REPAIR     right and left knee one year apart   LEFT HEART CATH AND CORONARY ANGIOGRAPHY N/A 09/04/2020   Procedure: LEFT HEART CATH AND CORONARY ANGIOGRAPHY;  Surgeon: Swaziland, Peter M, MD;  Location: [facility];  Service: Cardiovascular;  Laterality: N/A;   PARTIAL KNEE ARTHROPLASTY Right 03/02/2018   Procedure: UNICOMPARTMENTAL KNEE;  Surgeon: Teryl Lucy, MD;  Location: [facility];  Service: Orthopedics;  Laterality: Right;  with block   SINUS ENDO WITH FUSION Bilateral 04/07/2017   Procedure: BILATERAL FRONTAL RECESS SINUS EXPLORATION WITH SINUS FUSION NAVIGATION;  Surgeon: Newman Pies, MD;  Location: [facility];  Service: ENT;  Laterality: Bilateral;   SINUS EXPLORATION     TONSILLECTOMY     TURBINATE REDUCTION Bilateral 04/07/2017   Procedure: BILATERAL TURBINATE REDUCTION;  Surgeon: Newman Pies, MD;  Location:  SURGERY CENTER;  Service: ENT;  Laterality: Bilateral;   unicompartmental right knee      Family History  Problem Relation Age of Onset   Hypertension Mother    Colon cancer Neg Hx    Colon polyps Neg Hx    Stomach cancer Neg Hx    Rectal cancer Neg Hx    Esophageal cancer Neg Hx  Inflammatory bowel disease Neg Hx    Liver disease Neg Hx    Pancreatic cancer Neg Hx     Social History   Socioeconomic History   Marital status: Single    Spouse name: Not on file   Number of children: 1   Years of education: Not on file   Highest education level: Bachelor's degree (e.g., BA, AB, BS)  Occupational History   Occupation: maintance    Tobacco Use   Smoking status: Former    Types: Cigarettes   Smokeless tobacco: Never   Tobacco comments:    quit 10 years ago  Vaping Use   Vaping status: Never Used  Substance and Sexual Activity   Alcohol use: No   Drug use: No   Sexual activity: Yes  Other Topics Concern   Not on file  Social History Narrative   Not on file   Social Drivers of Health   Financial Resource Strain: Medium Risk (05/04/2023)   Overall Financial Resource Strain (CARDIA)    Difficulty of Paying Living Expenses: Somewhat hard  Food Insecurity: No Food Insecurity (05/04/2023)   Hunger Vital Sign    Worried About Running Out of Food in the Last Year: Never true    Ran Out of Food in the Last Year: Never true  Transportation Needs: No Transportation Needs (05/04/2023)   PRAPARE - Administrator, Civil Service (Medical): No    Lack of Transportation (Non-Medical): No  Physical Activity: Insufficiently Active (05/04/2023)   Exercise Vital Sign    Days of Exercise per Week: 5 days    Minutes of Exercise per Session: 10 min  Stress: No Stress Concern Present (05/04/2023)   [employer] of Occupational Health - Occupational Stress Questionnaire    Feeling of Stress : Only a little  Social Connections: Moderately Isolated (05/04/2023)   Social Connection and Isolation Panel [NHANES]    Frequency of Communication with Friends and Family: Twice a week    Frequency of Social Gatherings with Friends and Family: Once a week    Attends Religious Services: 1 to 4 times per year    Active Member of [employer] or Organizations: No    Attends Engineer, structural: Not on file    Marital Status: Never married  Intimate Partner Violence: Not on file    Outpatient Medications Prior to Visit  Medication Sig Dispense Refill   acetaminophen (TYLENOL) 650 MG CR tablet Take 1,300 mg by mouth every 8 (eight) hours as needed for pain.     albuterol (VENTOLIN HFA) 108 (90 Base) MCG/ACT inhaler Inhale  2 puffs into the lungs every 4 (four) hours as needed for wheezing or shortness of breath. 18 g 3   Ascorbic Acid (VITAMIN C) 1000 MG tablet Take 1,000 mg by mouth daily.     aspirin EC 81 MG tablet Take 81 mg by mouth daily.     atorvastatin (LIPITOR) 80 MG tablet Take 1 tablet (80 mg total) by mouth daily. 90 tablet 1   cholecalciferol (VITAMIN D) 1000 units tablet Take 1,000 Units by mouth daily.     ezetimibe (ZETIA) 10 MG tablet Take 1 tablet (10 mg total) by mouth daily. 90 tablet 1   fluticasone (FLONASE) 50 MCG/ACT nasal spray Place 2 sprays into both nostrils daily. 16 g 0   gabapentin (NEURONTIN) 300 MG capsule Take 1 capsule (300 mg total) by mouth at bedtime. 90 capsule 0   hydrochlorothiazide (HYDRODIURIL) 25 MG tablet Take 1 tablet (  25 mg total) by mouth daily. 90 tablet 1   levocetirizine (XYZAL) 5 MG tablet TAKE 1 TABLET BY MOUTH ONCE DAILY IN THE EVENING 90 tablet 0   metFORMIN (GLUCOPHAGE) 1000 MG tablet TAKE 1 TABLET BY MOUTH TWICE DAILY WITH A MEAL . APPOINTMENT REQUIRED FOR FUTURE REFILLS 180 tablet 0   montelukast (SINGULAIR) 10 MG tablet Take 1 tablet (10 mg total) by mouth at bedtime. 90 tablet 0   omeprazole (PRILOSEC) 40 MG capsule TAKE 1 CAPSULE BY MOUTH ONCE DAILY 90 capsule 0   oxybutynin (DITROPAN-XL) 10 MG 24 hr tablet Take 1 tablet (10 mg total) by mouth daily. 90 tablet 1   sodium chloride (OCEAN) 0.65 % SOLN nasal spray Place 1 spray into both nostrils as needed.     tamsulosin (FLOMAX) 0.4 MG CAPS capsule Take 1 capsule (0.4 mg total) by mouth daily. 90 capsule 3   amLODipine (NORVASC) 10 MG tablet Take 1 tablet (10 mg total) by mouth daily. 90 tablet 0   glipiZIDE (GLUCOTROL XL) 5 MG 24 hr tablet Take 1 tablet (5 mg total) by mouth daily with breakfast. 60 tablet 0   metoprolol succinate (TOPROL-XL) 50 MG 24 hr tablet Take 1 tablet (50 mg total) by mouth daily. Take with or immediately following a meal. 90 tablet 1   EPINEPHrine 0.3 mg/0.3 mL IJ SOAJ injection  Inject 0.3 mg into the muscle as needed for anaphylaxis. (Patient not taking: Reported on 03/31/2023) 2 each 1   TRULICITY 1.5 MG/0.5ML SOAJ INJECT 1.5MG  INTO THE SKIN ONCE A WEEK (Patient not taking: Reported on 07/07/2023) 4 mL 0   No facility-administered medications prior to visit.    No Known Allergies  ROS Review of Systems  Constitutional:  Negative for appetite change, chills, fatigue and fever.  HENT:  Negative for congestion, postnasal drip, rhinorrhea and sneezing.   Respiratory:  Negative for cough, shortness of breath and wheezing.   Cardiovascular:  Negative for chest pain, palpitations and leg swelling.  Gastrointestinal:  Negative for abdominal pain, constipation, nausea and vomiting.  Genitourinary:  Negative for difficulty urinating, dysuria, flank pain and frequency.  Musculoskeletal:  Negative for arthralgias, back pain, joint swelling and myalgias.  Skin:  Negative for color change, pallor, rash and wound.  Neurological:  Negative for dizziness, facial asymmetry, weakness, numbness and headaches.  Psychiatric/Behavioral:  Negative for behavioral problems, confusion, self-injury and suicidal ideas.       Objective:    Physical Exam Vitals and nursing note reviewed.  Constitutional:      General: He is not in acute distress.    Appearance: Normal appearance. He is obese. He is not ill-appearing, toxic-appearing or diaphoretic.  HENT:     Mouth/Throat:     Mouth: Mucous membranes are moist.     Pharynx: Oropharynx is clear. No oropharyngeal exudate or posterior oropharyngeal erythema.  Eyes:     General: No scleral icterus.       Right eye: No discharge.        Left eye: No discharge.     Extraocular Movements: Extraocular movements intact.     Conjunctiva/sclera: Conjunctivae normal.  Cardiovascular:     Rate and Rhythm: Normal rate and regular rhythm.     Pulses: Normal pulses.     Heart sounds: Normal heart sounds. No murmur heard.    No friction rub.  No gallop.  Pulmonary:     Effort: Pulmonary effort is normal. No respiratory distress.     Breath sounds: Normal  breath sounds. No stridor. No wheezing, rhonchi or rales.  Chest:     Chest wall: No tenderness.  Abdominal:     General: There is no distension.     Palpations: Abdomen is soft.     Tenderness: There is no abdominal tenderness. There is no right CVA tenderness, left CVA tenderness or guarding.  Musculoskeletal:        General: No swelling, tenderness, deformity or signs of injury.     Right lower leg: No edema.     Left lower leg: No edema.  Skin:    General: Skin is warm and dry.     Capillary Refill: Capillary refill takes less than 2 seconds.     Coloration: Skin is not jaundiced or pale.     Findings: No bruising, erythema or lesion.  Neurological:     Mental Status: He is alert and oriented to person, place, and time.     Motor: No weakness.     Coordination: Coordination normal.     Gait: Gait normal.  Psychiatric:        Mood and Affect: Mood normal.        Behavior: Behavior normal.        Thought Content: Thought content normal.        Judgment: Judgment normal.     BP (!) 151/90   Pulse 79   Temp (!) 97.4 F (36.3 C) (Oral)   Wt (!) 331 lb 9.6 oz (150.4 kg)   SpO2 100%   BMI 40.36 kg/m  Wt Readings from Last 3 Encounters:  07/07/23 (!) 331 lb 9.6 oz (150.4 kg)  05/05/23 (!) 334 lb (151.5 kg)  03/31/23 (!) 328 lb (148.8 kg)    Lab Results  Component Value Date   TSH 1.270 03/13/2020   Lab Results  Component Value Date   WBC 7.7 03/18/2021   HGB 14.3 03/18/2021   HCT 42.7 03/18/2021   MCV 85 03/18/2021   PLT 397 03/18/2021   Lab Results  Component Value Date   NA 140 04/21/2023   K 4.0 04/21/2023   CO2 22 04/21/2023   GLUCOSE 212 (H) 04/21/2023   BUN 13 04/21/2023   CREATININE 0.90 04/21/2023   BILITOT 0.4 12/30/2022   ALKPHOS 57 12/30/2022   AST 28 12/30/2022   ALT 51 (H) 12/30/2022   PROT 7.0 12/30/2022   ALBUMIN 4.5  12/30/2022   CALCIUM 9.3 04/21/2023   ANIONGAP 13 06/11/2020   EGFR 101 04/21/2023   Lab Results  Component Value Date   CHOL 165 05/05/2023   Lab Results  Component Value Date   HDL 32 (L) 05/05/2023   Lab Results  Component Value Date   LDLCALC 103 (H) 05/05/2023   Lab Results  Component Value Date   TRIG 173 (H) 05/05/2023   Lab Results  Component Value Date   CHOLHDL 5.2 (H) 05/05/2023   Lab Results  Component Value Date   HGBA1C 9.6 (A) 07/07/2023      Assessment & Plan:   Problem List Items Addressed This Visit       Cardiovascular and Mediastinum   Essential hypertension - Primary   Currently on amlodipine 10 mg daily, hydrochlorothiazide 25 mg daily, metoprolol 50 mg daily.  Increased metoprolol to 100 mg daily, continue amlodipine 10 mg daily, hydrochlorothiazide 25 mg daily  DASH diet and commitment to daily physical activity for a minimum of 30 minutes discussed and encouraged, as a part of hypertension management. The importance of  attaining a healthy weight is also discussed.     07/07/2023    8:23 AM 07/07/2023    8:21 AM 07/07/2023    8:10 AM 05/05/2023    9:35 AM 05/05/2023    9:02 AM 05/05/2023    8:51 AM 03/31/2023    9:30 AM  BP/Weight  Systolic BP 151 153 163 135 148 140 149  Diastolic BP 90 94 86 84 81 81 85  Wt. (Lbs)   331.6   334   BMI   40.36 kg/m2   40.66 kg/m2            Relevant Medications   metoprolol succinate (TOPROL-XL) 100 MG 24 hr tablet   amLODipine (NORVASC) 10 MG tablet     Endocrine   Type 2 diabetes mellitus without complication, without long-term current use of insulin (HCC)   Lab Results  Component Value Date   HGBA1C 9.6 (A) 07/07/2023  Chronic uncontrolled condition Continue metformin 1000 mg twice daily, increase glipizide to 10 mg daily Will refer the patient to the clinical pharmacist for assistance with cost of Trulicity Patient counseled on low-carb diet, encouraged to lose weight CBG goals  discussed Diabetes foot exam completed Follow-up in 4 weeks     - POCT glycosylated hemoglobin (Hb A1C) - glipiZIDE (GLUCOTROL XL) 10 MG 24 hr tablet; Take 1 tablet (10 mg total) by mouth daily with breakfast.  Dispense: 90 tablet; Refill: 1 - AMB Referral [facility] Care Management       Relevant Medications   glipiZIDE (GLUCOTROL XL) 10 MG 24 hr tablet   Other Relevant Orders   POCT glycosylated hemoglobin (Hb A1C) (Completed)   AMB Referral [facility] Care Management     Other   Dyslipidemia, goal LDL below 70   Currently on atorvastatin 80 mg daily, Zetia 10 mg daily LDL goal is less than 70 checking lipid panel Avoid fatty fried foods  Lab Results  Component Value Date   CHOL 165 05/05/2023   HDL 32 (L) 05/05/2023   LDLCALC 103 (H) 05/05/2023   LDLDIRECT 98 05/05/2023   TRIG 173 (H) 05/05/2023   CHOLHDL 5.2 (H) 05/05/2023         Relevant Medications   metoprolol succinate (TOPROL-XL) 100 MG 24 hr tablet   amLODipine (NORVASC) 10 MG tablet   Other Relevant Orders   Lipid panel   Obesity, morbid, BMI 40.0-49.9 (HCC)   Wt Readings from Last 3 Encounters:  07/07/23 (!) 331 lb 9.6 oz (150.4 kg)  05/05/23 (!) 334 lb (151.5 kg)  03/31/23 (!) 328 lb (148.8 kg)   Body mass index is 40.36 kg/m.  Patient counseled on low-carb diet Encouraged engage in regular moderate to vigorous exercise at least 250 minutes weekly as tolerated Was on Trulicity but has stopped taking the medication due to cost I have referred the patient to the clinical pharmacist for medication assistance for Trulicity      Relevant Medications   glipiZIDE (GLUCOTROL XL) 10 MG 24 hr tablet    Meds ordered this encounter  Medications   metoprolol succinate (TOPROL-XL) 100 MG 24 hr tablet    Sig: Take 1 tablet (100 mg total) by mouth daily. Take with or immediately following a meal.    Dispense:  90 tablet    Refill:  3   glipiZIDE (GLUCOTROL XL) 10 MG 24 hr tablet    Sig: Take 1 tablet (10 mg total) by  mouth daily with breakfast.    Dispense:  90 tablet  Refill:  1   amLODipine (NORVASC) 10 MG tablet    Sig: Take 1 tablet (10 mg total) by mouth daily.    Dispense:  90 tablet    Refill:  2    Follow-up: Return in about 4 weeks (around 08/04/2023) for HTN, DM.    Donell Beers, FNP

## 2023-07-08 ENCOUNTER — Other Ambulatory Visit: Payer: Self-pay | Admitting: Nurse Practitioner

## 2023-07-08 DIAGNOSIS — E785 Hyperlipidemia, unspecified: Secondary | ICD-10-CM

## 2023-07-08 LAB — LIPID PANEL
Chol/HDL Ratio: 5.8 ratio — ABNORMAL HIGH (ref 0.0–5.0)
Cholesterol, Total: 167 mg/dL (ref 100–199)
HDL: 29 mg/dL — ABNORMAL LOW (ref >39)
LDL Chol Calc (NIH): 98 mg/dL (ref 0–99)
Triglycerides: 234 mg/dL — ABNORMAL HIGH (ref 0–149)
VLDL Cholesterol Cal: 40 mg/dL (ref 5–40)

## 2023-07-08 MED ORDER — REPATHA SURECLICK 140 MG/ML ~~LOC~~ SOAJ: 140.0000 mg | SUBCUTANEOUS | 2 refills | Status: DC

## 2023-07-10 ENCOUNTER — Other Ambulatory Visit: Payer: Self-pay | Admitting: Nurse Practitioner

## 2023-07-10 ENCOUNTER — Telehealth: Payer: Self-pay | Admitting: *Deleted

## 2023-07-10 DIAGNOSIS — E1169 Type 2 diabetes mellitus with other specified complication: Secondary | ICD-10-CM

## 2023-07-10 DIAGNOSIS — E785 Hyperlipidemia, unspecified: Secondary | ICD-10-CM

## 2023-07-10 MED ORDER — ATORVASTATIN CALCIUM 80 MG PO TABS: 80.0000 mg | ORAL_TABLET | Freq: Every day | ORAL | 1 refills | Status: DC

## 2023-07-10 NOTE — Progress Notes (Signed)
 Care Guide Pharmacy Note  07/10/2023 Name: Scott Buchanan MRN: [id number] DOB: August 05, 1965  Referred By: Donell Beers, FNP Reason for referral: Care Coordination (Initial outreach to schedule referral with PharmD )   Scott Buchanan is a 58 y.o. year old male who is a primary care patient of Donell Beers, FNP.  Scott Buchanan was referred to the pharmacist for assistance related to: DMII medication assistance for trulicity.  Successful contact was made with the patient to discuss pharmacy services including being ready for the pharmacist to call at least 5 minutes before the scheduled appointment time and to have medication bottles and any blood pressure readings ready for review. The patient agreed to meet with the pharmacist via telephone visit on (date/time).4/1 230 PM  Gwenevere Ghazi  [facility], [facility] Guide  Direct Dial: [phone]  Fax [phone]

## 2023-07-17 ENCOUNTER — Other Ambulatory Visit: Payer: Self-pay

## 2023-07-17 ENCOUNTER — Telehealth: Payer: Self-pay | Admitting: Nurse Practitioner

## 2023-07-17 NOTE — Telephone Encounter (Signed)
 Copied from CRM [id number]. Topic: Clinical - Prescription Issue >> Jul 17, 2023  1:24 PM Carlatta H wrote: Reason for CRM: Patient prescription for Evolocumab [facility] SURECLICK) 140 MG/ML SOAJ [[id number]] needs to be sent for prior authorization

## 2023-07-21 ENCOUNTER — Other Ambulatory Visit: Payer: Self-pay

## 2023-07-21 DIAGNOSIS — Z79899 Other long term (current) drug therapy: Secondary | ICD-10-CM

## 2023-07-21 NOTE — Progress Notes (Unsigned)
 07/21/2023 Name: Scott Buchanan MRN: [id number] DOB: Sep 23, 1965  No chief complaint on file.   Scott Buchanan is a 58 y.o. year old male who presented for a telephone visit.   They were referred to the pharmacist by their PCP for assistance in managing diabetes, hypertension, hyperlipidemia, and medication access.    Subjective:  Care Team: Primary Care Provider: Donell Beers, FNP ; Next Scheduled Visit: *** {careteamprovider:27366}  Medication Access/Adherence  Current Pharmacy:  [facility] [address] ([address]), [place] - [address] [place] ([place]) [place] [address] Phone: [phone] Fax: [phone]   Patient reports affordability concerns with their medications: {YES/NO:21197} Patient reports access/transportation concerns to their pharmacy: {YES/NO:21197} Patient reports adherence concerns with their medications:  {YES/NO:21197} ***   Diabetes:  Current medications: Glipizide XL 10 mg daily, metformin 1000 mg twice daily Medications tried in the past:   Current glucose readings:  Fastings: 150-200  Using standard BGM (ACCU Chek) meter; testing 0-1 times daily (possibly every other day)   Patient denies hypoglycemic s/sx including dizziness, shakiness, sweating. Patient reports hyperglycemic symptoms including polydipsia,nocturia.  Current meal patterns:  - enjoys sweets  - using wheat bread for sandwiches for work - cheese and cracker snacks  - trying to reduce pasta  - veggies (green beans, beets; avoids corn)  Current physical activity: No traditional workout routine; 15k-20K steps per day with work    Hypertension:  Current medications: hydrochlorothiazide 25 mg daily, metoprolol XL 50 mg two tablets once daily (TDD 100 mg; doubling 50 mg dose to avoid wasting medication) Medications previously tried:   Patient has a validated, automated, upper arm home BP cuff Current blood pressure readings readings: 142/97  (today)   - checks once weekly; last Saturday 151/91  Patient denies hypotensive s/sx including dizziness, lightheadedness.  Patient denies hypertensive symptoms including headache, chest pain, shortness of breath    Hyperlipidemia/ASCVD Risk Reduction  Current lipid lowering medications: atorvastatin 80 mg daily and Zetia 10 mg daily   - denies missing doses of medications  Medications tried in the past: Repatha (PA required; attempted [employer] grant and patient does not qualify)  Antiplatelet regimen:     The 10-year ASCVD risk score (Arnett DK, et al., 2019) is: 22.3%   Values used to calculate the score:     Age: 47 years     Sex: Male     Is Non-Hispanic African American: No     Diabetic: Yes     Tobacco smoker: No     Systolic Blood Pressure: 151 mmHg     Is BP treated: Yes     HDL Cholesterol: 29 mg/dL     Total Cholesterol: 167 mg/dL    Objective:  Lab Results  Component Value Date   HGBA1C 9.6 (A) 07/07/2023    Lab Results  Component Value Date   CREATININE 0.90 04/21/2023   BUN 13 04/21/2023   NA 140 04/21/2023   K 4.0 04/21/2023   CL 103 04/21/2023   CO2 22 04/21/2023    Lab Results  Component Value Date   CHOL 167 07/07/2023   HDL 29 (L) 07/07/2023   LDLCALC 98 07/07/2023   LDLDIRECT 98 05/05/2023   TRIG 234 (H) 07/07/2023   CHOLHDL 5.8 (H) 07/07/2023    Medications Reviewed Today   Medications were not reviewed in this encounter       Assessment/Plan:   Diabetes: - Currently uncontrolled - Reviewed long term cardiovascular and renal outcomes  of uncontrolled blood sugar - Reviewed goal A1c, goal fasting, and goal 2 hour post prandial glucose - Reviewed dietary modifications including reducing sweets, snacks (protein + carbohydrates - nuts and cheese, cottage cheese, beef jerky)  - Reviewed lifestyle modifications including:  - Recommend to ***  - Patient denies personal or family history of multiple endocrine neoplasia type 2,  medullary thyroid cancer; personal history of pancreatitis or gallbladder disease. - Recommend to check glucose *** - Meets financial criteria for *** patient assistance program through ***. Will collaborate with provider, CPhT, and patient to pursue assistance.      Hypertension: - Currently {CHL Controlled/Uncontrolled:3390832935} - Reviewed long term cardiovascular and renal outcomes of uncontrolled blood pressure - Reviewed appropriate blood pressure monitoring technique and reviewed goal blood pressure. Recommended to check home blood pressure and heart rate *** - Recommend to ***      Hyperlipidemia/ASCVD Risk Reduction: - Currently {CHL Controlled/Uncontrolled:3390832935}.  - Reviewed long term complications of uncontrolled cholesterol - Reviewed dietary recommendations including *** - Reviewed lifestyle recommendations including *** - Recommend to ***  - Meets financial criteria for *** patient assistance program through ***. Will collaborate with provider, CPhT, and patient to pursue assistance.     Medication Management: - Currently strategy {sufficient/insufficient:26424} to maintain appropriate adherence to prescribed medication regimen - Suggested use of weekly pill box to organize medications - Created list of medication, indication, and administration time. Provided to patient - Discussed collaboration with local pharmacies for adherence packaging. Reviewed local pharmacies with adherence packaging options. Patient elects to ***    Follow Up Plan: 2 weeks after PCP appointment   [employer] Insurance via discount government (market Place) that you purchase and patient is on high deductible  Epi Pen new one?

## 2023-07-22 ENCOUNTER — Other Ambulatory Visit: Payer: Self-pay

## 2023-07-23 ENCOUNTER — Other Ambulatory Visit: Payer: Self-pay | Admitting: Nurse Practitioner

## 2023-07-23 ENCOUNTER — Other Ambulatory Visit (HOSPITAL_COMMUNITY): Payer: Self-pay

## 2023-07-23 ENCOUNTER — Other Ambulatory Visit (HOSPITAL_BASED_OUTPATIENT_CLINIC_OR_DEPARTMENT_OTHER): Payer: Self-pay

## 2023-07-23 NOTE — Patient Instructions (Addendum)
 Mr. Ganaway,   Check your blood pressure three time weekly, and any time you have concerning symptoms like headache, chest pain, dizziness, shortness of breath, or vision changes.   Our goal is less than 130/80.  To appropriately check your blood pressure, make sure you do the following:  1) Avoid caffeine, exercise, or tobacco products for 30 minutes before checking. Empty your bladder. 2) Sit with your back supported in a flat-backed chair. Rest your arm on something flat (arm of the chair, table, etc). 3) Sit still with your feet flat on the floor, resting, for at least 5 minutes.  4) Check your blood pressure. Take 1-2 readings.  5) Write down these readings and bring with you to any provider appointments.  Bring your home blood pressure machine with you to a provider's office for accuracy comparison at least once a year.   Make sure you take your blood pressure medications before you come to any office visit, even if you were asked to fast for labs.   Check your blood sugars once daily alternating days to check fasting and 2 hours after eat:  1) Fasting, first thing in the morning before breakfast and  2) 2 hours after your largest meal.   For a goal A1c of less than 7%, goal fasting readings are less than 130 and goal 2 hour after meal readings are less than 180.   Please also make sure you continue to take your medications as prescribed.   Thanks,  Cephus Shelling, PharmD Clinical Pharmacist Cell: [phone]

## 2023-07-29 ENCOUNTER — Other Ambulatory Visit: Payer: Self-pay | Admitting: Nurse Practitioner

## 2023-07-29 DIAGNOSIS — E119 Type 2 diabetes mellitus without complications: Secondary | ICD-10-CM

## 2023-08-12 ENCOUNTER — Ambulatory Visit (INDEPENDENT_AMBULATORY_CARE_PROVIDER_SITE_OTHER): Payer: Self-pay | Admitting: Nurse Practitioner

## 2023-08-12 ENCOUNTER — Encounter: Payer: Self-pay | Admitting: Nurse Practitioner

## 2023-08-12 VITALS — BP 151/85 | HR 72 | Temp 97.8°F | Wt 323.0 lb

## 2023-08-12 DIAGNOSIS — E785 Hyperlipidemia, unspecified: Secondary | ICD-10-CM

## 2023-08-12 DIAGNOSIS — I10 Essential (primary) hypertension: Secondary | ICD-10-CM

## 2023-08-12 DIAGNOSIS — E119 Type 2 diabetes mellitus without complications: Secondary | ICD-10-CM

## 2023-08-12 MED ORDER — METFORMIN HCL 1000 MG PO TABS: 1000.0000 mg | ORAL_TABLET | Freq: Two times a day (BID) | ORAL | 1 refills | Status: DC

## 2023-08-12 MED ORDER — GLIPIZIDE ER 10 MG PO TB24: 10.0000 mg | ORAL_TABLET | Freq: Two times a day (BID) | ORAL | 2 refills | Status: DC

## 2023-08-12 MED ORDER — CARVEDILOL 25 MG PO TABS: 25.0000 mg | ORAL_TABLET | Freq: Two times a day (BID) | ORAL | 3 refills | Status: DC

## 2023-08-12 NOTE — Assessment & Plan Note (Signed)
 Lab Results  Component Value Date   CHOL 167 07/07/2023   HDL 29 (L) 07/07/2023   LDLCALC 98 07/07/2023   LDLDIRECT 98 05/05/2023   TRIG 234 (H) 07/07/2023   CHOLHDL 5.8 (H) 07/07/2023  Continue atorvastatin  80 mg daily, Zetia  10 mg daily Avoid fatty fried foods, lose weight Follow-up with cardiology

## 2023-08-12 NOTE — Progress Notes (Signed)
 Established Patient Office Visit  Subjective:  Patient ID: Scott Buchanan, male    DOB: Oct 28, 1965  Age: 58 y.o. MRN: [id number]  CC:  Chief Complaint  Patient presents with   Hypertension   Diabetes    Follow up    HPI Scott Buchanan is a 58 y.o. male. has a past medical history of Arthritis, Asthma, Diabetes mellitus without complication (HCC), Fatty liver (11/20/2019), Hyperlipidemia associated with type 2 diabetes mellitus (HCC) (05/14/2018), Hypertension, Morbid obesity (HCC) (07/27/2017), Primary localized osteoarthritis of right knee (03/02/2018), Seasonal allergies (11/09/2013), Severe persistent asthma without complication (05/16/2021), and Type 2 diabetes mellitus without complication, without long-term current use of insulin  (HCC) (05/12/2017).  Patient presents for follow-up of his chronic medical conditions  Type 2 diabetes.  Currently on glipizide  10 mg daily, metformin  1000 mg twice daily, due to his high deductible insurance unable to afford cost of a GLP-1 and does not qualify for patient assistance.  The clinical pharmacist recommended increasing glipizide  to 10 mg twice daily but he has been taking medication once daily.  Blood sugar readings at home has been between 1 60-2 100s, stated that he has been making changes to his diet cutting out sweets, and eating out less  Hypertension.  Currently on metoprolol  100 mg daily, amlodipine  10 mg daily, hydrochlorothiazide  25 mg daily.  States that his blood pressure has been consistently greater than 140s over 90s.  No chest pain shortness of breath edema.  The clinical pharmacist had recommended switching metoprolol  to carvedilol .  He is established with cardiology but does not see them until June   Hyperlipidemia.  On atorvastatin  80 mg daily, Zetia  10 mg daily, insurance would like Repatha  to be ordered by a cardiologist, has appointment with cardiology in June     Past Medical History:  Diagnosis Date   Arthritis     Asthma    Diabetes mellitus without complication (HCC)    type 2   Fatty liver 11/20/2019   Hyperlipidemia associated with type 2 diabetes mellitus (HCC) 05/14/2018   Hypertension    Morbid obesity (HCC) 07/27/2017   Primary localized osteoarthritis of right knee 03/02/2018   Seasonal allergies 11/09/2013   Severe persistent asthma without complication 05/16/2021   Type 2 diabetes mellitus without complication, without long-term current use of insulin  (HCC) 05/12/2017    Past Surgical History:  Procedure Laterality Date   CYSTECTOMY     tailbone   ETHMOIDECTOMY Bilateral 04/07/2017   Procedure: BILATERAL ETHMOIDECTOMY AND SPHENOIDECTOMY;  Surgeon: Reynold Caves, MD;  Location: [facility];  Service: ENT;  Laterality: Bilateral;   KNEE ARTHROSCOPY W/ MENISCAL REPAIR     right and left knee one year apart   LEFT HEART CATH AND CORONARY ANGIOGRAPHY N/A 09/04/2020   Procedure: LEFT HEART CATH AND CORONARY ANGIOGRAPHY;  Surgeon: Swaziland, Peter M, MD;  Location: [facility];  Service: Cardiovascular;  Laterality: N/A;   PARTIAL KNEE ARTHROPLASTY Right 03/02/2018   Procedure: UNICOMPARTMENTAL KNEE;  Surgeon: Osa Blase, MD;  Location: [facility];  Service: Orthopedics;  Laterality: Right;  with block   SINUS ENDO WITH FUSION Bilateral 04/07/2017   Procedure: BILATERAL FRONTAL RECESS SINUS EXPLORATION WITH SINUS FUSION NAVIGATION;  Surgeon: Reynold Caves, MD;  Location: [facility];  Service: ENT;  Laterality: Bilateral;   SINUS EXPLORATION     TONSILLECTOMY     TURBINATE REDUCTION Bilateral 04/07/2017   Procedure: BILATERAL TURBINATE REDUCTION;  Surgeon: Reynold Caves, MD;  Location: [facility];  Service: ENT;  Laterality: Bilateral;   unicompartmental right knee      Family History  Problem Relation Age of Onset   Hypertension Mother    Colon cancer Neg Hx    Colon polyps Neg Hx    Stomach cancer Neg Hx    Rectal cancer Neg Hx    Esophageal cancer  Neg Hx    Inflammatory bowel disease Neg Hx    Liver disease Neg Hx    Pancreatic cancer Neg Hx     Social History   Socioeconomic History   Marital status: Single    Spouse name: Not on file   Number of children: 1   Years of education: Not on file   Highest education level: Bachelor's degree (e.g., BA, AB, BS)  Occupational History   Occupation: maintance   Tobacco Use   Smoking status: Former    Types: Cigarettes   Smokeless tobacco: Never   Tobacco comments:    quit 10 years ago  Vaping Use   Vaping status: Never Used  Substance and Sexual Activity   Alcohol use: No   Drug use: No   Sexual activity: Yes  Other Topics Concern   Not on file  Social History Narrative   Not on file   Social Drivers of Health   Financial Resource Strain: Medium Risk (05/04/2023)   Overall Financial Resource Strain (CARDIA)    Difficulty of Paying Living Expenses: Somewhat hard  Food Insecurity: No Food Insecurity (05/04/2023)   Hunger Vital Sign    Worried About Running Out of Food in the Last Year: Never true    Ran Out of Food in the Last Year: Never true  Transportation Needs: No Transportation Needs (05/04/2023)   PRAPARE - Administrator, Civil Service (Medical): No    Lack of Transportation (Non-Medical): No  Physical Activity: Insufficiently Active (05/04/2023)   Exercise Vital Sign    Days of Exercise per Week: 5 days    Minutes of Exercise per Session: 10 min  Stress: No Stress Concern Present (05/04/2023)   [employer] of Occupational Health - Occupational Stress Questionnaire    Feeling of Stress : Only a little  Social Connections: Moderately Isolated (05/04/2023)   Social Connection and Isolation Panel [NHANES]    Frequency of Communication with Friends and Family: Twice a week    Frequency of Social Gatherings with Friends and Family: Once a week    Attends Religious Services: 1 to 4 times per year    Active Member of [employer] or Organizations: No     Attends Engineer, structural: Not on file    Marital Status: Never married  Intimate Partner Violence: Not on file    Outpatient Medications Prior to Visit  Medication Sig Dispense Refill   acetaminophen  (TYLENOL ) 500 MG tablet Take 500 mg by mouth every 6 (six) hours as needed for headache, mild pain (pain score 1-3) or moderate pain (pain score 4-6).     albuterol  (VENTOLIN  HFA) 108 (90 Base) MCG/ACT inhaler Inhale 2 puffs into the lungs every 4 (four) hours as needed for wheezing or shortness of breath. 18 g 3   amLODipine  (NORVASC ) 10 MG tablet Take 1 tablet (10 mg total) by mouth daily. 90 tablet 2   Ascorbic Acid  (VITAMIN C ) 1000 MG tablet Take 2,000 mg by mouth daily.     aspirin  EC 81 MG tablet Take 81 mg by mouth daily.     atorvastatin  (LIPITOR) 80  MG tablet Take 1 tablet (80 mg total) by mouth daily. 90 tablet 1   cholecalciferol  (VITAMIN D ) 1000 units tablet Take 2,000 Units by mouth daily.     EPINEPHrine  0.3 mg/0.3 mL IJ SOAJ injection Inject 0.3 mg into the muscle as needed for anaphylaxis. 2 each 1   ezetimibe  (ZETIA ) 10 MG tablet Take 1 tablet (10 mg total) by mouth daily. 90 tablet 1   fluticasone  (FLONASE ) 50 MCG/ACT nasal spray Use 2 spray(s) in each nostril once daily 16 g 0   gabapentin  (NEURONTIN ) 300 MG capsule Take 1 capsule (300 mg total) by mouth at bedtime. 90 capsule 0   hydrochlorothiazide  (HYDRODIURIL ) 25 MG tablet Take 1 tablet (25 mg total) by mouth daily. 90 tablet 1   levocetirizine (XYZAL ) 5 MG tablet TAKE 1 TABLET BY MOUTH ONCE DAILY IN THE EVENING 90 tablet 0   montelukast  (SINGULAIR ) 10 MG tablet Take 1 tablet (10 mg total) by mouth at bedtime. 90 tablet 0   omeprazole  (PRILOSEC) 40 MG capsule TAKE 1 CAPSULE BY MOUTH ONCE DAILY 90 capsule 0   oxybutynin  (DITROPAN -XL) 10 MG 24 hr tablet Take 1 tablet (10 mg total) by mouth daily. 90 tablet 1   sodium chloride  (OCEAN) 0.65 % SOLN nasal spray Place 1 spray into both nostrils as needed.      tamsulosin  (FLOMAX ) 0.4 MG CAPS capsule Take 1 capsule (0.4 mg total) by mouth daily. 90 capsule 3   glipiZIDE  (GLUCOTROL  XL) 10 MG 24 hr tablet Take 1 tablet (10 mg total) by mouth daily with breakfast. 90 tablet 1   metFORMIN  (GLUCOPHAGE ) 1000 MG tablet TAKE 1 TABLET BY MOUTH TWICE DAILY WITH  A  MEAL 180 tablet 0   metoprolol  succinate (TOPROL -XL) 100 MG 24 hr tablet Take 1 tablet (100 mg total) by mouth daily. Take with or immediately following a meal. 90 tablet 3   Evolocumab  (REPATHA  SURECLICK) 140 MG/ML SOAJ Inject 140 mg into the skin every 14 (fourteen) days. (Patient not taking: Reported on 07/21/2023) 2 mL 2   TRULICITY  1.5 MG/0.5ML SOAJ INJECT 1.5MG  INTO THE SKIN ONCE A WEEK (Patient not taking: Reported on 07/21/2023) 4 mL 0   No facility-administered medications prior to visit.    No Known Allergies  ROS Review of Systems  Constitutional:  Negative for appetite change, chills, fatigue and fever.  HENT:  Negative for congestion, postnasal drip, rhinorrhea and sneezing.   Respiratory:  Negative for cough, shortness of breath and wheezing.   Cardiovascular:  Negative for chest pain, palpitations and leg swelling.  Gastrointestinal:  Negative for abdominal pain, constipation, nausea and vomiting.  Genitourinary:  Negative for difficulty urinating, dysuria, flank pain and frequency.  Musculoskeletal:  Negative for arthralgias, back pain, joint swelling and myalgias.  Skin:  Negative for color change, pallor, rash and wound.  Neurological:  Negative for dizziness, facial asymmetry, weakness, numbness and headaches.  Psychiatric/Behavioral:  Negative for behavioral problems, confusion, self-injury and suicidal ideas.       Objective:    Physical Exam Vitals and nursing note reviewed.  Constitutional:      General: He is not in acute distress.    Appearance: Normal appearance. He is not ill-appearing, toxic-appearing or diaphoretic.  HENT:     Mouth/Throat:     Mouth: Mucous  membranes are moist.     Pharynx: Oropharynx is clear. No oropharyngeal exudate or posterior oropharyngeal erythema.  Eyes:     General: No scleral icterus.  Right eye: No discharge.        Left eye: No discharge.     Extraocular Movements: Extraocular movements intact.     Conjunctiva/sclera: Conjunctivae normal.  Cardiovascular:     Rate and Rhythm: Normal rate and regular rhythm.     Pulses: Normal pulses.     Heart sounds: Normal heart sounds. No murmur heard.    No friction rub. No gallop.  Pulmonary:     Effort: Pulmonary effort is normal. No respiratory distress.     Breath sounds: Normal breath sounds. No stridor. No wheezing, rhonchi or rales.  Chest:     Chest wall: No tenderness.  Abdominal:     General: There is no distension.     Palpations: Abdomen is soft.     Tenderness: There is no abdominal tenderness. There is no right CVA tenderness, left CVA tenderness or guarding.  Musculoskeletal:        General: No swelling, tenderness, deformity or signs of injury.     Right lower leg: No edema.     Left lower leg: No edema.  Skin:    General: Skin is warm and dry.     Capillary Refill: Capillary refill takes less than 2 seconds.     Coloration: Skin is not jaundiced or pale.     Findings: No bruising, erythema or lesion.  Neurological:     Mental Status: He is alert and oriented to person, place, and time.     Motor: No weakness.     Coordination: Coordination normal.     Gait: Gait normal.  Psychiatric:        Mood and Affect: Mood normal.        Behavior: Behavior normal.        Thought Content: Thought content normal.        Judgment: Judgment normal.     BP (!) 151/85   Pulse 72   Temp 97.8 F (36.6 C)   Wt (!) 323 lb (146.5 kg)   SpO2 98%   BMI 39.32 kg/m  Wt Readings from Last 3 Encounters:  08/12/23 (!) 323 lb (146.5 kg)  07/07/23 (!) 331 lb 9.6 oz (150.4 kg)  05/05/23 (!) 334 lb (151.5 kg)    Lab Results  Component Value Date   TSH  1.270 03/13/2020   Lab Results  Component Value Date   WBC 7.7 03/18/2021   HGB 14.3 03/18/2021   HCT 42.7 03/18/2021   MCV 85 03/18/2021   PLT 397 03/18/2021   Lab Results  Component Value Date   NA 140 04/21/2023   K 4.0 04/21/2023   CO2 22 04/21/2023   GLUCOSE 212 (H) 04/21/2023   BUN 13 04/21/2023   CREATININE 0.90 04/21/2023   BILITOT 0.4 12/30/2022   ALKPHOS 57 12/30/2022   AST 28 12/30/2022   ALT 51 (H) 12/30/2022   PROT 7.0 12/30/2022   ALBUMIN 4.5 12/30/2022   CALCIUM  9.3 04/21/2023   ANIONGAP 13 06/11/2020   EGFR 101 04/21/2023   Lab Results  Component Value Date   CHOL 167 07/07/2023   Lab Results  Component Value Date   HDL 29 (L) 07/07/2023   Lab Results  Component Value Date   LDLCALC 98 07/07/2023   Lab Results  Component Value Date   TRIG 234 (H) 07/07/2023   Lab Results  Component Value Date   CHOLHDL 5.8 (H) 07/07/2023   Lab Results  Component Value Date   HGBA1C 9.6 (A) 07/07/2023  Assessment & Plan:   Problem List Items Addressed This Visit       Cardiovascular and Mediastinum   Essential hypertension - Primary   DASH diet and commitment to daily physical activity for a minimum of 30 minutes discussed and encouraged, as a part of hypertension management. The importance of attaining a healthy weight is also discussed. Stop metoprolol , start carvedilol  25 mg twice daily, continue amlodipine  10 mg daily, hydrochlorothiazide  25 mg daily Blood pressure goal is less than 130/80 Follow-up with cardiology    08/12/2023    8:15 AM 08/12/2023    8:09 AM 07/07/2023    8:23 AM 07/07/2023    8:21 AM 07/07/2023    8:10 AM 05/05/2023    9:35 AM 05/05/2023    9:02 AM  BP/Weight  Systolic BP 151 152 151 153 163 135 148  Diastolic BP 85 86 90 94 86 84 81  Wt. (Lbs)  323   331.6    BMI  39.32 kg/m2   40.36 kg/m2             Relevant Medications   carvedilol  (COREG ) 25 MG tablet     Endocrine   Type 2 diabetes mellitus without  complication, without long-term current use of insulin  (HCC)   Lab Results  Component Value Date   HGBA1C 9.6 (A) 07/07/2023  Chronic uncontrolled condition Increase glipizide  to 10 mg twice daily with meals, continue metformin  1000 mg twice daily Patient counseled on low-carb diet, encouraged to lose weight CBG goals discussed Follow-up in 2 months      Relevant Medications   glipiZIDE  (GLUCOTROL  XL) 10 MG 24 hr tablet   metFORMIN  (GLUCOPHAGE ) 1000 MG tablet     Other   Dyslipidemia, goal LDL below 70   Lab Results  Component Value Date   CHOL 167 07/07/2023   HDL 29 (L) 07/07/2023   LDLCALC 98 07/07/2023   LDLDIRECT 98 05/05/2023   TRIG 234 (H) 07/07/2023   CHOLHDL 5.8 (H) 07/07/2023  Continue atorvastatin  80 mg daily, Zetia  10 mg daily Avoid fatty fried foods, lose weight Follow-up with cardiology      Relevant Medications   carvedilol  (COREG ) 25 MG tablet   Obesity, morbid, BMI 40.0-49.9 (HCC)   Wt Readings from Last 3 Encounters:  08/12/23 (!) 323 lb (146.5 kg)  07/07/23 (!) 331 lb 9.6 oz (150.4 kg)  05/05/23 (!) 334 lb (151.5 kg)   Body mass index is 39.32 kg/m.  Patient has lost 8 pounds since last visit Patient counseled on low-carb modified diet Engage in regular moderate to vigorous exercise at least 150 minutes weekly as tolerated      Relevant Medications   glipiZIDE  (GLUCOTROL  XL) 10 MG 24 hr tablet   metFORMIN  (GLUCOPHAGE ) 1000 MG tablet    Meds ordered this encounter  Medications   glipiZIDE  (GLUCOTROL  XL) 10 MG 24 hr tablet    Sig: Take 1 tablet (10 mg total) by mouth 2 (two) times daily with a meal.    Dispense:  180 tablet    Refill:  2   carvedilol  (COREG ) 25 MG tablet    Sig: Take 1 tablet (25 mg total) by mouth 2 (two) times daily with a meal.    Dispense:  60 tablet    Refill:  3   metFORMIN  (GLUCOPHAGE ) 1000 MG tablet    Sig: Take 1 tablet (1,000 mg total) by mouth 2 (two) times daily with a meal.    Dispense:  180 tablet  Refill:   1    Follow-up: Return in about 2 months (around 10/12/2023) for DM, HTN.    Tamee Battin R Gwenith Tschida, FNP

## 2023-08-12 NOTE — Patient Instructions (Signed)
 Goal for fasting blood sugar ranges from 80 to 120 and 2 hours after any meal or at bedtime should be between 130 to 170.    Around 3 times per week, check your blood pressure 2 times per day. once in the morning and once in the evening. The readings should be at least one minute apart. Write down these values and bring them to your next nurse visit/appointment.  When you check your BP, make sure you have been doing something calm/relaxing 5 minutes prior to checking. Both feet should be flat on the floor and you should be sitting. Use your left arm and make sure it is in a relaxed position (on a table), and that the cuff is at the approximate level/height of your heart. Blood pressure goal is less than 130/80      1. Type 2 diabetes mellitus without complication, without long-term current use of insulin  (HCC)  - glipiZIDE  (GLUCOTROL  XL) 10 MG 24 hr tablet; Take 1 tablet (10 mg total) by mouth 2 (two) times daily with a meal.  Dispense: 180 tablet; Refill: 2  2. Essential hypertension (Primary)  - carvedilol  (COREG ) 25 MG tablet; Take 1 tablet (25 mg total) by mouth 2 (two) times daily with a  meal.  Dispense: 60 tablet; Refill: 3     It is important that you exercise regularly at least 30 minutes 5 times a week as tolerated  Think about what you will eat, plan ahead. Choose " clean, green, fresh or frozen" over canned, processed or packaged foods which are more sugary, salty and fatty. 70 to 75% of food eaten should be vegetables and fruit. Three meals at set times with snacks allowed between meals, but they must be fruit or vegetables. Aim to eat over a 12 hour period , example 7 am to 7 pm, and STOP after  your last meal of the day. Drink water ,generally about 64 ounces per day, no other drink is as healthy. Fruit juice is best enjoyed in a healthy way, by EATING the fruit.  Thanks for choosing [facility] we consider it a privelige to serve you.

## 2023-08-12 NOTE — Assessment & Plan Note (Signed)
 Lab Results  Component Value Date   HGBA1C 9.6 (A) 07/07/2023  Chronic uncontrolled condition Increase glipizide  to 10 mg twice daily with meals, continue metformin  1000 mg twice daily Patient counseled on low-carb diet, encouraged to lose weight CBG goals discussed Follow-up in 2 months

## 2023-08-12 NOTE — Assessment & Plan Note (Signed)
 Wt Readings from Last 3 Encounters:  08/12/23 (!) 323 lb (146.5 kg)  07/07/23 (!) 331 lb 9.6 oz (150.4 kg)  05/05/23 (!) 334 lb (151.5 kg)   Body mass index is 39.32 kg/m.  Patient has lost 8 pounds since last visit Patient counseled on low-carb modified diet Engage in regular moderate to vigorous exercise at least 150 minutes weekly as tolerated

## 2023-08-12 NOTE — Assessment & Plan Note (Signed)
 DASH diet and commitment to daily physical activity for a minimum of 30 minutes discussed and encouraged, as a part of hypertension management. The importance of attaining a healthy weight is also discussed. Stop metoprolol , start carvedilol  25 mg twice daily, continue amlodipine  10 mg daily, hydrochlorothiazide  25 mg daily Blood pressure goal is less than 130/80 Follow-up with cardiology    08/12/2023    8:15 AM 08/12/2023    8:09 AM 07/07/2023    8:23 AM 07/07/2023    8:21 AM 07/07/2023    8:10 AM 05/05/2023    9:35 AM 05/05/2023    9:02 AM  BP/Weight  Systolic BP 151 152 151 153 163 135 148  Diastolic BP 85 86 90 94 86 84 81  Wt. (Lbs)  323   331.6    BMI  39.32 kg/m2   40.36 kg/m2

## 2023-08-14 ENCOUNTER — Ambulatory Visit: Payer: 59 | Admitting: Urology

## 2023-08-17 ENCOUNTER — Ambulatory Visit: Payer: Self-pay | Admitting: Urology

## 2023-08-29 ENCOUNTER — Other Ambulatory Visit: Payer: Self-pay | Admitting: Nurse Practitioner

## 2023-08-29 DIAGNOSIS — J3089 Other allergic rhinitis: Secondary | ICD-10-CM

## 2023-08-31 ENCOUNTER — Other Ambulatory Visit: Payer: Self-pay

## 2023-08-31 MED ORDER — FLUTICASONE PROPIONATE 50 MCG/ACT NA SUSP: 2.0000 | Freq: Every day | NASAL | 0 refills | Status: DC

## 2023-09-06 ENCOUNTER — Other Ambulatory Visit: Payer: Self-pay | Admitting: Nurse Practitioner

## 2023-09-06 DIAGNOSIS — K219 Gastro-esophageal reflux disease without esophagitis: Secondary | ICD-10-CM

## 2023-09-17 ENCOUNTER — Ambulatory Visit: Payer: Self-pay | Admitting: Urology

## 2023-09-18 ENCOUNTER — Other Ambulatory Visit: Payer: Self-pay | Admitting: Nurse Practitioner

## 2023-09-18 DIAGNOSIS — G63 Polyneuropathy in diseases classified elsewhere: Secondary | ICD-10-CM

## 2023-09-21 ENCOUNTER — Other Ambulatory Visit: Payer: Self-pay | Admitting: Nurse Practitioner

## 2023-09-21 DIAGNOSIS — Z9109 Other allergy status, other than to drugs and biological substances: Secondary | ICD-10-CM

## 2023-09-29 ENCOUNTER — Other Ambulatory Visit: Payer: Self-pay

## 2023-09-30 ENCOUNTER — Other Ambulatory Visit (HOSPITAL_COMMUNITY): Payer: Self-pay

## 2023-09-30 ENCOUNTER — Other Ambulatory Visit (INDEPENDENT_AMBULATORY_CARE_PROVIDER_SITE_OTHER): Payer: Self-pay

## 2023-09-30 DIAGNOSIS — E119 Type 2 diabetes mellitus without complications: Secondary | ICD-10-CM

## 2023-09-30 DIAGNOSIS — I10 Essential (primary) hypertension: Secondary | ICD-10-CM

## 2023-09-30 DIAGNOSIS — E785 Hyperlipidemia, unspecified: Secondary | ICD-10-CM

## 2023-09-30 NOTE — Progress Notes (Signed)
 09/30/2023 Name: Scott Buchanan MRN: [id number] DOB: 01-26-1966  Chief Complaint  Patient presents with   Diabetes   Hypertension   Hyperlipidemia    Scott Buchanan is a 58 y.o. year old male who presented for a telephone visit.   They were referred to the pharmacist by their PCP for assistance in managing diabetes and hypertension. PMH includes HTN, angina, asthma, MASL, T2DM, HLD,   Subjective: Patient was last seen by PCP, Scott Lu, NP on 08/12/23. BP was 151/85 mmHg, HR 72. He reported that he has been taking glipizide  10 mg daily and metformin  as prescribed. He has not been able to access high-cost drugs due to high deductible plan. He was instructed to increase glipizide  to 10 mg twice daily. He was instructed to stop metoprolol  and start carvedilol  25 mg BID for BP control.   Today, patient reports doing well. He reports making both of the changes recommended at his last PCP appt. However, he did recently run out of glipizide  due to confusion at the pharmacy.   Care Team: Primary Care Provider: [name], [name], FNP ; Next Scheduled Visit: 10/12/23  Medication Access/Adherence  Current Pharmacy:  [facility] [address] ([address]), [place] - [address] [place] ([place]) [place] [address] Phone: [phone] Fax: [phone]   Patient reports affordability concerns with their medications: Yes  - high deductible insurance plan Patient reports access/transportation concerns to their pharmacy: No  Patient reports adherence concerns with their medications:  No    Diabetes:  Current medications: metformin  1000 mg BID, glipizide  10 mg twice daily with meals (reports that he has been out since last week - because he states that [facility] would not refill until 6/10. He plans to follow-up today)  Medications tried in the past: unable to afford brand name medications with high-deductible plan  Current glucose readings:  Using glucometer - states that he  purchases them OTC, likely Relion; testing 1 time daily - reports average fasting is ~125 mg/dL, reports recently BG have usually been  130 mg/dL since starting glipizide  twice daily.   Patient denies hypoglycemic s/sx including dizziness, shakiness, sweating. Patient denies hyperglycemic symptoms including polyuria, polyphagia, nocturia, neuropathy, blurred vision. Patient endorses occasional polydipsia with dietary excursions.   Current meal patterns: 3 meals a day. Reports that he has increased vegetables. Attempting to stay away from white bread, replaced with wheat bread. Reports that he has switched to zero sugar sodas. Drinks 3-4 bottles of water /day. Reports that sometimes he drinks crystal light.  - Snacks: blackberries, raspberries, a few grapes - usually no protein with this. Has kind breakfast bars. Greek yogurt - Chobani.  - Drinks: Reports that he has switched to zero sugar sodas. Drinks 3-4 bottles of water /day. Reports that sometimes he drinks crystal light.   Current physical activity: did not discuss today  Current medication access support: none  Hypertension:  Current medications: carvedilol  25 mg BID, amlodipine  10 mg daily, hydrochlorothiazide  25 mg daily  Medications previously tried:   Add ARB - fell off bc he did not schedule f/u appt? - no hx of elevated K or SCr  Patient has an automated, upper arm home BP cuff. - reports that he checked last week.  Current blood pressure readings readings: Recalls 145/80s mmHg.   Patient denies hypotensive s/sx including dizziness, lightheadedness.  Patient denies hypertensive symptoms including headache, chest pain, shortness of breath  Hyperlipidemia/ASCVD Risk Reduction  Current lipid lowering medications: atorvastatin  80 mg daily,  ezetimibe  10 mg daily Medications tried in the past:   Antiplatelet regimen:   ASCVD History: LHC in May 2022 without evidence of CAD. Lexiscan  in 2022 with findings c/w ischemia and prior MI  and LVEF mildly decreased to 45-54%.  Family History:  Risk Factors: clinical ASCVD (premature ASCVD), T2DM, HTN, hx of tobacco use  Clinical ASCVD: Yes  The 10-year ASCVD risk score (Arnett DK, et al., 2019) is: 22.3%   Values used to calculate the score:     Age: 81 years     Sex: Male     Is Non-Hispanic African American: No     Diabetic: Yes     Tobacco smoker: No     Systolic Blood Pressure: 151 mmHg     Is BP treated: Yes     HDL Cholesterol: 29 mg/dL     Total Cholesterol: 167 mg/dL    Objective:  BP Readings from Last 3 Encounters:  08/12/23 (!) 151/85  07/07/23 (!) 151/90  05/05/23 135/84    Lab Results  Component Value Date   HGBA1C 9.6 (A) 07/07/2023   UACR 03/31/23: 143 mg/g  Lab Results  Component Value Date   CREATININE 0.90 04/21/2023   BUN 13 04/21/2023   NA 140 04/21/2023   K 4.0 04/21/2023   CL 103 04/21/2023   CO2 22 04/21/2023    Lab Results  Component Value Date   CHOL 167 07/07/2023   HDL 29 (L) 07/07/2023   LDLCALC 98 07/07/2023   LDLDIRECT 98 05/05/2023   TRIG 234 (H) 07/07/2023   CHOLHDL 5.8 (H) 07/07/2023    Medications Reviewed Today     Reviewed by Adra Alanis, RPH (Pharmacist) on 09/30/23 at 1214  Med List Status: <None>   Medication Order Taking? Sig Documenting Provider Last Dose Status Informant  acetaminophen  (TYLENOL ) 500 MG tablet [id number]  Take 500 mg by mouth every 6 (six) hours as needed for headache, mild pain (pain score 1-3) or moderate pain (pain score 4-6). [provider]  Active   albuterol  (VENTOLIN  HFA) 108 (90 Base) MCG/ACT inhaler [id number]  Inhale 2 puffs into the lungs every 4 (four) hours as needed for wheezing or shortness of breath. Sigurd Driver, FNP  Active Self  amLODipine  (NORVASC ) 10 MG tablet [id number] Yes Take 1 tablet (10 mg total) by mouth daily. [name], [name], FNP Taking Active   Ascorbic Acid  (VITAMIN C ) 1000 MG tablet [id number]  Take 2,000 mg by mouth daily. [provider]  Active Self  aspirin  EC 81 MG tablet [id number]  Take 81 mg by mouth daily. [provider]  Active Self  atorvastatin  (LIPITOR) 80 MG tablet [id number] Yes Take 1 tablet (80 mg total) by mouth daily. [name], [name], FNP Taking Active            Med Note Vallarie Gauze, [facility] Buchanan   Tue Jul 21, 2023  2:50 PM) Confirmed patient is taking 80 mg dose   carvedilol  (COREG ) 25 MG tablet [id number] Yes Take 1 tablet (25 mg total) by mouth 2 (two) times daily with a meal. [name], [name], FNP Taking Active   cholecalciferol  (VITAMIN D ) 1000 units tablet [id number]  Take 2,000 Units by mouth daily. [provider]  Active Self  EPINEPHrine  0.3 mg/0.3 mL IJ SOAJ injection [id number]  Inject 0.3 mg into the muscle as needed for anaphylaxis. Trudy Fusi, DO  Active            Med Note Vallarie Gauze, [facility]  Buchanan   Tue Jul 21, 2023  2:49 PM) Expired 11/2022 per patient by looking at the EpiPen   ezetimibe  (ZETIA ) 10 MG tablet [id number] Yes Take 1 tablet (10 mg total) by mouth daily. [name], [name], FNP Taking Active   fluticasone  (FLONASE ) 50 MCG/ACT nasal spray [id number]  Place 2 sprays into both nostrils daily. [name], [name], FNP  Active   gabapentin  (NEURONTIN ) 300 MG capsule [id number]  Take 1 capsule by mouth at bedtime [name], [name], FNP  Active   glipiZIDE  (GLUCOTROL  XL) 10 MG 24 hr tablet [id number] No Take 1 tablet (10 mg total) by mouth 2 (two) times daily with a meal.  Patient not taking: Reported on 09/30/2023   [name], [name], FNP Not Taking Active            Med Note Adra Alanis   Wed Sep 30, 2023 11:06 AM) Has been out since last week  hydrochlorothiazide  (HYDRODIURIL ) 25 MG tablet [id number] Yes Take 1 tablet (25 mg total) by mouth daily. [name], [name], FNP Taking Active   levocetirizine (XYZAL ) 5 MG tablet [id number]  TAKE 1 TABLET BY MOUTH ONCE DAILY IN THE EVENING [name], [name], FNP  Active   metFORMIN  (GLUCOPHAGE ) 1000 MG tablet [id number] Yes  Take 1 tablet (1,000 mg total) by mouth 2 (two) times daily with a meal. [name], [name], FNP Taking Active   montelukast  (SINGULAIR ) 10 MG tablet [id number]  TAKE 1 TABLET BY MOUTH AT BEDTIME [name], [name], FNP  Active   omeprazole  (PRILOSEC) 40 MG capsule [id number]  Take 1 capsule by mouth once daily [name], [name], FNP  Active   oxybutynin  (DITROPAN -XL) 10 MG 24 hr tablet [id number]  Take 1 tablet (10 mg total) by mouth daily. [name], [name], FNP  Active   sodium chloride  (OCEAN) 0.65 % SOLN nasal spray [id number]  Place 1 spray into both nostrils as needed. [name], [name], FNP  Active            Med Note Vallarie Gauze, [facility] Buchanan   Tue Jul 21, 2023  2:55 PM) Taking PRN daily  tamsulosin  (FLOMAX ) 0.4 MG CAPS capsule [id number]  Take 1 capsule (0.4 mg total) by mouth daily. [name], [name], FNP  Active               Assessment/Plan:   Diabetes: - Currently uncontrolled with last A1C of 9.6% above goal < 7%. Patient reports average SMBG fasting is 125 mg/dL, and reports that since taking glipizide  twice daily, AM sugar is usually < 130 mg/dL. He is not monitoring post-prandial. He is not having s/sx of hypoglycemia, but is able to verbalize management plan. He is not able to access brand name medications due to high-deductible plan. He is not a canddiate for pioglitazone due to hx of HFrEF. Therefore, if next A1C remains uncontrolled - likely need to consider addition of basal insulin  (which would likely be $35 mo with his insurance). Patient has PCP appt next week to monitor.  - Reviewed long term cardiovascular and renal outcomes of uncontrolled blood sugar - Reviewed goal A1c, goal fasting, and goal 2 hour post prandial glucose - Reviewed dietary modifications including reducing intake of carbohydrate-heavy foods and increasing intake of protein and non-starchy vegetables. Reviewed reading nutrition labels today.  - Reviewed lifestyle modifications including: Increasing  water  intake to 5-6 bottles daily - Recommend to continue metformin  1000 mg BID, glipizide  10 mg twice daily with meals   - Recommend to check  glucose once daily fasting and occasionally 2-hr PPG - Next A1C at PCP appt on 10/12/23   Hypertension: - Currently uncontrolled with last clinic BP above goal < 130/80 mmHg, which is consistent with home monitoring as well. Patient did switch from metoprolol  to carvedilol  and is tolerating well. Appears he did previously take valsartan  (in 2023). No AE listed as reason for discontinuation, and patient does not recall issue. No hx of elevated K or Scr. Would recommend addition of valsartan  if BP remains uncontrolled at PCP appt next week. Offered to initiate today, but patient declined, since he is coming in for an appt next week This is also appropriate for his elevated UACR to 143 mg/g.  - Reviewed long term cardiovascular and renal outcomes of uncontrolled blood pressure - Reviewed appropriate blood pressure monitoring technique and reviewed goal blood pressure. Recommended to check home blood pressure and heart rate once daily. Instructed to bring log to next PCP appt.  - Recommend to continue carvedilol  25 mg BID, amlodipine  10 mg daily, hydrochlorothiazide  25 mg daily  - Recommend to start valsartan  80 mg daily at next PCP appt if BP remains elevated. May consider starting at lower dose (40 mg daily) for microalbuminuria, if BP is controlled.     Hyperlipidemia/ASCVD Risk Reduction: - Currently uncontrolled with last LDL-C of 98 mg/dL above goal < 55 mg/dL given premature ASCVD (findings of ischemia on Lexiscan  in 2022). Patient has not had lipid panel checked since starting high intensity statin + ezetimibe . Will recheck at upcoming PCP appt. Anticipate Repatha  will not be accessible with high-deductible insurance plan.  - Reviewed long term complications of uncontrolled cholesterol - Recommend to continue atorvastatin  80 mg daily, ezetimibe  10 mg daily   - Recheck lipid panel at PCP appt on 10/12/23. Patient instructed to fast.   Follow Up Plan: PCP 10/12/23, PharmD telephone 11/16/23  Arthea Larsson, PharmD PGY1 Pharmacy Resident

## 2023-10-02 NOTE — Progress Notes (Unsigned)
 Cardiology Office Note:    Date:  10/07/2023   ID:  DOYLE KUNATH, DOB 1965-06-04, MRN [id number]  PCP:  [name], [name], FNP   [facility] Providers Cardiologist:  Mayre Bury [place], MD     Referring MD: [name], [name], FNP   Chief Complaint  Patient presents with   Hypertension   Hyperlipidemia    History of Present Illness:    Scott Buchanan is a 58 y.o. male with a hx of HTN, HLD, DM 2 and history of asthma.  last seen in June 2022. Patient presented to the ED in February 2020 with complaint of fatigue.  Blood pressure was uncontrolled, cardiology service was consulted.     Echocardiogram obtained on 07/03/2020 showed EF 60 to 65%, no significant valve disease, mild dilatation of the aortic root measuring 39 mm.  Myoview  was done for evaluation of chest pain and came back abnormal with EF 51%, medium fixed defect of mild severity present in the inferior wall from base to apex suggestive of prior infarct, medium partially reversible defect of moderate severity present in the basal anteroseptal, mild anteroseptal and apical septal location consistent with previous infarct with peri-infarct ischemia, overall intermediate risk study.   Patient eventually underwent cardiac catheterization that showed normal coronary arteries on 09/04/2020.   On follow up today he reports feeling well. No chest pain, dyspnea, edema or palpitations. He is working 12 hour days at [employer]. Notes BP is typically in 140s systolic. Last A1c 9.6% but reports more recent sugars improved. He is active.  Past Medical History:  Diagnosis Date   Arthritis    Asthma    Diabetes mellitus without complication (HCC)    type 2   Fatty liver 11/20/2019   Hyperlipidemia associated with type 2 diabetes mellitus (HCC) 05/14/2018   Hypertension    Morbid obesity (HCC) 07/27/2017   Primary localized osteoarthritis of right knee 03/02/2018   Seasonal allergies 11/09/2013   Severe persistent asthma without  complication 05/16/2021   Type 2 diabetes mellitus without complication, without long-term current use of insulin  (HCC) 05/12/2017    Past Surgical History:  Procedure Laterality Date   CYSTECTOMY     tailbone   ETHMOIDECTOMY Bilateral 04/07/2017   Procedure: BILATERAL ETHMOIDECTOMY AND SPHENOIDECTOMY;  Surgeon: Reynold Caves, MD;  Location: [facility];  Service: ENT;  Laterality: Bilateral;   KNEE ARTHROSCOPY W/ MENISCAL REPAIR     right and left knee one year apart   LEFT HEART CATH AND CORONARY ANGIOGRAPHY N/A 09/04/2020   Procedure: LEFT HEART CATH AND CORONARY ANGIOGRAPHY;  Surgeon: [place], Abdulraheem Pineo M, MD;  Location: [facility];  Service: Cardiovascular;  Laterality: N/A;   PARTIAL KNEE ARTHROPLASTY Right 03/02/2018   Procedure: UNICOMPARTMENTAL KNEE;  Surgeon: Osa Blase, MD;  Location: [facility];  Service: Orthopedics;  Laterality: Right;  with block   SINUS ENDO WITH FUSION Bilateral 04/07/2017   Procedure: BILATERAL FRONTAL RECESS SINUS EXPLORATION WITH SINUS FUSION NAVIGATION;  Surgeon: Reynold Caves, MD;  Location: [facility];  Service: ENT;  Laterality: Bilateral;   SINUS EXPLORATION     TONSILLECTOMY     TURBINATE REDUCTION Bilateral 04/07/2017   Procedure: BILATERAL TURBINATE REDUCTION;  Surgeon: Reynold Caves, MD;  Location: [facility];  Service: ENT;  Laterality: Bilateral;   unicompartmental right knee      Current Medications: Current Meds  Medication Sig   acetaminophen  (TYLENOL ) 500 MG tablet Take 500 mg by mouth every 6 (six)  hours as needed for headache, mild pain (pain score 1-3) or moderate pain (pain score 4-6).   albuterol  (VENTOLIN  HFA) 108 (90 Base) MCG/ACT inhaler Inhale 2 puffs into the lungs every 4 (four) hours as needed for wheezing or shortness of breath.   amLODipine  (NORVASC ) 10 MG tablet Take 1 tablet (10 mg total) by mouth daily.   Ascorbic Acid  (VITAMIN C ) 1000 MG tablet Take 2,000 mg by mouth daily.   aspirin   EC 81 MG tablet Take 81 mg by mouth daily.   atorvastatin  (LIPITOR) 80 MG tablet Take 1 tablet (80 mg total) by mouth daily.   carvedilol  (COREG ) 25 MG tablet Take 1 tablet (25 mg total) by mouth 2 (two) times daily with a meal.   cholecalciferol  (VITAMIN D ) 1000 units tablet Take 2,000 Units by mouth daily.   EPINEPHrine  0.3 mg/0.3 mL IJ SOAJ injection Inject 0.3 mg into the muscle as needed for anaphylaxis.   ezetimibe  (ZETIA ) 10 MG tablet Take 1 tablet (10 mg total) by mouth daily.   fluticasone  (FLONASE ) 50 MCG/ACT nasal spray Place 2 sprays into both nostrils daily.   gabapentin  (NEURONTIN ) 300 MG capsule Take 1 capsule by mouth at bedtime   glipiZIDE  (GLUCOTROL  XL) 10 MG 24 hr tablet Take 1 tablet (10 mg total) by mouth 2 (two) times daily with a meal.   hydrochlorothiazide  (HYDRODIURIL ) 25 MG tablet Take 1 tablet (25 mg total) by mouth daily.   levocetirizine (XYZAL ) 5 MG tablet TAKE 1 TABLET BY MOUTH ONCE DAILY IN THE EVENING   metFORMIN  (GLUCOPHAGE ) 1000 MG tablet Take 1 tablet (1,000 mg total) by mouth 2 (two) times daily with a meal.   montelukast  (SINGULAIR ) 10 MG tablet TAKE 1 TABLET BY MOUTH AT BEDTIME   olmesartan (BENICAR) 20 MG tablet Take 1 tablet (20 mg total) by mouth daily.   omeprazole  (PRILOSEC) 40 MG capsule Take 1 capsule by mouth once daily   oxybutynin  (DITROPAN -XL) 10 MG 24 hr tablet Take 1 tablet (10 mg total) by mouth daily.   sodium chloride  (OCEAN) 0.65 % SOLN nasal spray Place 1 spray into both nostrils as needed.   tamsulosin  (FLOMAX ) 0.4 MG CAPS capsule Take 1 capsule (0.4 mg total) by mouth daily.     Allergies:   Patient has no known allergies.   Social History   Socioeconomic History   Marital status: Single    Spouse name: Not on file   Number of children: 1   Years of education: Not on file   Highest education level: Bachelor's degree (e.g., BA, AB, BS)  Occupational History   Occupation: maintance   Tobacco Use   Smoking status: Former     Types: Cigarettes   Smokeless tobacco: Never   Tobacco comments:    quit 10 years ago  Vaping Use   Vaping status: Never Used  Substance and Sexual Activity   Alcohol use: No   Drug use: No   Sexual activity: Yes  Other Topics Concern   Not on file  Social History Narrative   Not on file   Social Drivers of Health   Financial Resource Strain: Medium Risk (05/04/2023)   Overall Financial Resource Strain (CARDIA)    Difficulty of Paying Living Expenses: Somewhat hard  Food Insecurity: No Food Insecurity (05/04/2023)   Hunger Vital Sign    Worried About Running Out of Food in the Last Year: Never true    Ran Out of Food in the Last Year: Never true  Transportation Needs: No  Transportation Needs (05/04/2023)   PRAPARE - Administrator, Civil Service (Medical): No    Lack of Transportation (Non-Medical): No  Physical Activity: Insufficiently Active (05/04/2023)   Exercise Vital Sign    Days of Exercise per Week: 5 days    Minutes of Exercise per Session: 10 min  Stress: No Stress Concern Present (05/04/2023)   [employer] of Occupational Health - Occupational Stress Questionnaire    Feeling of Stress : Only a little  Social Connections: Moderately Isolated (05/04/2023)   Social Connection and Isolation Panel    Frequency of Communication with Friends and Family: Twice a week    Frequency of Social Gatherings with Friends and Family: Once a week    Attends Religious Services: 1 to 4 times per year    Active Member of [employer] or Organizations: No    Attends Engineer, structural: Not on file    Marital Status: Never married     Family History: The patient's family history includes Hypertension in his mother. There is no history of Colon cancer, Colon polyps, Stomach cancer, Rectal cancer, Esophageal cancer, Inflammatory bowel disease, Liver disease, or Pancreatic cancer.  ROS:   Please see the history of present illness.     All other systems reviewed and  are negative.  EKGs/Labs/Other Studies Reviewed:    The following studies were reviewed today:  Echo 07/03/20: IMPRESSIONS     1. Left ventricular ejection fraction, by estimation, is 60 to 65%. The  left ventricle has normal function. The left ventricle has no regional  wall motion abnormalities. There is mild concentric left ventricular  hypertrophy. Left ventricular diastolic  parameters are consistent with Grade II diastolic dysfunction  (pseudonormalization).   2. Right ventricular systolic function is normal. The right ventricular  size is normal. There is normal pulmonary artery systolic pressure.   3. Left atrial size was moderately dilated.   4. The mitral valve is normal in structure. No evidence of mitral valve  regurgitation. No evidence of mitral stenosis.   5. The aortic valve is tricuspid. Aortic valve regurgitation is not  visualized. No aortic stenosis is present.   6. Aortic dilatation noted. There is mild dilatation of the aortic root,  measuring 39 mm.   7. The inferior vena cava is normal in size with greater than 50%  respiratory variability, suggesting right atrial pressure of 3 mmHg.    Cath 09/04/2020 LV end diastolic pressure is mildly elevated.   1. Normal coronary anatomy 2. Mildly elevated LVEDP   Plan: consider other causes of chest pain. Risk factor modification.  EKG Interpretation Date/Time:  Wednesday October 07 2023 10:34:01 EDT Ventricular Rate:  80 PR Interval:  186 QRS Duration:  108 QT Interval:  380 QTC Calculation: 438 R Axis:   -17  Text Interpretation: Normal sinus rhythm Moderate voltage criteria for LVH, may be normal variant ( R in aVL , Cornell product ) Repolarization abnormality secondary to ventricular hypertrophy When compared with ECG of 11-Jun-2020 08:13, No significant change was found Confirmed by [place], Sue Mcalexander [phone]) on 10/07/2023 10:40:19 AM    Recent Labs: 12/30/2022: ALT 51 04/21/2023: BUN 13; Creatinine, Ser 0.90;  Potassium 4.0; Sodium 140  Recent Lipid Panel    Component Value Date/Time   CHOL 167 07/07/2023 0900   TRIG 234 (H) 07/07/2023 0900   HDL 29 (L) 07/07/2023 0900   CHOLHDL 5.8 (H) 07/07/2023 0900   LDLCALC 98 07/07/2023 0900   LDLDIRECT 98 05/05/2023 1004  Risk Assessment/Calculations:           Physical Exam:    VS:  BP (!) 142/82 (BP Location: Left Arm, Patient Position: Sitting, Cuff Size: Large)   Pulse 80   Ht 6' 4 (1.93 m)   Wt (!) 324 lb 9.6 oz (147.2 kg)   SpO2 98%   BMI 39.51 kg/m     Wt Readings from Last 3 Encounters:  10/07/23 (!) 324 lb 9.6 oz (147.2 kg)  08/12/23 (!) 323 lb (146.5 kg)  07/07/23 (!) 331 lb 9.6 oz (150.4 kg)     GEN:  Well nourished, well developed in no acute distress HEENT: Normal NECK: No JVD; No carotid bruits LYMPHATICS: No lymphadenopathy CARDIAC: RRR, no murmurs, rubs, gallops RESPIRATORY:  Clear to auscultation without rales, wheezing or rhonchi  ABDOMEN: Soft, non-tender, non-distended MUSCULOSKELETAL:  No edema; No deformity  SKIN: Warm and dry NEUROLOGIC:  Alert and oriented x 3 PSYCHIATRIC:  Normal affect   ASSESSMENT:    1. Essential hypertension   2. Angina pectoris (HCC)     PLAN:    In order of problems listed above:  Atypical chest pain: cardiac catheterization in 2022 showed normal coronary arteries.  Chest pain resolved  Hypertension: Goal BP < 130/80. Recommend adding ARB. Will start olmesartan 20 mg daily. This should help protect kidneys with DM   Hyperlipidemia: On high dose Lipitor and Zetia .  DM2: Managed by primary care provider. Consider GLP 1 agonist. Patient reports this was too costly in the past but he has changed insurance so may be worth relooking at this.        Medication Adjustments/Labs and Tests Ordered: Current medicines are reviewed at length with the patient today.  Concerns regarding medicines are outlined above.  Orders Placed This Encounter  Procedures   EKG 12-Lead     Meds ordered this encounter  Medications   olmesartan (BENICAR) 20 MG tablet    Sig: Take 1 tablet (20 mg total) by mouth daily.    Dispense:  90 tablet    Refill:  3    Follow up in one year  There are no Patient Instructions on file for this visit.   Signed, Eric Nees [place], MD  10/07/2023 10:54 AM    [facility]

## 2023-10-07 ENCOUNTER — Ambulatory Visit: Payer: Self-pay | Attending: Cardiology | Admitting: Cardiology

## 2023-10-07 ENCOUNTER — Encounter: Payer: Self-pay | Admitting: Cardiology

## 2023-10-07 VITALS — BP 142/82 | HR 80 | Ht 76.0 in | Wt 324.6 lb

## 2023-10-07 DIAGNOSIS — E119 Type 2 diabetes mellitus without complications: Secondary | ICD-10-CM | POA: Diagnosis not present

## 2023-10-07 DIAGNOSIS — I10 Essential (primary) hypertension: Secondary | ICD-10-CM

## 2023-10-07 DIAGNOSIS — E1169 Type 2 diabetes mellitus with other specified complication: Secondary | ICD-10-CM

## 2023-10-07 DIAGNOSIS — E785 Hyperlipidemia, unspecified: Secondary | ICD-10-CM

## 2023-10-07 MED ORDER — OLMESARTAN MEDOXOMIL 20 MG PO TABS: 20.0000 mg | ORAL_TABLET | Freq: Every day | ORAL | 3 refills | Status: DC

## 2023-10-07 NOTE — Patient Instructions (Addendum)
 Medication Instructions:  Start Olmesartan 20 mg daily Continue all other medications *If you need a refill on your cardiac medications before your next appointment, please call your pharmacy*  Lab Work: None ordered  Testing/Procedures: None ordered  Follow-Up: At [facility], you and your health needs are our priority.  As part of our continuing mission to provide you with exceptional heart care, our providers are all part of one team.  This team includes your primary Cardiologist (physician) and Advanced Practice Providers or APPs (Physician Assistants and Nurse Practitioners) who all work together to provide you with the care you need, when you need it.  Your next appointment:  1 year   Call in March to schedule June appointment     Provider:  Dr.Jordan   We recommend signing up for the patient portal called MyChart.  Sign up information is provided on this After Visit Summary.  MyChart is used to connect with patients for Virtual Visits (Telemedicine).  Patients are able to view lab/test results, encounter notes, upcoming appointments, etc.  Non-urgent messages can be sent to your provider as well.   To learn more about what you can do with MyChart, go to [email or web address].

## 2023-10-12 ENCOUNTER — Ambulatory Visit: Payer: Self-pay | Admitting: Nurse Practitioner

## 2023-10-12 ENCOUNTER — Encounter: Payer: Self-pay | Admitting: Nurse Practitioner

## 2023-10-12 ENCOUNTER — Other Ambulatory Visit: Payer: Self-pay | Admitting: Nurse Practitioner

## 2023-10-12 ENCOUNTER — Other Ambulatory Visit: Payer: Self-pay

## 2023-10-12 VITALS — BP 133/75 | HR 74 | Temp 97.2°F | Wt 324.0 lb

## 2023-10-12 DIAGNOSIS — E119 Type 2 diabetes mellitus without complications: Secondary | ICD-10-CM

## 2023-10-12 DIAGNOSIS — H9202 Otalgia, left ear: Secondary | ICD-10-CM | POA: Insufficient documentation

## 2023-10-12 DIAGNOSIS — I10 Essential (primary) hypertension: Secondary | ICD-10-CM | POA: Diagnosis not present

## 2023-10-12 DIAGNOSIS — E785 Hyperlipidemia, unspecified: Secondary | ICD-10-CM

## 2023-10-12 DIAGNOSIS — Z23 Encounter for immunization: Secondary | ICD-10-CM | POA: Insufficient documentation

## 2023-10-12 DIAGNOSIS — E781 Pure hyperglyceridemia: Secondary | ICD-10-CM | POA: Insufficient documentation

## 2023-10-12 LAB — POCT GLYCOSYLATED HEMOGLOBIN (HGB A1C): Hemoglobin A1C: 8.6 % — AB (ref 4.0–5.6)

## 2023-10-12 MED ORDER — GLIPIZIDE ER 10 MG PO TB24: 10.0000 mg | ORAL_TABLET | Freq: Two times a day (BID) | ORAL | 2 refills | Status: AC

## 2023-10-12 MED ORDER — EMPAGLIFLOZIN 10 MG PO TABS: 10.0000 mg | ORAL_TABLET | Freq: Every day | ORAL | 1 refills | Status: DC

## 2023-10-12 MED ORDER — CIPROFLOXACIN-DEXAMETHASONE 0.3-0.1 % OT SUSP: OTIC | 0 refills | Status: DC

## 2023-10-12 MED ORDER — FENOFIBRATE 48 MG PO TABS
48.0000 mg | ORAL_TABLET | Freq: Every day | ORAL | 1 refills | Status: DC
Start: 2023-10-12 — End: 2024-03-15

## 2023-10-12 MED ORDER — OFLOXACIN 0.3 % OT SOLN: OTIC | 0 refills | Status: DC

## 2023-10-12 NOTE — Assessment & Plan Note (Signed)
 Lab Results  Component Value Date   HGBA1C 8.6 (A) 10/12/2023  A1c improved but not at goal of less than 7 Continue metformin  1000 mg twice daily, glipizide  10 mg twice daily Start Jardiance 10 mg daily CBG goals provided Counseled on low-carb diet Up-to-date with diabetic eye exam records requested Follow-up in 1 month

## 2023-10-12 NOTE — Patient Instructions (Signed)
 1. Type 2 diabetes mellitus without complication, without long-term current use of insulin  (HCC) (Primary)  - POCT glycosylated hemoglobin (Hb A1C) - glipiZIDE  (GLUCOTROL  XL) 10 MG 24 hr tablet; Take 1 tablet (10 mg total) by mouth 2 (two) times daily with a meal.  Dispense: 180 tablet; Refill: 2 - Lipid panel - empagliflozin (JARDIANCE) 10 MG TABS tablet; Take 1 tablet (10 mg total) by mouth daily before breakfast.  Dispense: 30 tablet; Refill: 1  2. Need for shingles vaccine   3. Need for pneumococcal 20-valent conjugate vaccination   4. Ear pain, left  - ciprofloxacin -dexamethasone  (CIPRODEX ) OTIC suspension; Instill 4 drops into affected ear(s) twice daily for 7 days  Dispense: 7.5 mL; Refill: 0     Around 3 times per week, check your blood pressure 2 times per day. once in the morning and once in the evening. The readings should be at least one minute apart. Write down these values and bring them to your next nurse visit/appointment.  When you check your BP, make sure you have been doing something calm/relaxing 5 minutes prior to checking. Both feet should be flat on the floor and you should be sitting. Use your left arm and make sure it is in a relaxed position (on a table), and that the cuff is at the approximate level/height of your heart.  Blood pressure goal is less than 130/80  Goal for fasting blood sugar ranges from 80 to 120 and 2 hours after any meal or at bedtime should be between 130 to 170.     It is important that you exercise regularly at least 30 minutes 5 times a week as tolerated  Think about what you will eat, plan ahead. Choose  clean, green, fresh or frozen over canned, processed or packaged foods which are more sugary, salty and fatty. 70 to 75% of food eaten should be vegetables and fruit. Three meals at set times with snacks allowed between meals, but they must be fruit or vegetables. Aim to eat over a 12 hour period , example 7 am to 7 pm, and STOP  after  your last meal of the day. Drink water ,generally about 64 ounces per day, no other drink is as healthy. Fruit juice is best enjoyed in a healthy way, by EATING the fruit.  Thanks for choosing [facility] we consider it a privelige to serve you.

## 2023-10-12 NOTE — Assessment & Plan Note (Addendum)
 Lab Results  Component Value Date   CHOL 167 07/07/2023   HDL 29 (L) 07/07/2023   LDLCALC 98 07/07/2023   LDLDIRECT 98 05/05/2023   TRIG 234 (H) 07/07/2023   CHOLHDL 5.8 (H) 07/07/2023  LDL goal is less than 70 Currently on atorvastatin  80 mg daily, Zetia  10 mg daily, Repatha  was ordered but not approved by insurance, they would like cardiology to order the medication. Checking lipid panel, if lipid panel remains elevated will notify cardiology Avoid fatty fried foods

## 2023-10-12 NOTE — Assessment & Plan Note (Signed)

## 2023-10-12 NOTE — Assessment & Plan Note (Signed)
 Patient complains of ear pain that started over a week ago.  He denies fever, chills, ear drainage.  Has history of chronic sinusitis - ciprofloxacin -dexamethasone  (CIPRODEX ) OTIC suspension; Instill 4 drops into affected ear(s) twice daily for 7 days  Dispense: 7.5 mL; Refill: 0

## 2023-10-12 NOTE — Progress Notes (Signed)
 Established Patient Office Visit  Subjective:  Patient ID: Scott Buchanan, male    DOB: May 07, 1965  Age: 58 y.o. MRN: [id number]  CC:  Chief Complaint  Patient presents with   Hyperlipidemia   Diabetes   Ear Fullness    For a week    HPI Scott Buchanan is a 58 y.o. male  has a past medical history of Arthritis, Asthma, Diabetes mellitus without complication (HCC), Fatty liver (11/20/2019), Hyperlipidemia associated with type 2 diabetes mellitus (HCC) (05/14/2018), Hypertension, Morbid obesity (HCC) (07/27/2017), Primary localized osteoarthritis of right knee (03/02/2018), Seasonal allergies (11/09/2013), Severe persistent asthma without complication (05/16/2021), and Type 2 diabetes mellitus without complication, without long-term current use of insulin  (HCC) (05/12/2017).  Patient presents for follow-up for his chronic medical conditions   Hypertension.  Currently on amlodipine  10 mg daily, hydrochlorothiazide  25 mg daily, carvedilol  25 mg twice daily, recently started on olmesartan 20 mg daily.  Patient currently denies chest pain shortness of breath edema   Type 2 diabetes.  Currently on glipizide  10 mg twice daily, metformin  1000 mg twice daily.  Takes atorvastatin  80 mg daily and Zetia  10 mg daily for hyperlipidemia.  No complaints of polyuria polyphagia polydipsia       Past Medical History:  Diagnosis Date   Arthritis    Asthma    Diabetes mellitus without complication (HCC)    type 2   Fatty liver 11/20/2019   Hyperlipidemia associated with type 2 diabetes mellitus (HCC) 05/14/2018   Hypertension    Morbid obesity (HCC) 07/27/2017   Primary localized osteoarthritis of right knee 03/02/2018   Seasonal allergies 11/09/2013   Severe persistent asthma without complication 05/16/2021   Type 2 diabetes mellitus without complication, without long-term current use of insulin  (HCC) 05/12/2017    Past Surgical History:  Procedure Laterality Date   CYSTECTOMY      tailbone   ETHMOIDECTOMY Bilateral 04/07/2017   Procedure: BILATERAL ETHMOIDECTOMY AND SPHENOIDECTOMY;  Surgeon: Karis Clunes, MD;  Location: [facility];  Service: ENT;  Laterality: Bilateral;   KNEE ARTHROSCOPY W/ MENISCAL REPAIR     right and left knee one year apart   LEFT HEART CATH AND CORONARY ANGIOGRAPHY N/A 09/04/2020   Procedure: LEFT HEART CATH AND CORONARY ANGIOGRAPHY;  Surgeon: [place], Peter M, MD;  Location: [facility];  Service: Cardiovascular;  Laterality: N/A;   PARTIAL KNEE ARTHROPLASTY Right 03/02/2018   Procedure: UNICOMPARTMENTAL KNEE;  Surgeon: Josefina Chew, MD;  Location: [facility];  Service: Orthopedics;  Laterality: Right;  with block   SINUS ENDO WITH FUSION Bilateral 04/07/2017   Procedure: BILATERAL FRONTAL RECESS SINUS EXPLORATION WITH SINUS FUSION NAVIGATION;  Surgeon: Karis Clunes, MD;  Location: [facility];  Service: ENT;  Laterality: Bilateral;   SINUS EXPLORATION     TONSILLECTOMY     TURBINATE REDUCTION Bilateral 04/07/2017   Procedure: BILATERAL TURBINATE REDUCTION;  Surgeon: Karis Clunes, MD;  Location: [facility];  Service: ENT;  Laterality: Bilateral;   unicompartmental right knee      Family History  Problem Relation Age of Onset   Hypertension Mother    Colon cancer Neg Hx    Colon polyps Neg Hx    Stomach cancer Neg Hx    Rectal cancer Neg Hx    Esophageal cancer Neg Hx    Inflammatory bowel disease Neg Hx    Liver disease Neg Hx    Pancreatic cancer Neg Hx     Social History  Socioeconomic History   Marital status: Single    Spouse name: Not on file   Number of children: 1   Years of education: Not on file   Highest education level: Bachelor's degree (e.g., BA, AB, BS)  Occupational History   Occupation: maintance   Tobacco Use   Smoking status: Former    Types: Cigarettes   Smokeless tobacco: Never   Tobacco comments:    quit 10 years ago  Vaping Use   Vaping status: Never Used   Substance and Sexual Activity   Alcohol use: No   Drug use: No   Sexual activity: Yes  Other Topics Concern   Not on file  Social History Narrative   Not on file   Social Drivers of Health   Financial Resource Strain: Medium Risk (10/08/2023)   Overall Financial Resource Strain (CARDIA)    Difficulty of Paying Living Expenses: Somewhat hard  Food Insecurity: No Food Insecurity (10/08/2023)   Hunger Vital Sign    Worried About Running Out of Food in the Last Year: Never true    Ran Out of Food in the Last Year: Never true  Transportation Needs: No Transportation Needs (10/08/2023)   PRAPARE - Administrator, Civil Service (Medical): No    Lack of Transportation (Non-Medical): No  Physical Activity: Insufficiently Active (10/08/2023)   Exercise Vital Sign    Days of Exercise per Week: 4 days    Minutes of Exercise per Session: 30 min  Stress: No Stress Concern Present (10/08/2023)   [employer] of Occupational Health - Occupational Stress Questionnaire    Feeling of Stress: Only a little  Social Connections: Socially Isolated (10/08/2023)   Social Connection and Isolation Panel    Frequency of Communication with Friends and Family: Once a week    Frequency of Social Gatherings with Friends and Family: Once a week    Attends Religious Services: Never    Database administrator or Organizations: No    Attends Engineer, structural: Not on file    Marital Status: Never married  Intimate Partner Violence: Not on file    Outpatient Medications Prior to Visit  Medication Sig Dispense Refill   acetaminophen  (TYLENOL ) 500 MG tablet Take 500 mg by mouth every 6 (six) hours as needed for headache, mild pain (pain score 1-3) or moderate pain (pain score 4-6).     albuterol  (VENTOLIN  HFA) 108 (90 Base) MCG/ACT inhaler Inhale 2 puffs into the lungs every 4 (four) hours as needed for wheezing or shortness of breath. 18 g 3   amLODipine  (NORVASC ) 10 MG tablet Take 1  tablet (10 mg total) by mouth daily. 90 tablet 2   Ascorbic Acid  (VITAMIN C ) 1000 MG tablet Take 2,000 mg by mouth daily.     aspirin  EC 81 MG tablet Take 81 mg by mouth daily.     atorvastatin  (LIPITOR) 80 MG tablet Take 1 tablet (80 mg total) by mouth daily. 90 tablet 1   carvedilol  (COREG ) 25 MG tablet Take 1 tablet (25 mg total) by mouth 2 (two) times daily with a meal. 60 tablet 3   cholecalciferol  (VITAMIN D ) 1000 units tablet Take 2,000 Units by mouth daily.     EPINEPHrine  0.3 mg/0.3 mL IJ SOAJ injection Inject 0.3 mg into the muscle as needed for anaphylaxis. 2 each 1   ezetimibe  (ZETIA ) 10 MG tablet Take 1 tablet (10 mg total) by mouth daily. 90 tablet 1   fluticasone  (FLONASE ) 50 MCG/ACT  nasal spray Place 2 sprays into both nostrils daily. 16 g 0   gabapentin  (NEURONTIN ) 300 MG capsule Take 1 capsule by mouth at bedtime 90 capsule 0   hydrochlorothiazide  (HYDRODIURIL ) 25 MG tablet Take 1 tablet (25 mg total) by mouth daily. 90 tablet 1   levocetirizine (XYZAL ) 5 MG tablet TAKE 1 TABLET BY MOUTH ONCE DAILY IN THE EVENING 90 tablet 0   metFORMIN  (GLUCOPHAGE ) 1000 MG tablet Take 1 tablet (1,000 mg total) by mouth 2 (two) times daily with a meal. 180 tablet 1   montelukast  (SINGULAIR ) 10 MG tablet TAKE 1 TABLET BY MOUTH AT BEDTIME 90 tablet 0   olmesartan (BENICAR) 20 MG tablet Take 1 tablet (20 mg total) by mouth daily. 90 tablet 3   omeprazole  (PRILOSEC) 40 MG capsule Take 1 capsule by mouth once daily 90 capsule 0   oxybutynin  (DITROPAN -XL) 10 MG 24 hr tablet Take 1 tablet (10 mg total) by mouth daily. 90 tablet 1   sodium chloride  (OCEAN) 0.65 % SOLN nasal spray Place 1 spray into both nostrils as needed.     tamsulosin  (FLOMAX ) 0.4 MG CAPS capsule Take 1 capsule (0.4 mg total) by mouth daily. 90 capsule 3   glipiZIDE  (GLUCOTROL  XL) 10 MG 24 hr tablet Take 1 tablet (10 mg total) by mouth 2 (two) times daily with a meal. 180 tablet 2   No facility-administered medications prior to  visit.    No Known Allergies  ROS Review of Systems  Constitutional:  Negative for appetite change, chills, fatigue and fever.  HENT:  Positive for ear pain. Negative for congestion, dental problem, ear discharge and facial swelling.   Eyes:  Negative for pain, discharge and itching.  Respiratory:  Negative for cough, shortness of breath and wheezing.   Cardiovascular:  Negative for chest pain, palpitations and leg swelling.  Gastrointestinal:  Negative for abdominal pain, constipation, nausea and vomiting.  Genitourinary:  Negative for difficulty urinating, dysuria, flank pain and frequency.  Musculoskeletal:  Negative for arthralgias, back pain, joint swelling and myalgias.  Skin:  Negative for color change, pallor, rash and wound.  Neurological:  Negative for dizziness, facial asymmetry, weakness, numbness and headaches.  Psychiatric/Behavioral:  Negative for behavioral problems, confusion, self-injury and suicidal ideas.       Objective:    Physical Exam Vitals and nursing note reviewed.  Constitutional:      General: He is not in acute distress.    Appearance: Normal appearance. He is obese. He is not ill-appearing, toxic-appearing or diaphoretic.  HENT:     Right Ear: Tympanic membrane, ear canal and external ear normal. There is no impacted cerumen.     Left Ear: Tympanic membrane and external ear normal. There is no impacted cerumen.     Nose: No congestion or rhinorrhea.     Mouth/Throat:     Mouth: Mucous membranes are moist.     Pharynx: Oropharynx is clear. No oropharyngeal exudate or posterior oropharyngeal erythema.   Eyes:     General: No scleral icterus.       Right eye: No discharge.        Left eye: No discharge.     Extraocular Movements: Extraocular movements intact.     Conjunctiva/sclera: Conjunctivae normal.    Cardiovascular:     Rate and Rhythm: Normal rate and regular rhythm.     Pulses: Normal pulses.     Heart sounds: Normal heart sounds. No  murmur heard.    No friction rub. No  gallop.  Pulmonary:     Effort: Pulmonary effort is normal. No respiratory distress.     Breath sounds: Normal breath sounds. No stridor. No wheezing, rhonchi or rales.  Chest:     Chest wall: No tenderness.  Abdominal:     General: There is no distension.     Palpations: Abdomen is soft.     Tenderness: There is no abdominal tenderness. There is no right CVA tenderness, left CVA tenderness or guarding.   Musculoskeletal:        General: No swelling, tenderness, deformity or signs of injury.     Right lower leg: No edema.     Left lower leg: No edema.   Skin:    General: Skin is warm and dry.     Capillary Refill: Capillary refill takes less than 2 seconds.     Coloration: Skin is not jaundiced or pale.     Findings: No bruising, erythema or lesion.   Neurological:     Mental Status: He is alert and oriented to person, place, and time.     Motor: No weakness.     Coordination: Coordination normal.     Gait: Gait normal.   Psychiatric:        Mood and Affect: Mood normal.        Behavior: Behavior normal.        Thought Content: Thought content normal.        Judgment: Judgment normal.     BP 133/75   Pulse 74   Temp (!) 97.2 F (36.2 C)   Wt (!) 324 lb (147 kg)   SpO2 97%   BMI 39.44 kg/m  Wt Readings from Last 3 Encounters:  10/12/23 (!) 324 lb (147 kg)  10/07/23 (!) 324 lb 9.6 oz (147.2 kg)  08/12/23 (!) 323 lb (146.5 kg)    Lab Results  Component Value Date   TSH 1.270 03/13/2020   Lab Results  Component Value Date   WBC 7.7 03/18/2021   HGB 14.3 03/18/2021   HCT 42.7 03/18/2021   MCV 85 03/18/2021   PLT 397 03/18/2021   Lab Results  Component Value Date   NA 140 04/21/2023   K 4.0 04/21/2023   CO2 22 04/21/2023   GLUCOSE 212 (H) 04/21/2023   BUN 13 04/21/2023   CREATININE 0.90 04/21/2023   BILITOT 0.4 12/30/2022   ALKPHOS 57 12/30/2022   AST 28 12/30/2022   ALT 51 (H) 12/30/2022   PROT 7.0 12/30/2022    ALBUMIN 4.5 12/30/2022   CALCIUM  9.3 04/21/2023   ANIONGAP 13 06/11/2020   EGFR 101 04/21/2023   Lab Results  Component Value Date   CHOL 167 07/07/2023   Lab Results  Component Value Date   HDL 29 (L) 07/07/2023   Lab Results  Component Value Date   LDLCALC 98 07/07/2023   Lab Results  Component Value Date   TRIG 234 (H) 07/07/2023   Lab Results  Component Value Date   CHOLHDL 5.8 (H) 07/07/2023   Lab Results  Component Value Date   HGBA1C 8.6 (A) 10/12/2023      Assessment & Plan:   Problem List Items Addressed This Visit       Cardiovascular and Mediastinum   Essential hypertension   Blood pressure is much improved Continue carvedilol  25 mg twice daily, amlodipine  10 mg daily, hydrochlorothiazide  25 mg daily, olmesartan 20 mg daily DASH diet and commitment to daily physical activity for a minimum of 30 minutes discussed  and encouraged, as a part of hypertension management. The importance of attaining a healthy weight is also discussed. BMP next week     10/12/2023    8:35 AM 10/12/2023    8:28 AM 10/07/2023   10:28 AM 08/12/2023    8:15 AM 08/12/2023    8:09 AM 07/07/2023    8:23 AM 07/07/2023    8:21 AM  BP/Weight  Systolic BP 133 139 142 151 152 151 153  Diastolic BP 75 76 82 85 86 90 94  Wt. (Lbs)  324 324.6  323    BMI  39.44 kg/m2 39.51 kg/m2  39.32 kg/m2             Relevant Medications   fenofibrate (TRICOR) 48 MG tablet   Other Relevant Orders   Basic Metabolic Panel     Endocrine   Type 2 diabetes mellitus without complication, without long-term current use of insulin  (HCC) - Primary   Lab Results  Component Value Date   HGBA1C 8.6 (A) 10/12/2023  A1c improved but not at goal of less than 7 Continue metformin  1000 mg twice daily, glipizide  10 mg twice daily Start Jardiance 10 mg daily CBG goals provided Counseled on low-carb diet Up-to-date with diabetic eye exam records requested Follow-up in 1 month      Relevant Medications    glipiZIDE  (GLUCOTROL  XL) 10 MG 24 hr tablet   empagliflozin (JARDIANCE) 10 MG TABS tablet   Other Relevant Orders   POCT glycosylated hemoglobin (Hb A1C) (Completed)   Lipid panel     Other   Dyslipidemia, goal LDL below 70   Lab Results  Component Value Date   CHOL 167 07/07/2023   HDL 29 (L) 07/07/2023   LDLCALC 98 07/07/2023   LDLDIRECT 98 05/05/2023   TRIG 234 (H) 07/07/2023   CHOLHDL 5.8 (H) 07/07/2023  LDL goal is less than 70 Currently on atorvastatin  80 mg daily, Zetia  10 mg daily, Repatha  was ordered but not approved by insurance, they would like cardiology to order the medication. Checking lipid panel, if lipid panel remains elevated will notify cardiology Avoid fatty fried foods      Relevant Medications   fenofibrate (TRICOR) 48 MG tablet   Need for pneumococcal 20-valent conjugate vaccination   Patient educated on CDC recommendation for the vaccine. Verbal consent was obtained from the patient, vaccine administered by nurse, no sign of adverse reactions noted at this time. Patient education on arm soreness and use of tylenol  for this patient  was discussed. Patient educated on the signs and symptoms of adverse effect and advise to contact the office if they occur. Vaccine information sheet given to patient.        Relevant Orders   Pneumococcal conjugate vaccine 20-valent (Completed)   Need for shingles vaccine   Patient educated on CDC recommendation for the vaccine. Verbal consent was obtained from the patient, vaccine administered by nurse, no sign of adverse reactions noted at this time. Patient education on arm soreness and use of tylenol  for this patient  was discussed. Patient educated on the signs and symptoms of adverse effect and advise to contact the office if they occur. Vaccine information sheet given to patient.        Relevant Orders   Varicella-zoster vaccine subcutaneous (Completed)   Ear pain, left   Patient complains of ear pain that started  over a week ago.  He denies fever, chills, ear drainage.  Has history of chronic sinusitis - ciprofloxacin -dexamethasone  (CIPRODEX ) OTIC suspension;  Instill 4 drops into affected ear(s) twice daily for 7 days  Dispense: 7.5 mL; Refill: 0       Relevant Medications   ciprofloxacin -dexamethasone  (CIPRODEX ) OTIC suspension   Hypertriglyceridemia   Lab Results  Component Value Date   CHOL 167 07/07/2023   HDL 29 (L) 07/07/2023   LDLCALC 98 07/07/2023   LDLDIRECT 98 05/05/2023   TRIG 234 (H) 07/07/2023   CHOLHDL 5.8 (H) 07/07/2023  Start fenofibrate 48 mg daily Avoid fatty fried foods      Relevant Medications   fenofibrate (TRICOR) 48 MG tablet    Meds ordered this encounter  Medications   glipiZIDE  (GLUCOTROL  XL) 10 MG 24 hr tablet    Sig: Take 1 tablet (10 mg total) by mouth 2 (two) times daily with a meal.    Dispense:  180 tablet    Refill:  2   empagliflozin (JARDIANCE) 10 MG TABS tablet    Sig: Take 1 tablet (10 mg total) by mouth daily before breakfast.    Dispense:  30 tablet    Refill:  1   ciprofloxacin -dexamethasone  (CIPRODEX ) OTIC suspension    Sig: Instill 4 drops into affected ear(s) twice daily for 7 days    Dispense:  7.5 mL    Refill:  0   fenofibrate (TRICOR) 48 MG tablet    Sig: Take 1 tablet (48 mg total) by mouth daily.    Dispense:  60 tablet    Refill:  1    Follow-up: Return in about 4 weeks (around 11/09/2023) for HTN, DM.    Ivanell Deshotel R Alexiana Laverdure, FNP

## 2023-10-12 NOTE — Assessment & Plan Note (Signed)
 Lab Results  Component Value Date   CHOL 167 07/07/2023   HDL 29 (L) 07/07/2023   LDLCALC 98 07/07/2023   LDLDIRECT 98 05/05/2023   TRIG 234 (H) 07/07/2023   CHOLHDL 5.8 (H) 07/07/2023  Start fenofibrate 48 mg daily Avoid fatty fried foods

## 2023-10-12 NOTE — Assessment & Plan Note (Signed)
 Blood pressure is much improved Continue carvedilol  25 mg twice daily, amlodipine  10 mg daily, hydrochlorothiazide  25 mg daily, olmesartan 20 mg daily DASH diet and commitment to daily physical activity for a minimum of 30 minutes discussed and encouraged, as a part of hypertension management. The importance of attaining a healthy weight is also discussed. BMP next week     10/12/2023    8:35 AM 10/12/2023    8:28 AM 10/07/2023   10:28 AM 08/12/2023    8:15 AM 08/12/2023    8:09 AM 07/07/2023    8:23 AM 07/07/2023    8:21 AM  BP/Weight  Systolic BP 133 139 142 151 152 151 153  Diastolic BP 75 76 82 85 86 90 94  Wt. (Lbs)  324 324.6  323    BMI  39.44 kg/m2 39.51 kg/m2  39.32 kg/m2

## 2023-10-14 ENCOUNTER — Ambulatory Visit: Payer: Self-pay | Admitting: Urology

## 2023-10-14 ENCOUNTER — Other Ambulatory Visit: Payer: Self-pay | Admitting: Nurse Practitioner

## 2023-10-14 DIAGNOSIS — E119 Type 2 diabetes mellitus without complications: Secondary | ICD-10-CM

## 2023-10-14 MED ORDER — INSULIN GLARGINE 100 UNIT/ML SOLOSTAR PEN: 10.0000 [IU] | PEN_INJECTOR | Freq: Every day | SUBCUTANEOUS | 11 refills | Status: DC

## 2023-10-14 MED ORDER — INSULIN PEN NEEDLE 32G X 4 MM MISC
3 refills | Status: AC
Start: 2023-10-14 — End: ?

## 2023-10-15 ENCOUNTER — Other Ambulatory Visit: Payer: Self-pay | Admitting: Nurse Practitioner

## 2023-10-15 NOTE — Telephone Encounter (Signed)
 Pt was lvm and and sent a my chart message. KH

## 2023-10-19 ENCOUNTER — Other Ambulatory Visit: Payer: Self-pay

## 2023-10-20 ENCOUNTER — Ambulatory Visit: Admitting: Urology

## 2023-10-26 ENCOUNTER — Other Ambulatory Visit: Payer: Self-pay

## 2023-10-26 DIAGNOSIS — I10 Essential (primary) hypertension: Secondary | ICD-10-CM

## 2023-10-26 DIAGNOSIS — E119 Type 2 diabetes mellitus without complications: Secondary | ICD-10-CM

## 2023-10-27 LAB — BASIC METABOLIC PANEL WITH GFR
BUN/Creatinine Ratio: 21 — ABNORMAL HIGH (ref 9–20)
BUN: 25 mg/dL — ABNORMAL HIGH (ref 6–24)
CO2: 21 mmol/L (ref 20–29)
Calcium: 10.4 mg/dL — ABNORMAL HIGH (ref 8.7–10.2)
Chloride: 101 mmol/L (ref 96–106)
Creatinine, Ser: 1.17 mg/dL (ref 0.76–1.27)
Glucose: 199 mg/dL — ABNORMAL HIGH (ref 70–99)
Potassium: 4.5 mmol/L (ref 3.5–5.2)
Sodium: 138 mmol/L (ref 134–144)
eGFR: 73 mL/min/1.73 (ref >59)

## 2023-10-27 LAB — LIPID PANEL
Chol/HDL Ratio: 5.1 ratio — ABNORMAL HIGH (ref 0.0–5.0)
Cholesterol, Total: 139 mg/dL (ref 100–199)
HDL: 27 mg/dL — ABNORMAL LOW (ref >39)
LDL Chol Calc (NIH): 77 mg/dL (ref 0–99)
Triglycerides: 208 mg/dL — ABNORMAL HIGH (ref 0–149)
VLDL Cholesterol Cal: 35 mg/dL (ref 5–40)

## 2023-11-02 ENCOUNTER — Other Ambulatory Visit (HOSPITAL_COMMUNITY): Payer: Self-pay

## 2023-11-02 ENCOUNTER — Telehealth: Payer: Self-pay | Admitting: Pharmacy Technician

## 2023-11-02 ENCOUNTER — Ambulatory Visit: Payer: Self-pay | Admitting: Nurse Practitioner

## 2023-11-02 NOTE — Telephone Encounter (Addendum)
 Pharmacy Patient Advocate Encounter   Received notification from Physician's Office that prior authorization for Repatha  is required/requested.   Insurance verification completed.   The patient is insured through [employer] .   Per test claim: PA required; PA submitted to above mentioned insurance via CoverMyMeds Key/confirmation #/EOC [facility] Status is pending

## 2023-11-03 NOTE — Telephone Encounter (Addendum)
 Pharmacy Patient Advocate Encounter  Received notification from [employer] that Prior Authorization for Repatha  has been DENIED.  Full denial letter will be uploaded to the media tab. See denial reason below. Need updated chart note to reflect:    PA #/Case ID/Reference #: [id number]

## 2023-11-09 ENCOUNTER — Telehealth: Payer: Self-pay | Admitting: Pharmacy Technician

## 2023-11-09 NOTE — Telephone Encounter (Signed)
 Pharmacy Patient Advocate Encounter   Received notification from Pt Calls Messages that prior authorization for Repatha  is required/requested.   Insurance verification completed.   The patient is insured through [employer] .   Per test claim: PA required; PA submitted to above mentioned insurance via CoverMyMeds Key/confirmation #/EOC [id number] Status is pending

## 2023-11-10 NOTE — Telephone Encounter (Signed)
 Pharmacy Patient Advocate Encounter  Received notification from [employer] that Prior Authorization for repatha  has been DENIED.  Full denial letter will be uploaded to the media tab. See denial reason below.  Also, Submitted chart notes as asked- 06/11 visit from layna fox    PA #/Case ID/Reference #: [id number]

## 2023-11-12 ENCOUNTER — Other Ambulatory Visit (HOSPITAL_COMMUNITY): Payer: Self-pay

## 2023-11-12 NOTE — Telephone Encounter (Signed)
 Called to set up the peer to peer and they asked about the heart healthy diet and then approved for 3 months from 11/12/23-  Ran test claim and copay was $553.04- for one month

## 2023-11-13 ENCOUNTER — Encounter: Payer: Self-pay | Admitting: Pharmacy Technician

## 2023-11-13 ENCOUNTER — Other Ambulatory Visit (HOSPITAL_COMMUNITY): Payer: Self-pay

## 2023-11-13 NOTE — Telephone Encounter (Signed)
 Can we see if the copay card will bring it down? You are the best for getting it approved!

## 2023-11-13 NOTE — Telephone Encounter (Signed)
 Copay card obtained:   Rejection in [facility]: patient has govt plan

## 2023-11-13 NOTE — Telephone Encounter (Signed)
 error

## 2023-11-14 ENCOUNTER — Other Ambulatory Visit: Payer: Self-pay | Admitting: Nurse Practitioner

## 2023-11-16 ENCOUNTER — Other Ambulatory Visit (HOSPITAL_COMMUNITY): Payer: Self-pay

## 2023-11-16 ENCOUNTER — Telehealth: Payer: Self-pay

## 2023-11-16 ENCOUNTER — Other Ambulatory Visit: Payer: Self-pay

## 2023-11-16 MED ORDER — REPATHA SURECLICK 140 MG/ML ~~LOC~~ SOAJ
1.0000 mL | SUBCUTANEOUS | 11 refills | Status: DC
  Filled 2023-11-16: qty 2, 28d supply, fill #0

## 2023-11-16 NOTE — Progress Notes (Unsigned)
 11/16/2023 Name: Scott Buchanan MRN: [id number] DOB: [birth year]  No chief complaint on file.   Scott Buchanan is a 58 y.o. year old male who presented for a telephone visit.   They were referred to the pharmacist by their PCP for assistance in managing diabetes and hypertension. PMH includes HTN, angina, asthma, MASL, T2DM, HLD,   Subjective: Patient was last seen by PCP, [name], NP on 10/12/23. BP was improved at 133/75 mmHg after olmesartan  20 mg daily was started by Cardiology (Dr. [place]). A1C had improved from 9.6% to 8.6%. He was instructed to start Jardiance  10 mg daily - which was cost-prohibitive at the pharmacy. He was instead instructed to start Lantus  10 units daily. He was also instructed to start fenofibrate  48 mcg daily for TG of 208 mg/dL.   OLD:  BP was 151/85 mmHg, HR 72. He reported that he has been taking glipizide  10 mg daily and metformin  as prescribed. He has not been able to access high-cost drugs due to high deductible plan. He was instructed to increase glipizide  to 10 mg twice daily. He was instructed to stop metoprolol  and start carvedilol  25 mg BID for BP control.   Today, patient reports doing well.***  Care Team: Primary Care Provider: [name], [name], FNP ; Next Scheduled Visit: 10/12/23  Medication Access/Adherence  Current Pharmacy:  [facility] [address] ([address]), [place] - [address] [place] [address] Phone: [phone] Fax: [phone]   Patient reports affordability concerns with their medications: Yes  - high deductible insurance plan Patient reports access/transportation concerns to their pharmacy: No  Patient reports adherence concerns with their medications:  No    Diabetes:  Current medications: metformin  1000 mg BID, glipizide  10 mg twice daily with meals (reports that he has been out since last week - because he states that [facility] would not refill until 6/10. He plans to follow-up  today), Lantus  10 units daily  Medications tried in the past: unable to afford brand name medications with high-deductible plan  Current glucose readings:  Using glucometer - states that he purchases them OTC, likely Relion; testing 1 time daily - reports average fasting is ~125 mg/dL, reports recently BG have usually been  130 mg/dL since starting glipizide  twice daily.   Patient denies hypoglycemic s/sx including dizziness, shakiness, sweating. Patient denies hyperglycemic symptoms including polyuria, polyphagia, nocturia, neuropathy, blurred vision. Patient endorses occasional polydipsia with dietary excursions.   Current meal patterns: 3 meals a day. Reports that he has increased vegetables. Attempting to stay away from white bread, replaced with wheat bread. Reports that he has switched to zero sugar sodas. Drinks 3-4 bottles of water /day. Reports that sometimes he drinks crystal light.  - Snacks: blackberries, raspberries, a few grapes - usually no protein with this. Has kind breakfast bars. Greek yogurt - Chobani.  - Drinks: Reports that he has switched to zero sugar sodas. Drinks 3-4 bottles of water /day. Reports that sometimes he drinks crystal light.   Current physical activity: did not discuss today  Current medication access support: none  Hypertension:  Current medications: carvedilol  25 mg BID, amlodipine  10 mg daily, hydrochlorothiazide  25 mg daily  Medications previously tried:   Add ARB - fell off bc he did not schedule f/u appt? - no hx of elevated K or SCr  Patient has an automated, upper arm home BP cuff. - reports that he checked last week.  Current blood pressure readings readings: Recalls 145/80s mmHg.  Patient denies hypotensive s/sx including dizziness, lightheadedness.  Patient denies hypertensive symptoms including headache, chest pain, shortness of breath  Hyperlipidemia/ASCVD Risk Reduction  Current lipid lowering medications: atorvastatin  80 mg daily,  ezetimibe  10 mg daily Medications tried in the past:   Antiplatelet regimen:   ASCVD History: LHC in May 2022 without evidence of CAD. Lexiscan  in 2022 with findings c/w ischemia and prior MI and LVEF mildly decreased to 45-54%.  Family History:  Risk Factors: clinical ASCVD (premature ASCVD), T2DM, HTN, hx of tobacco use  Clinical ASCVD: Yes  The 10-year ASCVD risk score (Arnett DK, et al., 2019) is: 16%   Values used to calculate the score:     Age: 71 years     Clincally relevant sex: Male     Is Non-Hispanic African American: No     Diabetic: Yes     Tobacco smoker: No     Systolic Blood Pressure: 133 mmHg     Is BP treated: Yes     HDL Cholesterol: 27 mg/dL     Total Cholesterol: 139 mg/dL    Objective:  BP Readings from Last 3 Encounters:  10/12/23 133/75  10/07/23 (!) 142/82  08/12/23 (!) 151/85    Lab Results  Component Value Date   HGBA1C 8.6 (A) 10/12/2023   HGBA1C 9.6 (A) 07/07/2023   HGBA1C 7.4 (A) 03/31/2023    UACR 03/31/23: 143 mg/g  Lab Results  Component Value Date   CREATININE 1.17 10/26/2023   BUN 25 (H) 10/26/2023   NA 138 10/26/2023   K 4.5 10/26/2023   CL 101 10/26/2023   CO2 21 10/26/2023    Lab Results  Component Value Date   CHOL 139 10/26/2023   HDL 27 (L) 10/26/2023   LDLCALC 77 10/26/2023   LDLDIRECT 98 05/05/2023   TRIG 208 (H) 10/26/2023   CHOLHDL 5.1 (H) 10/26/2023    Medications Reviewed Today   Medications were not reviewed in this encounter       Assessment/Plan:   Diabetes: - Currently uncontrolled with last A1C of 9.6% above goal < 7%. Patient reports average SMBG fasting is 125 mg/dL, and reports that since taking glipizide  twice daily, AM sugar is usually < 130 mg/dL. He is not monitoring post-prandial. He is not having s/sx of hypoglycemia, but is able to verbalize management plan. He is not able to access brand name medications due to high-deductible plan. He is not a canddiate for pioglitazone due to hx of  HFrEF. Therefore, if next A1C remains uncontrolled - likely need to consider addition of basal insulin  (which would likely be $35 mo with his insurance). Patient has PCP appt next week to monitor.  - Reviewed long term cardiovascular and renal outcomes of uncontrolled blood sugar - Reviewed goal A1c, goal fasting, and goal 2 hour post prandial glucose - Reviewed dietary modifications including reducing intake of carbohydrate-heavy foods and increasing intake of protein and non-starchy vegetables. Reviewed reading nutrition labels today.  - Reviewed lifestyle modifications including: Increasing water  intake to 5-6 bottles daily - Recommend to continue metformin  1000 mg BID, glipizide  10 mg twice daily with meals   - Recommend to check glucose once daily fasting and occasionally 2-hr PPG - Next A1C at PCP appt on 10/12/23   Hypertension: - Currently uncontrolled with last clinic BP above goal < 130/80 mmHg, which is consistent with home monitoring as well. Patient did switch from metoprolol  to carvedilol  and is tolerating well. Appears he did previously take valsartan  (in 2023). No  AE listed as reason for discontinuation, and patient does not recall issue. No hx of elevated K or Scr. Would recommend addition of valsartan  if BP remains uncontrolled at PCP appt next week. Offered to initiate today, but patient declined, since he is coming in for an appt next week This is also appropriate for his elevated UACR to 143 mg/g.  - Reviewed long term cardiovascular and renal outcomes of uncontrolled blood pressure - Reviewed appropriate blood pressure monitoring technique and reviewed goal blood pressure. Recommended to check home blood pressure and heart rate once daily. Instructed to bring log to next PCP appt.  - Recommend to continue carvedilol  25 mg BID, amlodipine  10 mg daily, hydrochlorothiazide  25 mg daily  - Recommend to start valsartan  80 mg daily at next PCP appt if BP remains elevated. May consider  starting at lower dose (40 mg daily) for microalbuminuria, if BP is controlled.     Hyperlipidemia/ASCVD Risk Reduction: - Currently uncontrolled with last LDL-C of 98 mg/dL above goal < 55 mg/dL given premature ASCVD (findings of ischemia on Lexiscan  in 2022). Patient has not had lipid panel checked since starting high intensity statin + ezetimibe . Will recheck at upcoming PCP appt. Anticipate Repatha  will not be accessible with high-deductible insurance plan.  - Reviewed long term complications of uncontrolled cholesterol - Recommend to continue atorvastatin  80 mg daily, ezetimibe  10 mg daily  - Recheck lipid panel at PCP appt on 10/12/23. Patient instructed to fast.   Follow Up Plan: PCP 10/12/23, PharmD telephone 11/16/23  Lorain Baseman, PharmD [facility] [phone]

## 2023-11-16 NOTE — Addendum Note (Signed)
 Addended by: Jalacia Mattila D on: 11/16/2023 12:46 PM   Modules accepted: Orders

## 2023-11-16 NOTE — Progress Notes (Signed)
 Attempted to contact patient for scheduled appointment for medication management. Left HIPAA compliant message for patient to return my call at their convenience.   Lorain Baseman, PharmD [facility] [phone]

## 2023-11-16 NOTE — Telephone Encounter (Signed)
 Copay card is saying pt has government insurance. Pharmacy tired to call but they are saying that pt has to call and update email address. I called pt and LVM for him to call back. Will need to have pt call [phone] to update email info and to see if he can get an override since he doesn't have government insurance.

## 2023-11-23 ENCOUNTER — Other Ambulatory Visit: Payer: Self-pay | Admitting: Nurse Practitioner

## 2023-11-23 DIAGNOSIS — E1169 Type 2 diabetes mellitus with other specified complication: Secondary | ICD-10-CM

## 2023-11-25 ENCOUNTER — Other Ambulatory Visit (HOSPITAL_COMMUNITY): Payer: Self-pay

## 2023-11-30 ENCOUNTER — Ambulatory Visit: Payer: Self-pay | Admitting: Nurse Practitioner

## 2023-11-30 ENCOUNTER — Other Ambulatory Visit: Payer: Self-pay | Admitting: Nurse Practitioner

## 2023-11-30 DIAGNOSIS — J3089 Other allergic rhinitis: Secondary | ICD-10-CM

## 2023-12-01 ENCOUNTER — Other Ambulatory Visit: Payer: Self-pay | Admitting: Nurse Practitioner

## 2023-12-01 DIAGNOSIS — K219 Gastro-esophageal reflux disease without esophagitis: Secondary | ICD-10-CM

## 2023-12-15 ENCOUNTER — Other Ambulatory Visit: Payer: Self-pay | Admitting: Nurse Practitioner

## 2023-12-17 ENCOUNTER — Other Ambulatory Visit: Payer: Self-pay | Admitting: Nurse Practitioner

## 2023-12-17 DIAGNOSIS — E889 Metabolic disorder, unspecified: Secondary | ICD-10-CM

## 2024-01-07 ENCOUNTER — Telehealth: Payer: Self-pay | Admitting: *Deleted

## 2024-01-07 NOTE — Progress Notes (Signed)
 Complex Care Management Care Guide Note  01/07/2024 Name: Scott Buchanan MRN: [id number] DOB: 1966-03-23  Scott Buchanan is a 58 y.o. year old male who is a primary care patient of [name], [name], FNP and is actively engaged with the care management team. I reached out to Alm FORBES Lunger by phone today to assist with re-scheduling  with the Pharmacist.  Follow up plan: Unsuccessful telephone outreach attempt made.   Harlene Satterfield  [facility] Guide  Direct Dial: [phone]  Fax [phone]

## 2024-01-11 ENCOUNTER — Encounter: Payer: Self-pay | Admitting: Nurse Practitioner

## 2024-01-11 ENCOUNTER — Ambulatory Visit: Admitting: Nurse Practitioner

## 2024-01-11 VITALS — BP 143/93 | HR 86 | Wt 329.0 lb

## 2024-01-11 DIAGNOSIS — I10 Essential (primary) hypertension: Secondary | ICD-10-CM | POA: Diagnosis not present

## 2024-01-11 DIAGNOSIS — Z794 Long term (current) use of insulin: Secondary | ICD-10-CM | POA: Diagnosis not present

## 2024-01-11 DIAGNOSIS — E889 Metabolic disorder, unspecified: Secondary | ICD-10-CM

## 2024-01-11 DIAGNOSIS — G63 Polyneuropathy in diseases classified elsewhere: Secondary | ICD-10-CM | POA: Insufficient documentation

## 2024-01-11 DIAGNOSIS — E1165 Type 2 diabetes mellitus with hyperglycemia: Secondary | ICD-10-CM | POA: Diagnosis not present

## 2024-01-11 DIAGNOSIS — E785 Hyperlipidemia, unspecified: Secondary | ICD-10-CM | POA: Diagnosis not present

## 2024-01-11 DIAGNOSIS — J302 Other seasonal allergic rhinitis: Secondary | ICD-10-CM

## 2024-01-11 DIAGNOSIS — Z9109 Other allergy status, other than to drugs and biological substances: Secondary | ICD-10-CM | POA: Insufficient documentation

## 2024-01-11 LAB — POCT GLYCOSYLATED HEMOGLOBIN (HGB A1C): Hemoglobin A1C: 7.8 % — AB (ref 4.0–5.6)

## 2024-01-11 MED ORDER — LEVOCETIRIZINE DIHYDROCHLORIDE 5 MG PO TABS: 5.0000 mg | ORAL_TABLET | Freq: Every evening | ORAL | 0 refills | Status: DC

## 2024-01-11 MED ORDER — ATORVASTATIN CALCIUM 80 MG PO TABS: 80.0000 mg | ORAL_TABLET | Freq: Every day | ORAL | 2 refills | Status: DC

## 2024-01-11 MED ORDER — CARVEDILOL 25 MG PO TABS: 25.0000 mg | ORAL_TABLET | Freq: Two times a day (BID) | ORAL | 3 refills | Status: AC

## 2024-01-11 MED ORDER — MONTELUKAST SODIUM 10 MG PO TABS: 10.0000 mg | ORAL_TABLET | Freq: Every day | ORAL | 0 refills | Status: AC

## 2024-01-11 MED ORDER — GABAPENTIN 300 MG PO CAPS
300.0000 mg | ORAL_CAPSULE | Freq: Every day | ORAL | 0 refills | Status: AC
Start: 2024-01-11 — End: ?

## 2024-01-11 MED ORDER — EZETIMIBE 10 MG PO TABS: 10.0000 mg | ORAL_TABLET | Freq: Every day | ORAL | 0 refills | Status: DC

## 2024-01-11 MED ORDER — AMLODIPINE BESYLATE 10 MG PO TABS: 10.0000 mg | ORAL_TABLET | Freq: Every day | ORAL | 2 refills | Status: AC

## 2024-01-11 MED ORDER — INSULIN GLARGINE 100 UNIT/ML SOLOSTAR PEN: 14.0000 [IU] | PEN_INJECTOR | Freq: Every day | SUBCUTANEOUS | 11 refills | Status: DC

## 2024-01-11 MED ORDER — OLMESARTAN MEDOXOMIL-HCTZ 40-25 MG PO TABS: 1.0000 | ORAL_TABLET | Freq: Every day | ORAL | 1 refills | Status: DC

## 2024-01-11 MED ORDER — FLUTICASONE PROPIONATE 50 MCG/ACT NA SUSP: 2.0000 | Freq: Every day | NASAL | 0 refills | Status: DC

## 2024-01-11 NOTE — Assessment & Plan Note (Signed)
 BP Readings from Last 3 Encounters:  01/11/24 (!) 143/93  10/12/23 133/75  10/07/23 (!) 142/82   Blood pressure elevated at 143/93 mmHg. Discussed risks of uncontrolled hypertension. - Discontinue single olmesartan  and hydrochlorothiazide  tablets. - Prescribe combination olmesartan /hydrochlorothiazide  40/25 mg.  Continue carvedilol  25 mg twice daily, amlodipine  10 mg daily - Advise monitoring blood pressure at home regularly.  Blood pressure goal is less than 130/80 Follow-up in 2 months Discussed DASH diet and dietary sodium restrictions Continue to increase dietary efforts and exercise.

## 2024-01-11 NOTE — Assessment & Plan Note (Addendum)
 Lab Results  Component Value Date   CHOL 139 10/26/2023   HDL 27 (L) 10/26/2023   LDLCALC 77 10/26/2023   LDLDIRECT 98 05/05/2023   TRIG 208 (H) 10/26/2023   CHOLHDL 5.1 (H) 10/26/2023    Hyperlipidemia Cholesterol levels not at goal despite atorvastatin  80 mg and ezetimibe  10 mg. Repatha  cost prohibitive, pharmacy working on getting co-pay card ,pharmacy had tried  reaching out to the patient to call pt call [phone] to update email info but were unable to reach him, the number was provided today, encouraged the patient to call the number to update his email information, and notify me when that has being done so pharmacy can proceed with getting the medication available

## 2024-01-11 NOTE — Assessment & Plan Note (Signed)
  Discussed potential for increased symptoms with changing seasons.  Advised to take medications daily as ordered - Refill fluticasone  nasal spray, levocetirizine, and montelukast .

## 2024-01-11 NOTE — Progress Notes (Signed)
 Established Patient Office Visit  Subjective:  Patient ID: Scott Buchanan, male    DOB: 27-Apr-1965  Age: 58 y.o. MRN: [id number]  CC:  Chief Complaint  Patient presents with   Diabetes    HPI    Discussed the use of AI scribe software for clinical note transcription with the patient, who gave verbal consent to proceed.  History of Present Illness Scott Buchanan is a 58 year old male  has a past medical history of Arthritis, Asthma, Diabetes mellitus without complication (HCC), Fatty liver (11/20/2019), Hyperlipidemia associated with type 2 diabetes mellitus (HCC) (05/14/2018), Hypertension, Morbid obesity (HCC) (07/27/2017), Primary localized osteoarthritis of right knee (03/02/2018), Seasonal allergies (11/09/2013), Severe persistent asthma without complication (05/16/2021), and Type 2 diabetes mellitus without complication, without long-term current use of insulin  (HCC) (05/12/2017).   who presents for medication management and follow-up.  He has seen an improvement in his A1c, currently at 7.8, down from 8.6 three months ago. He is on metformin  1000 mg twice daily, Lantus  10 units daily, and glipizide  10 mg twice daily with meals. His blood sugar ranges from the upper 120s to 160s, influenced by his diet.  He is concerned about the cost of Repatha  injections for cholesterol management, which he cannot afford. He manages high cholesterol with Zetia  10 mg daily and atorvastatin  80 mg daily. He is unsure about his current use of Zetia  due to changes in his medication routine.  He has hypertension and is on amlodipine  10 mg daily, carvedilol  25 mg twice daily, hydrochlorothiazide  25 mg daily and olmesartan  20 mg daily. He reports inconsistent home blood pressure monitoring, with today's reading at 143/93 mmHg.    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 He acknowledges that his diet could be improved, particularly his intake of sweets. He has been eating  berries as a snack and inquires about the suitability of grapes in his diet.  He uses Flonase  nasal spray, cetirizine, and montelukast  for allergies, and needs refills for these medications. He also mentions gabapentin , which he has not taken recently due to prescription issues.  Assessment and Plan Assessment & Plan     Past Medical History:  Diagnosis Date   Arthritis    Asthma    Diabetes mellitus without complication (HCC)    type 2   Fatty liver 11/20/2019   Hyperlipidemia associated with type 2 diabetes mellitus (HCC) 05/14/2018   Hypertension    Morbid obesity (HCC) 07/27/2017   Primary localized osteoarthritis of right knee 03/02/2018   Seasonal allergies 11/09/2013   Severe persistent asthma without complication 05/16/2021   Type 2 diabetes mellitus without complication, without long-term current use of insulin  (HCC) 05/12/2017    Past Surgical History:  Procedure Laterality Date   CYSTECTOMY     tailbone   ETHMOIDECTOMY Bilateral 04/07/2017   Procedure: BILATERAL ETHMOIDECTOMY AND SPHENOIDECTOMY;  Surgeon: Karis Clunes, MD;  Location: [facility];  Service: ENT;  Laterality: Bilateral;   KNEE ARTHROSCOPY W/ MENISCAL REPAIR     right and left knee one year apart   LEFT HEART CATH AND CORONARY ANGIOGRAPHY N/A 09/04/2020   Procedure: LEFT HEART CATH AND CORONARY ANGIOGRAPHY;  Surgeon: Swaziland, Peter M, MD;  Location: [facility];  Service: Cardiovascular;  Laterality: N/A;   PARTIAL KNEE ARTHROPLASTY Right 03/02/2018   Procedure: UNICOMPARTMENTAL KNEE;  Surgeon: Josefina Chew, MD;  Location: [facility];  Service: Orthopedics;  Laterality: Right;  with block  SINUS ENDO WITH FUSION Bilateral 04/07/2017   Procedure: BILATERAL FRONTAL RECESS SINUS EXPLORATION WITH SINUS FUSION NAVIGATION;  Surgeon: Karis Clunes, MD;  Location: [facility];  Service: ENT;  Laterality: Bilateral;   SINUS EXPLORATION     TONSILLECTOMY     TURBINATE REDUCTION Bilateral  04/07/2017   Procedure: BILATERAL TURBINATE REDUCTION;  Surgeon: Karis Clunes, MD;  Location: [facility];  Service: ENT;  Laterality: Bilateral;   unicompartmental right knee      Family History  Problem Relation Age of Onset   Hypertension Mother    Colon cancer Neg Hx    Colon polyps Neg Hx    Stomach cancer Neg Hx    Rectal cancer Neg Hx    Esophageal cancer Neg Hx    Inflammatory bowel disease Neg Hx    Liver disease Neg Hx    Pancreatic cancer Neg Hx     Social History   Socioeconomic History   Marital status: Single    Spouse name: Not on file   Number of children: 1   Years of education: Not on file   Highest education level: Bachelor's degree (e.g., BA, AB, BS)  Occupational History   Occupation: maintance   Tobacco Use   Smoking status: Former    Types: Cigarettes   Smokeless tobacco: Never   Tobacco comments:    quit 10 years ago  Vaping Use   Vaping status: Never Used  Substance and Sexual Activity   Alcohol use: No   Drug use: No   Sexual activity: Yes  Other Topics Concern   Not on file  Social History Narrative   Not on file   Social Drivers of Health   Financial Resource Strain: Medium Risk (10/08/2023)   Overall Financial Resource Strain (CARDIA)    Difficulty of Paying Living Expenses: Somewhat hard  Food Insecurity: No Food Insecurity (10/08/2023)   Hunger Vital Sign    Worried About Running Out of Food in the Last Year: Never true    Ran Out of Food in the Last Year: Never true  Transportation Needs: No Transportation Needs (10/08/2023)   PRAPARE - Administrator, Civil Service (Medical): No    Lack of Transportation (Non-Medical): No  Physical Activity: Insufficiently Active (10/08/2023)   Exercise Vital Sign    Days of Exercise per Week: 4 days    Minutes of Exercise per Session: 30 min  Stress: No Stress Concern Present (10/08/2023)   [employer] of Occupational Health - Occupational Stress Questionnaire     Feeling of Stress: Only a little  Social Connections: Socially Isolated (10/08/2023)   Social Connection and Isolation Panel    Frequency of Communication with Friends and Family: Once a week    Frequency of Social Gatherings with Friends and Family: Once a week    Attends Religious Services: Never    Database administrator or Organizations: No    Attends Engineer, structural: Not on file    Marital Status: Never married  Intimate Partner Violence: Not on file    Outpatient Medications Prior to Visit  Medication Sig Dispense Refill   acetaminophen  (TYLENOL ) 500 MG tablet Take 500 mg by mouth every 6 (six) hours as needed for headache, mild pain (pain score 1-3) or moderate pain (pain score 4-6).     albuterol  (VENTOLIN  HFA) 108 (90 Base) MCG/ACT inhaler Inhale 2 puffs into the lungs every 4 (four) hours as needed for wheezing or shortness of  breath. 18 g 3   Ascorbic Acid  (VITAMIN C ) 1000 MG tablet Take 2,000 mg by mouth daily.     aspirin  EC 81 MG tablet Take 81 mg by mouth daily.     cholecalciferol  (VITAMIN D ) 1000 units tablet Take 2,000 Units by mouth daily.     EPINEPHrine  0.3 mg/0.3 mL IJ SOAJ injection Inject 0.3 mg into the muscle as needed for anaphylaxis. 2 each 1   fenofibrate  (TRICOR ) 48 MG tablet Take 1 tablet (48 mg total) by mouth daily. 60 tablet 1   glipiZIDE  (GLUCOTROL  XL) 10 MG 24 hr tablet Take 1 tablet (10 mg total) by mouth 2 (two) times daily with a meal. 180 tablet 2   Insulin  Pen Needle 32G X 4 MM MISC Use to inject insulin  once daily as directed 100 each 3   metFORMIN  (GLUCOPHAGE ) 1000 MG tablet Take 1 tablet (1,000 mg total) by mouth 2 (two) times daily with a meal. 180 tablet 1   omeprazole  (PRILOSEC) 40 MG capsule Take 1 capsule by mouth once daily 90 capsule 0   oxybutynin  (DITROPAN -XL) 10 MG 24 hr tablet Take 1 tablet by mouth once daily 30 tablet 0   sodium chloride  (OCEAN) 0.65 % SOLN nasal spray Place 1 spray into both nostrils as needed.      tamsulosin  (FLOMAX ) 0.4 MG CAPS capsule Take 1 capsule (0.4 mg total) by mouth daily. 90 capsule 3   amLODipine  (NORVASC ) 10 MG tablet Take 1 tablet (10 mg total) by mouth daily. 90 tablet 2   atorvastatin  (LIPITOR) 80 MG tablet Take 1 tablet (80 mg total) by mouth daily. 90 tablet 1   carvedilol  (COREG ) 25 MG tablet Take 1 tablet (25 mg total) by mouth 2 (two) times daily with a meal. 60 tablet 3   ezetimibe  (ZETIA ) 10 MG tablet Take 1 tablet by mouth once daily 90 tablet 0   fluticasone  (FLONASE ) 50 MCG/ACT nasal spray Use 2 spray(s) in each nostril once daily 16 g 0   gabapentin  (NEURONTIN ) 300 MG capsule Take 1 capsule by mouth at bedtime 90 capsule 0   hydrochlorothiazide  (HYDRODIURIL ) 25 MG tablet Take 1 tablet (25 mg total) by mouth daily. 90 tablet 1   insulin  glargine (LANTUS ) 100 UNIT/ML Solostar Pen Inject 10 Units into the skin daily. 15 mL 11   levocetirizine (XYZAL ) 5 MG tablet TAKE 1 TABLET BY MOUTH ONCE DAILY IN THE EVENING 90 tablet 0   montelukast  (SINGULAIR ) 10 MG tablet TAKE 1 TABLET BY MOUTH AT BEDTIME 90 tablet 0   olmesartan  (BENICAR ) 20 MG tablet Take 1 tablet (20 mg total) by mouth daily. 90 tablet 3   Evolocumab  (REPATHA  SURECLICK) 140 MG/ML SOAJ Inject 140 mg into the skin every 14 (fourteen) days. (Patient not taking: Reported on 01/11/2024) 2 mL 11   ofloxacin  (FLOXIN ) 0.3 % OTIC solution Instill 10 drops into left ear once daily for 7 days (Patient not taking: Reported on 01/11/2024) 5 mL 0   empagliflozin  (JARDIANCE ) 10 MG TABS tablet Take 1 tablet (10 mg total) by mouth daily before breakfast. (Patient not taking: Reported on 01/11/2024) 30 tablet 1   No facility-administered medications prior to visit.    No Known Allergies  ROS Review of Systems  Constitutional:  Negative for appetite change, chills, fatigue and fever.  HENT:  Positive for rhinorrhea and sneezing.   Respiratory:  Negative for cough, shortness of breath and wheezing.   Cardiovascular:  Negative  for chest pain, palpitations and leg swelling.  Gastrointestinal:  Negative for abdominal pain, constipation, nausea and vomiting.  Genitourinary:  Negative for difficulty urinating, dysuria, flank pain and frequency.  Musculoskeletal:  Negative for arthralgias, back pain, joint swelling and myalgias.  Skin:  Negative for color change, pallor, rash and wound.  Neurological:  Negative for facial asymmetry, weakness, numbness and headaches.  Psychiatric/Behavioral:  Negative for behavioral problems, confusion, self-injury and suicidal ideas.       Objective:    Physical Exam Vitals and nursing note reviewed.  Constitutional:      General: He is not in acute distress.    Appearance: Normal appearance. He is obese. He is not ill-appearing, toxic-appearing or diaphoretic.  Eyes:     General: No scleral icterus.       Right eye: No discharge.        Left eye: No discharge.     Extraocular Movements: Extraocular movements intact.     Conjunctiva/sclera: Conjunctivae normal.  Cardiovascular:     Rate and Rhythm: Normal rate and regular rhythm.     Pulses: Normal pulses.     Heart sounds: Normal heart sounds. No murmur heard.    No friction rub. No gallop.  Pulmonary:     Effort: Pulmonary effort is normal. No respiratory distress.     Breath sounds: Normal breath sounds. No stridor. No wheezing, rhonchi or rales.  Chest:     Chest wall: No tenderness.  Abdominal:     General: There is no distension.     Palpations: Abdomen is soft.     Tenderness: There is no abdominal tenderness. There is no right CVA tenderness, left CVA tenderness or guarding.  Musculoskeletal:        General: No swelling, tenderness, deformity or signs of injury.     Right lower leg: No edema.     Left lower leg: No edema.  Skin:    General: Skin is warm and dry.     Capillary Refill: Capillary refill takes less than 2 seconds.     Coloration: Skin is not jaundiced or pale.     Findings: No bruising, erythema  or lesion.  Neurological:     Mental Status: He is alert and oriented to person, place, and time.     Motor: No weakness.     Gait: Gait normal.  Psychiatric:        Mood and Affect: Mood normal.        Behavior: Behavior normal.        Thought Content: Thought content normal.        Judgment: Judgment normal.     BP (!) 143/93   Pulse 86   Wt (!) 329 lb (149.2 kg)   SpO2 97%   BMI 40.05 kg/m  Wt Readings from Last 3 Encounters:  01/11/24 (!) 329 lb (149.2 kg)  10/12/23 (!) 324 lb (147 kg)  10/07/23 (!) 324 lb 9.6 oz (147.2 kg)    Lab Results  Component Value Date   TSH 1.270 03/13/2020   Lab Results  Component Value Date   WBC 7.7 03/18/2021   HGB 14.3 03/18/2021   HCT 42.7 03/18/2021   MCV 85 03/18/2021   PLT 397 03/18/2021   Lab Results  Component Value Date   NA 138 10/26/2023   K 4.5 10/26/2023   CO2 21 10/26/2023   GLUCOSE 199 (H) 10/26/2023   BUN 25 (H) 10/26/2023   CREATININE 1.17 10/26/2023   BILITOT 0.4 12/30/2022   ALKPHOS 57 12/30/2022  AST 28 12/30/2022   ALT 51 (H) 12/30/2022   PROT 7.0 12/30/2022   ALBUMIN 4.5 12/30/2022   CALCIUM  10.4 (H) 10/26/2023   ANIONGAP 13 06/11/2020   EGFR 73 10/26/2023   Lab Results  Component Value Date   CHOL 139 10/26/2023   Lab Results  Component Value Date   HDL 27 (L) 10/26/2023   Lab Results  Component Value Date   LDLCALC 77 10/26/2023   Lab Results  Component Value Date   TRIG 208 (H) 10/26/2023   Lab Results  Component Value Date   CHOLHDL 5.1 (H) 10/26/2023   Lab Results  Component Value Date   HGBA1C 7.8 (A) 01/11/2024      Assessment & Plan:   Problem List Items Addressed This Visit       Cardiovascular and Mediastinum   Essential hypertension   BP Readings from Last 3 Encounters:  01/11/24 (!) 143/93  10/12/23 133/75  10/07/23 (!) 142/82   Blood pressure elevated at 143/93 mmHg. Discussed risks of uncontrolled hypertension. - Discontinue single olmesartan  and  hydrochlorothiazide  tablets. - Prescribe combination olmesartan /hydrochlorothiazide  40/25 mg.  Continue carvedilol  25 mg twice daily, amlodipine  10 mg daily - Advise monitoring blood pressure at home regularly.  Blood pressure goal is less than 130/80 Follow-up in 2 months Discussed DASH diet and dietary sodium restrictions Continue to increase dietary efforts and exercise.         Relevant Medications   olmesartan -hydrochlorothiazide  (BENICAR  HCT) 40-25 MG tablet   amLODipine  (NORVASC ) 10 MG tablet   carvedilol  (COREG ) 25 MG tablet   ezetimibe  (ZETIA ) 10 MG tablet   atorvastatin  (LIPITOR) 80 MG tablet   Other Relevant Orders   Basic Metabolic Panel     Endocrine   Uncontrolled type 2 diabetes mellitus with hyperglycemia, with long-term current use of insulin  (HCC) - Primary   Lab Results  Component Value Date   HGBA1C 7.8 (A) 01/11/2024    A1c improved from 8.6% to 7.8%, but not at target. Blood sugar levels influenced by diet. - Increase Lantus  to 14 units daily.  Continue glipizide  10 mg twice daily, metformin  1000 mg twice daily - Counseled on low-carb diet provide educational resources on diet and nutrition for diabetes. - Offer referral for a diabetes nutrition class refresher but patient declined due to his work schedule       Relevant Medications   olmesartan -hydrochlorothiazide  (BENICAR  HCT) 40-25 MG tablet   insulin  glargine (LANTUS ) 100 UNIT/ML Solostar Pen   atorvastatin  (LIPITOR) 80 MG tablet   Other Relevant Orders   POCT glycosylated hemoglobin (Hb A1C) (Completed)   Basic Metabolic Panel   Lipid panel     Nervous and Auditory   Peripheral neuropathy due to metabolic disorder (HCC)   Relevant Medications   gabapentin  (NEURONTIN ) 300 MG capsule     Other   Dyslipidemia, goal LDL below 70   Lab Results  Component Value Date   CHOL 139 10/26/2023   HDL 27 (L) 10/26/2023   LDLCALC 77 10/26/2023   LDLDIRECT 98 05/05/2023   TRIG 208 (H) 10/26/2023    CHOLHDL 5.1 (H) 10/26/2023    Hyperlipidemia Cholesterol levels not at goal despite atorvastatin  80 mg and ezetimibe  10 mg. Repatha  cost prohibitive, pharmacy working on getting co-pay card ,pharmacy had tried  reaching out to the patient to call pt call [phone] to update email info but were unable to reach him, the number was provided today, encouraged the patient to call the number to update his email  information, and notify me when that has being done so pharmacy can proceed with getting the medication available           Relevant Medications   olmesartan -hydrochlorothiazide  (BENICAR  HCT) 40-25 MG tablet   amLODipine  (NORVASC ) 10 MG tablet   carvedilol  (COREG ) 25 MG tablet   ezetimibe  (ZETIA ) 10 MG tablet   atorvastatin  (LIPITOR) 80 MG tablet   Obesity, morbid, BMI 40.0-49.9 (HCC)   Wt Readings from Last 3 Encounters:  01/11/24 (!) 329 lb (149.2 kg)  10/12/23 (!) 324 lb (147 kg)  10/07/23 (!) 324 lb 9.6 oz (147.2 kg)   Body mass index is 40.05 kg/m.   Weight loss emphasized for diabetes and hypertension control. Dietary habits need improvement. - Encourage regular, moderate to rigorous exercise for 30 minutes, 5 days a week. - Advise on low carbohydrate, heart-healthy diet with low salt and low fat.      Relevant Medications   insulin  glargine (LANTUS ) 100 UNIT/ML Solostar Pen   Seasonal allergies    Discussed potential for increased symptoms with changing seasons.  Advised to take medications daily as ordered - Refill fluticasone  nasal spray, levocetirizine, and montelukast .      Relevant Medications   fluticasone  (FLONASE ) 50 MCG/ACT nasal spray   levocetirizine (XYZAL ) 5 MG tablet   montelukast  (SINGULAIR ) 10 MG tablet    Meds ordered this encounter  Medications   fluticasone  (FLONASE ) 50 MCG/ACT nasal spray    Sig: Place 2 sprays into both nostrils daily.    Dispense:  16 g    Refill:  0   levocetirizine (XYZAL ) 5 MG tablet    Sig: Take 1 tablet (5 mg  total) by mouth every evening.    Dispense:  90 tablet    Refill:  0   montelukast  (SINGULAIR ) 10 MG tablet    Sig: Take 1 tablet (10 mg total) by mouth at bedtime.    Dispense:  90 tablet    Refill:  0   gabapentin  (NEURONTIN ) 300 MG capsule    Sig: Take 1 capsule (300 mg total) by mouth at bedtime.    Dispense:  90 capsule    Refill:  0   olmesartan -hydrochlorothiazide  (BENICAR  HCT) 40-25 MG tablet    Sig: Take 1 tablet by mouth daily.    Dispense:  90 tablet    Refill:  1   insulin  glargine (LANTUS ) 100 UNIT/ML Solostar Pen    Sig: Inject 14 Units into the skin daily.    Dispense:  15 mL    Refill:  11   amLODipine  (NORVASC ) 10 MG tablet    Sig: Take 1 tablet (10 mg total) by mouth daily.    Dispense:  90 tablet    Refill:  2   carvedilol  (COREG ) 25 MG tablet    Sig: Take 1 tablet (25 mg total) by mouth 2 (two) times daily with a meal.    Dispense:  180 tablet    Refill:  3   ezetimibe  (ZETIA ) 10 MG tablet    Sig: Take 1 tablet (10 mg total) by mouth daily.    Dispense:  90 tablet    Refill:  0   atorvastatin  (LIPITOR) 80 MG tablet    Sig: Take 1 tablet (80 mg total) by mouth daily.    Dispense:  90 tablet    Refill:  2    Follow-up: Return in about 2 months (around 03/12/2024) for HYPERLIPIDEMIA, HTN.    Jamesrobert Ohanesian R Jaeden Westbay, FNP

## 2024-01-11 NOTE — Patient Instructions (Signed)
 Around 3 times per week, check your blood pressure 2 times per day. once in the morning and once in the evening. The readings should be at least one minute apart. Write down these values and bring them to your next nurse visit/appointment.  When you check your BP, make sure you have been doing something calm/relaxing 5 minutes prior to checking. Both feet should be flat on the floor and you should be sitting. Use your left arm and make sure it is in a relaxed position (on a table), and that the cuff is at the approximate level/height of your heart.  Blood pressure goal is less than 130/80   Goal for fasting blood sugar ranges from 80 to 120 and 2 hours after any meal or at bedtime should be between 130 to 170.    1. Allergy to environmental factors  - fluticasone  (FLONASE ) 50 MCG/ACT nasal spray; Place 2 sprays into both nostrils daily.  Dispense: 16 g; Refill: 0 - levocetirizine (XYZAL ) 5 MG tablet; Take 1 tablet (5 mg total) by mouth every evening.  Dispense: 90 tablet; Refill: 0 - montelukast  (SINGULAIR ) 10 MG tablet; Take 1 tablet (10 mg total) by mouth at bedtime.  Dispense: 90 tablet; Refill: 0  2. Peripheral neuropathy due to metabolic disorder (HCC)  - gabapentin  (NEURONTIN ) 300 MG capsule; Take 1 capsule (300 mg total) by mouth at bedtime.  Dispense: 90 capsule; Refill: 0  3. Type 2 diabetes mellitus without complication, without long-term current use of insulin  (HCC) (Primary)  - POCT glycosylated hemoglobin (Hb A1C) - insulin  glargine (LANTUS ) 100 UNIT/ML Solostar Pen; Inject 14 Units into the skin daily.  Dispense: 15 mL; Refill: 11 - Basic Metabolic Panel - Lipid panel  4. Essential hypertension  - olmesartan -hydrochlorothiazide  (BENICAR  HCT) 40-25 MG tablet; Take 1 tablet by mouth daily.  Dispense: 90 tablet; Refill: 1 - Basic Metabolic Panel - amLODipine  (NORVASC ) 10 MG tablet; Take 1 tablet (10 mg total) by mouth daily.  Dispense: 90 tablet; Refill: 2 - carvedilol   (COREG ) 25 MG tablet; Take 1 tablet (25 mg total) by mouth 2 (two) times daily with a meal.  Dispense: 180 tablet; Refill: 3  5. Dyslipidemia, goal LDL below 70   - ezetimibe  (ZETIA ) 10 MG tablet; Take 1 tablet (10 mg total) by mouth daily.  Dispense: 90 tablet; Refill: 0 - atorvastatin  (LIPITOR) 80 MG tablet; Take 1 tablet (80 mg total) by mouth daily.  Dispense: 90 tablet; Refill: 2  It is important that you exercise regularly at least 30 minutes 5 times a week as tolerated  Think about what you will eat, plan ahead. Choose  clean, green, fresh or frozen over canned, processed or packaged foods which are more sugary, salty and fatty. 70 to 75% of food eaten should be vegetables and fruit. Three meals at set times with snacks allowed between meals, but they must be fruit or vegetables. Aim to eat over a 12 hour period , example 7 am to 7 pm, and STOP after  your last meal of the day. Drink water ,generally about 64 ounces per day, no other drink is as healthy. Fruit juice is best enjoyed in a healthy way, by EATING the fruit.  Thanks for choosing [facility] we consider it a privelige to serve you.

## 2024-01-11 NOTE — Assessment & Plan Note (Signed)
 Wt Readings from Last 3 Encounters:  01/11/24 (!) 329 lb (149.2 kg)  10/12/23 (!) 324 lb (147 kg)  10/07/23 (!) 324 lb 9.6 oz (147.2 kg)   Body mass index is 40.05 kg/m.   Weight loss emphasized for diabetes and hypertension control. Dietary habits need improvement. - Encourage regular, moderate to rigorous exercise for 30 minutes, 5 days a week. - Advise on low carbohydrate, heart-healthy diet with low salt and low fat.

## 2024-01-11 NOTE — Assessment & Plan Note (Signed)
 Lab Results  Component Value Date   HGBA1C 7.8 (A) 01/11/2024    A1c improved from 8.6% to 7.8%, but not at target. Blood sugar levels influenced by diet. - Increase Lantus  to 14 units daily.  Continue glipizide  10 mg twice daily, metformin  1000 mg twice daily - Counseled on low-carb diet provide educational resources on diet and nutrition for diabetes. - Offer referral for a diabetes nutrition class refresher but patient declined due to his work schedule

## 2024-01-12 ENCOUNTER — Other Ambulatory Visit: Payer: Self-pay | Admitting: Nurse Practitioner

## 2024-01-12 ENCOUNTER — Ambulatory Visit: Payer: Self-pay | Admitting: Nurse Practitioner

## 2024-01-12 DIAGNOSIS — E785 Hyperlipidemia, unspecified: Secondary | ICD-10-CM

## 2024-01-12 LAB — LIPID PANEL
Chol/HDL Ratio: 5.9 ratio — ABNORMAL HIGH (ref 0.0–5.0)
Cholesterol, Total: 165 mg/dL (ref 100–199)
HDL: 28 mg/dL — ABNORMAL LOW (ref >39)
LDL Chol Calc (NIH): 104 mg/dL — ABNORMAL HIGH (ref 0–99)
Triglycerides: 188 mg/dL — ABNORMAL HIGH (ref 0–149)
VLDL Cholesterol Cal: 33 mg/dL (ref 5–40)

## 2024-01-12 LAB — BASIC METABOLIC PANEL WITH GFR
BUN/Creatinine Ratio: 16 (ref 9–20)
BUN: 15 mg/dL (ref 6–24)
CO2: 21 mmol/L (ref 20–29)
Calcium: 10.2 mg/dL (ref 8.7–10.2)
Chloride: 96 mmol/L (ref 96–106)
Creatinine, Ser: 0.92 mg/dL (ref 0.76–1.27)
Glucose: 100 mg/dL — ABNORMAL HIGH (ref 70–99)
Potassium: 4 mmol/L (ref 3.5–5.2)
Sodium: 141 mmol/L (ref 134–144)
eGFR: 98 mL/min/1.73 (ref >59)

## 2024-01-13 NOTE — Progress Notes (Signed)
 Complex Care Management Care Guide Note  01/13/2024 Name: CEBASTIAN NEIS MRN: [id number] DOB: [birth year]  AADARSH COZORT is a 58 y.o. year old male who is a primary care patient of [name], [name], FNP and is actively engaged with the care management team. I reached out to Alm FORBES Lunger by phone today to assist with re-scheduling  with the Pharmacist.  Follow up plan: Unsuccessful telephone outreach attempt made. No further outreach attempts will be made at this time. We have been unable to contact the patient to reschedule for complex care management services.   Harlene Satterfield  [facility]  [facility], [facility] Guide  Direct Dial: [phone]  Fax [phone]

## 2024-01-13 NOTE — Progress Notes (Signed)
 Complex Care Management Care Guide Note  01/13/2024 Name: [name] MRN: [id number] DOB: January 30, 1966  [name] is a 58 y.o. year old male who is a primary care patient of [name], [name], FNP and is actively engaged with the care management team. I reached out to Alm FORBES Lunger by phone today to assist with re-scheduling  with the Pharmacist.  Follow up plan: Face to Face appointment with complex care management team member scheduled for:  03/08/24  Harlene Satterfield  [facility]  [facility], [facility] Guide  Direct Dial: [phone]  Fax [phone]

## 2024-01-17 ENCOUNTER — Other Ambulatory Visit: Payer: Self-pay | Admitting: Nurse Practitioner

## 2024-01-17 DIAGNOSIS — E1169 Type 2 diabetes mellitus with other specified complication: Secondary | ICD-10-CM

## 2024-02-15 ENCOUNTER — Other Ambulatory Visit: Payer: Self-pay | Admitting: Nurse Practitioner

## 2024-02-15 DIAGNOSIS — E669 Obesity, unspecified: Secondary | ICD-10-CM

## 2024-02-17 ENCOUNTER — Other Ambulatory Visit: Payer: Self-pay | Admitting: Nurse Practitioner

## 2024-02-17 DIAGNOSIS — J302 Other seasonal allergic rhinitis: Secondary | ICD-10-CM

## 2024-03-06 ENCOUNTER — Other Ambulatory Visit: Payer: Self-pay | Admitting: Nurse Practitioner

## 2024-03-06 DIAGNOSIS — K219 Gastro-esophageal reflux disease without esophagitis: Secondary | ICD-10-CM

## 2024-03-08 ENCOUNTER — Ambulatory Visit: Payer: Self-pay

## 2024-03-14 ENCOUNTER — Ambulatory Visit: Payer: Self-pay | Admitting: Nurse Practitioner

## 2024-03-14 ENCOUNTER — Other Ambulatory Visit: Payer: Self-pay | Admitting: Nurse Practitioner

## 2024-03-14 DIAGNOSIS — E781 Pure hyperglyceridemia: Secondary | ICD-10-CM

## 2024-03-19 ENCOUNTER — Other Ambulatory Visit: Payer: Self-pay | Admitting: Nurse Practitioner

## 2024-03-19 DIAGNOSIS — E669 Obesity, unspecified: Secondary | ICD-10-CM

## 2024-04-05 ENCOUNTER — Ambulatory Visit: Admitting: Nurse Practitioner

## 2024-04-16 ENCOUNTER — Other Ambulatory Visit: Payer: Self-pay | Admitting: Nurse Practitioner

## 2024-04-16 DIAGNOSIS — J302 Other seasonal allergic rhinitis: Secondary | ICD-10-CM

## 2024-04-17 ENCOUNTER — Other Ambulatory Visit: Payer: Self-pay | Admitting: Nurse Practitioner

## 2024-04-17 DIAGNOSIS — E669 Obesity, unspecified: Secondary | ICD-10-CM

## 2024-04-19 ENCOUNTER — Other Ambulatory Visit: Payer: Self-pay | Admitting: Nurse Practitioner

## 2024-04-19 DIAGNOSIS — R351 Nocturia: Secondary | ICD-10-CM

## 2024-04-19 NOTE — Telephone Encounter (Signed)
 tamsulosin  (FLOMAX ) 0.4 MG CAPS capsule [Pharmacy Med Name: Tamsulosin  HCl 0.4 MG Oral Capsule]

## 2024-04-26 ENCOUNTER — Telehealth: Payer: Self-pay

## 2024-04-26 ENCOUNTER — Ambulatory Visit (INDEPENDENT_AMBULATORY_CARE_PROVIDER_SITE_OTHER): Admitting: Nurse Practitioner

## 2024-04-26 ENCOUNTER — Encounter: Payer: Self-pay | Admitting: Nurse Practitioner

## 2024-04-26 ENCOUNTER — Ambulatory Visit (INDEPENDENT_AMBULATORY_CARE_PROVIDER_SITE_OTHER)

## 2024-04-26 ENCOUNTER — Other Ambulatory Visit: Payer: Self-pay

## 2024-04-26 ENCOUNTER — Other Ambulatory Visit (HOSPITAL_COMMUNITY): Payer: Self-pay

## 2024-04-26 VITALS — BP 114/78 | HR 81

## 2024-04-26 VITALS — BP 114/78 | HR 85 | Temp 97.8°F | Wt 329.0 lb

## 2024-04-26 DIAGNOSIS — E1165 Type 2 diabetes mellitus with hyperglycemia: Secondary | ICD-10-CM

## 2024-04-26 DIAGNOSIS — I10 Essential (primary) hypertension: Secondary | ICD-10-CM | POA: Diagnosis not present

## 2024-04-26 DIAGNOSIS — Z794 Long term (current) use of insulin: Secondary | ICD-10-CM

## 2024-04-26 DIAGNOSIS — E785 Hyperlipidemia, unspecified: Secondary | ICD-10-CM

## 2024-04-26 DIAGNOSIS — J302 Other seasonal allergic rhinitis: Secondary | ICD-10-CM | POA: Diagnosis not present

## 2024-04-26 DIAGNOSIS — J0111 Acute recurrent frontal sinusitis: Secondary | ICD-10-CM | POA: Insufficient documentation

## 2024-04-26 DIAGNOSIS — Z0000 Encounter for general adult medical examination without abnormal findings: Secondary | ICD-10-CM | POA: Diagnosis not present

## 2024-04-26 MED ORDER — AMOXICILLIN-POT CLAVULANATE 875-125 MG PO TABS: 1.0000 | ORAL_TABLET | Freq: Two times a day (BID) | ORAL | 0 refills | Status: DC

## 2024-04-26 MED ORDER — ATORVASTATIN CALCIUM 80 MG PO TABS: 80.0000 mg | ORAL_TABLET | Freq: Every day | ORAL | 2 refills | Status: DC

## 2024-04-26 MED ORDER — EZETIMIBE 10 MG PO TABS: 10.0000 mg | ORAL_TABLET | Freq: Every day | ORAL | 1 refills | Status: AC

## 2024-04-26 NOTE — Assessment & Plan Note (Signed)
 General health maintenance Discussion of shingles vaccine and eye examination. - Encouraged shingles vaccine at pharmacy. - Advised scheduling an eye examination within the next couple of months.

## 2024-04-26 NOTE — Patient Instructions (Signed)
 1. Acute recurrent frontal sinusitis (Primary) - amoxicillin -clavulanate (AUGMENTIN ) 875-125 MG tablet; Take 1 tablet by mouth 2 (two) times daily.  Dispense: 14 tablet; Refill: 0 Please take Mucinex as needed for cough Continue singular 10 mg at bedtime, Flonase  nasal spray 2 spray into both nostrils daily, saline nasal spray as needed Drink at least 64 ounces of water  daily to maintain hydration    It is important that you exercise regularly at least 30 minutes 5 times a week as tolerated  Think about what you will eat, plan ahead. Choose  clean, green, fresh or frozen over canned, processed or packaged foods which are more sugary, salty and fatty. 70 to 75% of food eaten should be vegetables and fruit. Three meals at set times with snacks allowed between meals, but they must be fruit or vegetables. Aim to eat over a 12 hour period , example 7 am to 7 pm, and STOP after  your last meal of the day. Drink water ,generally about 64 ounces per day, no other drink is as healthy. Fruit juice is best enjoyed in a healthy way, by EATING the fruit.  Thanks for choosing [facility] we consider it a privelige to serve you.

## 2024-04-26 NOTE — Assessment & Plan Note (Signed)
 Wt Readings from Last 3 Encounters:  04/26/24 (!) 329 lb (149.2 kg)  01/11/24 (!) 329 lb (149.2 kg)  10/12/23 (!) 324 lb (147 kg)   Body mass index is 40.05 kg/m.   Weight management discussed for overall health improvement. - Encouraged dietary modifications and increased physical activity.

## 2024-04-26 NOTE — Assessment & Plan Note (Signed)
 Lab Results  Component Value Date   HGBA1C 7.8 (A) 01/11/2024   Fasting blood glucose slightly elevated. Uncertainty about atorvastatin  adherence. - Continue metformin  1000 mg twice daily, Lantus  14 units daily, and glipizide  10 mg twice daily. - Encouraged low-carb diet, avoiding sweets, sugar, soda, and flour-based foods. - Advised staying active, at least 30 minutes, 5 days a week. - Instructed to confirm atorvastatin  adherence and notify nurse via MyChart. Appreciate collaboration with the clinical pharmacist

## 2024-04-26 NOTE — Assessment & Plan Note (Addendum)
 Lipid panel not checked due to uncertainty about atorvastatin  adherence. - Encouraged adherence to atorvastatin  80 mg daily, Zetia  10 mg daily, and fenofibrate  48 mg daily. Did not order lipid panel today since it appears that the patient has not been taking atorvastatin  consistently based on his refill history - Advised dietary modifications to reduce fatty and fried foods.

## 2024-04-26 NOTE — Assessment & Plan Note (Signed)
 BP Readings from Last 3 Encounters:  04/26/24 114/78  04/26/24 114/78  01/11/24 (!) 143/93   Blood pressure well-controlled with current medication regimen. - Continue amlodipine  10 mg daily, carvedilol  25 mg twice daily, and olmesartan /hydrochlorothiazide  40/25 mg daily. - Encouraged heart-healthy diet with low salt and low fat. - Advised staying active, at least 30 minutes, 5 days a week.

## 2024-04-26 NOTE — Assessment & Plan Note (Addendum)
 possible sinusitis Augmentin  prescribed - Encouraged consistent use of Flonase .  Continue Singulair  10 mg tablet, xyzal  5mg  daily - Recommended saline nasal rinses in the morning. - Advised use of Mucinex DM as needed for cough.

## 2024-04-26 NOTE — Telephone Encounter (Signed)
 Pharmacy Patient Advocate Encounter   Received notification from CoverMyMeds that prior authorization for REPATHA  is required/requested.   Insurance verification completed.   The patient is insured through [facility]   Per test claim: PA required; PA submitted to above mentioned insurance via CoverMyMeds Key/confirmation #/EOC [id number] Status is pending

## 2024-04-26 NOTE — Progress Notes (Signed)
 "  Acute Office Visit  Subjective:     Patient ID: Scott Buchanan, male    DOB: [birth year], 59 y.o.   MRN: [id number]  Chief Complaint  Patient presents with   Nasal Congestion   Cough    HPI   Discussed the use of AI scribe software for clinical note transcription with the patient, who gave verbal consent to proceed.  History of Present Illness Scott Buchanan is a 59 year old male  has a past medical history of Arthritis, Asthma, Diabetes mellitus without complication (HCC), Fatty liver (11/20/2019), Hyperlipidemia associated with type 2 diabetes mellitus (HCC) (05/14/2018), Hypertension, Morbid obesity (HCC) (07/27/2017), Primary localized osteoarthritis of right knee (03/02/2018), Seasonal allergies (11/09/2013), Severe persistent asthma without complication (HCC) (05/16/2021), and Type 2 diabetes mellitus without complication, without long-term current use of insulin  (HCC) (05/12/2017).   who presents with a persistent cough and congestion.  He has been experiencing a persistent cough and congestion since December 20th, which began during a trip to [place] . The onset was described as a 'little itch,' and he notes that some of his grandchildren were also ill during his visit. Symptoms have persisted since his return approximately a week ago, including sinus pressure and greenish nasal drainage. No fever or malaise over the past week. He has been using cough drops for relief but has not taken Mucinex recently.  He has a history of allergies and sinus infections and is currently taking allergy medications, including Flonase  and Singulair  (montelukast ) at bedtime., xyzal  5mg  daily However, he has not been using Flonase  consistently due to congestion and frequent nose blowing.  He is on multiple medications, including atorvastatin  80 mg daily, Zetia  10 mg, amlodipine  10 mg daily, carvedilol  25 mg twice a day, olmesartan  hydrochlorothiazide  40-25 mg daily, metformin  1000 mg twice a day, Lantus   14 units, and glipizide  10 mg twice daily. He reports fasting blood sugars ranging from 130 to 140 mg/dL over the past few weeks.  He mentions difficulty maintaining a healthy diet while traveling and spending time with his grandchildren, which has impacted his ability to avoid fatty and fried foods. 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    Assessment & Plan      Review of Systems  Constitutional:  Negative for appetite change, chills, fatigue and fever.  HENT:  Positive for congestion, rhinorrhea, sinus pressure and sneezing. Negative for postnasal drip.   Respiratory:  Positive for cough. Negative for shortness of breath and wheezing.   Cardiovascular:  Negative for chest pain, palpitations and leg swelling.  Gastrointestinal:  Negative for abdominal pain, constipation, nausea and vomiting.  Genitourinary:  Negative for difficulty urinating, dysuria, flank pain and frequency.  Musculoskeletal:  Negative for arthralgias, back pain, joint swelling and myalgias.  Skin:  Negative for color change, pallor, rash and wound.  Neurological:  Negative for dizziness, facial asymmetry, weakness, numbness and headaches.  Psychiatric/Behavioral:  Negative for behavioral problems, confusion, self-injury and suicidal ideas.         Objective:    BP 114/78   Pulse 85   Temp 97.8 F (36.6 C)   Wt (!) 329 lb (149.2 kg)   SpO2 98%   BMI 40.05 kg/m    Physical Exam Vitals and nursing note reviewed.  Constitutional:      General: He is not in acute distress.    Appearance: Normal appearance. He is obese. He is not ill-appearing, toxic-appearing or diaphoretic.  HENT:  Right Ear: Tympanic membrane, ear canal and external ear normal. There is no impacted cerumen.     Left Ear: Tympanic membrane, ear canal and external ear normal. There is no impacted cerumen.     Nose: No congestion or rhinorrhea.     Mouth/Throat:     Mouth: Mucous  membranes are moist.     Pharynx: Oropharynx is clear. No oropharyngeal exudate or posterior oropharyngeal erythema.  Eyes:     General: No scleral icterus.       Right eye: No discharge.        Left eye: No discharge.     Extraocular Movements: Extraocular movements intact.     Conjunctiva/sclera: Conjunctivae normal.  Cardiovascular:     Rate and Rhythm: Normal rate and regular rhythm.     Pulses: Normal pulses.     Heart sounds: Normal heart sounds. No murmur heard.    No friction rub. No gallop.  Pulmonary:     Effort: Pulmonary effort is normal. No respiratory distress.     Breath sounds: Normal breath sounds. No stridor. No wheezing, rhonchi or rales.  Chest:     Chest wall: No tenderness.  Abdominal:     General: There is no distension.     Palpations: Abdomen is soft.     Tenderness: There is no abdominal tenderness. There is no right CVA tenderness, left CVA tenderness or guarding.  Musculoskeletal:        General: No swelling, tenderness, deformity or signs of injury.     Right lower leg: No edema.     Left lower leg: No edema.  Skin:    General: Skin is warm and dry.     Capillary Refill: Capillary refill takes less than 2 seconds.     Coloration: Skin is not jaundiced or pale.     Findings: No bruising, erythema or lesion.  Neurological:     Mental Status: He is alert and oriented to person, place, and time.     Motor: No weakness.     Gait: Gait normal.  Psychiatric:        Mood and Affect: Mood normal.        Behavior: Behavior normal.        Thought Content: Thought content normal.        Judgment: Judgment normal.     Results for orders placed or performed in visit on 04/26/24  HM DIABETES EYE EXAM  Result Value Ref Range   HM Diabetic Eye Exam No Retinopathy No Retinopathy        Assessment & Plan:   Problem List Items Addressed This Visit       Cardiovascular and Mediastinum   Essential hypertension   BP Readings from Last 3 Encounters:   04/26/24 114/78  04/26/24 114/78  01/11/24 (!) 143/93   Blood pressure well-controlled with current medication regimen. - Continue amlodipine  10 mg daily, carvedilol  25 mg twice daily, and olmesartan /hydrochlorothiazide  40/25 mg daily. - Encouraged heart-healthy diet with low salt and low fat. - Advised staying active, at least 30 minutes, 5 days a week.       Relevant Medications   ezetimibe  (ZETIA ) 10 MG tablet   atorvastatin  (LIPITOR) 80 MG tablet     Respiratory   Acute recurrent frontal sinusitis - Primary   possible sinusitis Augmentin  prescribed - Encouraged consistent use of Flonase .  Continue Singulair  10 mg tablet, xyzal  5mg  daily - Recommended saline nasal rinses in the morning. - Advised use of Mucinex  DM as needed for cough.       Relevant Medications   amoxicillin -clavulanate (AUGMENTIN ) 875-125 MG tablet     Endocrine   Uncontrolled type 2 diabetes mellitus with hyperglycemia, with long-term current use of insulin  (HCC)   Lab Results  Component Value Date   HGBA1C 7.8 (A) 01/11/2024   Fasting blood glucose slightly elevated. Uncertainty about atorvastatin  adherence. - Continue metformin  1000 mg twice daily, Lantus  14 units daily, and glipizide  10 mg twice daily. - Encouraged low-carb diet, avoiding sweets, sugar, soda, and flour-based foods. - Advised staying active, at least 30 minutes, 5 days a week. - Instructed to confirm atorvastatin  adherence and notify nurse via MyChart. Appreciate collaboration with the clinical pharmacist        Relevant Medications   atorvastatin  (LIPITOR) 80 MG tablet     Other   Dyslipidemia, goal LDL below 70   Lipid panel not checked due to uncertainty about atorvastatin  adherence. - Encouraged adherence to atorvastatin  80 mg daily, Zetia  10 mg daily, and fenofibrate  48 mg daily. Did not order lipid panel today since it appears that the patient has not been taking atorvastatin  consistently based on his refill history -  Advised dietary modifications to reduce fatty and fried foods.       Relevant Medications   ezetimibe  (ZETIA ) 10 MG tablet   atorvastatin  (LIPITOR) 80 MG tablet   Morbid obesity (HCC)   Wt Readings from Last 3 Encounters:  04/26/24 (!) 329 lb (149.2 kg)  01/11/24 (!) 329 lb (149.2 kg)  10/12/23 (!) 324 lb (147 kg)   Body mass index is 40.05 kg/m.   Weight management discussed for overall health improvement. - Encouraged dietary modifications and increased physical activity.       Seasonal allergies   Seasonal allergic rhinitis Intermittent symptoms with inconsistent use of allergy medications. Immunotherapy not effective. - Encouraged consistent use of Flonase  2 spray into both nostrils daily.  Singulair  10 mg daily, zxyzal 5mg  daily - Advised saline nasal rinses to alleviate congestion.       Health care maintenance   General health maintenance Discussion of shingles vaccine and eye examination. - Encouraged shingles vaccine at pharmacy. - Advised scheduling an eye examination within the next couple of months.       Meds ordered this encounter  Medications   amoxicillin -clavulanate (AUGMENTIN ) 875-125 MG tablet    Sig: Take 1 tablet by mouth 2 (two) times daily.    Dispense:  14 tablet    Refill:  0   ezetimibe  (ZETIA ) 10 MG tablet    Sig: Take 1 tablet (10 mg total) by mouth daily.    Dispense:  90 tablet    Refill:  1   atorvastatin  (LIPITOR) 80 MG tablet    Sig: Take 1 tablet (80 mg total) by mouth daily.    Dispense:  90 tablet    Refill:  2    Return in about 3 months (around 07/25/2024) for HYPERLIPIDEMIA, DM.  Myha Arizpe R Jenee Spaugh, FNP  "

## 2024-04-26 NOTE — Assessment & Plan Note (Addendum)
 Seasonal allergic rhinitis Intermittent symptoms with inconsistent use of allergy medications. Immunotherapy not effective. - Encouraged consistent use of Flonase  2 spray into both nostrils daily.  Singulair  10 mg daily, zxyzal 5mg  daily - Advised saline nasal rinses to alleviate congestion.

## 2024-04-26 NOTE — Progress Notes (Signed)
 "  04/26/2024 Name: Scott Buchanan MRN: [id number] DOB: 21-Apr-1966  Chief Complaint  Patient presents with   Diabetes   Hypertension   Hyperlipidemia    Scott Buchanan is a 59 y.o. year old male who presented for a face-to-face visit.   They were referred to the pharmacist by their PCP for assistance in managing diabetes and hypertension. PMH includes HTN, angina, asthma, MASL, T2DM, HLD,   Subjective: Patient wasseen by PCP, Scott Shall, NP on 10/12/23. BP was improved at 133/75 mmHg after olmesartan  20 mg daily was started by Cardiology (Dr. Jordan). A1C had improved from 9.6% to 8.6%. He was instructed to start Jardiance  10 mg daily - which was cost-prohibitive at the pharmacy. He was instead instructed to start Lantus  10 units daily. He was also instructed to start fenofibrate  48 mcg daily for TG of 208 mg/dL. At PCP appt on 01/11/24, BP was 143/93 mmHg. He was instructed to switch to olmesartan -hydrochlorothiazide  40-25 mg daily. A1C improved from 8.6% to 7.8%. Lantus  was increased to 14 units daily.  Patient presents today in good spirits without assistance. He reports he has good supplies of all his medications, except upon review appears he is likely out of atorvastatin . Has still not been able to get Repatha  due to cost (high deductible plan). Took BP meds around 7:30AM this morning.  Care Team: Primary Care Provider: [name], [name], FNP ; Next Scheduled Visit: 10/12/23  Medication Access/Adherence  Current Pharmacy:  [facility] [address] ([address] [place] [address] [place] ([place]) [place] [address] Phone: [phone] Fax: [phone]   Patient reports affordability concerns with their medications: Yes  - high deductible insurance plan Patient reports access/transportation concerns to their pharmacy: No  Patient reports adherence concerns with their medications:  No    Diabetes:  Current medications: metformin  1000 mg BID, glipizide  10 mg  twice daily with meals, Lantus  14 units daily  Medications tried in the past: unable to afford brand name medications with high-deductible plan  Current glucose readings:  Using glucometer - states that he purchases them OTC, likely Relion; testing 1 time daily a few times per week-  This AM 131 mg/dL, ranging 869-854 mg/dL  Patient denies hypoglycemic s/sx including dizziness, shakiness, sweating. Patient denies hyperglycemic symptoms including polyuria, polyphagia, polydipsia, nocturia, neuropathy, blurred vision.   Current meal patterns: 3 meals a day. Reports that he has increased vegetables. Attempting to stay away from white bread, replaced with wheat bread. Reports that he has switched to zero sugar sodas. Drinks 3-4 bottles of water /day. Reports that sometimes he drinks crystal light.  - Breakfast- apple - Lunch - sandwich - Snacks: blackberries, raspberries, a few grapes - usually no protein with this. Has kind breakfast bars. Greek yogurt - [employer].  - Drinks: Reports that he has switched to zero sugar sodas. Drinks 3-4 bottles of water /day. Reports that sometimes he drinks crystal light.   Current physical activity: did not discuss today  Current medication access support: none  Hypertension:  Current medications: carvedilol  25 mg BID, amlodipine  10 mg daily, olmesartan -hydrochlorothiazide  40-25 mg daily   Patient has an automated, upper arm home BP cuff. - not checking recently Current blood pressure readings readings: N/A  Patient denies hypotensive s/sx including dizziness, lightheadedness.  Patient denies hypertensive symptoms including headache, chest pain, shortness of breath  Hyperlipidemia/ASCVD Risk Reduction  Current lipid lowering medications: atorvastatin  80 mg daily, ezetimibe  10 mg daily Medications tried in the past:  Antiplatelet regimen: aspirin  81 mg daily  ASCVD History: LHC in May 2022 without evidence of CAD. Lexiscan  in 2022 with findings c/w  ischemia and prior MI and LVEF mildly decreased to 45-54%.  Family History:  Risk Factors: clinical ASCVD (premature ASCVD), T2DM, HTN, hx of tobacco use  Clinical ASCVD: Yes  The 10-year ASCVD risk score (Arnett DK, et al., 2019) is: 14.3%   Values used to calculate the score:     Age: 41 years     Clinically relevant sex: Male     Is Non-Hispanic African American: No     Diabetic: Yes     Tobacco smoker: No     Systolic Blood Pressure: 114 mmHg     Is BP treated: Yes     HDL Cholesterol: 28 mg/dL     Total Cholesterol: 165 mg/dL    Objective:  BP Readings from Last 3 Encounters:  04/26/24 114/78  04/26/24 114/78  01/11/24 (!) 143/93    Lab Results  Component Value Date   HGBA1C 7.8 (A) 01/11/2024   HGBA1C 8.6 (A) 10/12/2023   HGBA1C 9.6 (A) 07/07/2023    UACR 03/31/23: 143 mg/g  Lab Results  Component Value Date   CREATININE 0.92 01/11/2024   BUN 15 01/11/2024   NA 141 01/11/2024   K 4.0 01/11/2024   CL 96 01/11/2024   CO2 21 01/11/2024    Lab Results  Component Value Date   CHOL 165 01/11/2024   HDL 28 (L) 01/11/2024   LDLCALC 104 (H) 01/11/2024   LDLDIRECT 98 05/05/2023   TRIG 188 (H) 01/11/2024   CHOLHDL 5.9 (H) 01/11/2024    Medications Reviewed Today   Medications were not reviewed in this encounter       Assessment/Plan:   Diabetes: - Currently uncontrolled with last A1C of 7.8% above goal < 7%. Patient reports average SMBG fasting is ~130-140 mg/dL. He is not monitoring post-prandial. He is not having s/sx of hypoglycemia, but is able to verbalize management plan. He is not able to access brand name medications due to high-deductible plan. He is not a canddiate for pioglitazone due to hx of HFrEF. Will follow-up A1C to guide titration of insulin  (POC test not available today),  - UACR Dec 2024 - 143 mg/g - ordered today - Reviewed long term cardiovascular and renal outcomes of uncontrolled blood sugar - Reviewed goal A1c, goal fasting, and  goal 2 hour post prandial glucose - Reviewed dietary modifications including reducing intake of carbohydrate-heavy foods and increasing intake of protein and non-starchy vegetables.  - Reviewed lifestyle modifications including: Increasing water  intake to 5-6 bottles daily - Recommend to continue metformin  1000 mg BID, glipizide  10 mg twice daily with meals, Lantus  14 units daily - Recommend to check glucose once daily fasting and occasionally 2-hr PPG - A1C ordered today (venipuncture)   Hypertension: - Currently controlled with clinic BP below goal < 130/80 mmHg. He is tolerating ARB-thiazide combo without issue, along with amlodipine  and carvedilol . Patient is overdue for repeat BMP to monitor Scr and K after increasing dose of ARB ~3 mo ago. - Reviewed long term cardiovascular and renal outcomes of uncontrolled blood pressure - Reviewed appropriate blood pressure monitoring technique and reviewed goal blood pressure. Recommended to check home blood pressure and heart rate once daily. Instructed to bring log to next PCP appt.  - Recommend to continue carvedilol  25 mg BID, amlodipine  10 mg daily, olmesartan -hydrochlorothiazide  40-25 mg daily  - Obtained BMP, UACR today   Hyperlipidemia/ASCVD  Risk Reduction: - Currently uncontrolled with last LDL-C of 104 mg/dL above goal < 55 mg/dL given premature ASCVD (findings of ischemia on Lexiscan  in 2022) after 3 months of therapy with ezetimibe  and atorvastatin . Will ensure [facility] can refill atorvastatin  today given discrepancy in fill hx. PCSK9i has not been accessible due to high deductible plan, but will reinvestigate cost today. - Reviewed long term complications of uncontrolled cholesterol - Recommend to continue atorvastatin  80 mg daily, ezetimibe  10 mg daily  - Will collaborate with patient advocate team to resubmit PA for Repatha  - Called [facility] to facilitate refill of atorvastatin .  Follow Up Plan: PCP 10/12/23, PharmD telephone  11/16/23  Lorain Baseman, PharmD [facility] [phone]     "

## 2024-04-27 ENCOUNTER — Other Ambulatory Visit: Payer: Self-pay

## 2024-04-27 ENCOUNTER — Ambulatory Visit: Payer: Self-pay

## 2024-04-27 ENCOUNTER — Other Ambulatory Visit: Payer: Self-pay | Admitting: Nurse Practitioner

## 2024-04-27 DIAGNOSIS — I10 Essential (primary) hypertension: Secondary | ICD-10-CM

## 2024-04-27 DIAGNOSIS — J302 Other seasonal allergic rhinitis: Secondary | ICD-10-CM

## 2024-04-27 DIAGNOSIS — E785 Hyperlipidemia, unspecified: Secondary | ICD-10-CM

## 2024-04-27 LAB — HEMOGLOBIN A1C
Est. average glucose Bld gHb Est-mCnc: 197 mg/dL
Hgb A1c MFr Bld: 8.5 % — ABNORMAL HIGH (ref 4.8–5.6)

## 2024-04-27 LAB — BASIC METABOLIC PANEL WITH GFR
BUN/Creatinine Ratio: 15 (ref 9–20)
BUN: 18 mg/dL (ref 6–24)
CO2: 19 mmol/L — ABNORMAL LOW (ref 20–29)
Calcium: 10.3 mg/dL — ABNORMAL HIGH (ref 8.7–10.2)
Chloride: 102 mmol/L (ref 96–106)
Creatinine, Ser: 1.18 mg/dL (ref 0.76–1.27)
Glucose: 142 mg/dL — ABNORMAL HIGH (ref 70–99)
Potassium: 5.3 mmol/L — ABNORMAL HIGH (ref 3.5–5.2)
Sodium: 141 mmol/L (ref 134–144)
eGFR: 72 mL/min/1.73

## 2024-04-27 LAB — MICROALBUMIN / CREATININE URINE RATIO
Creatinine, Urine: 239.4 mg/dL
Microalb/Creat Ratio: 26 mg/g{creat} (ref 0–29)
Microalbumin, Urine: 62.2 ug/mL

## 2024-04-27 MED ORDER — ATORVASTATIN CALCIUM 80 MG PO TABS: 80.0000 mg | ORAL_TABLET | Freq: Every day | ORAL | 2 refills | Status: AC

## 2024-04-27 MED ORDER — OLMESARTAN MEDOXOMIL-HCTZ 20-12.5 MG PO TABS: 1.0000 | ORAL_TABLET | Freq: Every day | ORAL | 1 refills | Status: AC

## 2024-04-27 NOTE — Telephone Encounter (Signed)
 fluticasone  (FLONASE ) 50 MCG/ACT nasal spray [Pharmacy Med Name: Fluticasone  Propionate 50 MCG/ACT Nasal Suspension]

## 2024-04-27 NOTE — Telephone Encounter (Signed)
 Pharmacy Patient Advocate Encounter  Received notification from [facility] that Prior Authorization for REPATHA  has been DENIED.  Full denial letter will be uploaded to the media tab. See denial reason below.   PA #/Case ID/Reference #: [id number]  We denied the medical services/items listed above because: Proprotein Convertase Subtilisin/kexin 9 (PCSK9) Inhibitors criteria has not been met. To meet  our criteria, your doctor must tell us :  Your doctor is a cardiologist (heart doctor) or specialist in treating lipid disorders.

## 2024-05-02 ENCOUNTER — Ambulatory Visit: Admitting: Nurse Practitioner

## 2024-05-09 ENCOUNTER — Other Ambulatory Visit: Payer: Self-pay

## 2024-05-09 DIAGNOSIS — I10 Essential (primary) hypertension: Secondary | ICD-10-CM

## 2024-05-10 ENCOUNTER — Other Ambulatory Visit: Payer: Self-pay | Admitting: Nurse Practitioner

## 2024-05-10 ENCOUNTER — Ambulatory Visit: Payer: Self-pay

## 2024-05-10 DIAGNOSIS — E781 Pure hyperglyceridemia: Secondary | ICD-10-CM

## 2024-05-10 LAB — BASIC METABOLIC PANEL WITH GFR
BUN/Creatinine Ratio: 19 (ref 9–20)
BUN: 20 mg/dL (ref 6–24)
CO2: 20 mmol/L (ref 20–29)
Calcium: 10.1 mg/dL (ref 8.7–10.2)
Chloride: 104 mmol/L (ref 96–106)
Creatinine, Ser: 1.06 mg/dL (ref 0.76–1.27)
Glucose: 150 mg/dL — ABNORMAL HIGH (ref 70–99)
Potassium: 5.1 mmol/L (ref 3.5–5.2)
Sodium: 141 mmol/L (ref 134–144)
eGFR: 82 mL/min/1.73

## 2024-05-10 NOTE — Telephone Encounter (Signed)
 fenofibrate  (TRICOR ) 48 MG tablet [Pharmacy Med Name: Fenofibrate  48 MG Oral Tablet]

## 2024-05-15 ENCOUNTER — Other Ambulatory Visit: Payer: Self-pay | Admitting: Nurse Practitioner

## 2024-05-15 DIAGNOSIS — E669 Obesity, unspecified: Secondary | ICD-10-CM

## 2024-05-20 ENCOUNTER — Other Ambulatory Visit: Payer: Self-pay

## 2024-05-20 DIAGNOSIS — E119 Type 2 diabetes mellitus without complications: Secondary | ICD-10-CM

## 2024-05-20 DIAGNOSIS — E1165 Type 2 diabetes mellitus with hyperglycemia: Secondary | ICD-10-CM

## 2024-05-20 MED ORDER — INSULIN GLARGINE 100 UNIT/ML SOLOSTAR PEN: 20.0000 [IU] | PEN_INJECTOR | Freq: Every day | SUBCUTANEOUS | 11 refills | Status: AC

## 2024-05-20 MED ORDER — METFORMIN HCL 1000 MG PO TABS: 1000.0000 mg | ORAL_TABLET | Freq: Two times a day (BID) | ORAL | 1 refills | Status: AC

## 2024-05-20 NOTE — Progress Notes (Signed)
 "  05/20/2024 Name: Scott Buchanan MRN: [id number] DOB: 1965-10-08  Chief Complaint  Patient presents with   Diabetes   Hypertension   Hyperlipidemia    Scott Buchanan is a 59 y.o. year old male who presented for a telephone visit.   They were referred to the pharmacist by their PCP for assistance in managing diabetes and hypertension. PMH includes HTN, angina, asthma, MASL, T2DM, HLD,   Subjective: Patient wasseen by PCP, [name], NP on 04/26/24. A1C had worsened from 7.8% to 8.5%. He was instructed to increase Lantus  to 20 units daily. BP was controlled at 114/78 mmHg at OV, but potassium was slightly elevated at 5.3 mEq/L. We decreased olmesartan -hydrochlorothiazide  to 20-12.5 mg daily and K improved to 5.1 mEq/L. We also resubmitted PA for Repatha , but it was denied, as insurance requires the Rx comes from a cardiologist. Brand name medications have been unaffordable, due to patient having a high deductible plan.  Today, patient reports he has been very busy at work. Did increase Lantus  to 20 units daily, but has not been checking blood sugars or blood pressure.   Care Team: Primary Care Provider: [name], [name], FNP ; Next Scheduled Visit: 10/12/23  Medication Access/Adherence  Current Pharmacy:  [facility] [address] ([address]), [place] - [address] [place] ([place]) [place] [address] Phone: [phone] Fax: [phone]   Patient reports affordability concerns with their medications: Yes  - high deductible insurance plan Patient reports access/transportation concerns to their pharmacy: No  Patient reports adherence concerns with their medications:  No    Diabetes:  Current medications: metformin  1000 mg BID, glipizide  10 mg twice daily with meals, Lantus  20 units daily  Medications tried in the past: unable to afford brand name medications with high-deductible plan  Current glucose readings:  Using glucometer - states that he purchases them  OTC, likely [employer]; usually testing 1 time daily a few times per week, but has not checked since increasing Lantus  dose.  Patient denies hypoglycemic s/sx including dizziness, shakiness, sweating. Patient denies hyperglycemic symptoms including polyuria, polyphagia, polydipsia, nocturia, neuropathy, blurred vision.   Current meal patterns (as reported at previous visit): 3 meals a day. Reports that he has increased vegetables. Attempting to stay away from white bread, replaced with wheat bread. Reports that he has switched to zero sugar sodas. Drinks 3-4 bottles of water /day. Reports that sometimes he drinks crystal light.  - Breakfast- apple - Lunch - sandwich - Snacks: blackberries, raspberries, a few grapes - usually no protein with this. Has kind breakfast bars. Greek yogurt - [employer].  - Drinks: Reports that he has switched to zero sugar sodas. Drinks 3-4 bottles of water /day. Reports that sometimes he drinks crystal light.   Current physical activity: did not discuss today  Current medication access support: none  Hypertension:  Current medications: carvedilol  25 mg BID, amlodipine  10 mg daily, olmesartan -hydrochlorothiazide  20-12.5 mg daily   Patient has an automated, upper arm home BP cuff. - not checking recently Current blood pressure readings readings: N/A  Patient denies hypotensive s/sx including dizziness, lightheadedness.  Patient denies hypertensive symptoms including headache, chest pain, shortness of breath  Hyperlipidemia/ASCVD Risk Reduction  Current lipid lowering medications: atorvastatin  80 mg daily, ezetimibe  10 mg daily Medications tried in the past:   Antiplatelet regimen: aspirin  81 mg daily  ASCVD History: LHC in May 2022 without evidence of CAD. Lexiscan  in April 2022 with findings c/w ischemia and prior MI and LVEF mildly decreased to  45-54%.  Family History:  Risk Factors: clinical ASCVD (premature ASCVD), T2DM, HTN, hx of tobacco use  Clinical ASCVD:  Yes  The 10-year ASCVD risk score (Arnett DK, et al., 2019) is: 14.3%   Values used to calculate the score:     Age: 43 years     Clinically relevant sex: Male     Is Non-Hispanic African American: No     Diabetic: Yes     Tobacco smoker: No     Systolic Blood Pressure: 114 mmHg     Is BP treated: Yes     HDL Cholesterol: 28 mg/dL     Total Cholesterol: 165 mg/dL    Objective:  BP Readings from Last 3 Encounters:  04/26/24 114/78  04/26/24 114/78  01/11/24 (!) 143/93    Lab Results  Component Value Date   HGBA1C 8.5 (H) 04/26/2024   HGBA1C 7.8 (A) 01/11/2024   HGBA1C 8.6 (A) 10/12/2023    UACR 03/31/23: 143 mg/g  Lab Results  Component Value Date   CREATININE 1.06 05/09/2024   BUN 20 05/09/2024   NA 141 05/09/2024   K 5.1 05/09/2024   CL 104 05/09/2024   CO2 20 05/09/2024    Lab Results  Component Value Date   CHOL 165 01/11/2024   HDL 28 (L) 01/11/2024   LDLCALC 104 (H) 01/11/2024   LDLDIRECT 98 05/05/2023   TRIG 188 (H) 01/11/2024   CHOLHDL 5.9 (H) 01/11/2024    Medications Reviewed Today     Reviewed by Brinda Lorain SQUIBB, RPH-CPP (Pharmacist) on 05/20/24 at 0913  Med List Status: <None>   Medication Order Taking? Sig Documenting Provider Last Dose Status Informant  acetaminophen  (TYLENOL ) 500 MG tablet [id number]  Take 500 mg by mouth every 6 (six) hours as needed for headache, mild pain (pain score 1-3) or moderate pain (pain score 4-6). [provider]  Active   albuterol  (VENTOLIN  HFA) 108 (90 Base) MCG/ACT inhaler [id number]  Inhale 2 puffs into the lungs every 4 (four) hours as needed for wheezing or shortness of breath. [name] HERO, FNP  Active Self  amLODipine  (NORVASC ) 10 MG tablet [id number] Yes Take 1 tablet (10 mg total) by mouth daily. [name], [name], FNP  Active     Discontinued 05/20/24 [phone] (Completed Course)   Ascorbic Acid  (VITAMIN C ) 1000 MG tablet [id number]  Take 2,000 mg by mouth daily. [provider]  Active  Self  aspirin  EC 81 MG tablet [id number] Yes Take 81 mg by mouth daily. [provider]  Active Self  atorvastatin  (LIPITOR) 80 MG tablet [id number] Yes Take 1 tablet (80 mg total) by mouth daily. [name], [name], FNP  Active   carvedilol  (COREG ) 25 MG tablet [id number] Yes Take 1 tablet (25 mg total) by mouth 2 (two) times daily with a meal. [name], [name], FNP  Active   cholecalciferol  (VITAMIN D ) 1000 units tablet [id number]  Take 2,000 Units by mouth daily. [provider]  Active Self  EPINEPHrine  0.3 mg/0.3 mL IJ SOAJ injection [id number]  Inject 0.3 mg into the muscle as needed for anaphylaxis. Luke Orlan HERO, DO  Active            Med Note MAYER, [facility] R   Tue Jul 21, 2023  2:49 PM) Expired 11/2022 per patient by looking at the EpiPen    Patient not taking:   Discontinued 05/20/24 0911 (Cost of medication)            Med Note>> Brinda Lorain SQUIBB,  RPH-CPP   05/20/2024  9:11 AM insurance denied (requires Rx to come from cardiologist)    ezetimibe  (ZETIA ) 10 MG tablet [id number] Yes Take 1 tablet (10 mg total) by mouth daily. [name], [name], FNP  Active   fenofibrate  (TRICOR ) 48 MG tablet [id number] Yes Take 1 tablet by mouth once daily [name], [name], FNP  Active   fluticasone  (FLONASE ) 50 MCG/ACT nasal spray [id number]  Use 2 spray(s) in each nostril once daily [name], [name], FNP  Active   gabapentin  (NEURONTIN ) 300 MG capsule [id number]  Take 1 capsule (300 mg total) by mouth at bedtime. [name], [name], FNP  Active   glipiZIDE  (GLUCOTROL  XL) 10 MG 24 hr tablet [id number] Yes Take 1 tablet (10 mg total) by mouth 2 (two) times daily with a meal. [name], [name], FNP  Active   insulin  glargine (LANTUS ) 100 UNIT/ML Solostar Pen [id number]  Inject 14 Units into the skin daily. [name], [name], FNP  Active   Insulin  Pen Needle 32G X 4 MM MISC [id number]  Use to inject insulin  once daily as directed [name], [name], FNP  Active   levocetirizine (XYZAL ) 5 MG  tablet [id number]  TAKE 1 TABLET BY MOUTH ONCE DAILY IN THE EVENING [name], [name], FNP  Active   metFORMIN  (GLUCOPHAGE ) 1000 MG tablet [id number] Yes Take 1 tablet (1,000 mg total) by mouth 2 (two) times daily with a meal. [name], [name], FNP  Active   montelukast  (SINGULAIR ) 10 MG tablet [id number] Yes Take 1 tablet (10 mg total) by mouth at bedtime. [name], [name], FNP  Active   olmesartan -hydrochlorothiazide  (BENICAR  HCT) 20-12.5 MG tablet [id number] Yes Take 1 tablet by mouth daily. [name], [name], FNP  Active   omeprazole  (PRILOSEC) 40 MG capsule [id number] Yes Take 1 capsule by mouth once daily [name], [name], FNP  Active   oxybutynin  (DITROPAN -XL) 10 MG 24 hr tablet [id number] Yes Take 1 tablet by mouth once daily [name], [name], FNP  Active   sodium chloride  (OCEAN) 0.65 % SOLN nasal spray [id number]  Place 1 spray into both nostrils as needed. [name], [name], FNP  Active            Med Note MAYER, [facility] R   Tue Jul 21, 2023  2:55 PM) Taking PRN daily  tamsulosin  (FLOMAX ) 0.4 MG CAPS capsule [id number] Yes TAKE 1 CAPSULE BY MOUTH ONCE DAILY [name], [name], FNP  Active               Assessment/Plan:   Diabetes: - Currently uncontrolled with last A1C of 8.5% above goal < 7%, and worsened from 7.8%. No SMBG data available today to assess efficacy of higher dose of basal insulin . He is not having s/sx of hypoglycemia, but is able to verbalize management plan. He is not able to access brand name medications due to high-deductible plan. He is not a canddiate for pioglitazone due to possible hx of HFrEF (on Lexiscan  from 2022).  - UACR Jan 2026 - 26 mg/g - Reviewed long term cardiovascular and renal outcomes of uncontrolled blood sugar - Reviewed goal A1c, goal fasting, and goal 2 hour post prandial glucose - Reviewed dietary modifications including reducing intake of carbohydrate-heavy foods and increasing intake of protein and non-starchy vegetables.   - Reviewed lifestyle modifications including: Increasing water  intake to 5-6 bottles daily - Recommend to continue metformin  1000 mg BID, glipizide  10 mg twice daily with meals, Lantus  20 units daily - Recommend to check glucose  once daily fasting and occasionally 2-hr PPG - A1C due 07/25/24   Hypertension: - Currently controlled with clinic BP below goal < 130/80 mmHg. He has not checked home BP since decreasing olmesartan -hydrochlorothiazide  dose due to hyperkalemia. Will continue to monitor. - Reviewed long term cardiovascular and renal outcomes of uncontrolled blood pressure - Reviewed appropriate blood pressure monitoring technique and reviewed goal blood pressure. Recommended to check home blood pressure and heart rate once daily.  - Recommend to continue carvedilol  25 mg BID, amlodipine  10 mg daily, olmesartan -hydrochlorothiazide  20-12.5 mg daily    Hyperlipidemia/ASCVD Risk Reduction: - Currently uncontrolled with last LDL-C of 104 mg/dL above goal < 55 mg/dL given premature ASCVD (findings of ischemia on Lexiscan  in 2022) after 3 months of therapy with ezetimibe  and atorvastatin . PSCKi not accessible due to high-deductible plan, and insurance requiring Rx to come from cardiologist. - Reviewed long term complications of uncontrolled cholesterol - Recommend to continue atorvastatin  80 mg daily, ezetimibe  10 mg daily   Follow Up Plan: PCP 07/25/24, PharmD telephone 06/06/24  Lorain Baseman, PharmD [facility] [phone] "

## 2024-06-06 ENCOUNTER — Other Ambulatory Visit: Payer: Self-pay

## 2024-07-25 ENCOUNTER — Ambulatory Visit: Payer: Self-pay | Admitting: Nurse Practitioner
